# Patient Record
Sex: Female | Born: 1990 | Race: Black or African American | Hispanic: No | Marital: Single | Smoking: Never smoker
Health system: Southern US, Community
[De-identification: ages and names within clinical notes are randomized; demographics above are authoritative.]

## PROBLEM LIST (undated history)

## (undated) ENCOUNTER — Inpatient Hospital Stay (HOSPITAL_COMMUNITY): Payer: Self-pay

## (undated) ENCOUNTER — Ambulatory Visit (HOSPITAL_COMMUNITY): Source: Home / Self Care

## (undated) DIAGNOSIS — F909 Attention-deficit hyperactivity disorder, unspecified type: Secondary | ICD-10-CM

## (undated) DIAGNOSIS — J45909 Unspecified asthma, uncomplicated: Secondary | ICD-10-CM

## (undated) DIAGNOSIS — A549 Gonococcal infection, unspecified: Secondary | ICD-10-CM

## (undated) DIAGNOSIS — A749 Chlamydial infection, unspecified: Secondary | ICD-10-CM

## (undated) DIAGNOSIS — B9689 Other specified bacterial agents as the cause of diseases classified elsewhere: Secondary | ICD-10-CM

## (undated) DIAGNOSIS — K219 Gastro-esophageal reflux disease without esophagitis: Secondary | ICD-10-CM

## (undated) DIAGNOSIS — R519 Headache, unspecified: Secondary | ICD-10-CM

## (undated) DIAGNOSIS — R1110 Vomiting, unspecified: Secondary | ICD-10-CM

## (undated) DIAGNOSIS — N760 Acute vaginitis: Secondary | ICD-10-CM

## (undated) HISTORY — PX: NO PAST SURGERIES: SHX2092

---
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
## 2011-01-06 ENCOUNTER — Inpatient Hospital Stay (INDEPENDENT_AMBULATORY_CARE_PROVIDER_SITE_OTHER)
Admission: RE | Admit: 2011-01-06 | Discharge: 2011-01-06 | Disposition: A | Discharge status: Home / Self Care | Payer: Medicaid Other | Source: Ambulatory Visit | Attending: Family Medicine | Admitting: Family Medicine

## 2011-01-06 ENCOUNTER — Ambulatory Visit (INDEPENDENT_AMBULATORY_CARE_PROVIDER_SITE_OTHER): Payer: Medicaid Other

## 2011-01-06 DIAGNOSIS — S61209A Unspecified open wound of unspecified finger without damage to nail, initial encounter: Secondary | ICD-10-CM

## 2011-04-20 ENCOUNTER — Inpatient Hospital Stay (INDEPENDENT_AMBULATORY_CARE_PROVIDER_SITE_OTHER)
Admission: RE | Admit: 2011-04-20 | Discharge: 2011-04-20 | Disposition: A | Discharge status: Home / Self Care | Payer: Medicaid Other | Source: Ambulatory Visit | Attending: Family Medicine | Admitting: Family Medicine

## 2011-04-20 DIAGNOSIS — H6120 Impacted cerumen, unspecified ear: Secondary | ICD-10-CM

## 2011-04-20 DIAGNOSIS — I809 Phlebitis and thrombophlebitis of unspecified site: Secondary | ICD-10-CM

## 2011-04-20 DIAGNOSIS — R609 Edema, unspecified: Secondary | ICD-10-CM

## 2011-04-20 DIAGNOSIS — T169XXA Foreign body in ear, unspecified ear, initial encounter: Secondary | ICD-10-CM

## 2011-04-20 LAB — POCT I-STAT, CHEM 8
Calcium, Ion: 1.13 mmol/L (ref 1.12–1.32)
HCT: 46 % (ref 36.0–46.0)
TCO2: 25 mmol/L (ref 0–100)

## 2012-04-25 ENCOUNTER — Emergency Department (HOSPITAL_COMMUNITY)
Admission: EM | Admit: 2012-04-25 | Discharge: 2012-04-25 | Disposition: A | Discharge status: Home / Self Care | Payer: Self-pay | Attending: Emergency Medicine | Admitting: Emergency Medicine

## 2012-04-25 ENCOUNTER — Emergency Department (HOSPITAL_COMMUNITY): Payer: Self-pay

## 2012-04-25 DIAGNOSIS — E860 Dehydration: Secondary | ICD-10-CM | POA: Insufficient documentation

## 2012-04-25 DIAGNOSIS — R55 Syncope and collapse: Secondary | ICD-10-CM | POA: Insufficient documentation

## 2012-04-25 DIAGNOSIS — S0990XA Unspecified injury of head, initial encounter: Secondary | ICD-10-CM | POA: Insufficient documentation

## 2012-04-25 DIAGNOSIS — W1809XA Striking against other object with subsequent fall, initial encounter: Secondary | ICD-10-CM | POA: Insufficient documentation

## 2012-04-25 LAB — URINALYSIS, ROUTINE W REFLEX MICROSCOPIC
Ketones, ur: NEGATIVE mg/dL
Leukocytes, UA: NEGATIVE
Nitrite: NEGATIVE
Protein, ur: NEGATIVE mg/dL
Urobilinogen, UA: 0.2 mg/dL (ref 0.0–1.0)

## 2012-04-25 LAB — BASIC METABOLIC PANEL
BUN: 8 mg/dL (ref 6–23)
GFR calc Af Amer: >90 mL/min (ref >90)
GFR calc non Af Amer: >90 mL/min (ref >90)
Potassium: 3.4 mEq/L — ABNORMAL LOW (ref 3.5–5.1)
Sodium: 137 mEq/L (ref 135–145)

## 2012-04-25 LAB — CBC WITH DIFFERENTIAL/PLATELET
Basophils Relative: 0 % (ref 0–1)
Eosinophils Absolute: 0.3 10*3/uL (ref 0.0–0.7)
MCH: 27.4 pg (ref 26.0–34.0)
MCHC: 33.6 g/dL (ref 30.0–36.0)
Monocytes Relative: 5 % (ref 3–12)
Neutrophils Relative %: 46 % (ref 43–77)
Platelets: 295 10*3/uL (ref 150–400)

## 2012-04-25 MED ORDER — ACETAMINOPHEN 325 MG PO TABS
650.0000 mg | ORAL_TABLET | Freq: Once | ORAL | Status: AC
  Administered 2012-04-25: 650 mg via ORAL
  Filled 2012-04-25: qty 2

## 2012-04-25 MED ORDER — ONDANSETRON 8 MG PO TBDP
8.0000 mg | ORAL_TABLET | Freq: Once | ORAL | Status: AC
  Administered 2012-04-25: 8 mg via ORAL
  Filled 2012-04-25: qty 1

## 2012-04-25 NOTE — ED Notes (Signed)
Pt at home, sitting on the patio outside, Passed out and hit the back of her head on concrete. LOC briefly. Pt alert upon [facility] arrival. Alert and oriented x 4. No hematoma or active bleeding found by EMS. VS: 142/76, HR 88 NSR, RR 16. Pt reports decrease in fluid intake and has only eaten one meal around 4pm yesterday. Pt is  Allergic to shellfish.

## 2012-04-25 NOTE — ED Notes (Signed)
AVW:[id number]> Expected date:<BR> Expected time:<BR> Means of arrival:<BR> Comments:<BR> EMS/21 year old-syncopal episode-fell and hit head

## 2012-04-25 NOTE — ED Provider Notes (Addendum)
History     CSN: [id number]  Arrival date & time 04/25/12  0010   First MD Initiated Contact with Patient 04/25/12 0018      Chief Complaint  Patient presents with  . Loss of Consciousness    (Consider location/radiation/quality/duration/timing/severity/associated sxs/prior treatment) HPI 21 year old female presents to emergency room complaining of syncope. Patient reports after returning to the movies, she was carrying in her nephew when she suddenly felt weak. Another family member took the child from her, she then reports she had episode of syncope where she fell forward onto her knees and then hit her head on the floor. She reports waking up with the ambulance crew around her. Patient reports prior history of syncope in relation to dehydration. Patient reports over the last 24 hours she's only eaten once. Family reports she's not been drinking that well either. Patient currently complaining of headache and slight nausea. She reports she is hungry. Patient reports she had onset of headache today around 3:30/4:00, and tried to eat something at that time which did help her headache somewhat.  Patient denies chest pain, shortness of breath neck pain, abdominal pain dysuria. Her last menstrual period was 2-3 years ago. Patient has contraceptive implant in place and does not have current menses.  No past medical history on file.  No past surgical history on file.  No family history on file.  History  Substance Use Topics  . Smoking status: Not on file  . Smokeless tobacco: Not on file  . Alcohol Use: Not on file    OB History    No data available      Review of Systems  All other systems reviewed and are negative.    Allergies  Shellfish allergy  Home Medications   Current Outpatient Rx  Name Route Sig Dispense Refill  . DOXYCYCLINE HYCLATE 100 MG PO TABS Oral Take 100 mg by mouth 2 (two) times daily.    . ETONOGESTREL 68 MG [place] IMPL Subcutaneous Inject 1 each into the  skin once.      BP 131/77  Pulse 99  Temp 98.7 F (37.1 C) (Oral)  Resp 16  Ht 5\' 6"  (1.676 m)  Wt 210 lb (95.255 kg)  BMI 33.89 kg/m2  SpO2 100%  Physical Exam  Nursing note and vitals reviewed. Constitutional: She is oriented to person, place, and time. She appears well-developed and well-nourished.  HENT:  Head: Normocephalic and atraumatic.  Nose: Nose normal.       Dry mucous membranes noted  Eyes: Conjunctivae and EOM are normal. Pupils are equal, round, and reactive to light.  Neck: Normal range of motion. Neck supple. No JVD present. No tracheal deviation present. No thyromegaly present.       Patient in c-collar, CT scan negative, collar replaced and patient with normal range of motion movement without difficulty  Cardiovascular: Normal rate, regular rhythm, normal heart sounds and intact distal pulses.  Exam reveals no gallop and no friction rub.   No murmur heard. Pulmonary/Chest: Effort normal and breath sounds normal. No stridor. No respiratory distress. She has no wheezes. She has no rales. She exhibits no tenderness.  Abdominal: Soft. Bowel sounds are normal. She exhibits no distension and no mass. There is no tenderness. There is no rebound and no guarding.  Musculoskeletal: Normal range of motion. She exhibits no edema and no tenderness.  Lymphadenopathy:    She has no cervical adenopathy.  Neurological: She is alert and oriented to person, place, and time. She  has normal reflexes. No cranial nerve deficit. She exhibits normal muscle tone. Coordination normal.  Skin: Skin is dry. No rash noted. No erythema. No pallor.  Psychiatric: She has a normal mood and affect. Her behavior is normal. Judgment and thought content normal.    ED Course  Procedures (including critical care time)  Labs Reviewed  URINALYSIS, ROUTINE W REFLEX MICROSCOPIC - Abnormal; Notable for the following:    APPearance CLOUDY (*)     All other components within normal limits  PREGNANCY,  URINE  CBC WITH DIFFERENTIAL  BASIC METABOLIC PANEL   Ct Head Wo Contrast  04/25/2012  *RADIOLOGY REPORT*  Clinical Data:  Syncope with loss of consciousness and head injury.  CT HEAD WITHOUT CONTRAST CT CERVICAL SPINE WITHOUT CONTRAST  Technique:  Multidetector CT imaging of the head and cervical spine was performed following the standard protocol without intravenous contrast.  Multiplanar CT image reconstructions of the cervical spine were also generated.  Comparison:   None  CT HEAD  Findings: The brain demonstrates no evidence of hemorrhage, infarction, edema, mass effect, extra-axial fluid collection, hydrocephalus or mass lesion.  The skull is unremarkable.  IMPRESSION: Normal CT of head.  CT CERVICAL SPINE  Findings: The cervical spine shows normal alignment and no evidence of fracture or subluxation.  No significant degenerative changes. No soft tissue swelling.  The visualized airway is normally patent.  IMPRESSION: Normal CT of cervical spine.  Original Report Authenticated By: Reola Calkins, M.D.   Ct Cervical Spine Wo Contrast  04/25/2012  *RADIOLOGY REPORT*  Clinical Data:  Syncope with loss of consciousness and head injury.  CT HEAD WITHOUT CONTRAST CT CERVICAL SPINE WITHOUT CONTRAST  Technique:  Multidetector CT imaging of the head and cervical spine was performed following the standard protocol without intravenous contrast.  Multiplanar CT image reconstructions of the cervical spine were also generated.  Comparison:   None  CT HEAD  Findings: The brain demonstrates no evidence of hemorrhage, infarction, edema, mass effect, extra-axial fluid collection, hydrocephalus or mass lesion.  The skull is unremarkable.  IMPRESSION: Normal CT of head.  CT CERVICAL SPINE  Findings: The cervical spine shows normal alignment and no evidence of fracture or subluxation.  No significant degenerative changes. No soft tissue swelling.  The visualized airway is normally patent.  IMPRESSION: Normal CT of  cervical spine.  Original Report Authenticated By: Reola Calkins, M.D.    Date: 04/25/2012  Rate: 80  Rhythm: normal sinus rhythm  QRS Axis: normal  Intervals: normal  ST/T Wave abnormalities: normal  Conduction Disutrbances:none  Narrative Interpretation:   Old EKG Reviewed: none available    1. Syncope   2. Dehydration       MDM  21 year old female with syncope. Workup here unremarkable, patient feeling better. Patient does appear to have mild dehydration on exam and has not eaten well. Patient encouraged to eat and drink regularly especially with the hot weather.        Olivia Mackie, MD 04/25/12 7829  Olivia Mackie, MD 04/25/12 [phone]

## 2013-02-13 ENCOUNTER — Encounter: Payer: Self-pay | Admitting: Obstetrics

## 2013-02-13 ENCOUNTER — Ambulatory Visit (INDEPENDENT_AMBULATORY_CARE_PROVIDER_SITE_OTHER): Payer: BC Managed Care – PPO | Admitting: Obstetrics

## 2013-02-13 VITALS — BP 104/66 | HR 91 | Temp 97.3°F | Wt 202.0 lb

## 2013-02-13 DIAGNOSIS — L739 Follicular disorder, unspecified: Secondary | ICD-10-CM | POA: Insufficient documentation

## 2013-02-13 DIAGNOSIS — Z3200 Encounter for pregnancy test, result unknown: Secondary | ICD-10-CM

## 2013-02-13 DIAGNOSIS — R3915 Urgency of urination: Secondary | ICD-10-CM

## 2013-02-13 DIAGNOSIS — Z01419 Encounter for gynecological examination (general) (routine) without abnormal findings: Secondary | ICD-10-CM

## 2013-02-13 DIAGNOSIS — Z113 Encounter for screening for infections with a predominantly sexual mode of transmission: Secondary | ICD-10-CM

## 2013-02-13 DIAGNOSIS — Z309 Encounter for contraceptive management, unspecified: Secondary | ICD-10-CM

## 2013-02-13 DIAGNOSIS — N760 Acute vaginitis: Secondary | ICD-10-CM

## 2013-02-13 DIAGNOSIS — L738 Other specified follicular disorders: Secondary | ICD-10-CM

## 2013-02-13 HISTORY — DX: Follicular disorder, unspecified: L73.9

## 2013-02-13 NOTE — Progress Notes (Signed)
Subjective:    Deanna Myers is a 22 y.o. female who presents for contraception counseling. The patient has no complaints today. The patient is sexually active. Pertinent past medical history: sexually transmitted diseases.  Menstrual History: OB History   Grav Para Term Preterm Abortions TAB SAB Ect Mult Living                  Menarche age: 43  No LMP recorded. Patient has had an implant.    The following portions of the patient's history were reviewed and updated as appropriate: allergies, current medications, past family history, past medical history, past social history, past surgical history and problem list.  Review of Systems Pertinent items are noted in HPI.   Objective:    General appearance: alert and no distress Breasts: normal appearance, no masses or tenderness Abdomen: normal findings: soft, non-tender Pelvic: cervix normal in appearance, external genitalia normal, no adnexal masses or tenderness, no cervical motion tenderness, uterus normal size, shape, and consistency and vagina normal without discharge   Assessment:    22 y.o., continuing Depo-Provera injections, no contraindications.   Plan:    All questions answered.  Schedule removal.

## 2013-02-14 ENCOUNTER — Encounter: Payer: Self-pay | Admitting: Obstetrics

## 2013-02-14 LAB — PAP IG W/ RFLX HPV ASCU

## 2013-02-14 LAB — WET PREP BY MOLECULAR PROBE: Candida species: NEGATIVE

## 2013-02-20 ENCOUNTER — Encounter: Payer: Self-pay | Admitting: Obstetrics

## 2013-02-20 ENCOUNTER — Ambulatory Visit (INDEPENDENT_AMBULATORY_CARE_PROVIDER_SITE_OTHER): Payer: BC Managed Care – PPO | Admitting: Obstetrics

## 2013-02-20 VITALS — BP 117/79 | HR 83 | Temp 98.6°F | Wt 203.0 lb

## 2013-02-20 DIAGNOSIS — Z3046 Encounter for surveillance of implantable subdermal contraceptive: Secondary | ICD-10-CM

## 2013-02-20 NOTE — Patient Instructions (Signed)
Routine

## 2013-02-20 NOTE — Progress Notes (Signed)
Nexplanon Removal:      Rod palpated and anesthetized at the tip with 1% plain xylocaine.  The skin sterilely prepped and an incision made over the tip, and the rod was grasped with       A mosquito forcep and the rod removed, intact.  Skin closed with one interrupted suture of 4-0 Vicryl.  Bandage applied.

## 2013-02-20 NOTE — Progress Notes (Deleted)
IMPLANON REMOVAL NOTE  Pt had device inserted 09/ 2012  Contraception used: Implanon  Pregnancy test result:  Lab Results  Component Value Date   PREGTESTUR NEGATIVE 04/25/2012    Indications:  The patient desires contraception.  She understands risks, benefits, and alternatives to Implanon and would like to proceed.  Anesthesia:   Lidocaine 1% plain.  Procedure:  A time-out was performed confirming the procedure and the patient's allergy status.  The patient's non-dominant was identified as the {left/right:311354} arm.  The protection cap was removed. While placing countertraction on the skin, the needle was inserted at a 30 degree angle.  The applicator was held horizontal to the skin; the skin was tented upward as the needle was introduced into the subdermal space.  While holding the applicator in place, the slider was unlocked. The Nexplanon was removed from the field.  The Nexplanon was palpated to ensure proper placement.  Complications: {Mis complications:31741}  Instructions:  The patient was instructed to remove the dressing in 24 hours and that some bruising is to be expected.  She was advised to use over the counter analgesics as needed for any pain at the site.  She is to keep the area dry for 24 hours and to call if her hand or arm becomes cold, numb, or blue.  Return visit:  Return in {1-6 weeks:20341}

## 2013-03-06 ENCOUNTER — Ambulatory Visit: Payer: BC Managed Care – PPO | Admitting: Obstetrics

## 2013-03-06 ENCOUNTER — Encounter: Payer: Self-pay | Admitting: *Deleted

## 2013-03-13 ENCOUNTER — Ambulatory Visit: Payer: BC Managed Care – PPO

## 2013-06-05 ENCOUNTER — Inpatient Hospital Stay (HOSPITAL_COMMUNITY)
Admission: AD | Admit: 2013-06-05 | Discharge: 2013-06-05 | Disposition: A | Discharge status: Home / Self Care | Payer: BC Managed Care – PPO | Source: Ambulatory Visit | Attending: Obstetrics | Admitting: Obstetrics

## 2013-06-05 DIAGNOSIS — O211 Hyperemesis gravidarum with metabolic disturbance: Secondary | ICD-10-CM

## 2013-06-05 DIAGNOSIS — E860 Dehydration: Secondary | ICD-10-CM | POA: Insufficient documentation

## 2013-06-05 DIAGNOSIS — O210 Mild hyperemesis gravidarum: Secondary | ICD-10-CM | POA: Diagnosis present

## 2013-06-05 DIAGNOSIS — O219 Vomiting of pregnancy, unspecified: Secondary | ICD-10-CM

## 2013-06-05 LAB — URINALYSIS, ROUTINE W REFLEX MICROSCOPIC
Nitrite: NEGATIVE
Protein, ur: NEGATIVE mg/dL
Specific Gravity, Urine: >1.03 — ABNORMAL HIGH (ref 1.005–1.030)
Urobilinogen, UA: 1 mg/dL (ref 0.0–1.0)

## 2013-06-05 LAB — URINE MICROSCOPIC-ADD ON

## 2013-06-05 LAB — POCT PREGNANCY, URINE: Preg Test, Ur: POSITIVE — AB

## 2013-06-05 MED ORDER — PROMETHAZINE HCL 25 MG RE SUPP: 25.0000 mg | Freq: Four times a day (QID) | RECTAL | Status: DC | PRN

## 2013-06-05 MED ORDER — PROMETHAZINE HCL 25 MG/ML IJ SOLN
25.0000 mg | Freq: Once | INTRAVENOUS | Status: AC
  Administered 2013-06-05: 25 mg via INTRAVENOUS
  Filled 2013-06-05: qty 1

## 2013-06-05 MED ORDER — DOXYLAMINE-PYRIDOXINE 10-10 MG PO TBEC: 1.0000 | DELAYED_RELEASE_TABLET | Freq: Two times a day (BID) | ORAL | Status: DC

## 2013-06-05 MED ORDER — LACTATED RINGERS IV BOLUS (SEPSIS)
1000.0000 mL | Freq: Once | INTRAVENOUS | Status: DC
Start: 2013-06-05 — End: 2013-06-06

## 2013-06-05 NOTE — MAU Provider Note (Signed)
Chief Complaint: No chief complaint on file.   First Provider Initiated Contact with Patient 06/05/13 1712     SUBJECTIVE HPI: Deanna Myers is a 22 y.o. G1 at [redacted]w[redacted]d by LMP who presents with nausea/ vomiting without retaining any food or fluids for over 24 hours. She has tried Zofran but it makes her vomit. Prenatal care with Dr. Gaynell Face.  No past medical history on file. OB History  No data available   Past Surgical History  Procedure Laterality Date  . No past surgeries     History   Social History  . Marital Status: Single    Spouse Name: N/A    Number of Children: 0  . Years of Education: N/A   Occupational History  . Cook    Social History Main Topics  . Smoking status: Never Smoker   . Smokeless tobacco: Never Used  . Alcohol Use: No  . Drug Use: No  . Sexual Activity: Yes    Partners: Male    Birth Control/ Protection: Implant   Other Topics Concern  . Not on file   Social History Narrative  . No narrative on file   No current facility-administered medications on file prior to encounter.   No current outpatient prescriptions on file prior to encounter.   Allergies  Allergen Reactions  . Shellfish Allergy Anaphylaxis  . Betadine [Povidone Iodine] Rash    ROS: Pertinent items in HPI  OBJECTIVE There were no vitals taken for this visit.  GENERAL: Well-developed, well-nourished female in no acute distress.  HEENT: Normocephalic HEART: normal rate RESP: normal effort ABDOMEN: Soft, non-tender EXTREMITIES: Nontender, no edema NEURO: Alert and oriented  LAB RESULTS Results for orders placed during the hospital encounter of 06/05/13 (from the past 24 hour(s))  URINALYSIS, ROUTINE W REFLEX MICROSCOPIC     Status: Abnormal   Collection Time    06/05/13  4:10 PM      Result Value Range   Color, Urine ORANGE (*) YELLOW   APPearance CLOUDY (*) CLEAR   Specific Gravity, Urine >1.030 (*) 1.005 - 1.030   pH 6.0  5.0 - 8.0   Glucose, UA NEGATIVE   NEGATIVE mg/dL   Hgb urine dipstick NEGATIVE  NEGATIVE   Bilirubin Urine NEGATIVE  NEGATIVE   Ketones, ur >80 (*) NEGATIVE mg/dL   Protein, ur NEGATIVE  NEGATIVE mg/dL   Urobilinogen, UA 1.0  0.0 - 1.0 mg/dL   Nitrite NEGATIVE  NEGATIVE   Leukocytes, UA TRACE (*) NEGATIVE  URINE MICROSCOPIC-ADD ON     Status: Abnormal   Collection Time    06/05/13  4:10 PM      Result Value Range   Squamous Epithelial / LPF MANY (*) RARE   WBC, UA 0-2  <3 WBC/hpf   Bacteria, UA MANY (*) RARE  POCT PREGNANCY, URINE     Status: Abnormal   Collection Time    06/05/13  5:13 PM      Result Value Range   Preg Test, Ur POSITIVE (*) NEGATIVE    IMAGING No results found.  MAU COURSE IV LR 1000 with Phenergan 25 mg  ASSESSMENT 1. Nausea and vomiting in pregnancy prior to [redacted] weeks gestation   2. Dehydration, moderate   G1 at 7+wks  PLAN     Medication List         Doxylamine-Pyridoxine 10-10 MG Tbec  Commonly known as:  DICLEGIS  Take 1 tablet by mouth 2 (two) times daily.     ondansetron 4  MG disintegrating tablet  Commonly known as:  ZOFRAN-ODT  Take 4 mg by mouth every 4 (four) hours as needed for nausea.     prenatal multivitamin Tabs tablet  Take 1 tablet by mouth daily at 12 noon.     promethazine 25 MG suppository  Commonly known as:  PHENERGAN  Place 1 suppository (25 mg total) rectally every 6 (six) hours as needed for nausea.      Care assumed by Thressa Sheller CNM at 2100   Deanna Myers, CNM 06/05/2013  5:12 PM

## 2013-06-16 ENCOUNTER — Encounter (HOSPITAL_COMMUNITY): Payer: Self-pay | Admitting: Family

## 2013-06-16 ENCOUNTER — Inpatient Hospital Stay (HOSPITAL_COMMUNITY)
Admission: AD | Admit: 2013-06-16 | Discharge: 2013-06-16 | Disposition: A | Discharge status: Home / Self Care | Payer: BC Managed Care – PPO | Source: Ambulatory Visit | Attending: Obstetrics | Admitting: Obstetrics

## 2013-06-16 DIAGNOSIS — O210 Mild hyperemesis gravidarum: Secondary | ICD-10-CM | POA: Insufficient documentation

## 2013-06-16 DIAGNOSIS — N949 Unspecified condition associated with female genital organs and menstrual cycle: Secondary | ICD-10-CM | POA: Insufficient documentation

## 2013-06-16 DIAGNOSIS — O219 Vomiting of pregnancy, unspecified: Secondary | ICD-10-CM

## 2013-06-16 HISTORY — DX: Chlamydial infection, unspecified: A74.9

## 2013-06-16 HISTORY — DX: Unspecified asthma, uncomplicated: J45.909

## 2013-06-16 HISTORY — DX: Other specified bacterial agents as the cause of diseases classified elsewhere: B96.89

## 2013-06-16 HISTORY — DX: Acute vaginitis: N76.0

## 2013-06-16 HISTORY — DX: Gonococcal infection, unspecified: A54.9

## 2013-06-16 LAB — URINALYSIS, ROUTINE W REFLEX MICROSCOPIC
Bilirubin Urine: NEGATIVE
Hgb urine dipstick: NEGATIVE
Specific Gravity, Urine: 1.01 (ref 1.005–1.030)
Urobilinogen, UA: 0.2 mg/dL (ref 0.0–1.0)

## 2013-06-16 LAB — WET PREP, GENITAL: Trich, Wet Prep: NONE SEEN

## 2013-06-16 MED ORDER — PROMETHAZINE HCL 25 MG/ML IJ SOLN
25.0000 mg | Freq: Once | INTRAVENOUS | Status: AC
  Administered 2013-06-16: 25 mg via INTRAVENOUS
  Filled 2013-06-16: qty 1

## 2013-06-16 MED ORDER — LACTATED RINGERS IV BOLUS (SEPSIS)
1000.0000 mL | Freq: Once | INTRAVENOUS | Status: AC
  Administered 2013-06-16: 1000 mL via INTRAVENOUS

## 2013-06-16 MED ORDER — ONDANSETRON 8 MG/NS 50 ML IVPB
8.0000 mg | Freq: Once | INTRAVENOUS | Status: AC
  Administered 2013-06-16: 8 mg via INTRAVENOUS
  Filled 2013-06-16: qty 8

## 2013-06-16 NOTE — MAU Note (Signed)
Patient presents to MAU with c/o n/v progressively worsening today. Reports emesis >10 occurrences in last 24 hours; also reports intermittent bilateral leg numbness noted after vomiting began at work today.  Reports numbness unaffected by positional changes, only worsens with N/V. Reports symptoms unrelieved by phenergan or Zofran RXs. Last dose of Phenergan 25mg  suppository at 1230 today; 2 Vit B pops earlier. No Zofran in 24 hrs.  Denies VB, LOF, contractions.

## 2013-06-16 NOTE — MAU Provider Note (Signed)
History     CSN: [id number]  Arrival date and time: 06/16/13 1553   First Provider Initiated Contact with Patient 06/16/13 1640      Chief Complaint  Patient presents with  . Hyperemesis Gravidarum   HPI  [name] is a 22 y.o. female G1P0 at [redacted]w[redacted]d who presents with N/V in pregnancy. Took phenergan at 1230, tried to eat some pretzels, drank some water and juice and vomited it all back up. She has vomited at least 7 times today. She is not taking the zofran because the ODT tabs make her feel sick to her stomach. She has some abdominal discomfort right below her umbilicus; this pain is not new, she has experienced it the whole pregnancy. She is also experiencing small amount of white vaginal discharge, non odorous; this is a new finding. She has been seen in Dr. Elsie Stain office and has an [name] scheduled for October.    OB History   Grav Para Term Preterm Abortions TAB SAB Ect Mult Living   1               Past Medical History  Diagnosis Date  . Asthma   . BV (bacterial vaginosis)   . Gonorrhea   . Chlamydia     Past Surgical History  Procedure Laterality Date  . No past surgeries      Family History  Problem Relation Age of Onset  . Hypertension Mother   . Congestive Heart Failure Mother   . Diabetes Paternal Grandmother   . Cervical cancer Paternal Aunt     History  Substance Use Topics  . Smoking status: Never Smoker   . Smokeless tobacco: Never Used  . Alcohol Use: No    Allergies:  Allergies  Allergen Reactions  . Shellfish Allergy Anaphylaxis  . Betadine [Povidone Iodine] Rash    Prescriptions prior to admission  Medication Sig Dispense Refill  . Doxylamine-Pyridoxine (DICLEGIS) 10-10 MG TBEC Take 1 tablet by mouth 2 (two) times daily.  60 tablet  0  . ondansetron (ZOFRAN-ODT) 4 MG disintegrating tablet Take 4 mg by mouth every 4 (four) hours as needed for nausea.      . Prenatal Vit-Fe Fumarate-FA (PRENATAL MULTIVITAMIN) TABS tablet Take  1 tablet by mouth daily at 12 noon.      . promethazine (PHENERGAN) 25 MG suppository Place 1 suppository (25 mg total) rectally every 6 (six) hours as needed for nausea.  12 each  0   Results for orders placed during the hospital encounter of 06/16/13 (from the past 24 hour(s))  URINALYSIS, ROUTINE W REFLEX MICROSCOPIC     Status: Abnormal   Collection Time    06/16/13  6:58 PM      Result Value Range   Color, Urine YELLOW  YELLOW   APPearance CLEAR  CLEAR   Specific Gravity, Urine 1.010  1.005 - 1.030   pH 6.5  5.0 - 8.0   Glucose, UA NEGATIVE  NEGATIVE mg/dL   Hgb urine dipstick NEGATIVE  NEGATIVE   Bilirubin Urine NEGATIVE  NEGATIVE   Ketones, ur 15 (*) NEGATIVE mg/dL   Protein, ur NEGATIVE  NEGATIVE mg/dL   Urobilinogen, UA 0.2  0.0 - 1.0 mg/dL   Nitrite NEGATIVE  NEGATIVE   Leukocytes, UA NEGATIVE  NEGATIVE  WET PREP, GENITAL     Status: Abnormal   Collection Time    06/16/13  7:13 PM      Result Value Range   Yeast Wet Prep HPF  POC NONE SEEN  NONE SEEN   Trich, Wet Prep NONE SEEN  NONE SEEN   Clue Cells Wet Prep HPF POC FEW (*) NONE SEEN   WBC, Wet Prep HPF POC FEW (*) NONE SEEN    Review of Systems  Constitutional: Negative for fever and chills.  Gastrointestinal: Positive for nausea, vomiting, abdominal pain and diarrhea. Negative for constipation.       Upper abdominal pain that is not new; pain right below her umbilicus   Genitourinary: Negative for dysuria and urgency.       + Vaginal discharge. No vaginal bleeding. No dysuria.   Neurological: Positive for dizziness and headaches.   Physical Exam   Blood pressure 103/52, pulse 86, temperature 97.5 F (36.4 C), temperature source Oral, resp. rate 16, last menstrual period 04/16/2013.  Physical Exam  Constitutional: She is oriented to person, place, and time. She appears well-developed and well-nourished. No distress.  Neck: Neck supple.  Respiratory: Effort normal.  GI: Soft. She exhibits no distension.  There is tenderness.  Tenderness at umbilicus   Genitourinary: Vaginal discharge found.  Speculum exam: Vagina - Moderate amount of creamy white discharge, no odor Cervix - No contact bleeding Bimanual exam: Cervix closed Uterus non tender, normal size for gestational age  Adnexa non tender, no masses bilaterally GC/Chlam, wet prep done Chaperone present for exam.   Neurological: She is alert and oriented to person, place, and time.  Skin: Skin is warm. She is not diaphoretic.    MAU Course  Procedures  MDM UA Wet prep GC/chlamydia- pending  LR bolus Zofran 8 mg IVPB Phenergan 25 mg in D5LR  Pt tolerated PO fluids and a cracker.  Report given to [facility] CNM who resumes care of the patient at [facility] IRENE FNP-C 06/16/2013, 8:05 PM   2110 Pt reports doing well after antiemetic; eating crackers and drinking soda without difficulty.  Desires discharge home.  Assessment and Plan  G1P0 at [redacted]w[redacted]d wks IUP Nausea and Vomiting in Pregnancy  Plan: Discharge to home Keep scheduled appointment.  [facility] 06/16/13  2113 PM

## 2013-06-17 LAB — GC/CHLAMYDIA PROBE AMP
CT Probe RNA: NEGATIVE
GC Probe RNA: NEGATIVE

## 2013-06-21 ENCOUNTER — Encounter (HOSPITAL_COMMUNITY): Payer: Self-pay

## 2013-06-21 ENCOUNTER — Inpatient Hospital Stay (HOSPITAL_COMMUNITY)
Admission: AD | Admit: 2013-06-21 | Discharge: 2013-06-25 | DRG: 886 | Disposition: A | Discharge status: Home / Self Care | Payer: BC Managed Care – PPO | Source: Ambulatory Visit | Attending: Obstetrics | Admitting: Obstetrics

## 2013-06-21 DIAGNOSIS — E860 Dehydration: Secondary | ICD-10-CM | POA: Diagnosis present

## 2013-06-21 DIAGNOSIS — O211 Hyperemesis gravidarum with metabolic disturbance: Secondary | ICD-10-CM | POA: Diagnosis present

## 2013-06-21 DIAGNOSIS — O210 Mild hyperemesis gravidarum: Secondary | ICD-10-CM

## 2013-06-21 LAB — URINALYSIS, ROUTINE W REFLEX MICROSCOPIC
Ketones, ur: >80 mg/dL — AB
Nitrite: NEGATIVE
Protein, ur: 100 mg/dL — AB
Specific Gravity, Urine: >1.03 — ABNORMAL HIGH (ref 1.005–1.030)
Urobilinogen, UA: 1 mg/dL (ref 0.0–1.0)

## 2013-06-21 MED ORDER — M.V.I. ADULT IV INJ
Freq: Once | INTRAVENOUS | Status: AC
  Administered 2013-06-21: 22:00:00 via INTRAVENOUS
  Filled 2013-06-21: qty 1000

## 2013-06-21 MED ORDER — M.V.I. ADULT IV INJ
Freq: Once | INTRAVENOUS | Status: DC
  Filled 2013-06-21: qty 1000

## 2013-06-21 MED ORDER — FAMOTIDINE IN NACL 20-0.9 MG/50ML-% IV SOLN
20.0000 mg | Freq: Once | INTRAVENOUS | Status: AC
  Administered 2013-06-21: 20 mg via INTRAVENOUS
  Filled 2013-06-21: qty 50

## 2013-06-21 MED ORDER — M.V.I. ADULT IV INJ
INTRAVENOUS | Status: DC
  Administered 2013-06-22: 21:00:00 via INTRAVENOUS
  Filled 2013-06-21: qty 10

## 2013-06-21 MED ORDER — PANTOPRAZOLE SODIUM 40 MG IV SOLR
40.0000 mg | Freq: Two times a day (BID) | INTRAVENOUS | Status: DC
  Administered 2013-06-22 – 2013-06-23 (×4): 40 mg via INTRAVENOUS
  Filled 2013-06-21 (×4): qty 40

## 2013-06-21 MED ORDER — PROMETHAZINE HCL 25 MG/ML IJ SOLN
25.0000 mg | Freq: Once | INTRAVENOUS | Status: AC
  Administered 2013-06-21: 25 mg via INTRAVENOUS
  Filled 2013-06-21: qty 1

## 2013-06-21 MED ORDER — LACTATED RINGERS IV SOLN
INTRAVENOUS | Status: DC
  Administered 2013-06-22 – 2013-06-23 (×4): via INTRAVENOUS

## 2013-06-21 MED ORDER — ONDANSETRON 8 MG/NS 50 ML IVPB
8.0000 mg | Freq: Four times a day (QID) | INTRAVENOUS | Status: DC
  Administered 2013-06-22 – 2013-06-23 (×6): 8 mg via INTRAVENOUS
  Filled 2013-06-21 (×7): qty 8

## 2013-06-21 MED ORDER — ONDANSETRON HCL 4 MG/2ML IJ SOLN: 8.0000 mg | Freq: Four times a day (QID) | INTRAMUSCULAR | Status: DC

## 2013-06-21 NOTE — MAU Note (Signed)
Patient states she has had a 30 # weight loss since the pregnancy started. RN witnessed patient spitting in emesis bag, no emesis presently. Ice chips given.

## 2013-06-21 NOTE — MAU Provider Note (Signed)
History     CSN: [id number]  Arrival date and time: 06/21/13 1831   First Provider Initiated Contact with Patient 06/21/13 1947      Chief Complaint  Patient presents with  . Emesis During Pregnancy   HPI  [name] is a 22 y.o. female G1P0 at [redacted]w[redacted]d who presents with persistent vomiting. This is her 3rd visit into the ER for N/V in pregnancy. She was seen in Dr. Elsie Stain yesterday and discussed with him the possibility of being admitted to the hospital for re-hydration. She is unable to keep anything down; she has tried water, ginger ale, [place] sandwhich, mandarin oranges and has vomited it all up. She has an appetite and wants to eat, however everything comes back up. She was seen by Dr. Gaynell Face today, and states that he told her if she had to come back for fluids he would admit her.   OB History   Grav Para Term Preterm Abortions TAB SAB Ect Mult Living   1               Past Medical History  Diagnosis Date  . Asthma   . BV (bacterial vaginosis)   . Gonorrhea   . Chlamydia     Past Surgical History  Procedure Laterality Date  . No past surgeries      Family History  Problem Relation Age of Onset  . Hypertension Mother   . Congestive Heart Failure Mother   . Diabetes Paternal Grandmother   . Cervical cancer Paternal Aunt     History  Substance Use Topics  . Smoking status: Never Smoker   . Smokeless tobacco: Never Used  . Alcohol Use: No    Allergies:  Allergies  Allergen Reactions  . Shellfish Allergy Anaphylaxis  . Betadine [Povidone Iodine] Rash    Prescriptions prior to admission  Medication Sig Dispense Refill  . famotidine (PEPCID) 10 MG tablet Take 10 mg by mouth 2 (two) times daily as needed for heartburn.      . Prenatal Vit-Fe Fumarate-FA (PRENATAL MULTIVITAMIN) TABS tablet Take 1 tablet by mouth daily at 12 noon.      . promethazine (PHENERGAN) 25 MG tablet Take 25 mg by mouth every 6 (six) hours as needed for nausea.        Results for orders placed during the hospital encounter of 06/21/13 (from the past 24 hour(s))  URINALYSIS, ROUTINE W REFLEX MICROSCOPIC     Status: Abnormal   Collection Time    06/21/13  6:55 PM      Result Value Range   Color, Urine AMBER (*) YELLOW   APPearance HAZY (*) CLEAR   Specific Gravity, Urine >1.030 (*) 1.005 - 1.030   pH 6.5  5.0 - 8.0   Glucose, UA NEGATIVE  NEGATIVE mg/dL   Hgb urine dipstick NEGATIVE  NEGATIVE   Bilirubin Urine MODERATE (*) NEGATIVE   Ketones, ur >80 (*) NEGATIVE mg/dL   Protein, ur 782 (*) NEGATIVE mg/dL   Urobilinogen, UA 1.0  0.0 - 1.0 mg/dL   Nitrite NEGATIVE  NEGATIVE   Leukocytes, UA TRACE (*) NEGATIVE  URINE MICROSCOPIC-ADD ON     Status: Abnormal   Collection Time    06/21/13  6:55 PM      Result Value Range   Squamous Epithelial / LPF MANY (*) RARE   WBC, UA 7-10  <3 WBC/hpf   Bacteria, UA MANY (*) RARE   Urine-Other MUCOUS PRESENT     Review of Systems  Constitutional: Negative for fever and chills.  Eyes: Negative for blurred vision and double vision.  Gastrointestinal: Positive for nausea, vomiting and abdominal pain. Negative for diarrhea and constipation.       Bilateral lower abdominal pain; following vomiting episode.   Genitourinary: Negative for dysuria, urgency and frequency.  Neurological: Positive for headaches.   Physical Exam   Blood pressure 119/68, pulse 97, temperature 98.1 F (36.7 C), temperature source Oral, resp. rate 18, height 5' 4.5" (1.638 m), weight 85.276 kg (188 lb), last menstrual period 04/16/2013.  Physical Exam  Constitutional: She is oriented to person, place, and time. She appears well-developed and well-nourished. No distress.  Neck: Neck supple.  Neurological: She is alert and oriented to person, place, and time.  Skin: Skin is warm and dry. She is not diaphoretic.  Psychiatric: Her behavior is normal.    MAU Course  Procedures  MDM  D5 with Phenergan IV LR with multivitamin Pepcid  20 mg IVP    Report given to Franco Collet CNM who resumes care of this patient   2250: C/W Dr. Clearance Coots, orders for admit to the 3rd floor given.  Assessment and Plan   1. Hyperemesis complicating pregnancy, antepartum    Admit to women's unit   [facility], JENNIFER IRENE 06/21/2013, 8:05 PM   Tawnya Crook

## 2013-06-21 NOTE — MAU Note (Addendum)
Ongoing problem with vomiting. Taking phenergan pills and suppositiory and zofran.

## 2013-06-22 ENCOUNTER — Inpatient Hospital Stay (HOSPITAL_COMMUNITY): Payer: BC Managed Care – PPO

## 2013-06-22 ENCOUNTER — Encounter (HOSPITAL_COMMUNITY): Payer: Self-pay | Admitting: Obstetrics

## 2013-06-22 NOTE — Progress Notes (Signed)
UR completed 

## 2013-06-22 NOTE — Progress Notes (Signed)
Patient ID: Deanna Myers, female   DOB: 03-03-1991, 21 y.o.   MRN: [id number] Vital signs normal Feels much better will try on a regular diet today no more vomiting since admission

## 2013-06-22 NOTE — H&P (Signed)
Deanna Myers is a 22 y.o. female presenting for N/V and dehydration. Maternal Medical History:  Reason for admission: Nausea.  22 yo G1.  EDD 01-21-13.  Presents with N/V and dehydration.    OB History   Grav Para Term Preterm Abortions TAB SAB Ect Mult Living   1              Past Medical History  Diagnosis Date  . Asthma   . BV (bacterial vaginosis)   . Gonorrhea   . Chlamydia    History reviewed. No pertinent past surgical history. Family History: family history includes Cervical cancer in her paternal aunt; Congestive Heart Failure in her mother; Diabetes in her paternal grandmother; Hypertension in her mother. Social History:  reports that she has never smoked. She has never used smokeless tobacco. She reports that she does not drink alcohol or use illicit drugs.   Prenatal Transfer Tool  Maternal Diabetes: No Maternal Substance Abuse:  No  Significant Maternal Medications:  Meds include: Other:  Phenergan Significant Maternal Lab Results:  Lab values include: Other:  CMET  Review of Systems  Gastrointestinal: Positive for nausea and vomiting.  All other systems reviewed and are negative.      Blood pressure 111/71, pulse 84, temperature 98 F (36.7 C), temperature source Oral, resp. rate 18, height 5' 4.5" (1.638 m), weight 188 lb (85.276 kg), last menstrual period 04/16/2013, SpO2 100.00%. Maternal Exam:  Abdomen: Patient reports no abdominal tenderness.   Physical Exam  Constitutional: She is oriented to person, place, and time. She appears well-developed and well-nourished.  HENT:  Head: Normocephalic and atraumatic.  Eyes: Conjunctivae are normal. Pupils are equal, round, and reactive to light.  Neck: Normal range of motion. Neck supple.  Cardiovascular: Normal rate and regular rhythm.   Respiratory: Effort normal and breath sounds normal.  GI: Soft.  Musculoskeletal: Normal range of motion.  Neurological: She is alert and oriented to person, place,  and time.  Skin: Skin is warm and dry.  Psychiatric: She has a normal mood and affect. Her behavior is normal. Judgment and thought content normal.    Prenatal labs: ABO, Rh:   Antibody:   Rubella:   RPR:    HBsAg:    HIV:    GBS:     Assessment/Plan: 9.4 weeks.  Hyperemesis.  Admit.  Supportive management.   Deanna Myers A 06/22/2013, 1:05 AM

## 2013-06-22 NOTE — Progress Notes (Signed)
INITIAL NUTRITION ASSESSMENT  DOCUMENTATION CODES Per approved criteria  -Severe malnutrition in the context of acute illness or injury   INTERVENTION: Antenatal Regular diet  NUTRITION DIAGNOSIS: Inadequate oral intake  related to hyperemesis  as evidenced by n/v upon admission, 11 % loss of usual weight over 4 weeks.   Goal: Continued tol of regular diet, weight gain  Monitor:  Diet tol  Reason for Assessment: Weight loss  22 y.o. female  Admitting Dx: <principal problem not specified>  ASSESSMENT: Tolerated breakfast and lunch today. Reports prepregnancy weight of 215 lbs , and a 24 lb weight loss over 1 month. Typically tolerates solid food well in hospital and then n/v returns when discharged home  Meets ASPEN criteria for Dx of severe malnutrition based on a > 5 % loss of usual weight over 1 month, po intake of < 50 %of estimated needs for > 5 days, and moderated depletion of fat mass  Height: Ht Readings from Last 1 Encounters:  06/21/13 5' 4.5" (1.638 m)    Weight: Wt Readings from Last 1 Encounters:  06/22/13 191 lb 4 oz (86.75 kg)    Ideal Body Weight: 122 lbs  % Ideal Body Weight: 156 %  Wt Readings from Last 10 Encounters:  06/22/13 191 lb 4 oz (86.75 kg)  02/20/13 203 lb (92.08 kg)  02/13/13 202 lb (91.627 kg)  04/25/12 210 lb (95.255 kg) (98%*, Z = 2.08)   * Growth percentiles are based on CDC 2-20 Years data.    Usual Body Weight: 215 lbs  % Usual Body Weight: 89%  BMI:  Body mass index is 32.33 kg/(m^2).  Estimated Nutritional Needs: Kcal: 2000-2200 Protein: 80-90 g Fluid: 2.2 L  Skin: wnl  Diet Order: General  EDUCATION NEEDS: -No education needs identified at this time. Pt states he has a clear understanding of diet for hyperemesis   Intake/Output Summary (Last 24 hours) at 06/22/13 1347 Last data filed at 06/22/13 1200  Gross per 24 hour  Intake 1765.63 ml  Output   1300 ml  Net 465.63 ml       Labs:  No results  found for this basename: NA, K, CL, CO2, BUN, CREATININE, CALCIUM, MG, PHOS, GLUCOSE,  in the last 168 hours  CBG (last 3)  No results found for this basename: GLUCAP,  in the last 72 hours  Scheduled Meds: . multivitamin (MVI) IV infusion (LR or D5LR)   Intravenous Q24H  . ondansetron (ZOFRAN) IV  8 mg Intravenous Q6H  . pantoprazole (PROTONIX) IV  40 mg Intravenous Q12H    Continuous Infusions: . lactated ringers 125 mL/hr at 06/22/13 4098    Past Medical History  Diagnosis Date  . Asthma   . BV (bacterial vaginosis)   . Gonorrhea   . Chlamydia     History reviewed. No pertinent past surgical history.  Elisabeth Cara M.Odis Luster LDN Neonatal Nutrition Support Specialist Pager [phone]

## 2013-06-23 LAB — URINE CULTURE: Colony Count: >=100000

## 2013-06-23 MED ORDER — ONDANSETRON 4 MG PO TBDP
4.0000 mg | ORAL_TABLET | ORAL | Status: DC | PRN
  Administered 2013-06-23: 4 mg via ORAL
  Filled 2013-06-23: qty 1

## 2013-06-23 MED ORDER — SODIUM CHLORIDE 0.9 % IJ SOLN
3.0000 mL | Freq: Two times a day (BID) | INTRAMUSCULAR | Status: DC
  Administered 2013-06-24: 3 mL via INTRAVENOUS

## 2013-06-23 MED ORDER — PANTOPRAZOLE SODIUM 40 MG PO TBEC
40.0000 mg | DELAYED_RELEASE_TABLET | Freq: Two times a day (BID) | ORAL | Status: DC
  Administered 2013-06-23 – 2013-06-25 (×4): 40 mg via ORAL
  Filled 2013-06-23 (×5): qty 1

## 2013-06-23 MED ORDER — PROMETHAZINE HCL 25 MG PO TABS
25.0000 mg | ORAL_TABLET | ORAL | Status: DC | PRN
  Administered 2013-06-23 – 2013-06-25 (×4): 25 mg via ORAL
  Filled 2013-06-23 (×4): qty 1

## 2013-06-23 MED ORDER — ACETAMINOPHEN 325 MG PO TABS
650.0000 mg | ORAL_TABLET | Freq: Four times a day (QID) | ORAL | Status: DC | PRN
  Administered 2013-06-23 – 2013-06-24 (×2): 650 mg via ORAL
  Filled 2013-06-23 (×2): qty 2

## 2013-06-23 NOTE — Progress Notes (Signed)
Patient ID: Deanna Myers, female   DOB: 05/31/1991, 21 y.o.   MRN: [id number] Vital signs normal Since admission no nausea or vomiting she's been on 6 pounds and she is active she is on regular diet will observe to see if he tolerates this today

## 2013-06-24 MED ORDER — ZOLPIDEM TARTRATE 5 MG PO TABS
5.0000 mg | ORAL_TABLET | Freq: Every evening | ORAL | Status: DC | PRN
  Administered 2013-06-24: 5 mg via ORAL
  Filled 2013-06-24: qty 1

## 2013-06-24 MED ORDER — ONDANSETRON HCL 4 MG/2ML IJ SOLN: 4.0000 mg | Freq: Four times a day (QID) | INTRAMUSCULAR | Status: DC | PRN

## 2013-06-24 MED ORDER — LACTATED RINGERS IV SOLN
INTRAVENOUS | Status: DC
  Administered 2013-06-24 (×2): via INTRAVENOUS

## 2013-06-24 NOTE — Progress Notes (Signed)
Patient ID: Deanna Myers, female   DOB: May 08, 1991, 21 y.o.   MRN: [id number] Vital signs normal Tolerating feeding very well to excess she started having nausea and heaving last night an IV will be restarted and she restarted on IV Zofran today

## 2013-06-25 NOTE — Discharge Summary (Signed)
  Patient [redacted] weeks pregnant and was admitted because of severe hyperemesis since admission she was on Protonix 20 mg twice a day and a him a Phenergan and has had no nausea vomiting and is eating very well she been discharged today on Protonix 20 mg twice a day and Phenergan 25 mg by mouth every 4-6 hours to see me in 4 weeks and

## 2013-07-04 ENCOUNTER — Encounter (HOSPITAL_COMMUNITY): Payer: Self-pay | Admitting: *Deleted

## 2013-07-04 ENCOUNTER — Inpatient Hospital Stay (HOSPITAL_COMMUNITY)
Admission: AD | Admit: 2013-07-04 | Discharge: 2013-07-04 | Disposition: A | Discharge status: Home / Self Care | Payer: BC Managed Care – PPO | Source: Ambulatory Visit | Attending: Obstetrics | Admitting: Obstetrics

## 2013-07-04 DIAGNOSIS — O210 Mild hyperemesis gravidarum: Secondary | ICD-10-CM | POA: Insufficient documentation

## 2013-07-04 DIAGNOSIS — K5900 Constipation, unspecified: Secondary | ICD-10-CM | POA: Insufficient documentation

## 2013-07-04 DIAGNOSIS — K117 Disturbances of salivary secretion: Secondary | ICD-10-CM | POA: Diagnosis not present

## 2013-07-04 DIAGNOSIS — O9989 Other specified diseases and conditions complicating pregnancy, childbirth and the puerperium: Secondary | ICD-10-CM | POA: Insufficient documentation

## 2013-07-04 LAB — CBC WITH DIFFERENTIAL/PLATELET
Basophils Absolute: 0 10*3/uL (ref 0.0–0.1)
HCT: 34.1 % — ABNORMAL LOW (ref 36.0–46.0)
Lymphocytes Relative: 37 % (ref 12–46)
Monocytes Absolute: 0.4 10*3/uL (ref 0.1–1.0)
Neutro Abs: 2.9 10*3/uL (ref 1.7–7.7)
RBC: 4.3 MIL/uL (ref 3.87–5.11)
RDW: 12.8 % (ref 11.5–15.5)
WBC: 5.5 10*3/uL (ref 4.0–10.5)

## 2013-07-04 LAB — URINALYSIS, ROUTINE W REFLEX MICROSCOPIC
Bilirubin Urine: NEGATIVE
Ketones, ur: >80 mg/dL — AB
Protein, ur: NEGATIVE mg/dL
Urobilinogen, UA: 0.2 mg/dL (ref 0.0–1.0)

## 2013-07-04 LAB — COMPREHENSIVE METABOLIC PANEL
Alkaline Phosphatase: 45 U/L (ref 39–117)
BUN: 3 mg/dL — ABNORMAL LOW (ref 6–23)
Calcium: 9.2 mg/dL (ref 8.4–10.5)
Creatinine, Ser: 0.44 mg/dL — ABNORMAL LOW (ref 0.50–1.10)
GFR calc Af Amer: >90 mL/min (ref >90)
Glucose, Bld: 108 mg/dL — ABNORMAL HIGH (ref 70–99)
Total Protein: 7.4 g/dL (ref 6.0–8.3)

## 2013-07-04 LAB — URINE MICROSCOPIC-ADD ON

## 2013-07-04 MED ORDER — FLEET ENEMA 7-19 GM/118ML RE ENEM: 1.0000 | ENEMA | Freq: Every day | RECTAL | Status: DC | PRN

## 2013-07-04 MED ORDER — GLYCOPYRROLATE 1 MG PO TABS
1.0000 mg | ORAL_TABLET | Freq: Once | ORAL | Status: AC
  Administered 2013-07-04: 1 mg via ORAL
  Filled 2013-07-04: qty 1

## 2013-07-04 MED ORDER — VITAMIN B-6 50 MG PO TABS: 50.0000 mg | ORAL_TABLET | Freq: Every day | ORAL | Status: DC

## 2013-07-04 MED ORDER — PYRIDOXINE HCL 100 MG/ML IJ SOLN
100.0000 mg | Freq: Once | INTRAMUSCULAR | Status: AC
  Administered 2013-07-04: 100 mg via INTRAVENOUS
  Filled 2013-07-04: qty 1

## 2013-07-04 MED ORDER — SODIUM CHLORIDE 0.9 % IV SOLN
25.0000 mg | Freq: Once | INTRAVENOUS | Status: AC
  Administered 2013-07-04: 25 mg via INTRAVENOUS
  Filled 2013-07-04: qty 1

## 2013-07-04 MED ORDER — FAMOTIDINE IN NACL 20-0.9 MG/50ML-% IV SOLN
20.0000 mg | Freq: Once | INTRAVENOUS | Status: AC
  Administered 2013-07-04: 20 mg via INTRAVENOUS
  Filled 2013-07-04: qty 50

## 2013-07-04 MED ORDER — GLYCOPYRROLATE 1 MG PO TABS: 1.0000 mg | ORAL_TABLET | Freq: Three times a day (TID) | ORAL | Status: DC | PRN

## 2013-07-04 NOTE — MAU Note (Signed)
Pt reports she has been having nausea and vomiting for several weeks . Taking her medication with out relief for the last 2 days.

## 2013-07-04 NOTE — MAU Provider Note (Signed)
History     CSN: [id number]  Arrival date and time: 07/04/13 1705   None     Chief Complaint  Patient presents with  . Emesis During Pregnancy   HPI This is a 22 y.o. female at [redacted]w[redacted]d who presents with c/o persistent nausea, vomiting and spitting. Meds were working but not for past 2 days .  Was hospitalized recently for hydration. Did not tell me, but tells RN she has been constipated for 4 days. Does not want enema here, but will try one at home.   RN Note: Pt reports she has been having nausea and vomiting for several weeks . Taking her medication with out relief for the last 2 days.      OB History   Grav Para Term Preterm Abortions TAB SAB Ect Mult Living   3    2  2          Past Medical History  Diagnosis Date  . Asthma   . BV (bacterial vaginosis)   . Gonorrhea   . Chlamydia     Past Surgical History  Procedure Laterality Date  . No past surgeries      Family History  Problem Relation Age of Onset  . Hypertension Mother   . Congestive Heart Failure Mother   . Diabetes Paternal Grandmother   . Cervical cancer Paternal Aunt     History  Substance Use Topics  . Smoking status: Never Smoker   . Smokeless tobacco: Never Used  . Alcohol Use: No    Allergies:  Allergies  Allergen Reactions  . Shellfish Allergy Anaphylaxis  . Adhesive [Tape] Swelling and Other (See Comments)    Causes bruising.  . Betadine [Povidone Iodine] Rash    Prescriptions prior to admission  Medication Sig Dispense Refill  . acetaminophen (TYLENOL) 500 MG tablet Take 1,000 mg by mouth every 6 (six) hours as needed for pain (For headache.).      Marland Kitchen pantoprazole (PROTONIX) 20 MG tablet Take 20 mg by mouth 2 (two) times daily.      . Prenatal Vit-Fe Fumarate-FA (PRENATAL MULTIVITAMIN) TABS tablet Take 1 tablet by mouth daily at 12 noon.      . promethazine (PHENERGAN) 25 MG tablet Take 25 mg by mouth every 6 (six) hours as needed for nausea.        Review of Systems   Constitutional: Negative for fever, chills and malaise/fatigue.  Gastrointestinal: Positive for nausea, vomiting and constipation. Negative for abdominal pain and diarrhea.  Genitourinary: Negative for dysuria.  Neurological: Negative for dizziness.   Physical Exam   Blood pressure 122/67, pulse 88, temperature 98.7 F (37.1 C), temperature source Oral, resp. rate 16, height 5' 4.5" (1.638 m), weight 86.093 kg (189 lb 12.8 oz), last menstrual period 04/16/2013.  Physical Exam  Constitutional: She is oriented to person, place, and time. She appears well-developed and well-nourished. No distress (appears very energetic).  HENT:  Head: Normocephalic.  Cardiovascular: Normal rate.   Respiratory: Effort normal.  GI: Soft. She exhibits no distension. There is no tenderness. There is no rebound and no guarding.  Musculoskeletal: Normal range of motion.  Neurological: She is alert and oriented to person, place, and time.  Skin: Skin is warm and dry.  Psychiatric: She has a normal mood and affect.    MAU Course  Procedures  MDM Results for orders placed during the hospital encounter of 07/04/13 (from the past 24 hour(s))  COMPREHENSIVE METABOLIC PANEL     Status: Abnormal  Collection Time    07/04/13  5:50 PM      Result Value Range   Sodium 134 (*) 135 - 145 mEq/L   Potassium 3.3 (*) 3.5 - 5.1 mEq/L   Chloride 100  96 - 112 mEq/L   CO2 24  19 - 32 mEq/L   Glucose, Bld 108 (*) 70 - 99 mg/dL   BUN 3 (*) 6 - 23 mg/dL   Creatinine, Ser 4.54 (*) 0.50 - 1.10 mg/dL   Calcium 9.2  8.4 - 09.8 mg/dL   Total Protein 7.4  6.0 - 8.3 g/dL   Albumin 3.5  3.5 - 5.2 g/dL   AST 15  0 - 37 U/L   ALT 10  0 - 35 U/L   Alkaline Phosphatase 45  39 - 117 U/L   Total Bilirubin 0.2 (*) 0.3 - 1.2 mg/dL   GFR calc non Af Amer >90  >90 mL/min   GFR calc Af Amer >90  >90 mL/min  CBC WITH DIFFERENTIAL     Status: Abnormal   Collection Time    07/04/13  5:50 PM      Result Value Range   WBC 5.5  4.0 -  10.5 K/uL   RBC 4.30  3.87 - 5.11 MIL/uL   Hemoglobin 12.1  12.0 - 15.0 g/dL   HCT 11.9 (*) 14.7 - 82.9 %   MCV 79.3  78.0 - 100.0 fL   MCH 28.1  26.0 - 34.0 pg   MCHC 35.5  30.0 - 36.0 g/dL   RDW 56.2  13.0 - 86.5 %   Platelets 299  150 - 400 K/uL   Neutrophils Relative % 53  43 - 77 %   Neutro Abs 2.9  1.7 - 7.7 K/uL   Lymphocytes Relative 37  12 - 46 %   Lymphs Abs 2.0  0.7 - 4.0 K/uL   Monocytes Relative 8  3 - 12 %   Monocytes Absolute 0.4  0.1 - 1.0 K/uL   Eosinophils Relative 2  0 - 5 %   Eosinophils Absolute 0.1  0.0 - 0.7 K/uL   Basophils Relative 0  0 - 1 %   Basophils Absolute 0.0  0.0 - 0.1 K/uL   Unable to give urine sample  IV fluids with Phenergan, pepcid and B6 given.  Appears to be spitting more than vomiting.  Feels better after meds and IVF.  Wants to do enema at home  Assessment and Plan  A:  SIUP at [redacted]w[redacted]d       Hyperemesis      Ptyalism      Constipation  P:  Discharge home      Rx Robinul for ptyalism      Rx Vit B6 for hyperemesis adjunct      Fleet enema for constipation      Followup with Dr Raynelle Jan 07/04/2013, 6:05 PM

## 2013-07-06 LAB — URINE CULTURE: Colony Count: >=100000

## 2013-07-07 ENCOUNTER — Encounter (HOSPITAL_COMMUNITY): Payer: Self-pay

## 2013-07-07 ENCOUNTER — Inpatient Hospital Stay (HOSPITAL_COMMUNITY)
Admission: AD | Admit: 2013-07-07 | Discharge: 2013-07-10 | DRG: 886 | Disposition: A | Discharge status: Home / Self Care | Payer: BC Managed Care – PPO | Source: Ambulatory Visit | Attending: Obstetrics | Admitting: Obstetrics

## 2013-07-07 DIAGNOSIS — E41 Nutritional marasmus: Secondary | ICD-10-CM | POA: Diagnosis present

## 2013-07-07 DIAGNOSIS — O210 Mild hyperemesis gravidarum: Secondary | ICD-10-CM

## 2013-07-07 DIAGNOSIS — O99891 Other specified diseases and conditions complicating pregnancy: Secondary | ICD-10-CM | POA: Diagnosis present

## 2013-07-07 HISTORY — DX: Attention-deficit hyperactivity disorder, unspecified type: F90.9

## 2013-07-07 LAB — URINALYSIS, ROUTINE W REFLEX MICROSCOPIC
Leukocytes, UA: NEGATIVE
Nitrite: NEGATIVE
Specific Gravity, Urine: 1.015 (ref 1.005–1.030)
pH: 8.5 — ABNORMAL HIGH (ref 5.0–8.0)

## 2013-07-07 LAB — COMPREHENSIVE METABOLIC PANEL
ALT: 8 U/L (ref 0–35)
Alkaline Phosphatase: 36 U/L — ABNORMAL LOW (ref 39–117)
CO2: 23 mEq/L (ref 19–32)
Chloride: 98 mEq/L (ref 96–112)
Creatinine, Ser: 0.38 mg/dL — ABNORMAL LOW (ref 0.50–1.10)
GFR calc Af Amer: >90 mL/min (ref >90)
Glucose, Bld: 205 mg/dL — ABNORMAL HIGH (ref 70–99)
Potassium: 3.3 mEq/L — ABNORMAL LOW (ref 3.5–5.1)
Sodium: 130 mEq/L — ABNORMAL LOW (ref 135–145)
Total Protein: 6.4 g/dL (ref 6.0–8.3)

## 2013-07-07 MED ORDER — ONDANSETRON HCL 4 MG/2ML IJ SOLN
4.0000 mg | Freq: Four times a day (QID) | INTRAMUSCULAR | Status: DC | PRN
  Administered 2013-07-07 – 2013-07-08 (×3): 4 mg via INTRAVENOUS
  Filled 2013-07-07 (×3): qty 2

## 2013-07-07 MED ORDER — FAMOTIDINE IN NACL 20-0.9 MG/50ML-% IV SOLN
20.0000 mg | Freq: Two times a day (BID) | INTRAVENOUS | Status: DC
  Administered 2013-07-07 – 2013-07-09 (×5): 20 mg via INTRAVENOUS
  Filled 2013-07-07 (×5): qty 50

## 2013-07-07 MED ORDER — ONDANSETRON HCL 4 MG PO TABS
8.0000 mg | ORAL_TABLET | Freq: Four times a day (QID) | ORAL | Status: DC | PRN
  Administered 2013-07-10: 8 mg via ORAL
  Filled 2013-07-07 (×2): qty 2

## 2013-07-07 MED ORDER — THIAMINE HCL 100 MG/ML IJ SOLN: Freq: Once | INTRAVENOUS | Status: DC

## 2013-07-07 MED ORDER — LACTATED RINGERS IV BOLUS (SEPSIS)
1000.0000 mL | Freq: Once | INTRAVENOUS | Status: AC
  Administered 2013-07-07: 1000 mL via INTRAVENOUS

## 2013-07-07 MED ORDER — GLYCOPYRROLATE 0.2 MG/ML IJ SOLN
0.1000 mg | Freq: Three times a day (TID) | INTRAMUSCULAR | Status: DC
  Administered 2013-07-07 – 2013-07-10 (×10): 0.1 mg via INTRAVENOUS
  Filled 2013-07-07 (×13): qty 0.5

## 2013-07-07 MED ORDER — THIAMINE HCL 100 MG/ML IJ SOLN
INTRAVENOUS | Status: DC
  Administered 2013-07-07 – 2013-07-10 (×4): via INTRAVENOUS
  Filled 2013-07-07 (×5): qty 1000

## 2013-07-07 MED ORDER — PROMETHAZINE HCL 25 MG/ML IJ SOLN
25.0000 mg | Freq: Once | INTRAVENOUS | Status: AC
  Administered 2013-07-07: 25 mg via INTRAVENOUS
  Filled 2013-07-07: qty 1

## 2013-07-07 MED ORDER — FAMOTIDINE IN NACL 20-0.9 MG/50ML-% IV SOLN
20.0000 mg | Freq: Once | INTRAVENOUS | Status: AC
  Administered 2013-07-07: 20 mg via INTRAVENOUS
  Filled 2013-07-07: qty 50

## 2013-07-07 MED ORDER — LACTATED RINGERS IV SOLN
INTRAVENOUS | Status: DC
  Administered 2013-07-08 – 2013-07-09 (×6): via INTRAVENOUS

## 2013-07-07 MED ORDER — M.V.I. ADULT IV INJ: INTRAVENOUS | Status: DC

## 2013-07-07 NOTE — MAU Provider Note (Signed)
History     CSN: [id number]  Arrival date and time: 07/07/13 0229   First Provider Initiated Contact with Patient 07/07/13 [phone]      Chief Complaint  Patient presents with  . Emesis During Pregnancy   HPI  [name] is a 22 y.o. female G3P0020 at [redacted]w[redacted]d who presents with N/V in pregnancy. She has been vomiting since 1000 yesterday, she is vomiting yellow fluid. She is unable to keep anything down at this point. She is requesting to be admitted to the hospital because she cannot continue to vomit at home like she is. She was admitted one time in this pregnancy; went home and did well for a few days.   OB History   Grav Para Term Preterm Abortions TAB SAB Ect Mult Living   3    2  2          Past Medical History  Diagnosis Date  . Asthma   . BV (bacterial vaginosis)   . Gonorrhea   . Chlamydia     Past Surgical History  Procedure Laterality Date  . No past surgeries      Family History  Problem Relation Age of Onset  . Hypertension Mother   . Congestive Heart Failure Mother   . Diabetes Paternal Grandmother   . Cervical cancer Paternal Aunt     History  Substance Use Topics  . Smoking status: Never Smoker   . Smokeless tobacco: Never Used  . Alcohol Use: No    Allergies:  Allergies  Allergen Reactions  . Shellfish Allergy Anaphylaxis  . Adhesive [Tape] Swelling and Other (See Comments)    Causes bruising.  . Betadine [Povidone Iodine] Rash    Prescriptions prior to admission  Medication Sig Dispense Refill  . acetaminophen (TYLENOL) 500 MG tablet Take 1,000 mg by mouth every 6 (six) hours as needed for pain (For headache.).      Marland Kitchen pantoprazole (PROTONIX) 20 MG tablet Take 20 mg by mouth 2 (two) times daily.      . Prenatal Vit-Fe Fumarate-FA (PRENATAL MULTIVITAMIN) TABS tablet Take 1 tablet by mouth daily at 12 noon.      . promethazine (PHENERGAN) 25 MG tablet Take 25 mg by mouth every 6 (six) hours as needed for nausea.      Marland Kitchen pyridOXINE  (VITAMIN B-6) 50 MG tablet Take 1 tablet (50 mg total) by mouth daily.  30 tablet  1  . glycopyrrolate (ROBINUL) 1 MG tablet Take 1 tablet (1 mg total) by mouth 3 (three) times daily as needed.  30 tablet  0  . sodium phosphate (FLEET) 7-19 GM/118ML ENEM Place 1 enema rectally daily as needed.  1 Bottle  0    Review of Systems  Constitutional: Negative for fever and chills.  Gastrointestinal: Positive for heartburn, nausea, vomiting, abdominal pain and constipation. Negative for diarrhea.       Last BM was unknown; she feels its been 2-3 days  Genitourinary: Negative for dysuria, urgency, frequency and hematuria.       No vaginal discharge. No vaginal bleeding. No dysuria.   Neurological: Positive for headaches.   Physical Exam   Blood pressure 113/66, pulse 107, temperature 98.7 F (37.1 C), temperature source Oral, resp. rate 20, height 5\' 6"  (1.676 m), weight 189 lb 6.4 oz (85.911 kg), last menstrual period 04/16/2013, SpO2 100.00%. Fetal heart tones by doppler 163 bpm   Physical Exam  Constitutional: She is oriented to person, place, and time. She appears well-developed  and well-nourished. No distress.  Eyes: Pupils are equal, round, and reactive to light.  Neck: Neck supple.  Respiratory: Effort normal.  Musculoskeletal: Normal range of motion.  Neurological: She is alert and oriented to person, place, and time.  Skin: Skin is warm. She is not diaphoretic. No pallor.    MAU Course  Procedures None  MDM +fht UA LR with phenergan 25 mg Bolus  Pepcid 20 mg IVPB Consulted with Dr. Clearance Coots at [phone]: Admit to 3rd floor per Dr. Clearance Coots   Assessment and Plan  A: Hyperemesis in pregnancy  P: Admit per Dr. Clearance Coots.   RASCH, JENNIFER IRENE FNP-C 07/07/2013, 3:11 AM

## 2013-07-07 NOTE — MAU Note (Signed)
Pt states she started vomiting around 10 am. The N/V went away and has now returned. States she has vomited around 8 times tonight. Cannot keep anything down. Has been hospitalized for hyperemesis during this pregnancy. States none of her medications are working.

## 2013-07-07 NOTE — H&P (Signed)
Deanna Myers is a 22 y.o. female presenting for N/V. Maternal Medical History:  Reason for admission: Nausea.  22 yo G3 P.  EDC 01-21-13.  Presents with N/V and inability to keep anything down.  Prenatal complications: Hyperemesis     OB History   Grav Para Term Preterm Abortions TAB SAB Ect Mult Living   3    2  2         Past Medical History  Diagnosis Date  . Asthma   . BV (bacterial vaginosis)   . Gonorrhea   . Chlamydia   . ADHD (attention deficit hyperactivity disorder)     since childhood   History reviewed. No pertinent past surgical history. Family History: family history includes Cervical cancer in her paternal aunt; Congestive Heart Failure in her maternal grandmother; Hypertension in her father. Social History:  reports that she has never smoked. She has never used smokeless tobacco. She reports that she does not drink alcohol or use illicit drugs.   Prenatal Transfer Tool  Maternal Diabetes: No Genetic Screening:  n/a Maternal Ultrasounds/Referrals: Normal Fetal Ultrasounds or other Referrals:  None Maternal Substance Abuse:  No Significant Maternal Medications:  Phenergan Significant Maternal Lab Results:  CMET Other Comments:  Previous admission for N/V.  Review of Systems  Gastrointestinal: Positive for nausea.      Blood pressure 101/64, pulse 91, temperature 98.1 F (36.7 C), temperature source Oral, resp. rate 18, height 5\' 6"  (1.676 m), weight 191 lb (86.637 kg), last menstrual period 04/16/2013, SpO2 100.00%. Maternal Exam:  Abdomen: Patient reports no abdominal tenderness.   Physical Exam  Nursing note and vitals reviewed. Constitutional: She is oriented to person, place, and time. She appears well-developed and well-nourished.  HENT:  Head: Normocephalic and atraumatic.  Eyes: Conjunctivae are normal. Pupils are equal, round, and reactive to light.  Neck: Normal range of motion. Neck supple.  Cardiovascular: Normal rate and regular  rhythm.   Respiratory: Effort normal and breath sounds normal.  GI: Soft. Bowel sounds are normal.  Musculoskeletal: Normal range of motion.  Neurological: She is alert and oriented to person, place, and time.  Skin: Skin is warm and dry.  Psychiatric: She has a normal mood and affect. Her behavior is normal. Judgment and thought content normal.    Prenatal labs: ABO, Rh:   Antibody:   Rubella:   RPR:    HBsAg:    HIV:    GBS:     Assessment/Plan: 11.5 weeks.  Hyperemesis Gravidarum.  Admit.  Supportive management.   Lynsay Fesperman A 07/07/2013, 5:45 AM

## 2013-07-08 MED ORDER — PROMETHAZINE HCL 25 MG/ML IJ SOLN
12.5000 mg | INTRAMUSCULAR | Status: DC | PRN
  Administered 2013-07-08 – 2013-07-09 (×5): 12.5 mg via INTRAVENOUS
  Filled 2013-07-08 (×5): qty 1

## 2013-07-08 MED ORDER — DOCUSATE SODIUM 100 MG PO CAPS
100.0000 mg | ORAL_CAPSULE | Freq: Two times a day (BID) | ORAL | Status: DC
  Administered 2013-07-08 – 2013-07-10 (×4): 100 mg via ORAL
  Filled 2013-07-08 (×5): qty 1

## 2013-07-08 NOTE — Progress Notes (Signed)
Patient ID: Deanna Myers, female   DOB: 1990/11/28, 21 y.o.   MRN: [id number] Hospital Day: 2  S: Tolerating some diet.  O: Blood pressure 106/62, pulse 77, temperature 98 F (36.7 C), temperature source Oral, resp. rate 20, height 5\' 6"  (1.676 m), weight 195 lb 0.8 oz (88.474 kg), last menstrual period 04/16/2013, SpO2 100.00%.   WNU:UVOZDGUY: 150 bpm Toco: None SVE:   A/P- 22 y.o. admitted with:  N/V and dehydration.  Stable.  Continue supportive management.  Present on Admission:  **None**  Pregnancy Complications: Hyperemesis  Preterm labor management: no treatment necessary Dating:  [redacted]w[redacted]d PNL Needed:  none FWB:  good PTL:  stable

## 2013-07-09 MED ORDER — METOCLOPRAMIDE HCL 5 MG/ML IJ SOLN
10.0000 mg | Freq: Four times a day (QID) | INTRAMUSCULAR | Status: DC
  Administered 2013-07-09 – 2013-07-10 (×3): 10 mg via INTRAVENOUS
  Filled 2013-07-09 (×3): qty 2

## 2013-07-09 MED ORDER — FAMOTIDINE 20 MG PO TABS
20.0000 mg | ORAL_TABLET | Freq: Two times a day (BID) | ORAL | Status: DC
  Administered 2013-07-09 – 2013-07-10 (×2): 20 mg via ORAL
  Filled 2013-07-09 (×2): qty 1

## 2013-07-09 NOTE — Care Management Note (Signed)
  Page 2 of 2   07/09/2013     1:28:31 PM   CARE MANAGEMENT NOTE 07/09/2013  Patient:  Deanna Myers, Deanna Myers   Account Number:  [id number]  Date Initiated:  07/09/2013  Documentation initiated by:  Emilio Math  Subjective/Objective Assessment:   hyperemesis     Action/Plan:   Home Health for SQ Reglan pump   Anticipated DC Date:  07/10/2013   Anticipated DC Plan:  HOME W HOME HEALTH SERVICES      DC Planning Services  CM consult      Choice offered to / List presented to:  C-1 Patient   DME arranged  ZOFRAN/REGLAN PUMP      DME agency  [facility]     [facility] arranged  HH-1 RN      [facility] agency  [facility]   Status of service:  In process, will continue to follow  Discharge Disposition:  HOME W HOME HEALTH SERVICES  Per UR Regulation:  Reviewed for med. necessity/level of care/duration of stay  Comments:  07-09-13 1020 L. Floyce Stakes [facility] # [phone]- RN on unit called Case Manager to see patient and stated Dr. Gaynell Face wanted patient to have a Reglan SQ pump for home use through [facility] Agency.  Spoke to patient and list provided of Home Health Agencies and patient chose [facility].  Baxter Hire [phone] referral called to her with [facility]. Spoke to Jeri Modena IV/Pharmacy with [facility] # [phone] and reviewed what [facility] recommends for Reglan  dosage.  She states she will plan to have SQ pump delivered this pm and will be by between 10:00-11:00 tomorrow Tuesday 07-10-13 and place  the SQ Reglan pump prior to patient being discharged to home tomorrow.  CM called and spoke to Dr. Gaynell Face and reviewed Reglan SQ 1.5 mg per hour continously and 2.0 mg SQ every 4 hours bolus prn.  Telephone orders received.  Duration for pump is 2 weeks.  Orders placed in EPIC and instructions on how to complete face to face was left on shadow chart and reviewed with RN to give to Dr. Gaynell Face to complete prior to discharge.  Plan reviewed with patient  and patient verbalized understanding and in agreement to plan.

## 2013-07-09 NOTE — Progress Notes (Signed)
Patient ID: Deanna Myers, female   DOB: [birth year], 22 y.o.   MRN: [id number] Vital signs normal Since admission patient has had no nausea or vomiting  Since readmission

## 2013-07-09 NOTE — Progress Notes (Signed)
Ur chart review completed.  

## 2013-07-09 NOTE — Progress Notes (Signed)
INITIAL NUTRITION ASSESSMENT  DOCUMENTATION CODES Per approved criteria  -Severe malnutrition in the context of acute illness or injury   INTERVENTION: Antenatal Regular diet  NUTRITION DIAGNOSIS: Inadequate oral intake  related to hyperemesis  as evidenced by n/v upon admission, 9 % loss of usual weight over 6 weeks.   Goal: Continued tol of regular diet, weight gain  Monitor:  Diet tol, weight trend  Reason for Assessment: Weight loss  22 y.o. female  Admitting Dx: <principal problem not specified>  ASSESSMENT: No emesis, positive weight trend. Weight loss now less dramatic between admissions. Typically tolerates solid food well in hospital and then n/v returns when discharged home  Meets ASPEN criteria for Dx of severe malnutrition based on a > 5 % loss of usual weight over 1 month, po intake of < 50 %of estimated needs for > 5 days PTA, and moderated depletion of fat mass  Height: Ht Readings from Last 1 Encounters:  07/07/13 5\' 6"  (1.676 m)    Weight: Wt Readings from Last 1 Encounters:  07/09/13 196 lb 12 oz (89.245 kg)    Ideal Body Weight: 122 lbs  % Ideal Body Weight: 160 %  Wt Readings from Last 10 Encounters:  07/09/13 196 lb 12 oz (89.245 kg)  07/04/13 189 lb 12.8 oz (86.093 kg)  06/25/13 195 lb 12.3 oz (88.8 kg)  02/20/13 203 lb (92.08 kg)  02/13/13 202 lb (91.627 kg)  04/25/12 210 lb (95.255 kg) (98%*, Z = 2.08)   * Growth percentiles are based on CDC 2-20 Years data.    Usual Body Weight: 215 lbs  % Usual Body Weight: 91%  BMI:  Body mass index is 31.77 kg/(m^2).  Estimated Nutritional Needs: Kcal: 2000-2200 Protein: 80-90 g Fluid: 2.2 L  Skin: wnl  Diet Order: General  EDUCATION NEEDS: -No education needs identified at this time. Pt states he has a clear understanding of diet for hyperemesis   Intake/Output Summary (Last 24 hours) at 07/09/13 1400 Last data filed at 07/09/13 0600  Gross per 24 hour  Intake   2065 ml  Output    2750 ml  Net   -685 ml       Labs:   Recent Labs Lab 07/04/13 1750 07/07/13 0458  NA 134* 130*  K 3.3* 3.3*  CL 100 98  CO2 24 23  BUN 3* 4*  CREATININE 0.44* 0.38*  CALCIUM 9.2 8.9  GLUCOSE 108* 205*    CBG (last 3)  No results found for this basename: GLUCAP,  in the last 72 hours  Scheduled Meds: . docusate sodium  100 mg Oral BID  . famotidine  20 mg Oral BID  . glycopyrrolate  0.1 mg Intravenous TID  . lactated ringers 1,000 mL with multivitamins adult (MVI -12) 10 mL, thiamine (B-1) 100 mg, folic acid 1 mg infusion   Intravenous Q24H    Continuous Infusions: . lactated ringers 125 mL/hr at 07/09/13 0531    Past Medical History  Diagnosis Date  . Asthma   . BV (bacterial vaginosis)   . Gonorrhea   . Chlamydia   . ADHD (attention deficit hyperactivity disorder)     since childhood    History reviewed. No pertinent past surgical history.  Elisabeth Cara M.Odis Luster LDN Neonatal Nutrition Support Specialist Pager [phone]

## 2013-07-09 NOTE — Progress Notes (Signed)
This patient is receiving famotidine. Based on criteria approved by the Pharmacy and Therapeutics Committee, this medication is being converted to the equivalent oral dose form. These criteria include:   . The patient is eating (either orally or per tube) and/or has been taking other orally administered medications for at least 24 hours.  . This patient has no evidence of active gastrointestinal bleeding or impaired GI absorption (gastrectomy, short bowel, patient on TNA or NPO).   If you have questions about this conversion, please contact the pharmacy department.  Natasha Bence, [facility] 07/09/2013 10:54 AM

## 2013-07-10 NOTE — Progress Notes (Signed)
Pt d/c home  reglan pump in place  Teaching complete

## 2013-07-10 NOTE — Discharge Summary (Signed)
Patient admitted because of hyperemesis this is her second admission and when she is in the hospital she is without nausea vomiting she was placed on Reglan yesterday and did well and is to be discharged today on a Reglan pump which be active for the next 2 weeks she is [redacted] weeks pregnant at this time

## 2013-07-18 ENCOUNTER — Inpatient Hospital Stay (HOSPITAL_COMMUNITY)
Admission: AD | Admit: 2013-07-18 | Discharge: 2013-07-19 | Disposition: A | Discharge status: Home / Self Care | Payer: BC Managed Care – PPO | Source: Ambulatory Visit | Attending: Obstetrics | Admitting: Obstetrics

## 2013-07-18 ENCOUNTER — Encounter (HOSPITAL_COMMUNITY): Payer: Self-pay | Admitting: General Practice

## 2013-07-18 DIAGNOSIS — R109 Unspecified abdominal pain: Secondary | ICD-10-CM | POA: Insufficient documentation

## 2013-07-18 DIAGNOSIS — O211 Hyperemesis gravidarum with metabolic disturbance: Secondary | ICD-10-CM

## 2013-07-18 DIAGNOSIS — K117 Disturbances of salivary secretion: Secondary | ICD-10-CM | POA: Insufficient documentation

## 2013-07-18 DIAGNOSIS — O9989 Other specified diseases and conditions complicating pregnancy, childbirth and the puerperium: Secondary | ICD-10-CM | POA: Insufficient documentation

## 2013-07-18 DIAGNOSIS — O210 Mild hyperemesis gravidarum: Secondary | ICD-10-CM | POA: Insufficient documentation

## 2013-07-18 LAB — CBC
Hemoglobin: 11.3 g/dL — ABNORMAL LOW (ref 12.0–15.0)
MCH: 27.9 pg (ref 26.0–34.0)
MCV: 78.3 fL (ref 78.0–100.0)
RBC: 4.05 MIL/uL (ref 3.87–5.11)
WBC: 6.7 10*3/uL (ref 4.0–10.5)

## 2013-07-18 LAB — COMPREHENSIVE METABOLIC PANEL
ALT: 11 U/L (ref 0–35)
AST: 14 U/L (ref 0–37)
CO2: 23 mEq/L (ref 19–32)
Calcium: 9.7 mg/dL (ref 8.4–10.5)
Chloride: 100 mEq/L (ref 96–112)
Creatinine, Ser: 0.42 mg/dL — ABNORMAL LOW (ref 0.50–1.10)
GFR calc Af Amer: >90 mL/min (ref >90)
GFR calc non Af Amer: >90 mL/min (ref >90)
Glucose, Bld: 160 mg/dL — ABNORMAL HIGH (ref 70–99)
Sodium: 136 mEq/L (ref 135–145)
Total Bilirubin: 0.3 mg/dL (ref 0.3–1.2)

## 2013-07-18 LAB — LIPASE, BLOOD: Lipase: 21 U/L (ref 11–59)

## 2013-07-18 MED ORDER — THIAMINE HCL 100 MG/ML IJ SOLN
Freq: Once | INTRAVENOUS | Status: AC
  Administered 2013-07-18: 23:00:00 via INTRAVENOUS
  Filled 2013-07-18: qty 1000

## 2013-07-18 MED ORDER — PROMETHAZINE HCL 25 MG/ML IJ SOLN
25.0000 mg | Freq: Once | INTRAVENOUS | Status: AC
  Administered 2013-07-18: 25 mg via INTRAVENOUS
  Filled 2013-07-18: qty 1

## 2013-07-18 MED ORDER — GLYCOPYRROLATE 0.2 MG/ML IJ SOLN
0.1000 mg | Freq: Once | INTRAMUSCULAR | Status: AC
  Administered 2013-07-18: 0.1 mg via INTRAVENOUS
  Filled 2013-07-18: qty 0.5

## 2013-07-18 MED ORDER — PANTOPRAZOLE SODIUM 40 MG IV SOLR
40.0000 mg | Freq: Once | INTRAVENOUS | Status: AC
  Administered 2013-07-18: 40 mg via INTRAVENOUS
  Filled 2013-07-18: qty 40

## 2013-07-18 NOTE — MAU Note (Signed)
Pt reports continued nausea and vomiting, has continuous meds per pump but states it is not working. Lower abd pain.

## 2013-07-18 NOTE — MAU Note (Signed)
Patient states her vomiting started 2 months ago and she vomits every day about 8 times a day. She reports her abdominal pain also started 2 months ago and intensifies with her vomiting.

## 2013-07-18 NOTE — MAU Provider Note (Signed)
Chief Complaint: Emesis During Pregnancy and Abdominal Pain   First Provider Initiated Contact with Patient 07/18/13 2109     SUBJECTIVE HPI: Deanna Myers is a 22 y.o. G3P0020 at [redacted]w[redacted]d by LMP who presents with exacerbation of hyperemesis. Has been seen in maternity admissions 6 times and admitted to of those times for the same. She has a Zofran pump, but does not feel like Zofran helps her. States Phenergan works better, but was prescribed suppositories and they caused diarrhea. Does not have a prescription for Phenergan tablets. Vomits approximately 8 times per day. Complains of making too much saliva which makes her to drool. Denies fever, chills, sick contacts, vaginal bleeding, vaginal discharge. Reports generalized abdominal pain while vomiting only. None otherwise.  Past Medical History  Diagnosis Date  . Asthma   . BV (bacterial vaginosis)   . Gonorrhea   . Chlamydia   . ADHD (attention deficit hyperactivity disorder)     since childhood   OB History  Gravida Para Term Preterm AB SAB TAB Ectopic Multiple Living  3    2 2         # Outcome Date GA Lbr Len/2nd Weight Sex Delivery Anes PTL Lv  3 CUR           2 SAB           1 SAB              History reviewed. No pertinent past surgical history. History   Social History  . Marital Status: Single    Spouse Name: N/A    Number of Children: 0  . Years of Education: N/A   Occupational History  . Cook    Social History Main Topics  . Smoking status: Never Smoker   . Smokeless tobacco: Never Used  . Alcohol Use: No  . Drug Use: No  . Sexual Activity: Yes    Partners: Male     Comment: pregnant   Other Topics Concern  . Not on file   Social History Narrative  . No narrative on file   No current facility-administered medications on file prior to encounter.   No current outpatient prescriptions on file prior to encounter.   Allergies  Allergen Reactions  . Shellfish Allergy Anaphylaxis  . Adhesive [Tape]  Swelling and Other (See Comments)    Causes bruising.  . Betadine [Povidone Iodine] Rash    ROS: Pertinent items in HPI  OBJECTIVE Blood pressure 107/67, pulse 95, temperature 99 F (37.2 C), temperature source Oral, resp. rate 22, height 5\' 6"  (1.676 m), weight 82.555 kg (182 lb), last menstrual period 04/16/2013, SpO2 100.00%. GENERAL: Well-developed, well-nourished female in mild distress. Weak-appearing. HEENT: Normocephalic. Mucous membranes moist. HEART: normal rate RESP: normal effort ABDOMEN: Soft, non-tender EXTREMITIES: Nontender, no edema NEURO: Alert and oriented SPECULUM EXAM: Deferred Positive fetal heart rate by Doppler.  LAB RESULTS Results for orders placed during the hospital encounter of 07/18/13 (from the past 24 hour(s))  CBC     Status: Abnormal   Collection Time    07/18/13  9:20 PM      Result Value Range   WBC 6.7  4.0 - 10.5 K/uL   RBC 4.05  3.87 - 5.11 MIL/uL   Hemoglobin 11.3 (*) 12.0 - 15.0 g/dL   HCT 56.2 (*) 13.0 - 86.5 %   MCV 78.3  78.0 - 100.0 fL   MCH 27.9  26.0 - 34.0 pg   MCHC 35.6  30.0 - 36.0 g/dL  RDW 12.8  11.5 - 15.5 %   Platelets 297  150 - 400 K/uL  COMPREHENSIVE METABOLIC PANEL     Status: Abnormal   Collection Time    07/18/13  9:20 PM      Result Value Range   Sodium 136  135 - 145 mEq/L   Potassium 3.2 (*) 3.5 - 5.1 mEq/L   Chloride 100  96 - 112 mEq/L   CO2 23  19 - 32 mEq/L   Glucose, Bld 160 (*) 70 - 99 mg/dL   BUN 6  6 - 23 mg/dL   Creatinine, Ser 4.09 (*) 0.50 - 1.10 mg/dL   Calcium 9.7  8.4 - 81.1 mg/dL   Total Protein 6.8  6.0 - 8.3 g/dL   Albumin 3.2 (*) 3.5 - 5.2 g/dL   AST 14  0 - 37 U/L   ALT 11  0 - 35 U/L   Alkaline Phosphatase 43  39 - 117 U/L   Total Bilirubin 0.3  0.3 - 1.2 mg/dL   GFR calc non Af Amer >90  >90 mL/min   GFR calc Af Amer >90  >90 mL/min  LIPASE, BLOOD     Status: None   Collection Time    07/18/13  9:20 PM      Result Value Range   Lipase 21  11 - 59 U/L  AMYLASE     Status:  None   Collection Time    07/18/13  9:20 PM      Result Value Range   Amylase 64  0 - 105 U/L    IMAGING N/A  MAU COURSE Phenergan infusion of D5 LR and Robinul.  Banana bag with 20 mEq of potassium given.  Feeling much better. Tolerating liquids, but asking for Phenergan. Tolerating solids after induced.  ASSESSMENT 1. Hyperemesis gravidarum with electrolyte imbalance   2. Ptyalism     PLAN Discharge home per consult with Dr. Gaynell Face. Advance diet slowly. Discussed effectiveness a Zofran pump with Dr. Gaynell Face.     Follow-up Information   Follow up with MARSHALL,BERNARD A, MD. (As scheduled or as needed if symptoms worsen)    Specialty:  Obstetrics and Gynecology   Contact information:   [address] [place] [address] [phone]       Follow up with THE [facility] OF [place] MATERNITY ADMISSIONS. (As needed in emergencies)    Contact information:   [address] [id number] [place] [address] [phone]       Medication List         glycopyrrolate 1 MG tablet  Commonly known as:  ROBINUL  Take 1-2 tablets (1-2 mg total) by mouth 3 (three) times daily.     promethazine 25 MG tablet  Commonly known as:  PHENERGAN  Take 1 tablet (25 mg total) by mouth every 6 (six) hours as needed for nausea.     promethazine 25 MG suppository  Commonly known as:  PHENERGAN  Place 1 suppository (25 mg total) rectally every 6 (six) hours as needed for nausea. May take vaginally.           Follow-up Information   Follow up with MARSHALL,BERNARD A, MD. (As scheduled or as needed if symptoms worsen)    Specialty:  Obstetrics and Gynecology   Contact information:   [address] [place] [address] [phone]       Follow up with THE [facility] OF [place] MATERNITY ADMISSIONS. (As needed in  emergencies)    Contact information:   [address] [id number] [place]  [address] [phone]       Medication List         glycopyrrolate 1 MG tablet  Commonly known as:  ROBINUL  Take 1-2 tablets (1-2 mg total) by mouth 3 (three) times daily.     promethazine 25 MG tablet  Commonly known as:  PHENERGAN  Take 1 tablet (25 mg total) by mouth every 6 (six) hours as needed for nausea.     promethazine 25 MG suppository  Commonly known as:  PHENERGAN  Place 1 suppository (25 mg total) rectally every 6 (six) hours as needed for nausea. May take vaginally.         [place], CNM 07/19/2013  3:42 AM

## 2013-07-19 DIAGNOSIS — O211 Hyperemesis gravidarum with metabolic disturbance: Secondary | ICD-10-CM

## 2013-07-19 MED ORDER — PROMETHAZINE HCL 25 MG/ML IJ SOLN
25.0000 mg | Freq: Once | INTRAMUSCULAR | Status: AC
  Administered 2013-07-19: 25 mg via INTRAVENOUS
  Filled 2013-07-19: qty 1

## 2013-07-19 MED ORDER — PROMETHAZINE HCL 25 MG PO TABS: 25.0000 mg | ORAL_TABLET | Freq: Four times a day (QID) | ORAL | Status: DC | PRN

## 2013-07-19 MED ORDER — GLYCOPYRROLATE 1 MG PO TABS: 1.0000 mg | ORAL_TABLET | Freq: Three times a day (TID) | ORAL | Status: DC

## 2013-07-19 MED ORDER — PROMETHAZINE HCL 25 MG RE SUPP: 25.0000 mg | Freq: Four times a day (QID) | RECTAL | Status: DC | PRN

## 2013-07-19 NOTE — Progress Notes (Signed)
Pt's family denies patient eating food.Family states food was for them.

## 2013-07-27 ENCOUNTER — Inpatient Hospital Stay (HOSPITAL_COMMUNITY)
Admission: AD | Admit: 2013-07-27 | Discharge: 2013-07-27 | Disposition: A | Discharge status: Home / Self Care | Payer: BC Managed Care – PPO | Source: Ambulatory Visit | Attending: Obstetrics | Admitting: Obstetrics

## 2013-07-27 ENCOUNTER — Encounter (HOSPITAL_COMMUNITY): Payer: Self-pay | Admitting: General Practice

## 2013-07-27 DIAGNOSIS — K5900 Constipation, unspecified: Secondary | ICD-10-CM | POA: Insufficient documentation

## 2013-07-27 DIAGNOSIS — E876 Hypokalemia: Secondary | ICD-10-CM | POA: Insufficient documentation

## 2013-07-27 DIAGNOSIS — O210 Mild hyperemesis gravidarum: Secondary | ICD-10-CM

## 2013-07-27 LAB — URINALYSIS, ROUTINE W REFLEX MICROSCOPIC
Glucose, UA: 100 mg/dL — AB
Hgb urine dipstick: NEGATIVE
Protein, ur: NEGATIVE mg/dL
Specific Gravity, Urine: 1.02 (ref 1.005–1.030)
Urobilinogen, UA: 1 mg/dL (ref 0.0–1.0)
pH: 7 (ref 5.0–8.0)

## 2013-07-27 LAB — CBC
HCT: 32.4 % — ABNORMAL LOW (ref 36.0–46.0)
MCHC: 36.1 g/dL — ABNORMAL HIGH (ref 30.0–36.0)
MCV: 78.1 fL (ref 78.0–100.0)
Platelets: 306 10*3/uL (ref 150–400)
RDW: 13.1 % (ref 11.5–15.5)
WBC: 6.4 10*3/uL (ref 4.0–10.5)

## 2013-07-27 LAB — URINE MICROSCOPIC-ADD ON

## 2013-07-27 LAB — COMPREHENSIVE METABOLIC PANEL
ALT: 13 U/L (ref 0–35)
AST: 18 U/L (ref 0–37)
Albumin: 3.3 g/dL — ABNORMAL LOW (ref 3.5–5.2)
BUN: 5 mg/dL — ABNORMAL LOW (ref 6–23)
Calcium: 9.1 mg/dL (ref 8.4–10.5)
Chloride: 100 mEq/L (ref 96–112)
Creatinine, Ser: 0.38 mg/dL — ABNORMAL LOW (ref 0.50–1.10)
GFR calc non Af Amer: >90 mL/min (ref >90)
Potassium: 3.2 mEq/L — ABNORMAL LOW (ref 3.5–5.1)
Sodium: 135 mEq/L (ref 135–145)
Total Protein: 7.3 g/dL (ref 6.0–8.3)

## 2013-07-27 MED ORDER — POTASSIUM CHLORIDE 2 MEQ/ML IV SOLN
Freq: Once | INTRAVENOUS | Status: AC
  Administered 2013-07-27: 18:00:00 via INTRAVENOUS
  Filled 2013-07-27: qty 1000

## 2013-07-27 MED ORDER — FAMOTIDINE IN NACL 20-0.9 MG/50ML-% IV SOLN
20.0000 mg | INTRAVENOUS | Status: AC
  Administered 2013-07-27: 20 mg via INTRAVENOUS
  Filled 2013-07-27: qty 50

## 2013-07-27 MED ORDER — GLYCOPYRROLATE 1 MG PO TABS
2.0000 mg | ORAL_TABLET | ORAL | Status: AC
  Administered 2013-07-27: 2 mg via ORAL
  Filled 2013-07-27: qty 2

## 2013-07-27 MED ORDER — PROMETHAZINE HCL 25 MG/ML IJ SOLN
25.0000 mg | Freq: Once | INTRAVENOUS | Status: AC
  Administered 2013-07-27: 25 mg via INTRAVENOUS
  Filled 2013-07-27: qty 1

## 2013-07-27 NOTE — MAU Note (Signed)
Patient states she has a lot of vomiting for the past 3 days. Is on IV therapy at home. States it is not working. States she has pain in her mid to upper abdomen and her throat. Denies bleeding, has a normal discharge.

## 2013-07-27 NOTE — MAU Provider Note (Signed)
Chief Complaint: Emesis During Pregnancy   First Provider Initiated Contact with Patient 07/27/13 1433     SUBJECTIVE HPI: Deanna Myers is a 22 y.o. G3P0020 at [redacted]w[redacted]d by LMP who presents to maternity admissions reporting hyperemesis of pregnancy.  She has been to MAU 6-7 times for this same complaint and was admitted twice per the pt.  She reports she cannot take phenergan suppositories because they cause diarrhea but reports she is currently constipated with no bowel movement x4 days.  She cannot keep down robinul to treat her ptyalism and is spitting into a bottle in MAU.  She currently has home health managing a Zofran pump but she cannot keep down food or fluids.  She reports she urinates twice per day usually and her skin feels dry all the time.  She denies LOF, vaginal bleeding, vaginal itching/burning, urinary symptoms, h/a, dizziness, or fever/chills.     Past Medical History  Diagnosis Date  . Asthma   . BV (bacterial vaginosis)   . Gonorrhea   . Chlamydia   . ADHD (attention deficit hyperactivity disorder)     since childhood   History reviewed. No pertinent past surgical history. History   Social History  . Marital Status: Single    Spouse Name: N/A    Number of Children: 0  . Years of Education: N/A   Occupational History  . Cook    Social History Main Topics  . Smoking status: Never Smoker   . Smokeless tobacco: Never Used  . Alcohol Use: No  . Drug Use: No  . Sexual Activity: Yes    Partners: Male     Comment: pregnant   Other Topics Concern  . Not on file   Social History Narrative  . No narrative on file   No current facility-administered medications on file prior to encounter.   Current Outpatient Prescriptions on File Prior to Encounter  Medication Sig Dispense Refill  . promethazine (PHENERGAN) 25 MG suppository Place 1 suppository (25 mg total) rectally every 6 (six) hours as needed for nausea. May take vaginally.  12 each  3   Allergies   Allergen Reactions  . Shellfish Allergy Anaphylaxis  . Adhesive [Tape] Swelling and Other (See Comments)    Causes bruising.  . Betadine [Povidone Iodine] Rash    ROS: Pertinent items in HPI  OBJECTIVE Blood pressure 104/71, pulse 97, temperature 98.7 F (37.1 C), temperature source Oral, resp. rate 16, weight 82.555 kg (182 lb), last menstrual period 04/16/2013, SpO2 99.00%. GENERAL: Well-developed, well-nourished female in no acute distress.  HEENT: Normocephalic HEART: normal rate RESP: normal effort ABDOMEN: Soft, non-tender EXTREMITIES: Nontender, no edema NEURO: Alert and oriented  FHR 163 by doppler  Pt weights in MAU: 10/7   195.4 lbs 10/15  182   lbs 10/24  182   Lbs   LAB RESULTS Results for orders placed during the hospital encounter of 07/27/13 (from the past 24 hour(s))  CBC     Status: Abnormal   Collection Time    07/27/13  4:06 PM      Result Value Range   WBC 6.4  4.0 - 10.5 K/uL   RBC 4.15  3.87 - 5.11 MIL/uL   Hemoglobin 11.7 (*) 12.0 - 15.0 g/dL   HCT 16.1 (*) 09.6 - 04.5 %   MCV 78.1  78.0 - 100.0 fL   MCH 28.2  26.0 - 34.0 pg   MCHC 36.1 (*) 30.0 - 36.0 g/dL   RDW 40.9  81.1 -  15.5 %   Platelets 306  150 - 400 K/uL  COMPREHENSIVE METABOLIC PANEL     Status: Abnormal   Collection Time    07/27/13  4:06 PM      Result Value Range   Sodium 135  135 - 145 mEq/L   Potassium 3.2 (*) 3.5 - 5.1 mEq/L   Chloride 100  96 - 112 mEq/L   CO2 24  19 - 32 mEq/L   Glucose, Bld 102 (*) 70 - 99 mg/dL   BUN 5 (*) 6 - 23 mg/dL   Creatinine, Ser 4.09 (*) 0.50 - 1.10 mg/dL   Calcium 9.1  8.4 - 81.1 mg/dL   Total Protein 7.3  6.0 - 8.3 g/dL   Albumin 3.3 (*) 3.5 - 5.2 g/dL   AST 18  0 - 37 U/L   ALT 13  0 - 35 U/L   Alkaline Phosphatase 44  39 - 117 U/L   Total Bilirubin 0.3  0.3 - 1.2 mg/dL   GFR calc non Af Amer >90  >90 mL/min   GFR calc Af Amer >90  >90 mL/min     ASSESSMENT 1. Hyperemesis complicating pregnancy, antepartum   2.  Mild  hypokalemia 3.  Stable weight x9 days  PLAN Phenergan 25 mg IV in D5LR x1000 ml; Pepcid 20 mg IV; Multivitamin IV plus 10 meq of K; Robinul 2 mg PO x1 in MAU Discharge home Recommend pt use phenergan rectally when constipated, use vaginally when loose stools F/U in office as scheduled Return to MAU as needed    Medication List    ASK your doctor about these medications       acetaminophen 500 MG tablet  Commonly known as:  TYLENOL  Take 500 mg by mouth every 6 (six) hours as needed for pain.     diphenhydrAMINE 25 MG tablet  Commonly known as:  BENADRYL  Take 25 mg by mouth every 6 (six) hours as needed for allergies.     promethazine 25 MG suppository  Commonly known as:  PHENERGAN  Place 1 suppository (25 mg total) rectally every 6 (six) hours as needed for nausea. May take vaginally.     ZOFRAN 2 MG/ML Soln injection  Generic drug:  Ondansetron HCl  Inject 1 mg into the vein every hour. Continuous infusion through CADD pump.         Sharen Counter Certified Nurse-Midwife 07/27/2013  7:48 PM

## 2013-07-29 LAB — URINE CULTURE

## 2013-08-22 ENCOUNTER — Inpatient Hospital Stay (HOSPITAL_COMMUNITY)
Admission: AD | Admit: 2013-08-22 | Discharge: 2013-08-22 | Disposition: A | Discharge status: Home / Self Care | Payer: BC Managed Care – PPO | Source: Ambulatory Visit | Attending: Obstetrics | Admitting: Obstetrics

## 2013-08-22 ENCOUNTER — Encounter (HOSPITAL_COMMUNITY): Payer: Self-pay | Admitting: *Deleted

## 2013-08-22 DIAGNOSIS — R109 Unspecified abdominal pain: Secondary | ICD-10-CM | POA: Insufficient documentation

## 2013-08-22 DIAGNOSIS — K117 Disturbances of salivary secretion: Secondary | ICD-10-CM | POA: Insufficient documentation

## 2013-08-22 DIAGNOSIS — O9989 Other specified diseases and conditions complicating pregnancy, childbirth and the puerperium: Secondary | ICD-10-CM | POA: Insufficient documentation

## 2013-08-22 DIAGNOSIS — M7918 Myalgia, other site: Secondary | ICD-10-CM

## 2013-08-22 DIAGNOSIS — O219 Vomiting of pregnancy, unspecified: Secondary | ICD-10-CM

## 2013-08-22 DIAGNOSIS — O210 Mild hyperemesis gravidarum: Secondary | ICD-10-CM

## 2013-08-22 DIAGNOSIS — IMO0001 Reserved for inherently not codable concepts without codable children: Secondary | ICD-10-CM | POA: Insufficient documentation

## 2013-08-22 LAB — URINALYSIS, ROUTINE W REFLEX MICROSCOPIC
Glucose, UA: NEGATIVE mg/dL
Ketones, ur: NEGATIVE mg/dL
Leukocytes, UA: NEGATIVE
Nitrite: NEGATIVE
Urobilinogen, UA: 0.2 mg/dL (ref 0.0–1.0)
pH: 7 (ref 5.0–8.0)

## 2013-08-22 MED ORDER — GLYCOPYRROLATE 0.2 MG/ML IJ SOLN
0.1000 mg | Freq: Once | INTRAMUSCULAR | Status: AC
  Administered 2013-08-22: 0.1 mg via INTRAVENOUS
  Filled 2013-08-22: qty 0.5

## 2013-08-22 MED ORDER — PROMETHAZINE HCL 25 MG RE SUPP: 25.0000 mg | Freq: Four times a day (QID) | RECTAL | Status: DC | PRN

## 2013-08-22 MED ORDER — DIMENHYDRINATE 50 MG PO TABS: 50.0000 mg | ORAL_TABLET | Freq: Four times a day (QID) | ORAL | Status: DC | PRN

## 2013-08-22 MED ORDER — PROMETHAZINE HCL 25 MG/ML IJ SOLN
25.0000 mg | Freq: Once | INTRAVENOUS | Status: AC
  Administered 2013-08-22: 25 mg via INTRAVENOUS
  Filled 2013-08-22: qty 1

## 2013-08-22 MED ORDER — GLYCOPYRROLATE 1 MG PO TABS: 2.0000 mg | ORAL_TABLET | Freq: Three times a day (TID) | ORAL | Status: DC

## 2013-08-22 MED ORDER — FAMOTIDINE IN NACL 20-0.9 MG/50ML-% IV SOLN
20.0000 mg | Freq: Once | INTRAVENOUS | Status: AC
  Administered 2013-08-22: 20 mg via INTRAVENOUS
  Filled 2013-08-22: qty 50

## 2013-08-22 MED ORDER — M.V.I. ADULT IV INJ
Freq: Once | INTRAVENOUS | Status: AC
  Administered 2013-08-22: 19:00:00 via INTRAVENOUS
  Filled 2013-08-22: qty 1000

## 2013-08-22 NOTE — Progress Notes (Signed)
Pt states when she feels it when she is spitting , it feels like a pulling from  Her stomach.

## 2013-08-22 NOTE — MAU Note (Signed)
Pt states she has been having nausea and vomiting. Pt states she  Has not gained weight and is losing weight. Pt states she was also given "spitting medication" in the Iv, that medication works and the "pills" she gets at home does not work

## 2013-08-22 NOTE — MAU Provider Note (Signed)
History     CSN: [id number]  Arrival date and time: 08/22/13 1449   First Provider Initiated Contact with Patient 08/22/13 1536      Chief Complaint  Patient presents with  . Morning Sickness  . Abdominal Pain   HPI [name] is a 22 y.o. G3P0020 at [redacted]w[redacted]d who presents to MAU today with complaint of N/V and spitting. The patient states that this has been constant throughout the pregnancy. She feels that she has had less vomiting since the end of October, but is concerned that she still does not have a normal appetite. She is also concerned that she continues to spit and hasn't gained weight. She denies fever, diarrhea, vaginal bleeding, discharge or UTI symptoms. She does have occasional "pulling" pains in her mid abdomen when she spits and vomits.   OB History   Grav Para Term Preterm Abortions TAB SAB Ect Mult Living   3    2  2          Past Medical History  Diagnosis Date  . Asthma   . BV (bacterial vaginosis)   . Gonorrhea   . Chlamydia   . ADHD (attention deficit hyperactivity disorder)     since childhood    History reviewed. No pertinent past surgical history.  Family History  Problem Relation Age of Onset  . Cervical cancer Paternal Aunt   . Hypertension Father   . Congestive Heart Failure Maternal Grandmother   . Hypertension Mother     History  Substance Use Topics  . Smoking status: Never Smoker   . Smokeless tobacco: Never Used  . Alcohol Use: No    Allergies:  Allergies  Allergen Reactions  . Shellfish Allergy Anaphylaxis  . Adhesive [Tape] Swelling and Other (See Comments)    Causes bruising.  . Betadine [Povidone Iodine] Rash    Prescriptions prior to admission  Medication Sig Dispense Refill  . acetaminophen (TYLENOL) 500 MG tablet Take 500 mg by mouth every 6 (six) hours as needed for pain.      . diphenhydrAMINE (BENADRYL) 25 MG tablet Take 25 mg by mouth every 6 (six) hours as needed for allergies.      . promethazine  (PHENERGAN) 25 MG suppository Place 1 suppository (25 mg total) rectally every 6 (six) hours as needed for nausea. May take vaginally.  12 each  3    Review of Systems  Constitutional: Negative for fever and malaise/fatigue.  Gastrointestinal: Positive for nausea, vomiting and abdominal pain. Negative for diarrhea and constipation.  Genitourinary: Negative for dysuria, urgency and frequency.       Neg - vaginal bleeding, discharge   Physical Exam   Last menstrual period 04/16/2013.  Physical Exam  Constitutional: She is oriented to person, place, and time. She appears well-developed and well-nourished. No distress.  HENT:  Head: Normocephalic and atraumatic.  Cardiovascular: Normal rate.   Respiratory: Effort normal.  GI: Soft. She exhibits no distension and no mass. There is no tenderness. There is no rebound and no guarding.  Neurological: She is alert and oriented to person, place, and time.  Skin: Skin is warm and dry. No erythema.  Psychiatric: She has a normal mood and affect.   Results for orders placed during the hospital encounter of 08/22/13 (from the past 24 hour(s))  URINALYSIS, ROUTINE W REFLEX MICROSCOPIC     Status: Abnormal   Collection Time    08/22/13  5:30 PM      Result Value Range  Color, Urine YELLOW  YELLOW   APPearance CLEAR  CLEAR   Specific Gravity, Urine <1.005 (*) 1.005 - 1.030   pH 7.0  5.0 - 8.0   Glucose, UA NEGATIVE  NEGATIVE mg/dL   Hgb urine dipstick NEGATIVE  NEGATIVE   Bilirubin Urine NEGATIVE  NEGATIVE   Ketones, ur NEGATIVE  NEGATIVE mg/dL   Protein, ur NEGATIVE  NEGATIVE mg/dL   Urobilinogen, UA 0.2  0.0 - 1.0 mg/dL   Nitrite NEGATIVE  NEGATIVE   Leukocytes, UA NEGATIVE  NEGATIVE    MAU Course  Procedures None  MDM Patient was unable to give a urine sample upon arrival in MAU Patient gave sample for UA after first liter of fluid Patient given IV Phenergan infusion with 1 liter D5LR and 20 mg Pepcid IV Patient given 1 liter IV  LR with MV Patient able to tolerate PO in MAU 2000 - care turned over to [place], CNM  Feeling better. Tolerating POs.  Freddi Starr, PA-C  08/22/2013, 8:07 PM  Assessment and Plan   1. Nausea and vomiting in pregnancy prior to [redacted] weeks gestation   2. Ptyalism   3. Musculoskeletal pain       Medication List         acetaminophen 500 MG tablet  Commonly known as:  TYLENOL  Take 500 mg by mouth every 6 (six) hours as needed for pain.     dimenhyDRINATE 50 MG tablet  Commonly known as:  DRAMAMINE  Take 1 tablet (50 mg total) by mouth every 6 (six) hours as needed.     diphenhydrAMINE 25 MG tablet  Commonly known as:  BENADRYL  Take 25 mg by mouth every 6 (six) hours as needed for allergies.     glycopyrrolate 1 MG tablet  Commonly known as:  ROBINUL  Take 2 tablets (2 mg total) by mouth 3 (three) times daily.     promethazine 25 MG suppository  Commonly known as:  PHENERGAN  Place 1 suppository (25 mg total) rectally every 6 (six) hours as needed for nausea. May take vaginally.       Follow-up Information   Follow up with MARSHALL,BERNARD A, MD. (as scheduled or as needed if symptoms worsen)    Specialty:  Obstetrics and Gynecology   Contact information:   [address] [place] [address] [phone]       Follow up with THE [facility] OF [place] MATERNITY ADMISSIONS. (As needed in emergencies)    Contact information:   [address] [id number] [place] [address] [phone]     Dorathy Kinsman, [place] 08/22/2013 9:09 PM

## 2013-08-22 NOTE — Progress Notes (Signed)
Pt states she has dizziness when she stands up.

## 2013-09-02 ENCOUNTER — Inpatient Hospital Stay (HOSPITAL_COMMUNITY)
Admission: AD | Admit: 2013-09-02 | Discharge: 2013-09-02 | Disposition: A | Discharge status: Home / Self Care | Payer: Medicaid Other | Source: Ambulatory Visit | Attending: Obstetrics | Admitting: Obstetrics

## 2013-09-02 ENCOUNTER — Encounter (HOSPITAL_COMMUNITY): Payer: Self-pay | Admitting: *Deleted

## 2013-09-02 DIAGNOSIS — O210 Mild hyperemesis gravidarum: Secondary | ICD-10-CM

## 2013-09-02 DIAGNOSIS — O219 Vomiting of pregnancy, unspecified: Secondary | ICD-10-CM

## 2013-09-02 LAB — URINE MICROSCOPIC-ADD ON

## 2013-09-02 LAB — URINALYSIS, ROUTINE W REFLEX MICROSCOPIC
Bilirubin Urine: NEGATIVE
Hgb urine dipstick: NEGATIVE
Ketones, ur: 40 mg/dL — AB
Nitrite: NEGATIVE
Protein, ur: NEGATIVE mg/dL
Specific Gravity, Urine: <1.005 — ABNORMAL LOW (ref 1.005–1.030)
Urobilinogen, UA: 0.2 mg/dL (ref 0.0–1.0)

## 2013-09-02 MED ORDER — PROMETHAZINE HCL 25 MG/ML IJ SOLN
25.0000 mg | Freq: Once | INTRAMUSCULAR | Status: AC
  Administered 2013-09-02: 25 mg via INTRAVENOUS
  Filled 2013-09-02: qty 1

## 2013-09-02 MED ORDER — LACTATED RINGERS IV SOLN
Freq: Once | INTRAVENOUS | Status: DC
  Administered 2013-09-02: 20:00:00 via INTRAVENOUS
  Filled 2013-09-02: qty 1000

## 2013-09-02 MED ORDER — FAMOTIDINE IN NACL 20-0.9 MG/50ML-% IV SOLN
20.0000 mg | Freq: Once | INTRAVENOUS | Status: AC
  Administered 2013-09-02: 20 mg via INTRAVENOUS
  Filled 2013-09-02: qty 50

## 2013-09-02 MED ORDER — HYDROMORPHONE HCL PF 1 MG/ML IJ SOLN
1.0000 mg | Freq: Once | INTRAMUSCULAR | Status: AC
  Administered 2013-09-02: 1 mg via INTRAMUSCULAR
  Filled 2013-09-02: qty 1

## 2013-09-02 MED ORDER — PROCHLORPERAZINE MALEATE 10 MG PO TABS: 10.0000 mg | ORAL_TABLET | Freq: Four times a day (QID) | ORAL | Status: DC | PRN

## 2013-09-02 MED ORDER — PANTOPRAZOLE SODIUM 40 MG PO TBEC: 40.0000 mg | DELAYED_RELEASE_TABLET | Freq: Every day | ORAL | Status: DC

## 2013-09-02 MED ORDER — LACTATED RINGERS IV BOLUS (SEPSIS)
1000.0000 mL | Freq: Once | INTRAVENOUS | Status: AC
  Administered 2013-09-02: 1000 mL via INTRAVENOUS

## 2013-09-02 NOTE — MAU Note (Signed)
Patient presents with complaints of headache, vomiting and spitting X 2 months.

## 2013-09-02 NOTE — MAU Note (Signed)
Patient did not void on admission.

## 2013-09-02 NOTE — MAU Note (Signed)
Pt states here for hyperemesis, is spitting in a bottle, has thick heavy vaginal discharge, denies itching or burning. Notes mucus after wiping. Has had headache all day. Urine is reddish and has mild odor.

## 2013-09-02 NOTE — MAU Provider Note (Signed)
History     CSN: [id number]  Arrival date and time: 09/02/13 1722   First Provider Initiated Contact with Patient 09/02/13 1827      Chief Complaint  Patient presents with  . Hyperemesis Gravidarum   HPI Comments: Deanna Myers 22 y.o. [id number] presents to MAU for nausea, vomiting and spitting. This is a reoccurring issue for her. She is a patient of Dr Gaynell Face. States she does well for awhile then the spitting becomes an issue for her.         Past Medical History  Diagnosis Date  . Asthma   . BV (bacterial vaginosis)   . Gonorrhea   . Chlamydia   . ADHD (attention deficit hyperactivity disorder)     since childhood    Past Surgical History  Procedure Laterality Date  . No past surgeries      Family History  Problem Relation Age of Onset  . Cervical cancer Paternal Aunt   . Hypertension Father   . Congestive Heart Failure Maternal Grandmother   . Hypertension Mother     History  Substance Use Topics  . Smoking status: Never Smoker   . Smokeless tobacco: Never Used  . Alcohol Use: No    Allergies:  Allergies  Allergen Reactions  . Shellfish Allergy Anaphylaxis  . Adhesive [Tape] Swelling and Other (See Comments)    Causes bruising.  . Betadine [Povidone Iodine] Rash    Prescriptions prior to admission  Medication Sig Dispense Refill  . acetaminophen (TYLENOL) 500 MG tablet Take 500 mg by mouth every 6 (six) hours as needed for pain.      Marland Kitchen glycopyrrolate (ROBINUL) 1 MG tablet Take 2 mg by mouth 3 (three) times daily.      . promethazine (PHENERGAN) 25 MG suppository Place 1 suppository (25 mg total) rectally every 6 (six) hours as needed for nausea. May take vaginally.  12 each  3    ROS Physical Exam   Blood pressure 127/76, pulse 113, temperature 98.1 F (36.7 C), temperature source Oral, resp. rate 20, height 5\' 6"  (1.676 m), weight 81.761 kg (180 lb 4 oz), last menstrual period 04/16/2013.  Physical Exam  MAU Course   Procedures  MDM  IVF X 2 liters Phenergan 25 mg Pepcid 20 mg IVPB  Results for orders placed during the hospital encounter of 09/02/13 (from the past 24 hour(s))  URINALYSIS, ROUTINE W REFLEX MICROSCOPIC     Status: Abnormal   Collection Time    09/02/13  8:50 PM      Result Value Range   Color, Urine YELLOW  YELLOW   APPearance CLEAR  CLEAR   Specific Gravity, Urine <1.005 (*) 1.005 - 1.030   pH 7.0  5.0 - 8.0   Glucose, UA NEGATIVE  NEGATIVE mg/dL   Hgb urine dipstick NEGATIVE  NEGATIVE   Bilirubin Urine NEGATIVE  NEGATIVE   Ketones, ur 40 (*) NEGATIVE mg/dL   Protein, ur NEGATIVE  NEGATIVE mg/dL   Urobilinogen, UA 0.2  0.0 - 1.0 mg/dL   Nitrite NEGATIVE  NEGATIVE   Leukocytes, UA TRACE (*) NEGATIVE  URINE MICROSCOPIC-ADD ON     Status: Abnormal   Collection Time    09/02/13  8:50 PM      Result Value Range   Squamous Epithelial / LPF FEW (*) RARE   WBC, UA 0-2  <3 WBC/hpf   RBC / HPF 0-2  <3 RBC/hpf   Bacteria, UA FEW (*) RARE   2118: patient has not  vomited since being here. She has continued to spit. Will dc home with compazine.   Assessment and Plan   Care turned over to [facility] CNM  1. Nausea and vomiting in pregnancy prior to [redacted] weeks gestation      Medication List         acetaminophen 500 MG tablet  Commonly known as:  TYLENOL  Take 500 mg by mouth every 6 (six) hours as needed for pain.     glycopyrrolate 1 MG tablet  Commonly known as:  ROBINUL  Take 2 mg by mouth 3 (three) times daily.     prochlorperazine 10 MG tablet  Commonly known as:  COMPAZINE  Take 1 tablet (10 mg total) by mouth every 6 (six) hours as needed for nausea or vomiting.     promethazine 25 MG suppository  Commonly known as:  PHENERGAN  Place 1 suppository (25 mg total) rectally every 6 (six) hours as needed for nausea. May take vaginally.       Follow-up Information   Follow up with Kathreen Cosier, MD. (as scheduled )    Specialty:  Obstetrics and  Gynecology   Contact information:   [address] [place] [address] [phone]         Carolynn Serve 09/02/2013, 6:41 PM

## 2013-09-02 NOTE — MAU Note (Signed)
2nd bag of LR with 25mg  of phenergan hung due to patient not having voided since arrival on unit per Jannifer Rodney, NP request.

## 2013-09-05 ENCOUNTER — Encounter (HOSPITAL_COMMUNITY): Payer: Self-pay | Admitting: *Deleted

## 2013-09-05 ENCOUNTER — Inpatient Hospital Stay (HOSPITAL_COMMUNITY)
Admission: AD | Admit: 2013-09-05 | Discharge: 2013-09-05 | Disposition: A | Discharge status: Home / Self Care | Payer: Medicaid Other | Source: Ambulatory Visit | Attending: Obstetrics | Admitting: Obstetrics

## 2013-09-05 DIAGNOSIS — O219 Vomiting of pregnancy, unspecified: Secondary | ICD-10-CM

## 2013-09-05 DIAGNOSIS — O99891 Other specified diseases and conditions complicating pregnancy: Secondary | ICD-10-CM | POA: Insufficient documentation

## 2013-09-05 DIAGNOSIS — O210 Mild hyperemesis gravidarum: Secondary | ICD-10-CM | POA: Insufficient documentation

## 2013-09-05 DIAGNOSIS — K219 Gastro-esophageal reflux disease without esophagitis: Secondary | ICD-10-CM | POA: Insufficient documentation

## 2013-09-05 LAB — URINALYSIS, ROUTINE W REFLEX MICROSCOPIC
Hgb urine dipstick: NEGATIVE
Ketones, ur: 15 mg/dL — AB
Leukocytes, UA: NEGATIVE
Nitrite: NEGATIVE
Protein, ur: 30 mg/dL — AB
Urobilinogen, UA: 1 mg/dL (ref 0.0–1.0)
pH: 6 (ref 5.0–8.0)

## 2013-09-05 MED ORDER — PROMETHAZINE HCL 25 MG/ML IJ SOLN
Freq: Once | INTRAVENOUS | Status: DC
  Filled 2013-09-05: qty 1000

## 2013-09-05 MED ORDER — PROMETHAZINE HCL 25 MG/ML IJ SOLN
25.0000 mg | Freq: Once | INTRAMUSCULAR | Status: AC
  Administered 2013-09-05: 25 mg via INTRAMUSCULAR
  Filled 2013-09-05: qty 1

## 2013-09-05 NOTE — MAU Note (Signed)
Pt started vomited yesterday and has been taking her medicine given here.  Nothing is helping.  Pt states she hasn't had a BM x 1 week.

## 2013-09-05 NOTE — MAU Provider Note (Signed)
History     CSN: [id number]  Arrival date and time: 09/05/13 1750   First Provider Initiated Contact with Patient 09/05/13 1903      Chief Complaint  Patient presents with  . Emesis During Pregnancy   HPI Comments: Deanna Myers 22 y.o. [id number] presents to MAU for nausea, vomiting and spitting. Multiple visits. She had done well with compazine for 2 days then spitting started. She plans to see [facility] for GI consult due to acid reflux which she believes is the problem.     Past Medical History  Diagnosis Date  . Asthma   . BV (bacterial vaginosis)   . Gonorrhea   . Chlamydia   . ADHD (attention deficit hyperactivity disorder)     since childhood    Past Surgical History  Procedure Laterality Date  . No past surgeries      Family History  Problem Relation Age of Onset  . Cervical cancer Paternal Aunt   . Hypertension Father   . Congestive Heart Failure Maternal Grandmother   . Hypertension Mother     History  Substance Use Topics  . Smoking status: Never Smoker   . Smokeless tobacco: Never Used  . Alcohol Use: No    Allergies:  Allergies  Allergen Reactions  . Shellfish Allergy Anaphylaxis  . Adhesive [Tape] Swelling and Other (See Comments)    Causes bruising.  . Betadine [Povidone Iodine] Rash    Prescriptions prior to admission  Medication Sig Dispense Refill  . acetaminophen (TYLENOL) 500 MG tablet Take 500 mg by mouth every 6 (six) hours as needed for pain.      Marland Kitchen glycopyrrolate (ROBINUL) 1 MG tablet Take 2 mg by mouth 3 (three) times daily.      . pantoprazole (PROTONIX) 40 MG tablet Take 1 tablet (40 mg total) by mouth daily.  30 tablet  0  . prochlorperazine (COMPAZINE) 10 MG tablet Take 1 tablet (10 mg total) by mouth every 6 (six) hours as needed for nausea or vomiting.  30 tablet  0  . promethazine (PHENERGAN) 25 MG suppository Place 1 suppository (25 mg total) rectally every 6 (six) hours as needed for nausea. May take vaginally.  12 each   3    Review of Systems  Constitutional: Negative.   HENT: Negative.   Eyes: Negative.   Respiratory: Negative.   Cardiovascular: Negative.   Gastrointestinal: Positive for heartburn, nausea, vomiting and constipation.  Genitourinary: Negative.   Musculoskeletal: Negative.   Skin: Negative.   Neurological: Negative.   Endo/Heme/Allergies: Negative.   Psychiatric/Behavioral: Negative.    Physical Exam   Blood pressure 111/62, pulse 82, temperature 99.2 F (37.3 C), temperature source Oral, resp. rate 18, last menstrual period 04/16/2013, SpO2 100.00%.  Physical Exam  Constitutional: She appears well-developed and well-nourished.  HENT:  Head: Normocephalic and atraumatic.  Cardiovascular: Normal rate and regular rhythm.   Respiratory: Effort normal and breath sounds normal.  GI: Soft. Bowel sounds are normal. She exhibits no distension. There is no tenderness.  Musculoskeletal: Normal range of motion.  Neurological: She is alert.  Psychiatric: Her mood appears anxious. Her affect is inappropriate.  Demanding   Results for orders placed during the hospital encounter of 09/05/13 (from the past 24 hour(s))  URINALYSIS, ROUTINE W REFLEX MICROSCOPIC     Status: Abnormal   Collection Time    09/05/13  6:38 PM      Result Value Range   Color, Urine YELLOW  YELLOW   APPearance CLOUDY (*)  CLEAR   Specific Gravity, Urine >1.030 (*) 1.005 - 1.030   pH 6.0  5.0 - 8.0   Glucose, UA NEGATIVE  NEGATIVE mg/dL   Hgb urine dipstick NEGATIVE  NEGATIVE   Bilirubin Urine SMALL (*) NEGATIVE   Ketones, ur 15 (*) NEGATIVE mg/dL   Protein, ur 30 (*) NEGATIVE mg/dL   Urobilinogen, UA 1.0  0.0 - 1.0 mg/dL   Nitrite NEGATIVE  NEGATIVE   Leukocytes, UA NEGATIVE  NEGATIVE  URINE MICROSCOPIC-ADD ON     Status: Abnormal   Collection Time    09/05/13  6:38 PM      Result Value Range   Squamous Epithelial / LPF MANY (*) RARE   WBC, UA 3-6  <3 WBC/hpf   RBC / HPF 0-2  <3 RBC/hpf   Bacteria, UA  RARE  RARE   Urine-Other MUCOUS PRESENT       MAU Course  Procedures  MDM  IVF LR with phenergan 25 mg  Assessment and Plan   A: Dehydration in second trimester  P: IVFluids Care turned over to Deanna Myers, CNM  Jannifer Rodney, NP  Care of pt assumed by Deanna Myers, CNM at 2100. Unable to start IV w/ multiple attempts. Pt requesting broth, hot chocolate. Will give IM phenergan.  Nausea improved. No vomiting. Keeping down fluids.  ASSESSMENT: 1. Nausea and vomiting in pregnancy prior to [redacted] weeks gestation    PLAN: D/C home in stable condition. Advance diet slowly. Follow-up Information   Follow up with MARSHALL,BERNARD A, MD. (as scheduled or as needed if symptoms worsen)    Specialty:  Obstetrics and Gynecology   Contact information:   [address] [place] [address] [phone]        Medication List         acetaminophen 500 MG tablet  Commonly known as:  TYLENOL  Take 500 mg by mouth every 6 (six) hours as needed for pain.     glycopyrrolate 1 MG tablet  Commonly known as:  ROBINUL  Take 2 mg by mouth 3 (three) times daily.     pantoprazole 40 MG tablet  Commonly known as:  PROTONIX  Take 1 tablet (40 mg total) by mouth daily.     prochlorperazine 10 MG tablet  Commonly known as:  COMPAZINE  Take 1 tablet (10 mg total) by mouth every 6 (six) hours as needed for nausea or vomiting.     promethazine 25 MG suppository  Commonly known as:  PHENERGAN  Place 1 suppository (25 mg total) rectally every 6 (six) hours as needed for nausea. May take vaginally.        Deanna Myers 09/05/2013, 9:54 PM

## 2013-09-23 ENCOUNTER — Inpatient Hospital Stay (HOSPITAL_COMMUNITY)
Admission: AD | Admit: 2013-09-23 | Discharge: 2013-09-23 | Disposition: A | Discharge status: Home / Self Care | Payer: Medicaid Other | Source: Ambulatory Visit | Attending: Obstetrics | Admitting: Obstetrics

## 2013-09-23 ENCOUNTER — Encounter (HOSPITAL_COMMUNITY): Payer: Self-pay | Admitting: *Deleted

## 2013-09-23 DIAGNOSIS — O2390 Unspecified genitourinary tract infection in pregnancy, unspecified trimester: Secondary | ICD-10-CM | POA: Insufficient documentation

## 2013-09-23 DIAGNOSIS — R109 Unspecified abdominal pain: Secondary | ICD-10-CM | POA: Insufficient documentation

## 2013-09-23 DIAGNOSIS — N390 Urinary tract infection, site not specified: Secondary | ICD-10-CM

## 2013-09-23 DIAGNOSIS — O210 Mild hyperemesis gravidarum: Secondary | ICD-10-CM | POA: Insufficient documentation

## 2013-09-23 LAB — URINE MICROSCOPIC-ADD ON

## 2013-09-23 LAB — WET PREP, GENITAL
Clue Cells Wet Prep HPF POC: NONE SEEN
Trich, Wet Prep: NONE SEEN

## 2013-09-23 LAB — URINALYSIS, ROUTINE W REFLEX MICROSCOPIC
Glucose, UA: NEGATIVE mg/dL
Hgb urine dipstick: NEGATIVE
Specific Gravity, Urine: >1.03 — ABNORMAL HIGH (ref 1.005–1.030)
pH: 6 (ref 5.0–8.0)

## 2013-09-23 MED ORDER — DEXTROSE-NACL 5-0.9 % IV SOLN: Freq: Once | INTRAVENOUS | Status: DC

## 2013-09-23 MED ORDER — CEPHALEXIN 500 MG PO CAPS: 500.0000 mg | ORAL_CAPSULE | Freq: Three times a day (TID) | ORAL | Status: DC

## 2013-09-23 MED ORDER — DEXTROSE 5 % IV SOLN
1.0000 g | Freq: Once | INTRAVENOUS | Status: AC
  Administered 2013-09-23: 1 g via INTRAVENOUS
  Filled 2013-09-23: qty 10

## 2013-09-23 MED ORDER — PROMETHAZINE HCL 25 MG/ML IJ SOLN: 25.0000 mg | Freq: Once | INTRAVENOUS | Status: DC

## 2013-09-23 MED ORDER — DEXTROSE 5 % IN LACTATED RINGERS IV BOLUS: 1000.0000 mL | Freq: Once | INTRAVENOUS | Status: DC

## 2013-09-23 MED ORDER — PROMETHAZINE HCL 12.5 MG PO TABS: 12.5000 mg | ORAL_TABLET | Freq: Four times a day (QID) | ORAL | Status: DC | PRN

## 2013-09-23 MED ORDER — ONDANSETRON HCL 4 MG/2ML IJ SOLN: 4.0000 mg | Freq: Once | INTRAMUSCULAR | Status: DC

## 2013-09-23 MED ORDER — DEXTROSE 5 % IN LACTATED RINGERS IV BOLUS
1000.0000 mL | Freq: Once | INTRAVENOUS | Status: AC
  Administered 2013-09-23: 1000 mL via INTRAVENOUS

## 2013-09-23 MED ORDER — DEXTROSE-NACL 5-0.9 % IV SOLN: INTRAVENOUS | Status: DC

## 2013-09-23 MED ORDER — PROMETHAZINE HCL 25 MG/ML IJ SOLN
25.0000 mg | Freq: Once | INTRAMUSCULAR | Status: AC
  Administered 2013-09-23: 25 mg via INTRAVENOUS
  Filled 2013-09-23: qty 1

## 2013-09-23 NOTE — MAU Note (Signed)
Patient presents with complaints of nausea and vomiting since first 1-11/2 months of pregnancy; abdominal pain X 1 month and spotting today. Patient states that she can no longer tolerate this and wants her labor induced. When it was explained to the patient that it was extremely early to discuss induction of labor and that the fetus is not yet viable and her labor could not be induced until [redacted] weeks gestation, patient stated that she would have to induce her own labor as she could no longer tolerate this.

## 2013-09-23 NOTE — MAU Provider Note (Signed)
History     CSN: [id number]  Arrival date and time: 09/23/13 1412   First Provider Initiated Contact with Patient 09/23/13 1600      Chief Complaint  Patient presents with  . Nausea  . Emesis  . Vaginal Bleeding  . Abdominal Pain   Emesis  Associated symptoms include abdominal pain.  Vaginal Bleeding Associated symptoms include abdominal pain and vomiting.  Abdominal Pain Associated symptoms include vomiting.    Deanna Myers is a 22 y.o. G3P0020 at [redacted]w[redacted]d who presents today with nausea and vomiting. She states that she has hyperemesis, and she can't keep anything down. She has not been able to keep anything down for the past "few" days. She has also had abdominal pain for days as well. She denies any vaginal discharge. She had one episode of spotting when she wiped earlier today. She has an appointment with Dr. Gaynell Face on 10/10/13. She denies any fever or back pain.   Past Medical History  Diagnosis Date  . Asthma   . BV (bacterial vaginosis)   . Gonorrhea   . Chlamydia   . ADHD (attention deficit hyperactivity disorder)     since childhood    Past Surgical History  Procedure Laterality Date  . No past surgeries      Family History  Problem Relation Age of Onset  . Cervical cancer Paternal Aunt   . Hypertension Father   . Congestive Heart Failure Maternal Grandmother   . Hypertension Mother     History  Substance Use Topics  . Smoking status: Never Smoker   . Smokeless tobacco: Never Used  . Alcohol Use: No    Allergies:  Allergies  Allergen Reactions  . Shellfish Allergy Anaphylaxis  . Adhesive [Tape] Swelling and Other (See Comments)    Causes bruising.  . Betadine [Povidone Iodine] Rash    Prescriptions prior to admission  Medication Sig Dispense Refill  . acetaminophen (TYLENOL) 500 MG tablet Take 500 mg by mouth every 6 (six) hours as needed for pain.      Marland Kitchen glycopyrrolate (ROBINUL) 1 MG tablet Take 2 mg by mouth 3 (three) times daily.       . pantoprazole (PROTONIX) 40 MG tablet Take 1 tablet (40 mg total) by mouth daily.  30 tablet  0  . prochlorperazine (COMPAZINE) 10 MG tablet Take 1 tablet (10 mg total) by mouth every 6 (six) hours as needed for nausea or vomiting.  30 tablet  0    Review of Systems  Gastrointestinal: Positive for vomiting and abdominal pain.  Genitourinary: Positive for vaginal bleeding.   Physical Exam   Blood pressure 117/69, pulse 94, temperature 98.2 F (36.8 C), temperature source Oral, resp. rate 16, height 5\' 6"  (1.676 m), weight 81.194 kg (179 lb), last menstrual period 04/16/2013.  Physical Exam  Nursing note and vitals reviewed. Constitutional: She is oriented to person, place, and time. She appears well-developed and well-nourished. No distress.  Cardiovascular: Normal rate.   Respiratory: Effort normal.  GI: Soft. There is no tenderness.  Genitourinary:   Moderate amount of thick white discharge Cervix: closed/thick/high  Neurological: She is alert and oriented to person, place, and time.  Skin: Skin is warm and dry.  Psychiatric: She has a normal mood and affect.    MAU Course  Procedures  Results for orders placed during the hospital encounter of 09/23/13 (from the past 24 hour(s))  URINALYSIS, ROUTINE W REFLEX MICROSCOPIC     Status: Abnormal   Collection Time  09/23/13  2:25 PM      Result Value Range   Color, Urine AMBER (*) YELLOW   APPearance HAZY (*) CLEAR   Specific Gravity, Urine >1.030 (*) 1.005 - 1.030   pH 6.0  5.0 - 8.0   Glucose, UA NEGATIVE  NEGATIVE mg/dL   Hgb urine dipstick NEGATIVE  NEGATIVE   Bilirubin Urine MODERATE (*) NEGATIVE   Ketones, ur >80 (*) NEGATIVE mg/dL   Protein, ur 147 (*) NEGATIVE mg/dL   Urobilinogen, UA 1.0  0.0 - 1.0 mg/dL   Nitrite POSITIVE (*) NEGATIVE   Leukocytes, UA TRACE (*) NEGATIVE  URINE MICROSCOPIC-ADD ON     Status: Abnormal   Collection Time    09/23/13  2:25 PM      Result Value Range   Squamous Epithelial / LPF  MANY (*) RARE   WBC, UA 3-6  <3 WBC/hpf   Bacteria, UA FEW (*) RARE   Urine-Other MUCOUS PRESENT    WET PREP, GENITAL     Status: Abnormal   Collection Time    09/23/13  4:12 PM      Result Value Range   Yeast Wet Prep HPF POC NONE SEEN  NONE SEEN   Trich, Wet Prep NONE SEEN  NONE SEEN   Clue Cells Wet Prep HPF POC NONE SEEN  NONE SEEN   WBC, Wet Prep HPF POC FEW (*) NONE SEEN    1840: Patient is feeling better. Nausea has resolved  Assessment and Plan   1. Hyperemesis gravidarum   2. UTI (lower urinary tract infection)      Medication List         acetaminophen 500 MG tablet  Commonly known as:  TYLENOL  Take 500 mg by mouth every 6 (six) hours as needed for pain.     cephALEXin 500 MG capsule  Commonly known as:  KEFLEX  Take 1 capsule (500 mg total) by mouth 3 (three) times daily.     glycopyrrolate 1 MG tablet  Commonly known as:  ROBINUL  Take 2 mg by mouth 3 (three) times daily.     pantoprazole 40 MG tablet  Commonly known as:  PROTONIX  Take 1 tablet (40 mg total) by mouth daily.     prochlorperazine 10 MG tablet  Commonly known as:  COMPAZINE  Take 1 tablet (10 mg total) by mouth every 6 (six) hours as needed for nausea or vomiting.       Follow-up Information   Follow up with Kathreen Cosier, MD. (as scheduled )    Specialty:  Obstetrics and Gynecology   Contact information:   [address] [place] [address] [phone]        Tawnya Crook 09/23/2013, 6:40 PM

## 2013-09-24 LAB — URINE CULTURE: Colony Count: 40000

## 2013-09-27 ENCOUNTER — Encounter (HOSPITAL_COMMUNITY): Payer: Self-pay | Admitting: *Deleted

## 2013-09-27 ENCOUNTER — Inpatient Hospital Stay (HOSPITAL_COMMUNITY)
Admission: AD | Admit: 2013-09-27 | Discharge: 2013-09-27 | Disposition: A | Discharge status: Home / Self Care | Payer: Medicaid Other | Source: Ambulatory Visit | Attending: Obstetrics | Admitting: Obstetrics

## 2013-09-27 DIAGNOSIS — R109 Unspecified abdominal pain: Secondary | ICD-10-CM | POA: Insufficient documentation

## 2013-09-27 DIAGNOSIS — O9989 Other specified diseases and conditions complicating pregnancy, childbirth and the puerperium: Secondary | ICD-10-CM | POA: Insufficient documentation

## 2013-09-27 DIAGNOSIS — K219 Gastro-esophageal reflux disease without esophagitis: Secondary | ICD-10-CM | POA: Insufficient documentation

## 2013-09-27 DIAGNOSIS — O212 Late vomiting of pregnancy: Secondary | ICD-10-CM | POA: Insufficient documentation

## 2013-09-27 DIAGNOSIS — O99891 Other specified diseases and conditions complicating pregnancy: Secondary | ICD-10-CM | POA: Insufficient documentation

## 2013-09-27 DIAGNOSIS — K117 Disturbances of salivary secretion: Secondary | ICD-10-CM | POA: Insufficient documentation

## 2013-09-27 DIAGNOSIS — O219 Vomiting of pregnancy, unspecified: Secondary | ICD-10-CM

## 2013-09-27 LAB — URINALYSIS, ROUTINE W REFLEX MICROSCOPIC
Hgb urine dipstick: NEGATIVE
Nitrite: NEGATIVE
Protein, ur: NEGATIVE mg/dL
Specific Gravity, Urine: 1.01 (ref 1.005–1.030)
Urobilinogen, UA: 0.2 mg/dL (ref 0.0–1.0)

## 2013-09-27 LAB — COMPREHENSIVE METABOLIC PANEL
AST: 20 U/L (ref 0–37)
Albumin: 2.8 g/dL — ABNORMAL LOW (ref 3.5–5.2)
CO2: 22 mEq/L (ref 19–32)
Calcium: 8.7 mg/dL (ref 8.4–10.5)
Chloride: 97 mEq/L (ref 96–112)
Creatinine, Ser: 0.42 mg/dL — ABNORMAL LOW (ref 0.50–1.10)
GFR calc non Af Amer: >90 mL/min (ref >90)
Total Bilirubin: 0.3 mg/dL (ref 0.3–1.2)
Total Protein: 6.4 g/dL (ref 6.0–8.3)

## 2013-09-27 LAB — CBC
MCHC: 34.5 g/dL (ref 30.0–36.0)
MCV: 81.9 fL (ref 78.0–100.0)
Platelets: 315 10*3/uL (ref 150–400)
RBC: 3.43 MIL/uL — ABNORMAL LOW (ref 3.87–5.11)
RDW: 14.1 % (ref 11.5–15.5)
WBC: 6.3 10*3/uL (ref 4.0–10.5)

## 2013-09-27 LAB — URINE MICROSCOPIC-ADD ON

## 2013-09-27 MED ORDER — PROMETHAZINE HCL 25 MG/ML IJ SOLN
25.0000 mg | Freq: Once | INTRAVENOUS | Status: AC
  Administered 2013-09-27: 25 mg via INTRAVENOUS
  Filled 2013-09-27: qty 1

## 2013-09-27 MED ORDER — FAMOTIDINE IN NACL 20-0.9 MG/50ML-% IV SOLN
20.0000 mg | Freq: Once | INTRAVENOUS | Status: AC
  Administered 2013-09-27: 20 mg via INTRAVENOUS
  Filled 2013-09-27: qty 50

## 2013-09-27 MED ORDER — PANTOPRAZOLE SODIUM 40 MG PO TBEC: 40.0000 mg | DELAYED_RELEASE_TABLET | Freq: Every day | ORAL | Status: DC

## 2013-09-27 MED ORDER — PROMETHAZINE HCL 12.5 MG PO TABS: 12.5000 mg | ORAL_TABLET | Freq: Four times a day (QID) | ORAL | Status: DC | PRN

## 2013-09-27 MED ORDER — GLYCOPYRROLATE 1 MG PO TABS: 2.0000 mg | ORAL_TABLET | Freq: Three times a day (TID) | ORAL | Status: DC

## 2013-09-27 NOTE — MAU Provider Note (Signed)
History     CSN: [id number]  Arrival date and time: 09/27/13 4098   First Provider Initiated Contact with Patient 09/27/13 1922      Chief Complaint  Patient presents with  . Emesis   HPI [name] is a 22 y.o. G3P0020 at [redacted]w[redacted]d who presents to MAU today with nausea and vomiting. The patient has had this throughout the pregnancy. She states that she took Robinul and a Phenergan suppository today without relief. She was able to eat earlier, but has had multiple episodes of emesis today. She also endorses some right sided mid-abdominal pain that she states is worse after episodes of emesis. She denies fever, headache, vaginal bleeding, abnormal discharge or LOF. She reports good fetal movement.   OB History   Grav Para Term Preterm Abortions TAB SAB Ect Mult Living   3    2  2    0      Past Medical History  Diagnosis Date  . Asthma   . BV (bacterial vaginosis)   . Gonorrhea   . Chlamydia   . ADHD (attention deficit hyperactivity disorder)     since childhood    Past Surgical History  Procedure Laterality Date  . No past surgeries      Family History  Problem Relation Age of Onset  . Cervical cancer Paternal Aunt   . Hypertension Father   . Congestive Heart Failure Maternal Grandmother   . Hypertension Mother     History  Substance Use Topics  . Smoking status: Never Smoker   . Smokeless tobacco: Never Used  . Alcohol Use: No    Allergies:  Allergies  Allergen Reactions  . Shellfish Allergy Anaphylaxis  . Adhesive [Tape] Swelling and Other (See Comments)    Causes bruising.  . Betadine [Povidone Iodine] Rash    Prescriptions prior to admission  Medication Sig Dispense Refill  . acetaminophen (TYLENOL) 500 MG tablet Take 500 mg by mouth every 6 (six) hours as needed for mild pain or moderate pain.       Marland Kitchen glycopyrrolate (ROBINUL) 1 MG tablet Take 2 mg by mouth 3 (three) times daily.      . pantoprazole (PROTONIX) 40 MG tablet Take 1 tablet (40  mg total) by mouth daily.  30 tablet  0  . promethazine (PHENERGAN) 12.5 MG tablet Take 1 tablet (12.5 mg total) by mouth every 6 (six) hours as needed for nausea or vomiting.  30 tablet  0  . cephALEXin (KEFLEX) 500 MG capsule Take 1 capsule (500 mg total) by mouth 3 (three) times daily.  21 capsule  0    Review of Systems  Constitutional: Negative for fever and malaise/fatigue.  Gastrointestinal: Positive for nausea, vomiting, abdominal pain and constipation. Negative for diarrhea.  Genitourinary: Negative for dysuria and urgency.       Neg - vaginal bleeding, abnormal discharge, LOF  Neurological: Positive for weakness. Negative for dizziness and loss of consciousness.   Physical Exam   Blood pressure 112/62, pulse 83, temperature 98.2 F (36.8 C), temperature source Oral, resp. rate 20, height 5\' 6"  (1.676 m), weight 184 lb (83.462 kg), last menstrual period 04/16/2013, SpO2 100.00%.  Physical Exam  Constitutional: She is oriented to person, place, and time. She appears well-developed and well-nourished.  HENT:  Head: Normocephalic and atraumatic.  Cardiovascular: Normal rate, regular rhythm and normal heart sounds.   Respiratory: Effort normal and breath sounds normal. No respiratory distress.  GI: Soft. Bowel sounds are normal. She exhibits  no distension and no mass. There is no tenderness. There is no rebound and no guarding.  Neurological: She is alert and oriented to person, place, and time.  Skin: Skin is warm and dry. No erythema.  Psychiatric: She has a normal mood and affect.   Results for orders placed during the hospital encounter of 09/27/13 (from the past 24 hour(s))  CBC     Status: Abnormal   Collection Time    09/27/13  8:15 PM      Result Value Range   WBC 6.3  4.0 - 10.5 K/uL   RBC 3.43 (*) 3.87 - 5.11 MIL/uL   Hemoglobin 9.7 (*) 12.0 - 15.0 g/dL   HCT 16.1 (*) 09.6 - 04.5 %   MCV 81.9  78.0 - 100.0 fL   MCH 28.3  26.0 - 34.0 pg   MCHC 34.5  30.0 - 36.0  g/dL   RDW 40.9  81.1 - 91.4 %   Platelets 315  150 - 400 K/uL    MAU Course  Procedures None  MDM Patient unable to void upon arrival in MAU 1 liter IV D5LR with Phenergan infusion ordered CBC and CMP today Patient states that she was given another medication for "spitting" last time Epic records show patient was given Pepcid 20 mg Pepcid ordered 2030 - Patient is getting IV fluids. Labs pending. Care turned over to [place], CNM   Freddi Starr, PA-C  09/27/2013, 8:21 PM  Assessment and Plan   Dorathy Kinsman, CNM assumed care of patient at 8:30 PM. Labs pending. Fluids and medication infusing.  Feeling much better tolerating solids.  ASSESSMENT: Nausea vomiting of pregnancy Ptyalism Reflux  PLAN: Discharge in stable condition. Advance diet slowly. Medication refills given.  Follow-up Information   Follow up with MARSHALL,BERNARD A, MD. (as scheduled or as needed if symptoms worsen)    Specialty:  Obstetrics and Gynecology   Contact information:   [address] [place] [address] [phone]       Follow up with [facility] [place] [facility] AD[facility]. (As needed in emergencies)    Contact information:   [address] [id number] [place] [address] [phone]       Medication List         acetaminophen 500 MG tablet  Commonly known as:  TYLENOL  Take 500 mg by mouth every 6 (six) hours as needed for mild pain or moderate pain.     cephALEXin 500 MG capsule  Commonly known as:  KEFLEX  Take 1 capsule (500 mg total) by mouth 3 (three) times daily.     glycopyrrolate 1 MG tablet  Commonly known as:  ROBINUL  Take 2 tablets (2 mg total) by mouth 3 (three) times daily.     pantoprazole 40 MG tablet  Commonly known as:  PROTONIX  Take 1 tablet (40 mg total) by mouth daily.     promethazine 12.5 MG tablet  Commonly known as:  PHENERGAN  Take 1 tablet (12.5 mg total) by mouth every 6 (six)  hours as needed for nausea or vomiting.        [place], [place] 09/27/2013 21:51 PM

## 2013-09-27 NOTE — MAU Note (Signed)
Patient presents to MAu with c/o vomiting and spitting for 2 days. Denies LOF, VB, contractions. Reports good fetal movement.

## 2013-09-28 LAB — URINE CULTURE: Colony Count: >=100000

## 2013-10-04 NOTE — L&D Delivery Note (Signed)
Delivery Note At 5:10 PM a viable female was delivered via Vaginal, Spontaneous Delivery (Presentation: Right Occiput Anterior).  APGAR: 9, 9; weight .   Placenta status: , .  Cord: 3 vessels with the following complications: None.  Cord pH: not done  Anesthesia: Epidural  Episiotomy: None Lacerations: Sulcus Suture Repair: 2.0 vicryl Est. Blood Loss (mL):   Mom to postpartum.  Baby to Couplet care / Skin to Skin.  Deanna Myers 01/16/2014, 5:23 PM

## 2013-11-11 ENCOUNTER — Encounter (HOSPITAL_COMMUNITY): Payer: Self-pay

## 2013-11-11 ENCOUNTER — Inpatient Hospital Stay (HOSPITAL_COMMUNITY)
Admission: AD | Admit: 2013-11-11 | Discharge: 2013-11-11 | Disposition: A | Discharge status: Home / Self Care | Payer: Medicaid Other | Source: Ambulatory Visit | Attending: Obstetrics | Admitting: Obstetrics

## 2013-11-11 DIAGNOSIS — R5383 Other fatigue: Secondary | ICD-10-CM

## 2013-11-11 DIAGNOSIS — O210 Mild hyperemesis gravidarum: Secondary | ICD-10-CM

## 2013-11-11 DIAGNOSIS — R42 Dizziness and giddiness: Secondary | ICD-10-CM | POA: Insufficient documentation

## 2013-11-11 DIAGNOSIS — R5381 Other malaise: Secondary | ICD-10-CM | POA: Insufficient documentation

## 2013-11-11 DIAGNOSIS — O212 Late vomiting of pregnancy: Secondary | ICD-10-CM | POA: Insufficient documentation

## 2013-11-11 DIAGNOSIS — R109 Unspecified abdominal pain: Secondary | ICD-10-CM | POA: Insufficient documentation

## 2013-11-11 LAB — COMPREHENSIVE METABOLIC PANEL
ALK PHOS: 72 U/L (ref 39–117)
ALT: 6 U/L (ref 0–35)
AST: 14 U/L (ref 0–37)
Albumin: 2.8 g/dL — ABNORMAL LOW (ref 3.5–5.2)
BUN: 6 mg/dL (ref 6–23)
CALCIUM: 9.2 mg/dL (ref 8.4–10.5)
CO2: 23 meq/L (ref 19–32)
Chloride: 102 mEq/L (ref 96–112)
Creatinine, Ser: 0.38 mg/dL — ABNORMAL LOW (ref 0.50–1.10)
GFR calc non Af Amer: >90 mL/min (ref >90)
GLUCOSE: 75 mg/dL (ref 70–99)
Potassium: 4.3 mEq/L (ref 3.7–5.3)
Sodium: 138 mEq/L (ref 137–147)
Total Bilirubin: 0.2 mg/dL — ABNORMAL LOW (ref 0.3–1.2)
Total Protein: 6.6 g/dL (ref 6.0–8.3)

## 2013-11-11 LAB — CBC
HCT: 30.4 % — ABNORMAL LOW (ref 36.0–46.0)
HEMOGLOBIN: 10.4 g/dL — AB (ref 12.0–15.0)
MCH: 28.7 pg (ref 26.0–34.0)
MCHC: 34.2 g/dL (ref 30.0–36.0)
MCV: 83.7 fL (ref 78.0–100.0)
Platelets: 307 10*3/uL (ref 150–400)
RBC: 3.63 MIL/uL — AB (ref 3.87–5.11)
RDW: 13.5 % (ref 11.5–15.5)
WBC: 8 10*3/uL (ref 4.0–10.5)

## 2013-11-11 MED ORDER — ONDANSETRON HCL 4 MG/2ML IJ SOLN
4.0000 mg | Freq: Once | INTRAMUSCULAR | Status: AC
  Administered 2013-11-11: 4 mg via INTRAVENOUS
  Filled 2013-11-11: qty 2

## 2013-11-11 MED ORDER — PROMETHAZINE HCL 25 MG/ML IJ SOLN
25.0000 mg | Freq: Once | INTRAVENOUS | Status: AC
  Administered 2013-11-11: 25 mg via INTRAVENOUS
  Filled 2013-11-11: qty 1

## 2013-11-11 MED ORDER — PANTOPRAZOLE SODIUM 40 MG PO TBEC
40.0000 mg | DELAYED_RELEASE_TABLET | Freq: Every day | ORAL | Status: DC
Start: 2013-11-11 — End: 2013-12-21

## 2013-11-11 MED ORDER — GLYCOPYRROLATE 0.2 MG/ML IJ SOLN
0.1000 mg | Freq: Once | INTRAMUSCULAR | Status: AC
  Administered 2013-11-11: 0.1 mg via INTRAVENOUS
  Filled 2013-11-11: qty 0.5

## 2013-11-11 MED ORDER — METOCLOPRAMIDE HCL 5 MG/ML IJ SOLN
10.0000 mg | Freq: Once | INTRAMUSCULAR | Status: AC
  Administered 2013-11-11: 10 mg via INTRAVENOUS
  Filled 2013-11-11: qty 2

## 2013-11-11 MED ORDER — LACTATED RINGERS IV BOLUS (SEPSIS)
1000.0000 mL | Freq: Once | INTRAVENOUS | Status: AC
  Administered 2013-11-11: 1000 mL via INTRAVENOUS

## 2013-11-11 MED ORDER — FAMOTIDINE IN NACL 20-0.9 MG/50ML-% IV SOLN
20.0000 mg | Freq: Once | INTRAVENOUS | Status: AC
  Administered 2013-11-11: 20 mg via INTRAVENOUS
  Filled 2013-11-11: qty 50

## 2013-11-11 MED ORDER — ONDANSETRON HCL 4 MG PO TABS: 4.0000 mg | ORAL_TABLET | Freq: Four times a day (QID) | ORAL | Status: DC

## 2013-11-11 MED ORDER — PROMETHAZINE HCL 12.5 MG PO TABS: 12.5000 mg | ORAL_TABLET | Freq: Four times a day (QID) | ORAL | Status: DC | PRN

## 2013-11-11 NOTE — Discharge Instructions (Signed)
Hyperemesis Gravidarum Diet °Hyperemesis gravidarum is a severe form of morning sickness. It is characterized by frequent and severe vomiting. It happens during the first trimester of pregnancy. It may be caused by the rapid hormone changes that happen during pregnancy. It is associated with a 5% weight loss of pre-pregnancy weight. The hyperemesis diet may be used to lessen symptoms of nausea and vomiting. °EATING GUIDELINES °· Eat 5 to 6 small meals daily instead of 3 large meals. °· Avoid foods with strong smells. °· Avoid drinking 30 minutes before and after meals. °· Avoid fried or high-fat foods, such as butter and cream sauces. °· Starchy foods are usually well-tolerated, such as cereal, toast, bread, potatoes, pasta, rice, and pretzels. °· Eat crackers before you get out of bed in the morning. °· Avoid spicy foods. °· Ginger may help with nausea. Add ¼ tsp ginger to hot tea or choose ginger tea. °· Continue to take your prenatal vitamins as directed by your caregiver. °SAMPLE MEAL PLAN °Breakfast  °· ½ cup oatmeal °· 1 slice toast °· 1 tsp heart-healthy margarine °· 1 tsp jelly °· 1 scrambled egg °Midmorning Snack  °· 1 cup low-fat yogurt °Lunch  °· Plain ham sandwich °· Carrot or celery sticks °· 1 small apple °· 3 graham crackers °Midafternoon Snack  °· Cheese and crackers °Dinner °· 4 oz pork tenderloin °· 1 small baked potato °· 1 tsp margarine °· ½ cup broccoli °· ½ cup grapes °Evening Snack °· 1 cup pudding °Document Released: 07/18/2007 Document Revised: 12/13/2011 Document Reviewed: 02/20/2013 °[employer]® Patient Information ©2014 [employer]. ° °

## 2013-11-11 NOTE — MAU Provider Note (Signed)
History     CSN: [id number]  Arrival date and time: 11/11/13 0710   First Provider Initiated Contact with Patient 11/11/13 [phone]      Chief Complaint  Patient presents with  . Emesis During Pregnancy  . Abdominal Pain   HPI Deanna Myers is a 23 y.o. G3P0020 at 3379w6d who presents to MAU today with complaint of worsening of N/V. The patient has had hyperemesis throughout the pregnancy. She is taking Phenergan PO and suppository without relief. She states that the N/V has been worse since yesterday. She is also having occasional loose stools, weakness, dizziness and fatigue. She denies contractions, vaginal discharge or bleeding. She reports good fetal movement.   OB History   Grav Para Term Preterm Abortions TAB SAB Ect Mult Living   3    2  2    0      Past Medical History  Diagnosis Date  . Asthma   . BV (bacterial vaginosis)   . Gonorrhea   . Chlamydia   . ADHD (attention deficit hyperactivity disorder)     since childhood    Past Surgical History  Procedure Laterality Date  . No past surgeries      Family History  Problem Relation Age of Onset  . Cervical cancer Paternal Aunt   . Hypertension Father   . Congestive Heart Failure Maternal Grandmother   . Hypertension Mother     History  Substance Use Topics  . Smoking status: Never Smoker   . Smokeless tobacco: Never Used  . Alcohol Use: No    Allergies:  Allergies  Allergen Reactions  . Shellfish Allergy Anaphylaxis  . Adhesive [Tape] Swelling and Other (See Comments)    Causes bruising.  . Betadine [Povidone Iodine] Rash    Prescriptions prior to admission  Medication Sig Dispense Refill  . acetaminophen (TYLENOL) 500 MG tablet Take 500 mg by mouth every 6 (six) hours as needed for mild pain or moderate pain.       . pantoprazole (PROTONIX) 40 MG tablet Take 1 tablet (40 mg total) by mouth daily.  30 tablet  1  . promethazine (PHENERGAN) 12.5 MG tablet Take 1 tablet (12.5 mg total) by mouth  every 6 (six) hours as needed for nausea or vomiting.  30 tablet  1  . terconazole (TERAZOL 3) 0.8 % vaginal cream Place 1 applicator vaginally at bedtime.        Review of Systems  Constitutional: Positive for malaise/fatigue. Negative for fever.  Gastrointestinal: Positive for nausea, vomiting, abdominal pain and diarrhea. Negative for constipation.  Genitourinary: Negative for dysuria, urgency and frequency.       Neg - vaginal bleeding, discharge  Neurological: Positive for dizziness and weakness. Negative for loss of consciousness.   Physical Exam   Blood pressure 114/71, pulse 102, temperature 98.2 F (36.8 C), temperature source Oral, resp. rate 18, last menstrual period 04/16/2013.  Physical Exam  Constitutional: She is oriented to person, place, and time. She appears well-developed and well-nourished. No distress.  HENT:  Head: Normocephalic and atraumatic.  Cardiovascular: Normal rate, regular rhythm and normal heart sounds.   Respiratory: Effort normal and breath sounds normal. No respiratory distress.  GI: Soft. She exhibits no distension and no mass. There is tenderness (mild tenderness to palpation of the upper abdomen). There is no rebound and no guarding.  Neurological: She is alert and oriented to person, place, and time.  Skin: Skin is warm and dry. No erythema.  Psychiatric: She has  a normal mood and affect.   No results found for this or any previous visit (from the past 24 hour(s)).  Fetal Monitoring: Baseline: 140 bpm, moderate variability, + accelerations, no decelerations Contractions: none  MAU Course  Procedures None  MDM Patient unable to give urine sample upon arrival Will start 1 L IV D5LR with Phenergan infusion, 0.1 mg Robinul and 20 mg Pepcid IV Patient still reports some nausea 1 L LR given with 4 mg Zofran Patient has not vomited since IV was started. She requests a drink, but tells RN that she is still nauseous.  IV Reglan ordered Patient  reports improvement in symptoms. Denies nausea at this time. No additional episodes of emesis Assessment and Plan  A: Hyperemesis gravidarum  P: Discharge home Information on hyperemesis diet given Rx for Zofran, Phenergan and Protonix sent to patient's pharmacy. Patient requested refills on Phenergan and Protonix today Patient advised to follow-up with Dr. Gaynell Face for routine prenatal care as scheduled Patient advised to continue anti-emetics as previously prescribed Patient may return to MAU as needed or if her condition were to change or worsen  Freddi Starr, PA-C  11/11/2013, 8:10 AM

## 2013-11-11 NOTE — MAU Note (Signed)
Pt states here for hyperemesis. Vomiting yellow bile in room. Has had tightening in her abdomen however none at present. Has taken phenergan at home and robinul for spitting.

## 2013-12-04 ENCOUNTER — Other Ambulatory Visit (HOSPITAL_COMMUNITY): Payer: Self-pay | Admitting: Obstetrics

## 2013-12-04 DIAGNOSIS — Z3689 Encounter for other specified antenatal screening: Secondary | ICD-10-CM

## 2013-12-04 DIAGNOSIS — O2690 Pregnancy related conditions, unspecified, unspecified trimester: Secondary | ICD-10-CM

## 2013-12-07 ENCOUNTER — Encounter (HOSPITAL_COMMUNITY): Payer: Self-pay | Admitting: *Deleted

## 2013-12-07 ENCOUNTER — Inpatient Hospital Stay (HOSPITAL_COMMUNITY)
Admission: AD | Admit: 2013-12-07 | Discharge: 2013-12-07 | Disposition: A | Discharge status: Home / Self Care | Payer: BC Managed Care – PPO | Source: Ambulatory Visit | Attending: Obstetrics | Admitting: Obstetrics

## 2013-12-07 DIAGNOSIS — O99891 Other specified diseases and conditions complicating pregnancy: Secondary | ICD-10-CM

## 2013-12-07 DIAGNOSIS — O219 Vomiting of pregnancy, unspecified: Secondary | ICD-10-CM

## 2013-12-07 DIAGNOSIS — O9989 Other specified diseases and conditions complicating pregnancy, childbirth and the puerperium: Secondary | ICD-10-CM

## 2013-12-07 DIAGNOSIS — O212 Late vomiting of pregnancy: Secondary | ICD-10-CM | POA: Insufficient documentation

## 2013-12-07 DIAGNOSIS — R1084 Generalized abdominal pain: Secondary | ICD-10-CM

## 2013-12-07 DIAGNOSIS — R109 Unspecified abdominal pain: Secondary | ICD-10-CM | POA: Insufficient documentation

## 2013-12-07 DIAGNOSIS — M549 Dorsalgia, unspecified: Secondary | ICD-10-CM | POA: Insufficient documentation

## 2013-12-07 MED ORDER — PANTOPRAZOLE SODIUM 40 MG IV SOLR
40.0000 mg | Freq: Once | INTRAVENOUS | Status: AC
  Administered 2013-12-07: 40 mg via INTRAVENOUS
  Filled 2013-12-07: qty 40

## 2013-12-07 MED ORDER — ONDANSETRON 8 MG/NS 50 ML IVPB
8.0000 mg | Freq: Once | INTRAVENOUS | Status: AC
  Administered 2013-12-07: 8 mg via INTRAVENOUS
  Filled 2013-12-07: qty 8

## 2013-12-07 MED ORDER — METOCLOPRAMIDE HCL 5 MG/ML IJ SOLN
10.0000 mg | Freq: Once | INTRAMUSCULAR | Status: DC
Start: 2013-12-07 — End: 2013-12-07

## 2013-12-07 MED ORDER — PROMETHAZINE HCL 25 MG RE SUPP: 25.0000 mg | Freq: Four times a day (QID) | RECTAL | Status: DC | PRN

## 2013-12-07 MED ORDER — LACTATED RINGERS IV BOLUS (SEPSIS)
1000.0000 mL | Freq: Once | INTRAVENOUS | Status: AC
  Administered 2013-12-07: 1000 mL via INTRAVENOUS

## 2013-12-07 MED ORDER — GLYCOPYRROLATE 1 MG PO TABS
1.0000 mg | ORAL_TABLET | Freq: Once | ORAL | Status: AC
  Administered 2013-12-07: 1 mg via ORAL
  Filled 2013-12-07: qty 1

## 2013-12-07 MED ORDER — RANITIDINE HCL 150 MG PO TABS
150.0000 mg | ORAL_TABLET | Freq: Two times a day (BID) | ORAL | Status: DC
Start: 2013-12-07 — End: 2013-12-21

## 2013-12-07 NOTE — Discharge Instructions (Signed)
Hyperemesis Gravidarum  Hyperemesis gravidarum is a severe form of nausea and vomiting that happens during pregnancy. Hyperemesis is worse than morning sickness. It may cause you to have nausea or vomiting all day for many days. It may keep you from eating and drinking enough food and liquids. Hyperemesis usually occurs during the first half (the first 20 weeks) of pregnancy. It often goes away once a woman is in her second half of pregnancy. However, sometimes hyperemesis continues through an entire pregnancy.   CAUSES   The cause of this condition is not completely known but is thought to be related to changes in the body's hormones when pregnant. It could be from the high level of the pregnancy hormone or an increase in estrogen in the body.   SIGNS AND SYMPTOMS   · Severe nausea and vomiting.  · Nausea that does not go away.  · Vomiting that does not allow you to keep any food down.  · Weight loss and body fluid loss (dehydration).  · Having no desire to eat or not liking food you have previously enjoyed.  DIAGNOSIS   Your health care provider will do a physical exam and ask you about your symptoms. He or she may also order blood tests and urine tests to make sure something else is not causing the problem.   TREATMENT   You may only need medicine to control the problem. If medicines do not control the nausea and vomiting, you will be treated in the hospital to prevent dehydration, increased acid in the blood (acidosis), weight loss, and changes in the electrolytes in your body that may harm the unborn baby (fetus). You may need IV fluids.   HOME CARE INSTRUCTIONS   · Only take over-the-counter or prescription medicines as directed by your health care provider.  · Try eating a couple of dry crackers or toast in the morning before getting out of bed.  · Avoid foods and smells that upset your stomach.  · Avoid fatty and spicy foods.  · Eat 5 6 small meals a day.  · Do not drink when eating meals. Drink between  meals.  · For snacks, eat high-protein foods, such as cheese.  · Eat or suck on things that have ginger in them. Ginger helps nausea.  · Avoid food preparation. The smell of food can spoil your appetite.  · Avoid iron pills and iron in your multivitamins until after 3 4 months of being pregnant. However, consult with your health care provider before stopping any prescribed iron pills.  SEEK MEDICAL CARE IF:   · Your abdominal pain increases.  · You have a severe headache.  · You have vision problems.  · You are losing weight.  SEEK IMMEDIATE MEDICAL CARE IF:   · You are unable to keep fluids down.  · You vomit blood.  · You have constant nausea and vomiting.  · You have excessive weakness.  · You have extreme thirst.  · You have dizziness or fainting.  · You have a fever or persistent symptoms for more than 2 3 days.  · You have a fever and your symptoms suddenly get worse.  MAKE SURE YOU:   · Understand these instructions.  · Will watch your condition.  · Will get help right away if you are not doing well or get worse.  Document Released: 09/20/2005 Document Revised: 07/11/2013 Document Reviewed: 05/02/2013  Exit[employer]® Patient Information ©2014 [employer].

## 2013-12-07 NOTE — MAU Provider Note (Signed)
  History     CSN: [id number]  Arrival date and time: 12/07/13 [id number]   First Provider Initiated Contact with Patient 12/07/13 2015      Chief Complaint  Patient presents with  . Emesis During Pregnancy  . Abdominal Pain   HPI  Deanna Myers is a 23 y.o. G3P0020 at 7926w4d who presents today with nausea and vomiting. She states that she has had nausea and vomiting for her entire pregnancy and has taken "everything". She states that she has not taken any meds in the last two days because she cannot keep the meds down. She states that the baby has been moving normally. She denies any UCs, but has been having back pain. She has an ultrasound on Monday for fetal growth 2/2 no weight gain for the pregnancy. She denies any bleeding or LOF.   Past Medical History  Diagnosis Date  . Asthma   . BV (bacterial vaginosis)   . Gonorrhea   . Chlamydia   . ADHD (attention deficit hyperactivity disorder)     since childhood    Past Surgical History  Procedure Laterality Date  . No past surgeries      Family History  Problem Relation Age of Onset  . Cervical cancer Paternal Aunt   . Hypertension Father   . Congestive Heart Failure Maternal Grandmother   . Hypertension Mother     History  Substance Use Topics  . Smoking status: Never Smoker   . Smokeless tobacco: Never Used  . Alcohol Use: No    Allergies:  Allergies  Allergen Reactions  . Shellfish Allergy Anaphylaxis  . Adhesive [Tape] Swelling and Other (See Comments)    Causes bruising.  . Betadine [Povidone Iodine] Rash    Prescriptions prior to admission  Medication Sig Dispense Refill  . acetaminophen (TYLENOL) 500 MG tablet Take 500 mg by mouth every 6 (six) hours as needed for mild pain, moderate pain or headache.       . pantoprazole (PROTONIX) 40 MG tablet Take 1 tablet (40 mg total) by mouth daily.  30 tablet  1  . promethazine (PHENERGAN) 12.5 MG tablet Take 1 tablet (12.5 mg total) by mouth every 6 (six) hours  as needed for nausea or vomiting.  30 tablet  1    ROS Physical Exam   Blood pressure 102/74, pulse 104, temperature 98.2 F (36.8 C), resp. rate 20, last menstrual period 04/16/2013.  Physical Exam  Nursing note and vitals reviewed. Constitutional: She is oriented to person, place, and time. She appears well-developed and well-nourished. No distress.  Cardiovascular: Normal rate.   Respiratory: Effort normal.  GI: Soft. There is no tenderness.  Neurological: She is alert and oriented to person, place, and time.  Skin: Skin is warm and dry.  Psychiatric: She has a normal mood and affect.   FHT: 140, moderate with 15x15 accels, no decels Toco: no UCs  MAU Course  Procedures   2202: Patient is feeling better at this time. She has been tolerating PO foods and fluids without any emesis since arrival.  Assessment and Plan   1. Nausea/vomiting in pregnancy    PTL precautions RX: phenergan suppositories  Follow-up Information   Follow up with Kathreen CosierMARSHALL,BERNARD A, MD. (As scheduled)    Specialty:  Obstetrics and Gynecology   Contact information:   [address] [place] [id number] [phone]        Tawnya CrookHogan, Heather Donovan 12/07/2013, 8:16 PM

## 2013-12-07 NOTE — MAU Note (Addendum)
I've been throwing up the entire pregnancy. Having abd cramps since yesterday and sharp pains and some spasms in my butt.Chest and throat burning

## 2013-12-10 ENCOUNTER — Ambulatory Visit (HOSPITAL_COMMUNITY)
Admission: RE | Admit: 2013-12-10 | Discharge: 2013-12-10 | Disposition: A | Discharge status: Home / Self Care | Payer: BC Managed Care – PPO | Source: Ambulatory Visit | Attending: Obstetrics | Admitting: Obstetrics

## 2013-12-10 ENCOUNTER — Encounter (HOSPITAL_COMMUNITY): Payer: Self-pay

## 2013-12-10 VITALS — BP 108/65 | HR 98 | Wt 193.5 lb

## 2013-12-10 DIAGNOSIS — O358XX0 Maternal care for other (suspected) fetal abnormality and damage, not applicable or unspecified: Secondary | ICD-10-CM | POA: Insufficient documentation

## 2013-12-10 DIAGNOSIS — Z363 Encounter for antenatal screening for malformations: Secondary | ICD-10-CM | POA: Insufficient documentation

## 2013-12-10 DIAGNOSIS — Z3689 Encounter for other specified antenatal screening: Secondary | ICD-10-CM

## 2013-12-10 DIAGNOSIS — Z1389 Encounter for screening for other disorder: Secondary | ICD-10-CM | POA: Insufficient documentation

## 2013-12-10 DIAGNOSIS — O210 Mild hyperemesis gravidarum: Secondary | ICD-10-CM | POA: Insufficient documentation

## 2013-12-10 DIAGNOSIS — O2690 Pregnancy related conditions, unspecified, unspecified trimester: Secondary | ICD-10-CM

## 2013-12-10 NOTE — Progress Notes (Signed)
[facility] ultrasound  Indication: 23 yr old G3P0020 at 183w0d for fetal ultrasound secondary to poor maternal weight gain.  Findings: 1. Single intrauterine pregnancy. 2. Estimated fetal weight is in the 39th%. The long bones measure approximately 3 weeks behind dating. 3. Anterior placenta without evidence of previa. 4. Normal amniotic fluid volume. 5. The views of the spine, heart, cord insertion, hands, and ankles are limited. 6. The remainder of the limited anatomy survey is normal. 7. The long bones are normal in shape and echogenicity.  Recommendations: 1. Appropriate fetal growth. 2. Normal limited anatomy survey: - anatomy limited by advanced gestational age - will reattempt to complete anatomy on follow up ultrasound; however may not be able to complete given advanced gestational age 553. Lagging long bone measurements: - discussed this may be constitutional; most likely given lagging by only 3 weeks - may be related to aneuploidy (do not see any aneuploidy screening in patient's chart) - discussed may be due to a skeletal dysplasia- discussed less likely given bones are only lagging by 3 weeks; and very unlikely to be a lethal dysplasia given only lagging by 3 weeks - discussed bones are normal in appearance also making a skeletal dysplasia less likely - recommend follow up ultrasound for fetal growth in 3 weeks   Eulis FosterKristen Marylan Glore, MD

## 2013-12-14 ENCOUNTER — Inpatient Hospital Stay (HOSPITAL_COMMUNITY)
Admission: AD | Admit: 2013-12-14 | Discharge: 2013-12-14 | Disposition: A | Discharge status: Home / Self Care | Payer: BC Managed Care – PPO | Source: Ambulatory Visit | Attending: Obstetrics | Admitting: Obstetrics

## 2013-12-14 ENCOUNTER — Encounter (HOSPITAL_COMMUNITY): Payer: Self-pay

## 2013-12-14 DIAGNOSIS — K5900 Constipation, unspecified: Secondary | ICD-10-CM | POA: Insufficient documentation

## 2013-12-14 DIAGNOSIS — Z3493 Encounter for supervision of normal pregnancy, unspecified, third trimester: Secondary | ICD-10-CM

## 2013-12-14 DIAGNOSIS — O99613 Diseases of the digestive system complicating pregnancy, third trimester: Secondary | ICD-10-CM

## 2013-12-14 DIAGNOSIS — O219 Vomiting of pregnancy, unspecified: Secondary | ICD-10-CM

## 2013-12-14 DIAGNOSIS — O212 Late vomiting of pregnancy: Secondary | ICD-10-CM | POA: Insufficient documentation

## 2013-12-14 DIAGNOSIS — K30 Functional dyspepsia: Secondary | ICD-10-CM

## 2013-12-14 DIAGNOSIS — K3189 Other diseases of stomach and duodenum: Secondary | ICD-10-CM | POA: Insufficient documentation

## 2013-12-14 DIAGNOSIS — R1013 Epigastric pain: Secondary | ICD-10-CM

## 2013-12-14 LAB — URINALYSIS, ROUTINE W REFLEX MICROSCOPIC
BILIRUBIN URINE: NEGATIVE
Glucose, UA: NEGATIVE mg/dL
Hgb urine dipstick: NEGATIVE
KETONES UR: NEGATIVE mg/dL
NITRITE: NEGATIVE
Protein, ur: 30 mg/dL — AB
SPECIFIC GRAVITY, URINE: 1.02 (ref 1.005–1.030)
Urobilinogen, UA: 0.2 mg/dL (ref 0.0–1.0)
pH: 6 (ref 5.0–8.0)

## 2013-12-14 LAB — URINE MICROSCOPIC-ADD ON

## 2013-12-14 MED ORDER — GI COCKTAIL ~~LOC~~
30.0000 mL | Freq: Once | ORAL | Status: AC
  Administered 2013-12-14: 30 mL via ORAL
  Filled 2013-12-14: qty 30

## 2013-12-14 MED ORDER — PROMETHAZINE HCL 25 MG/ML IJ SOLN
25.0000 mg | Freq: Once | INTRAMUSCULAR | Status: AC
  Administered 2013-12-14: 25 mg via INTRAMUSCULAR
  Filled 2013-12-14: qty 1

## 2013-12-14 NOTE — MAU Provider Note (Signed)
History     CSN: [id number]  Arrival date and time: 12/14/13 1640   First Provider Initiated Contact with Patient 12/14/13 1813      No chief complaint on file.  HPI Deanna Myers is 23 y.o. G3P0020 1779w4d weeks presenting with nausea and vomiting.  Hx of N/V and "spitting" with this pregnancy with multiple visits to MAU.  She is a patient of Dr. Elsie StainMarshall's.  She has vomited X 5 today.  SHe states she has talked to nutritionist and even with the diet, she has days that she vomits.  Today she ate a bite of a sub with grilled chicken and she vomited.  She has phenergan, zofran --sometimes it helps.   She has indigestion/heartburn. She denies vaginal bleeding, leaking of fluid.   Has had Braxton-Hicks contractions for weeks--no change today.  She is constipated, not having a bowel movement in several days.  "I would like to try anything".  Patient wanted IV fluids and phenergan--not indicated at this visit--explained why.   Past Medical History  Diagnosis Date  . Asthma   . BV (bacterial vaginosis)   . Gonorrhea   . Chlamydia   . ADHD (attention deficit hyperactivity disorder)     since childhood    Past Surgical History  Procedure Laterality Date  . No past surgeries      Family History  Problem Relation Age of Onset  . Cervical cancer Paternal Aunt   . Hypertension Father   . Congestive Heart Failure Maternal Grandmother   . Hypertension Mother     History  Substance Use Topics  . Smoking status: Never Smoker   . Smokeless tobacco: Never Used  . Alcohol Use: No    Allergies:  Allergies  Allergen Reactions  . Shellfish Allergy Anaphylaxis  . Adhesive [Tape] Swelling and Other (See Comments)    Causes bruising.  . Betadine [Povidone Iodine] Rash    Prescriptions prior to admission  Medication Sig Dispense Refill  . ENSURE (ENSURE) Take 1 mL by mouth 3 (three) times daily between meals.      . promethazine (PHENERGAN) 25 MG suppository Place 1 suppository (25  mg total) rectally every 6 (six) hours as needed for nausea or vomiting.  12 each  1  . ranitidine (ZANTAC) 150 MG tablet Take 1 tablet (150 mg total) by mouth 2 (two) times daily.  60 tablet  1  . acetaminophen (TYLENOL) 500 MG tablet Take 500 mg by mouth every 6 (six) hours as needed for mild pain, moderate pain or headache.       . pantoprazole (PROTONIX) 40 MG tablet Take 1 tablet (40 mg total) by mouth daily.  30 tablet  1  . promethazine (PHENERGAN) 12.5 MG tablet Take 1 tablet (12.5 mg total) by mouth every 6 (six) hours as needed for nausea or vomiting.  30 tablet  1    Review of Systems  Constitutional: Negative for fever and chills.  Gastrointestinal: Positive for nausea, vomiting, abdominal pain (Braxton-Hicks for "weeks:) and constipation.  Genitourinary: Negative for dysuria, urgency, frequency and hematuria.       Denies vaginal discharge or leaking of fluid.   Physical Exam   Blood pressure 116/78, pulse 120, temperature 98.3 F (36.8 C), temperature source Oral, resp. rate 18, height 5\' 3"  (1.6 m), weight 193 lb (87.544 kg), last menstrual period 04/16/2013.  Physical Exam  Constitutional: She is oriented to person, place, and time. She appears well-developed and well-nourished.  HENT:  Head: Normocephalic.  GI: There is no tenderness. There is no rebound and no guarding.  Genitourinary:  Not indicated  Neurological: She is alert and oriented to person, place, and time.  Skin: Skin is warm and dry.  Psychiatric: She has a normal mood and affect. Her behavior is normal.    Results for orders placed during the hospital encounter of 12/14/13 (from the past 24 hour(s))  URINALYSIS, ROUTINE W REFLEX MICROSCOPIC     Status: Abnormal   Collection Time    12/14/13  5:25 PM      Result Value Ref Range   Color, Urine YELLOW  YELLOW   APPearance CLOUDY (*) CLEAR   Specific Gravity, Urine 1.020  1.005 - 1.030   pH 6.0  5.0 - 8.0   Glucose, UA NEGATIVE  NEGATIVE mg/dL   Hgb  urine dipstick NEGATIVE  NEGATIVE   Bilirubin Urine NEGATIVE  NEGATIVE   Ketones, ur NEGATIVE  NEGATIVE mg/dL   Protein, ur 30 (*) NEGATIVE mg/dL   Urobilinogen, UA 0.2  0.0 - 1.0 mg/dL   Nitrite NEGATIVE  NEGATIVE   Leukocytes, UA SMALL (*) NEGATIVE  URINE MICROSCOPIC-ADD ON     Status: Abnormal   Collection Time    12/14/13  5:25 PM      Result Value Ref Range   Squamous Epithelial / LPF MANY (*) RARE   WBC, UA 3-6  <3 WBC/hpf   Urine-Other MUCOUS PRESENT     MAU Course  Procedures   FMS reactive.  Neg for contractions                       GI cocktail given in MAU                       Phenergan 25mg  IM given in MAU  MDM Patient is feeling much better after GI Cocktail.  Nausea persist and she wants something for the nausea   Assessment and Plan  A:  Nausea and vomiting in third trimester pregnancy==[redacted]w[redacted]d       Hx of hyperemesis and ptyalism       Indigestion       Constipation  P:  Instructed to drink small amount of fluid all day       May use stool softner for constipation       Patient Mother is coming to pick her up--sedating med given       Follow up with Dr. Nolberto Hanlon 12/14/2013, 6:21 PM

## 2013-12-14 NOTE — MAU Note (Signed)
Vomiting, heart burn. Contractions and anal pain. (had just eaten chicken philly cheesesteak- pt  Is spitting in triage).  States has hyperemesis doesn't matter what she eats, it all comes back up.

## 2013-12-14 NOTE — Discharge Instructions (Signed)
Heartburn During Pregnancy  Heartburn happens when stomach acid goes up into the esophagus. The esophagus is the tube between the mouth and the stomach. This acid causes a burning pain in the chest or throat. This happens more often in the later part of pregnancy because the womb (uterus) gets larger. It may also happen because of hormone changes. Heartburn problems often go away after giving birth. HOME CARE  Take all medicine as told by your doctor.  Raise the head of your bed with blocks only as told by your doctor.  Do not exercise right after eating.  Avoid eating 2 3 hours before bed. Do not lie down right after eating.  Eat small meals throughout the day instead of 3 large meals.  Avoid foods that give you heartburn. Foods you may want to avoid include:  Peppers.  Chocolate.  High-fat foods, including fried foods.  Spicy foods.  Garlic and onions.  Citrus fruits, including oranges, grapefruit, lemons, and limes.  Food containing tomatoes or tomato products.  Mint.  Bubbly (carbonated) drinks and drinks with caffeine.  Vinegar. GET HELP IF:  You have any belly (abdominal) pain.  You feel burning in your upper belly or chest, especially after eating or lying down.  You feel sick to your stomach (nauseous) and throw up (vomit).  Your stomach feels upset after you eat. GET HELP RIGHT AWAY IF:  You have bad chest pain that goes down your arm or into your jaw or neck.  You feel sweaty, dizzy, or lightheaded.  You have trouble breathing.  You throw up blood.  You have trouble or pain when swallowing.  You have bloody or black poop (stool).  You have heartburn more than 3 times a week, for more than 2 weeks. MAKE SURE YOU:  Understand these instructions.  Will watch your condition.  Will get help right away if you are not doing well or get worse. Document Released: 10/23/2010 Document Revised: 07/11/2013 Document Reviewed: 05/09/2013 [facility] Patient  Information 2014 [place]. Hyperemesis Gravidarum Hyperemesis gravidarum is a severe form of nausea and vomiting that happens during pregnancy. Hyperemesis is worse than morning sickness. It may cause you to have nausea or vomiting all day for many days. It may keep you from eating and drinking enough food and liquids. Hyperemesis usually occurs during the first half (the first 20 weeks) of pregnancy. It often goes away once a woman is in her second half of pregnancy. However, sometimes hyperemesis continues through an entire pregnancy.  CAUSES  The cause of this condition is not completely known but is thought to be related to changes in the body's hormones when pregnant. It could be from the high level of the pregnancy hormone or an increase in estrogen in the body.  SIGNS AND SYMPTOMS   Severe nausea and vomiting.  Nausea that does not go away.  Vomiting that does not allow you to keep any food down.  Weight loss and body fluid loss (dehydration).  Having no desire to eat or not liking food you have previously enjoyed. DIAGNOSIS  Your health care provider will do a physical exam and ask you about your symptoms. He or she may also order blood tests and urine tests to make sure something else is not causing the problem.  TREATMENT  You may only need medicine to control the problem. If medicines do not control the nausea and vomiting, you will be treated in the hospital to prevent dehydration, increased acid in the blood (acidosis),  weight loss, and changes in the electrolytes in your body that may harm the unborn baby (fetus). You may need IV fluids.  HOME CARE INSTRUCTIONS   Only take over-the-counter or prescription medicines as directed by your health care provider.  Try eating a couple of dry crackers or toast in the morning before getting out of bed.  Avoid foods and smells that upset your stomach.  Avoid fatty and spicy foods.  Eat 5 6 small meals a day.  Do not drink  when eating meals. Drink between meals.  For snacks, eat high-protein foods, such as cheese.  Eat or suck on things that have ginger in them. Ginger helps nausea.  Avoid food preparation. The smell of food can spoil your appetite.  Avoid iron pills and iron in your multivitamins until after 3 4 months of being pregnant. However, consult with your health care provider before stopping any prescribed iron pills. SEEK MEDICAL CARE IF:   Your abdominal pain increases.  You have a severe headache.  You have vision problems.  You are losing weight. SEEK IMMEDIATE MEDICAL CARE IF:   You are unable to keep fluids down.  You vomit blood.  You have constant nausea and vomiting.  You have excessive weakness.  You have extreme thirst.  You have dizziness or fainting.  You have a fever or persistent symptoms for more than 2 3 days.  You have a fever and your symptoms suddenly get worse. MAKE SURE YOU:   Understand these instructions.  Will watch your condition.  Will get help right away if you are not doing well or get worse. Document Released: 09/20/2005 Document Revised: 07/11/2013 Document Reviewed: 05/02/2013 [facility] Patient Information 2014 [place], [place]. Constipation, Adult Constipation is when a person:  Poops (bowel movement) less than 3 times a week.  Has a hard time pooping.  Has poop that is dry, hard, or bigger than normal. HOME CARE   Eat more fiber, such as fruits, vegetables, whole grains like brown rice, and beans.  Eat less fatty foods and sugar. This includes [place] fries, hamburgers, cookies, candy, and soda.  If you are not getting enough fiber from food, take products with added fiber in them (supplements).  Drink enough fluid to keep your pee (urine) clear or pale yellow.  Go to the restroom when you feel like you need to poop. Do not hold it.  Only take medicine as told by your doctor. Do not take medicines that help you poop (laxatives)  without talking to your doctor first.  Exercise on a regular basis, or as told by your doctor. GET HELP RIGHT AWAY IF:   You have bright red blood in your poop (stool).  Your constipation lasts more than 4 days or gets worse.  You have belly (abdomen) or butt (rectal) pain.  You have thin poop (as thin as a pencil).  You lose weight, and it cannot be explained. MAKE SURE YOU:   Understand these instructions.  Will watch your condition.  Will get help right away if you are not doing well or get worse. Document Released: 03/08/2008 Document Revised: 12/13/2011 Document Reviewed: 07/02/2013 [facility] Patient Information 2014 [place], [place].

## 2013-12-17 LAB — OB RESULTS CONSOLE GBS: GBS: POSITIVE

## 2013-12-21 ENCOUNTER — Encounter (HOSPITAL_COMMUNITY): Payer: Self-pay | Admitting: General Practice

## 2013-12-21 ENCOUNTER — Inpatient Hospital Stay (HOSPITAL_COMMUNITY)
Admission: AD | Admit: 2013-12-21 | Discharge: 2013-12-21 | Disposition: A | Discharge status: Home / Self Care | Payer: BC Managed Care – PPO | Source: Ambulatory Visit | Attending: Obstetrics | Admitting: Obstetrics

## 2013-12-21 DIAGNOSIS — R109 Unspecified abdominal pain: Secondary | ICD-10-CM | POA: Insufficient documentation

## 2013-12-21 DIAGNOSIS — O219 Vomiting of pregnancy, unspecified: Secondary | ICD-10-CM

## 2013-12-21 DIAGNOSIS — O9989 Other specified diseases and conditions complicating pregnancy, childbirth and the puerperium: Secondary | ICD-10-CM

## 2013-12-21 DIAGNOSIS — A64 Unspecified sexually transmitted disease: Secondary | ICD-10-CM

## 2013-12-21 DIAGNOSIS — O98319 Other infections with a predominantly sexual mode of transmission complicating pregnancy, unspecified trimester: Secondary | ICD-10-CM

## 2013-12-21 DIAGNOSIS — A5400 Gonococcal infection of lower genitourinary tract, unspecified: Secondary | ICD-10-CM | POA: Insufficient documentation

## 2013-12-21 DIAGNOSIS — O98219 Gonorrhea complicating pregnancy, unspecified trimester: Secondary | ICD-10-CM | POA: Insufficient documentation

## 2013-12-21 DIAGNOSIS — O212 Late vomiting of pregnancy: Secondary | ICD-10-CM | POA: Insufficient documentation

## 2013-12-21 DIAGNOSIS — R12 Heartburn: Secondary | ICD-10-CM | POA: Insufficient documentation

## 2013-12-21 DIAGNOSIS — O99891 Other specified diseases and conditions complicating pregnancy: Secondary | ICD-10-CM

## 2013-12-21 DIAGNOSIS — K219 Gastro-esophageal reflux disease without esophagitis: Secondary | ICD-10-CM

## 2013-12-21 DIAGNOSIS — R1084 Generalized abdominal pain: Secondary | ICD-10-CM

## 2013-12-21 MED ORDER — PROMETHAZINE HCL 25 MG/ML IJ SOLN
12.5000 mg | Freq: Once | INTRAMUSCULAR | Status: AC
  Administered 2013-12-21: 12.5 mg via INTRAMUSCULAR
  Filled 2013-12-21: qty 1

## 2013-12-21 MED ORDER — CEFTRIAXONE SODIUM 250 MG IJ SOLR
250.0000 mg | Freq: Once | INTRAMUSCULAR | Status: AC
  Administered 2013-12-21: 250 mg via INTRAMUSCULAR
  Filled 2013-12-21: qty 250

## 2013-12-21 MED ORDER — GI COCKTAIL ~~LOC~~
30.0000 mL | Freq: Once | ORAL | Status: AC
  Administered 2013-12-21: 30 mL via ORAL
  Filled 2013-12-21: qty 30

## 2013-12-21 MED ORDER — AZITHROMYCIN 250 MG PO TABS
1000.0000 mg | ORAL_TABLET | Freq: Once | ORAL | Status: AC
  Administered 2013-12-21: 1000 mg via ORAL
  Filled 2013-12-21: qty 4

## 2013-12-21 NOTE — Discharge Instructions (Signed)

## 2013-12-21 NOTE — MAU Provider Note (Signed)
History     CSN: [id number]  Arrival date and time: 12/21/13 1544   First Provider Initiated Contact with Patient 12/21/13 1631      Chief Complaint  Patient presents with  . Rectal Pain  . Heartburn  . Abdominal Cramping  . Vaginal Discharge   HPI  [name] is a 23 y.o. female [id number] [phone] who presents to MAU with complaints of rectal pressure, abdominal pain, heartburn, and a positive gonorrhea swab in Dr. Elsie StainMarshall's office. Patient was referred to health department for treatment, however was unable to make it there for treatment. She would like treatment today in MAU for gonorrhea.    OB History   Grav Para Term Preterm Abortions TAB SAB Ect Mult Living   3    2  2    0      Past Medical History  Diagnosis Date  . Asthma   . BV (bacterial vaginosis)   . Gonorrhea   . Chlamydia   . ADHD (attention deficit hyperactivity disorder)     since childhood    Past Surgical History  Procedure Laterality Date  . No past surgeries      Family History  Problem Relation Age of Onset  . Cervical cancer Paternal Aunt   . Hypertension Father   . Congestive Heart Failure Maternal Grandmother   . Hypertension Mother     History  Substance Use Topics  . Smoking status: Never Smoker   . Smokeless tobacco: Never Used  . Alcohol Use: No    Allergies:  Allergies  Allergen Reactions  . Shellfish Allergy Anaphylaxis  . Adhesive [Tape] Swelling and Other (See Comments)    Causes bruising.  . Betadine [Povidone Iodine] Rash    Prescriptions prior to admission  Medication Sig Dispense Refill  . acetaminophen (TYLENOL) 500 MG tablet Take 500 mg by mouth every 6 (six) hours as needed for mild pain, moderate pain or headache.       . ENSURE (ENSURE) Take 1 mL by mouth 3 (three) times daily between meals.      . pantoprazole (PROTONIX) 40 MG tablet Take 1 tablet (40 mg total) by mouth daily.  30 tablet  1  . promethazine (PHENERGAN) 12.5 MG tablet Take 1 tablet  (12.5 mg total) by mouth every 6 (six) hours as needed for nausea or vomiting.  30 tablet  1  . promethazine (PHENERGAN) 25 MG suppository Place 1 suppository (25 mg total) rectally every 6 (six) hours as needed for nausea or vomiting.  12 each  1  . ranitidine (ZANTAC) 150 MG tablet Take 1 tablet (150 mg total) by mouth 2 (two) times daily.  60 tablet  1   Review of Systems  Gastrointestinal: Positive for heartburn, nausea and vomiting. Negative for abdominal pain.  Genitourinary: Negative for dysuria, urgency, frequency and hematuria.       No vaginal discharge. No vaginal bleeding. No dysuria. Occasional rectal pressure   Physical Exam   Blood pressure 118/77, pulse 101, temperature 98.9 F (37.2 C), resp. rate 18, height 5\' 6"  (1.676 m), weight 88.814 kg (195 lb 12.8 oz), last menstrual period 04/16/2013.  Physical Exam  Constitutional: She is oriented to person, place, and time. She appears well-developed and well-nourished. No distress.  HENT:  Head: Normocephalic.  Eyes: Pupils are equal, round, and reactive to light.  Neck: Neck supple.  Respiratory: Effort normal.  Musculoskeletal: Normal range of motion.  Neurological: She is alert and oriented to person, place, and  time.  Skin: Skin is warm. She is not diaphoretic.  Psychiatric: Her behavior is normal.   Fetal Tracing: Baseline: 140 bpm Variability: Moderate  Accelerations: 15x15 Decelerations: None Toco: None; no contractions     MAU Course  Procedures None  MDM Azithromycin 1 gram Rocephin 250 mg IM  Gi cocktail; Patient feels much better follow medication. Rates her heartburn 0/10.  IM Phenergan 12.5 mg per patient requests.    Assessment and Plan   A:  1. STD (sexually transmitted disease) complicating pregnancy, antepartum   2. GERD (gastroesophageal reflux disease)   3. Nausea and vomiting in pregnancy     P;  Discharge home in stable condition Follow up with Dr. Gaynell Face Continue  phenergan at home. Labor precautions discussed Kick counts discussed  Partner needs to be treated; no intercourse for 10 days following partner treatment.    Iona Hansen Rasch, NP  12/21/2013, 8:14 PM

## 2013-12-21 NOTE — MAU Note (Signed)
Pt presents to MAU with c/o anal pain caused by abdominal pressure and cramping.  Pt states she has had cramps since 0330 this AM. Pt also reports an unusually heavy discharge that is thick and light gray. Pt also reports heartburn and vomiting.

## 2013-12-24 LAB — OB RESULTS CONSOLE RPR: RPR: NONREACTIVE

## 2013-12-24 LAB — OB RESULTS CONSOLE ABO/RH: RH Type: POSITIVE

## 2013-12-24 LAB — OB RESULTS CONSOLE GC/CHLAMYDIA
Chlamydia: NEGATIVE
GC PROBE AMP, GENITAL: NEGATIVE

## 2013-12-24 LAB — OB RESULTS CONSOLE HEPATITIS B SURFACE ANTIGEN: Hepatitis B Surface Ag: NEGATIVE

## 2013-12-24 LAB — OB RESULTS CONSOLE RUBELLA ANTIBODY, IGM: Rubella: IMMUNE

## 2013-12-24 LAB — OB RESULTS CONSOLE HIV ANTIBODY (ROUTINE TESTING): HIV: NONREACTIVE

## 2013-12-24 LAB — OB RESULTS CONSOLE ANTIBODY SCREEN: ANTIBODY SCREEN: NEGATIVE

## 2013-12-31 ENCOUNTER — Ambulatory Visit (HOSPITAL_COMMUNITY)
Admission: RE | Admit: 2013-12-31 | Discharge: 2013-12-31 | Disposition: A | Discharge status: Home / Self Care | Payer: BC Managed Care – PPO | Source: Ambulatory Visit | Attending: Obstetrics | Admitting: Obstetrics

## 2013-12-31 DIAGNOSIS — O9989 Other specified diseases and conditions complicating pregnancy, childbirth and the puerperium: Secondary | ICD-10-CM | POA: Insufficient documentation

## 2013-12-31 DIAGNOSIS — O358XX0 Maternal care for other (suspected) fetal abnormality and damage, not applicable or unspecified: Secondary | ICD-10-CM | POA: Insufficient documentation

## 2013-12-31 DIAGNOSIS — Z3689 Encounter for other specified antenatal screening: Secondary | ICD-10-CM

## 2013-12-31 DIAGNOSIS — O210 Mild hyperemesis gravidarum: Secondary | ICD-10-CM | POA: Insufficient documentation

## 2014-01-10 ENCOUNTER — Other Ambulatory Visit: Payer: Self-pay | Admitting: Obstetrics

## 2014-01-13 ENCOUNTER — Inpatient Hospital Stay (HOSPITAL_COMMUNITY)
Admission: AD | Admit: 2014-01-13 | Discharge: 2014-01-13 | Disposition: A | Discharge status: Home / Self Care | Payer: BC Managed Care – PPO | Source: Ambulatory Visit | Attending: Obstetrics | Admitting: Obstetrics

## 2014-01-13 ENCOUNTER — Encounter (HOSPITAL_COMMUNITY): Payer: Self-pay | Admitting: *Deleted

## 2014-01-13 DIAGNOSIS — O479 False labor, unspecified: Secondary | ICD-10-CM | POA: Insufficient documentation

## 2014-01-13 NOTE — MAU Note (Signed)
Contractions for the last three hours. Not sure how often. Denies vaginal bleeding and leaking of fluid. Good fetal movement.

## 2014-01-13 NOTE — Discharge Instructions (Signed)

## 2014-01-16 ENCOUNTER — Encounter (HOSPITAL_COMMUNITY): Payer: Medicaid Other | Admitting: Anesthesiology

## 2014-01-16 ENCOUNTER — Inpatient Hospital Stay (HOSPITAL_COMMUNITY): Payer: Medicaid Other | Admitting: Anesthesiology

## 2014-01-16 ENCOUNTER — Encounter (HOSPITAL_COMMUNITY): Payer: Self-pay | Admitting: *Deleted

## 2014-01-16 ENCOUNTER — Inpatient Hospital Stay (HOSPITAL_COMMUNITY)
Admission: AD | Admit: 2014-01-16 | Discharge: 2014-01-18 | DRG: 775 | Disposition: A | Discharge status: Home / Self Care | Payer: Medicaid Other | Source: Ambulatory Visit | Attending: Obstetrics | Admitting: Obstetrics

## 2014-01-16 DIAGNOSIS — O9902 Anemia complicating childbirth: Secondary | ICD-10-CM | POA: Diagnosis present

## 2014-01-16 DIAGNOSIS — K219 Gastro-esophageal reflux disease without esophagitis: Secondary | ICD-10-CM | POA: Diagnosis present

## 2014-01-16 DIAGNOSIS — J45909 Unspecified asthma, uncomplicated: Secondary | ICD-10-CM | POA: Diagnosis present

## 2014-01-16 DIAGNOSIS — Z22330 Carrier of Group B streptococcus: Secondary | ICD-10-CM

## 2014-01-16 DIAGNOSIS — D649 Anemia, unspecified: Secondary | ICD-10-CM | POA: Diagnosis present

## 2014-01-16 DIAGNOSIS — O99892 Other specified diseases and conditions complicating childbirth: Secondary | ICD-10-CM | POA: Diagnosis present

## 2014-01-16 DIAGNOSIS — O9989 Other specified diseases and conditions complicating pregnancy, childbirth and the puerperium: Secondary | ICD-10-CM

## 2014-01-16 HISTORY — DX: Vomiting, unspecified: R11.10

## 2014-01-16 LAB — CBC
HEMATOCRIT: 31.5 % — AB (ref 36.0–46.0)
Hemoglobin: 10.5 g/dL — ABNORMAL LOW (ref 12.0–15.0)
MCH: 27.2 pg (ref 26.0–34.0)
MCHC: 33.3 g/dL (ref 30.0–36.0)
MCV: 81.6 fL (ref 78.0–100.0)
PLATELETS: 296 10*3/uL (ref 150–400)
RBC: 3.86 MIL/uL — ABNORMAL LOW (ref 3.87–5.11)
RDW: 13.8 % (ref 11.5–15.5)
WBC: 7.6 10*3/uL (ref 4.0–10.5)

## 2014-01-16 LAB — ABO/RH: ABO/RH(D): O POS

## 2014-01-16 LAB — RPR

## 2014-01-16 MED ORDER — OXYTOCIN 40 UNITS IN LACTATED RINGERS INFUSION - SIMPLE MED
1.0000 m[IU]/min | INTRAVENOUS | Status: DC
  Administered 2014-01-16: 2 m[IU]/min via INTRAVENOUS
  Filled 2014-01-16: qty 1000

## 2014-01-16 MED ORDER — OXYCODONE-ACETAMINOPHEN 5-325 MG PO TABS
1.0000 | ORAL_TABLET | ORAL | Status: DC | PRN
  Administered 2014-01-17: 1 via ORAL
  Administered 2014-01-17 – 2014-01-18 (×2): 2 via ORAL
  Filled 2014-01-16: qty 2
  Filled 2014-01-16: qty 1
  Filled 2014-01-16: qty 2

## 2014-01-16 MED ORDER — PRENATAL MULTIVITAMIN CH
1.0000 | ORAL_TABLET | Freq: Every day | ORAL | Status: DC
  Administered 2014-01-17: 1 via ORAL
  Filled 2014-01-16: qty 1

## 2014-01-16 MED ORDER — SIMETHICONE 80 MG PO CHEW: 80.0000 mg | CHEWABLE_TABLET | ORAL | Status: DC | PRN

## 2014-01-16 MED ORDER — ZOLPIDEM TARTRATE 5 MG PO TABS: 5.0000 mg | ORAL_TABLET | Freq: Every evening | ORAL | Status: DC | PRN

## 2014-01-16 MED ORDER — BENZOCAINE-MENTHOL 20-0.5 % EX AERO
1.0000 "application " | INHALATION_SPRAY | CUTANEOUS | Status: DC | PRN
  Administered 2014-01-17: 1 via TOPICAL
  Filled 2014-01-16: qty 56

## 2014-01-16 MED ORDER — FENTANYL 2.5 MCG/ML BUPIVACAINE 1/10 % EPIDURAL INFUSION (WH - ANES)
14.0000 mL/h | INTRAMUSCULAR | Status: DC | PRN
  Administered 2014-01-16 (×2): 14 mL/h via EPIDURAL
  Filled 2014-01-16: qty 125

## 2014-01-16 MED ORDER — DEXTROSE 5 % IV SOLN
5.0000 10*6.[IU] | Freq: Once | INTRAVENOUS | Status: AC
  Administered 2014-01-16: 5 10*6.[IU] via INTRAVENOUS
  Filled 2014-01-16: qty 5

## 2014-01-16 MED ORDER — FERROUS SULFATE 325 (65 FE) MG PO TABS
325.0000 mg | ORAL_TABLET | Freq: Two times a day (BID) | ORAL | Status: DC
  Administered 2014-01-17 (×2): 325 mg via ORAL
  Filled 2014-01-16 (×2): qty 1

## 2014-01-16 MED ORDER — ONDANSETRON HCL 4 MG/2ML IJ SOLN: 4.0000 mg | INTRAMUSCULAR | Status: DC | PRN

## 2014-01-16 MED ORDER — OXYCODONE-ACETAMINOPHEN 5-325 MG PO TABS
1.0000 | ORAL_TABLET | ORAL | Status: DC | PRN
  Administered 2014-01-16 (×2): 2 via ORAL
  Administered 2014-01-17: 1 via ORAL
  Filled 2014-01-16: qty 1
  Filled 2014-01-16 (×2): qty 2

## 2014-01-16 MED ORDER — LIDOCAINE HCL (PF) 1 % IJ SOLN
30.0000 mL | INTRAMUSCULAR | Status: DC | PRN
  Filled 2014-01-16: qty 30

## 2014-01-16 MED ORDER — DEXTROSE 5 % IV SOLN
2.5000 10*6.[IU] | INTRAVENOUS | Status: DC
  Administered 2014-01-16 (×2): 2.5 10*6.[IU] via INTRAVENOUS
  Filled 2014-01-16 (×5): qty 2.5

## 2014-01-16 MED ORDER — LACTATED RINGERS IV SOLN: 500.0000 mL | Freq: Once | INTRAVENOUS | Status: DC

## 2014-01-16 MED ORDER — ACETAMINOPHEN 325 MG PO TABS
650.0000 mg | ORAL_TABLET | ORAL | Status: DC | PRN
  Administered 2014-01-16: 650 mg via ORAL
  Filled 2014-01-16: qty 2

## 2014-01-16 MED ORDER — BUTORPHANOL TARTRATE 1 MG/ML IJ SOLN
1.0000 mg | INTRAMUSCULAR | Status: DC | PRN
  Administered 2014-01-16: 1 mg via INTRAVENOUS
  Filled 2014-01-16: qty 1

## 2014-01-16 MED ORDER — SENNOSIDES-DOCUSATE SODIUM 8.6-50 MG PO TABS
2.0000 | ORAL_TABLET | ORAL | Status: DC
  Administered 2014-01-17 – 2014-01-18 (×2): 2 via ORAL
  Filled 2014-01-16 (×2): qty 2

## 2014-01-16 MED ORDER — LIDOCAINE HCL (PF) 1 % IJ SOLN
INTRAMUSCULAR | Status: DC | PRN
  Administered 2014-01-16 (×4): 4 mL

## 2014-01-16 MED ORDER — PHENYLEPHRINE 40 MCG/ML (10ML) SYRINGE FOR IV PUSH (FOR BLOOD PRESSURE SUPPORT)
80.0000 ug | PREFILLED_SYRINGE | INTRAVENOUS | Status: DC | PRN
  Filled 2014-01-16: qty 2

## 2014-01-16 MED ORDER — DIBUCAINE 1 % RE OINT: 1.0000 "application " | TOPICAL_OINTMENT | RECTAL | Status: DC | PRN

## 2014-01-16 MED ORDER — WITCH HAZEL-GLYCERIN EX PADS: 1.0000 "application " | MEDICATED_PAD | CUTANEOUS | Status: DC | PRN

## 2014-01-16 MED ORDER — LACTATED RINGERS IV SOLN
INTRAVENOUS | Status: DC
  Administered 2014-01-16 (×2): via INTRAVENOUS

## 2014-01-16 MED ORDER — ONDANSETRON HCL 4 MG PO TABS: 4.0000 mg | ORAL_TABLET | ORAL | Status: DC | PRN

## 2014-01-16 MED ORDER — OXYTOCIN 40 UNITS IN LACTATED RINGERS INFUSION - SIMPLE MED: 62.5000 mL/h | INTRAVENOUS | Status: DC

## 2014-01-16 MED ORDER — LANOLIN HYDROUS EX OINT: TOPICAL_OINTMENT | CUTANEOUS | Status: DC | PRN

## 2014-01-16 MED ORDER — TETANUS-DIPHTH-ACELL PERTUSSIS 5-2.5-18.5 LF-MCG/0.5 IM SUSP
0.5000 mL | Freq: Once | INTRAMUSCULAR | Status: AC
  Administered 2014-01-17: 0.5 mL via INTRAMUSCULAR

## 2014-01-16 MED ORDER — PHENYLEPHRINE 40 MCG/ML (10ML) SYRINGE FOR IV PUSH (FOR BLOOD PRESSURE SUPPORT)
PREFILLED_SYRINGE | INTRAVENOUS | Status: AC
  Filled 2014-01-16: qty 10

## 2014-01-16 MED ORDER — EPHEDRINE 5 MG/ML INJ
10.0000 mg | INTRAVENOUS | Status: DC | PRN
  Filled 2014-01-16: qty 2

## 2014-01-16 MED ORDER — OXYTOCIN BOLUS FROM INFUSION: 500.0000 mL | INTRAVENOUS | Status: DC

## 2014-01-16 MED ORDER — IBUPROFEN 600 MG PO TABS
600.0000 mg | ORAL_TABLET | Freq: Four times a day (QID) | ORAL | Status: DC
  Administered 2014-01-16 – 2014-01-18 (×6): 600 mg via ORAL
  Filled 2014-01-16 (×7): qty 1

## 2014-01-16 MED ORDER — DIPHENHYDRAMINE HCL 50 MG/ML IJ SOLN: 12.5000 mg | INTRAMUSCULAR | Status: DC | PRN

## 2014-01-16 MED ORDER — EPHEDRINE 5 MG/ML INJ
INTRAVENOUS | Status: AC
  Filled 2014-01-16: qty 4

## 2014-01-16 MED ORDER — IBUPROFEN 600 MG PO TABS: 600.0000 mg | ORAL_TABLET | Freq: Four times a day (QID) | ORAL | Status: DC | PRN

## 2014-01-16 MED ORDER — ONDANSETRON HCL 4 MG/2ML IJ SOLN
4.0000 mg | Freq: Four times a day (QID) | INTRAMUSCULAR | Status: DC | PRN
  Administered 2014-01-16: 4 mg via INTRAVENOUS
  Filled 2014-01-16: qty 2

## 2014-01-16 MED ORDER — FLEET ENEMA 7-19 GM/118ML RE ENEM: 1.0000 | ENEMA | RECTAL | Status: DC | PRN

## 2014-01-16 MED ORDER — FENTANYL 2.5 MCG/ML BUPIVACAINE 1/10 % EPIDURAL INFUSION (WH - ANES)
INTRAMUSCULAR | Status: AC
  Administered 2014-01-16: 14 mL/h via EPIDURAL
  Filled 2014-01-16: qty 125

## 2014-01-16 MED ORDER — TERBUTALINE SULFATE 1 MG/ML IJ SOLN: 0.2500 mg | Freq: Once | INTRAMUSCULAR | Status: DC | PRN

## 2014-01-16 MED ORDER — DIPHENHYDRAMINE HCL 25 MG PO CAPS
25.0000 mg | ORAL_CAPSULE | Freq: Four times a day (QID) | ORAL | Status: DC | PRN
Start: 2014-01-16 — End: 2014-01-18

## 2014-01-16 MED ORDER — CITRIC ACID-SODIUM CITRATE 334-500 MG/5ML PO SOLN: 30.0000 mL | ORAL | Status: DC | PRN

## 2014-01-16 MED ORDER — LACTATED RINGERS IV SOLN: 500.0000 mL | INTRAVENOUS | Status: DC | PRN

## 2014-01-16 NOTE — H&P (Signed)
This is Dr. Francoise CeoBernard Marshall dictating the history and physical on  Deanna Myers she's a 23 year old gravida 3 para 00-0 at 2639 weeks and 2 days EDC 01/21/2014 positive GBS for which she received penicillin admitted in labor she is now 3 cm 90% vertex -1 amniotomy performed the fluids clear she is contracting every 3-4 minutes tracing reactive Past medical history negative Past surgical history negative Social history negative System review noncontributory Physical exam well-developed female in labor HEENT negative Lungs clear to P&A Heart regular rhythm no murmurs no gallops Breasts negative Abdomen term estimated fetal weight 7 pounds Pelvic as described above Extremities negative and

## 2014-01-16 NOTE — MAU Note (Signed)
contractions 

## 2014-01-16 NOTE — Anesthesia Procedure Notes (Signed)
Epidural Patient location during procedure: OB Start time: 01/16/2014 9:27 AM  Staffing Performed by: anesthesiologist   Preanesthetic Checklist Completed: patient identified, site marked, surgical consent, pre-op evaluation, timeout performed, IV checked, risks and benefits discussed and monitors and equipment checked  Epidural Patient position: sitting Prep: ChloraPrep and site prepped and draped Patient monitoring: continuous pulse ox and blood pressure Approach: midline Injection technique: LOR air  Needle:  Needle type: Tuohy  Needle gauge: 17 G Needle length: 9 cm and 9 Needle insertion depth: 6 cm Catheter type: closed end flexible Catheter size: 19 Gauge Catheter at skin depth: 11 cm Test dose: negative  Assessment Events: blood not aspirated, injection not painful, no injection resistance, negative IV test and no paresthesia  Additional Notes Discussed risk of headache, infection, bleeding, nerve injury and failed or incomplete block.  Patient voices understanding and wishes to proceed.  Epidural placed easily on first attempt.  No paresthesia.  Patient tolerated procedure well with no apparent complications.  Epidural secured with tegaderms (which patient can tolerate) to avoid tape allergy.  Jasmine DecemberA. Kada Friesen, MDReason for block:procedure for pain

## 2014-01-16 NOTE — Anesthesia Preprocedure Evaluation (Signed)
Anesthesia Evaluation  Patient identified by MRN, date of birth, ID band Patient awake    Reviewed: Allergy & Precautions, H&P , NPO status , Patient's Chart, lab work & pertinent test results, reviewed documented beta blocker date and time   History of Anesthesia Complications Negative for: history of anesthetic complications  Airway Mallampati: I TM Distance: >3 FB Neck ROM: full    Dental  (+) Teeth Intact   Pulmonary asthma (last inhaler use prior to pregnancy) ,  breath sounds clear to auscultation        Cardiovascular negative cardio ROS  Rhythm:regular Rate:Normal     Neuro/Psych PSYCHIATRIC DISORDERS (ADHD) negative neurological ROS     GI/Hepatic Neg liver ROS, GERD-  Medicated,  Endo/Other  negative endocrine ROS  Renal/GU negative Renal ROS     Musculoskeletal   Abdominal   Peds  Hematology  (+) anemia ,   Anesthesia Other Findings Tongue piercing - asked to remove  Reproductive/Obstetrics (+) Pregnancy                           Anesthesia Physical Anesthesia Plan  ASA: II  Anesthesia Plan: Epidural   Post-op Pain Management:    Induction:   Airway Management Planned:   Additional Equipment:   Intra-op Plan:   Post-operative Plan:   Informed Consent: I have reviewed the patients History and Physical, chart, labs and discussed the procedure including the risks, benefits and alternatives for the proposed anesthesia with the patient or authorized representative who has indicated his/her understanding and acceptance.     Plan Discussed with:   Anesthesia Plan Comments:         Anesthesia Quick Evaluation

## 2014-01-17 LAB — CBC
HEMATOCRIT: 29.4 % — AB (ref 36.0–46.0)
Hemoglobin: 10 g/dL — ABNORMAL LOW (ref 12.0–15.0)
MCH: 28.1 pg (ref 26.0–34.0)
MCHC: 34 g/dL (ref 30.0–36.0)
MCV: 82.6 fL (ref 78.0–100.0)
Platelets: 270 10*3/uL (ref 150–400)
RBC: 3.56 MIL/uL — ABNORMAL LOW (ref 3.87–5.11)
RDW: 13.9 % (ref 11.5–15.5)
WBC: 9.1 10*3/uL (ref 4.0–10.5)

## 2014-01-17 MED ORDER — PNEUMOCOCCAL VAC POLYVALENT 25 MCG/0.5ML IJ INJ
0.5000 mL | INJECTION | Freq: Once | INTRAMUSCULAR | Status: AC
  Administered 2014-01-17: 0.5 mL via INTRAMUSCULAR
  Filled 2014-01-17: qty 0.5

## 2014-01-17 NOTE — Lactation Note (Signed)
This note was copied from the chart of Deanna Myers. Lactation Consultation Note Follow up visit per secretary mom is requesting LC help.  Mom is sitting up at edge of bed bouncing baby and trying to eat.  Baby has hand in mouth and rooting.  Explained these as feeding cues to mom.  She reports baby just eats for a few minutes and goes to sleep .  Encouraged mom to get a deep latch and stimulate baby to keep awake during feedings.  Reviewed feeding on demand with early cues.  Assisted mom with latching baby in cross cradle hold, although not ideal position as mom is sitting on side of the bed.  Much assistance needed at first and then mom unlatched baby and tried latch herself with good compression and baby latched well, with good suckling and swallows audible.  Encouraged mom to attempt feedings and [facility].  Lc to follow tomorrow.   Patient Name: Deanna Myers OZHYQ'MToday's Date: 01/17/2014 Reason for consult: Follow-up assessment;Difficult latch   Maternal Data    Feeding Feeding Type: Breast Fed Length of feed:  (observed 5 minutes)  LATCH Score/Interventions Latch: Grasps breast easily, tongue down, lips flanged, rhythmical sucking. Intervention(s): Skin to skin;Teach feeding cues Intervention(s): Breast compression;Assist with latch  Audible Swallowing: A few with stimulation Intervention(s): Skin to skin;Hand expression Intervention(s): Hand expression;Alternate breast massage  Type of Nipple: Everted at rest and after stimulation  Comfort (Breast/Nipple): Soft / non-tender     Hold (Positioning): Assistance needed to correctly position infant at breast and maintain latch. Intervention(s): Breastfeeding basics reviewed;Support Pillows;Position options;Skin to skin  LATCH Score: 8  Lactation Tools Discussed/Used     Consult Status Consult Status: Follow-up Date: 01/18/14 Follow-up type: In-patient    [name]  Shoptaw 01/17/2014, 11:12 PM

## 2014-01-17 NOTE — Lactation Note (Signed)
This note was copied from the chart of Deanna Charlsie QuestLashawna Korol. Lactation Consultation Note Initial visit at 24 hours of age.  Mom reports some latches, but doesn't feel like baby gets a deep latch.  Baby in process of PKU test with [facility] RN.  [facility] resources given and discussed.  Discussed feeding frequency and output for newborn.  Hand expression demonstrated with return demonstration and colostrum visible.  Baby's diaper changed, brown stool.  Baby sleepy and doesn't wake up with various waking techniques.   Drops of colostrum applied to babys mouth in football hold.  Mom to remain holding baby STS.  Mom to call for latch assist with next feeding.    Patient Name: Deanna Myers YNWGN'FToday's Date: 01/17/2014 Reason for consult: Initial assessment   Maternal Data Has patient been taught Hand Expression?: Yes Does the patient have breastfeeding experience prior to this delivery?: No  Feeding Feeding Type: Breast Fed Length of feed: 25 min  LATCH Score/Interventions Latch: Too sleepy or reluctant, no latch achieved, no sucking elicited.  Audible Swallowing: None  Type of Nipple: Everted at rest and after stimulation  Comfort (Breast/Nipple): Soft / non-tender     Hold (Positioning): No assistance needed to correctly position infant at breast. Intervention(s): Breastfeeding basics reviewed;Support Pillows;Position options;Skin to skin  LATCH Score: 6  Lactation Tools Discussed/Used     Consult Status Consult Status: Follow-up Date: 01/18/14 Follow-up type: In-patient    [name] Shoptaw 01/17/2014, 6:10 PM

## 2014-01-17 NOTE — Progress Notes (Signed)
Patient ID: Deanna Myers, female   DOB: [birth year], 23 y.o.   MRN: [id number] Postpartum day one Vital signs normal Fundus firm Lochia moderate Legs negative doing well

## 2014-01-17 NOTE — Anesthesia Postprocedure Evaluation (Signed)
Anesthesia Post Note  Patient: Deanna Myers  Procedure(s) Performed: * No procedures listed *  Anesthesia type: Epidural  Patient location: Mother/Baby  Post pain: Pain level controlled  Post assessment: Post-op Vital signs reviewed  Last Vitals:  Filed Vitals:   01/17/14 0640  BP: 101/85  Pulse: 77  Temp: 36.7 C  Resp: 19    Post vital signs: Reviewed  Level of consciousness:alert  Complications: No apparent anesthesia complications

## 2014-01-18 NOTE — Progress Notes (Signed)
Ur chart review completed.  

## 2014-01-18 NOTE — Discharge Instructions (Signed)
Discharge instructions   You can wash your hair  Shower  Eat what you want  Drink what you want  See me in 6 weeks  Your ankles are going to swell more in the next 2 weeks than when pregnant  No sex for 6 weeks   Kathreen CosierBernard A Darryle Dennie, MD 01/18/2014

## 2014-01-18 NOTE — Discharge Summary (Signed)
Obstetric Discharge Summary Reason for Admission: onset of labor Prenatal Procedures: none Intrapartum Procedures: spontaneous vaginal delivery Postpartum Procedures: none Complications-Operative and Postpartum: none Hemoglobin  Date Value Ref Range Status  01/17/2014 10.0* 12.0 - 15.0 g/dL Final     HCT  Date Value Ref Range Status  01/17/2014 29.4* 36.0 - 46.0 % Final    Physical Exam:  General: alert Lochia: appropriate Uterine Fundus: firm Incision: healing well DVT Evaluation: No evidence of DVT seen on physical exam.  Discharge Diagnoses: Term Pregnancy-delivered  Discharge Information: Date: 01/18/2014 Activity: pelvic rest Diet: routine Medications: Percocet Condition: stable Instructions: refer to practice specific booklet Discharge to: home Follow-up Information   Follow up with Kathreen CosierMARSHALL,Zayley Arras A, MD.   Specialty:  Obstetrics and Gynecology   Contact information:   [address] [place] [id number] [phone]       Newborn Data: Live born female  Birth Weight: 6 lb 5 oz (2863 g) APGAR: 9, 9  Home with mother.  Kathreen CosierBernard A Laportia Carley 01/18/2014, 7:30 AM

## 2014-01-21 ENCOUNTER — Ambulatory Visit: Payer: Self-pay

## 2014-01-21 NOTE — Lactation Note (Signed)
This note was copied from the chart of [name]. Adult Lactation Consultation Outpatient Visit Note  Patient Name: Deanna SawyerLa'Mya Liguori Date of Birth: 01/16/2013 Gestational Age at Delivery: Unknown Type of Delivery:   Breastfeeding History: Frequency of Breastfeeding: q 2-3 hours Length of Feeding: 30-45 Voids: QS had void while here for appointment Stools: QS- had large yellow stool while here for appointment  Supplementing / Method: Pumping:  Type of Pump: hand pump   Frequency:1-2 times/ day for comfort  Volume:  1-1/2 oz  Comments:    Consultation Evaluation: Mom here today for feeding assessment due to sore nipples. Both nipples look slightly pink and have cracking noted around the base of nipple. Grandmother reports that she has not been taking the baby off the breast correctly and that is probably making them hurt. She is also using #24 flange and pumping hurts. #27 and #30 given to mom to try.   Initial Feeding Assessment: Pre-feed Weight: 6- 3.7  2826 Post-feed Weight: 6- 5.9  2890g Amount Transferred: 64 cc's Comments: Observed mom latch baby in football hold on left breast. Encouraged to wait for wide open mouth. Good latch noted and mom reports that it still hurts. Mom reports that it is getting better than yesterday. Grandmother reports that she is not making faces like she was when the baby latched on. La'Mya nursed for 15 minutes with lots of swallows noted. Baby sleepy when she came off the breast.   Additional Feeding Assessment: Pre-feed Weight: 6- 5.9  2890g Post-feed Weight: 6- 6.1  2894 Amount Transferred: 4 cc's Comments: Latched La'Mya to right breast to see if we could soften it some. Leaking while nursing on the other breast. Breast did soften some but baby would not nurse any more.    Total Breast milk Transferred this Visit: 68 cc's Total Supplement Given: 0  Additional Interventions: Use larger flanges when pumping.  Wide open mouth, deep onto the  breast.  EBM to nipples after nursing.  Comfort gels as needed.  Follow-Up With Ped To call here with questions/concerns or if needs to make another appointment     Pamelia HoitDonna D Jennfier Abdulla 01/21/2014, 4:58 PM

## 2014-04-30 IMAGING — US US OB COMP LESS 14 WK
2 series · 14 of 28 positions shown · non-contrast
Comparison: No similar prior study is available for comparison.

CLINICAL DATA: Positive pregnancy test, hyperemesis

OBSTETRIC <14 WK ULTRASOUND
TECHNIQUE: Transabdominal ultrasound was performed for evaluation
of the gestation as well as the maternal uterus and adnexal
regions.

[Series 1: us ob comp less 14 wks · 41 acquisitions, 13 frames shown (1 of 2)]
[im 2/41]
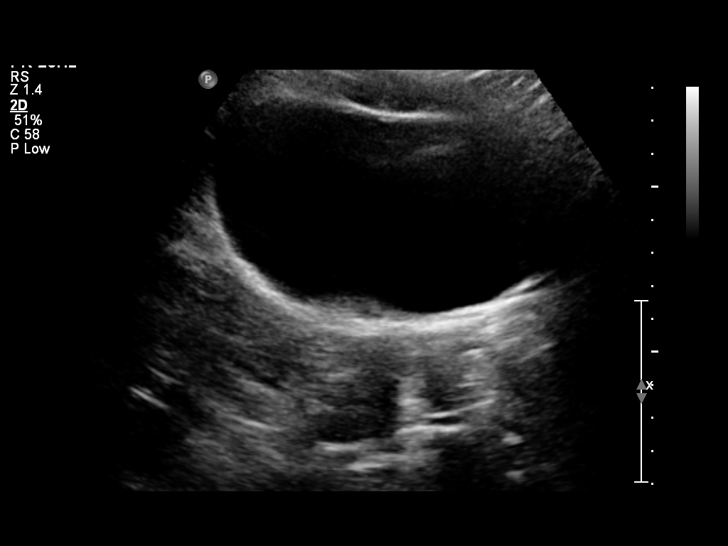
[im 5/41]
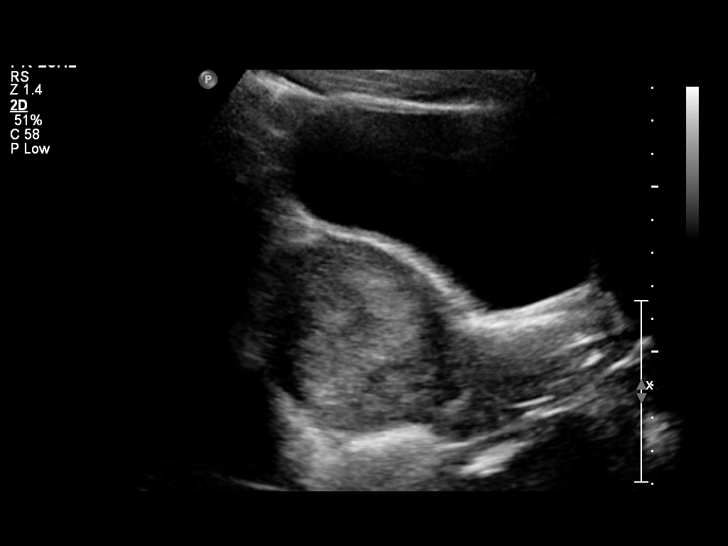
[im 8/41]
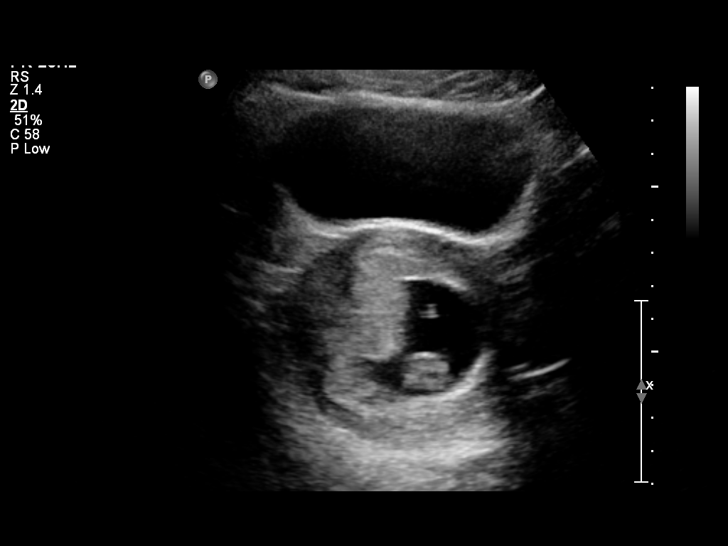
[im 11/41]
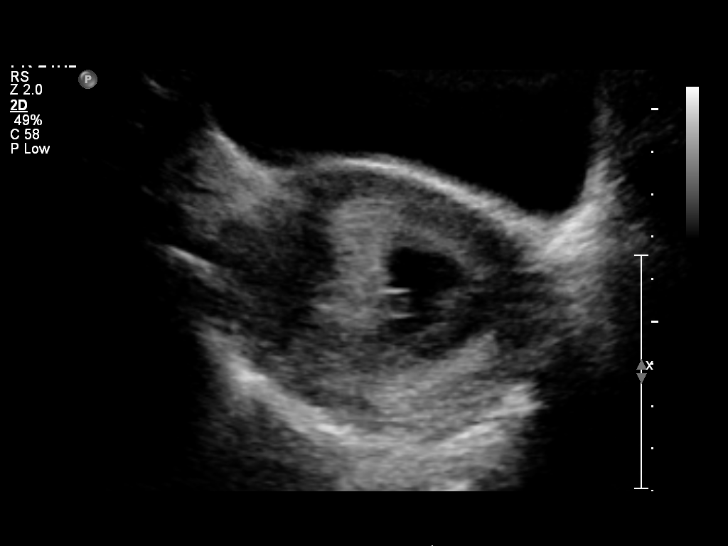
[im 14/41]
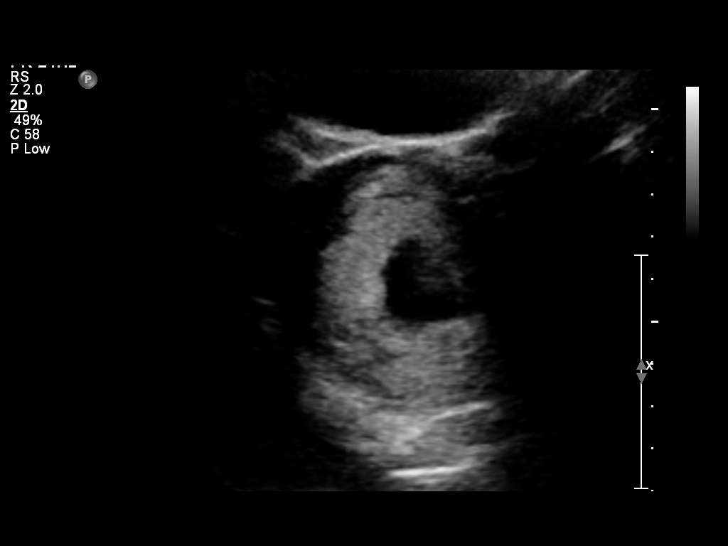
[im 17/41]
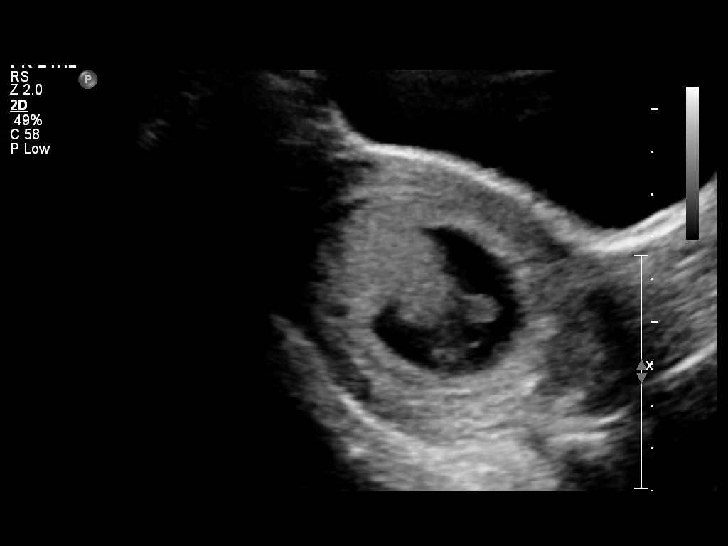
[im 21/41]
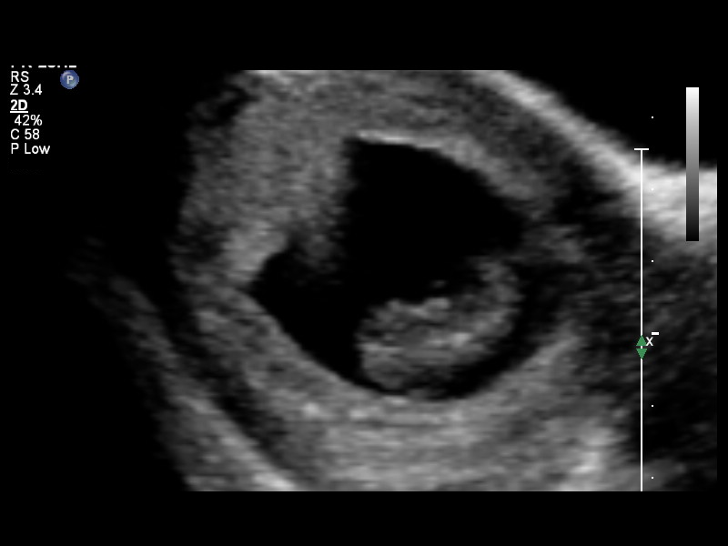
[im 24/41]
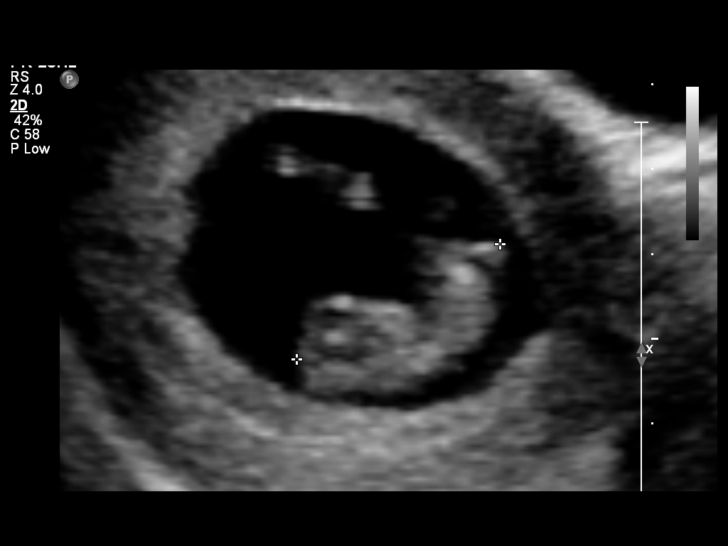
[im 27/41]
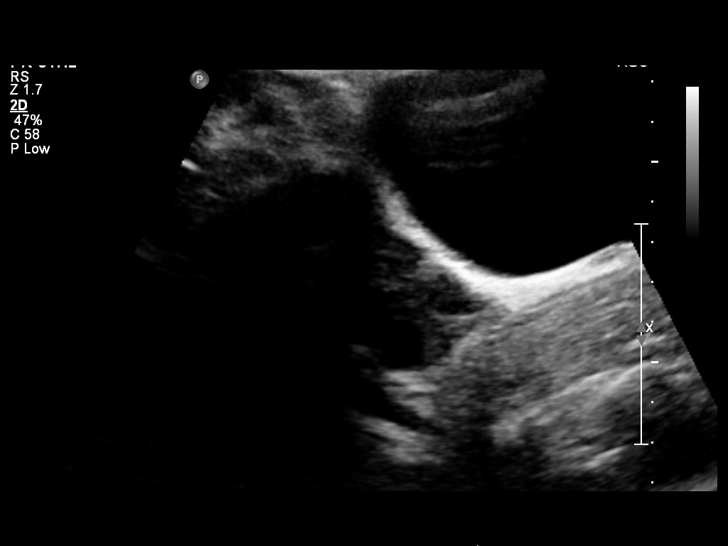
[im 30/41]
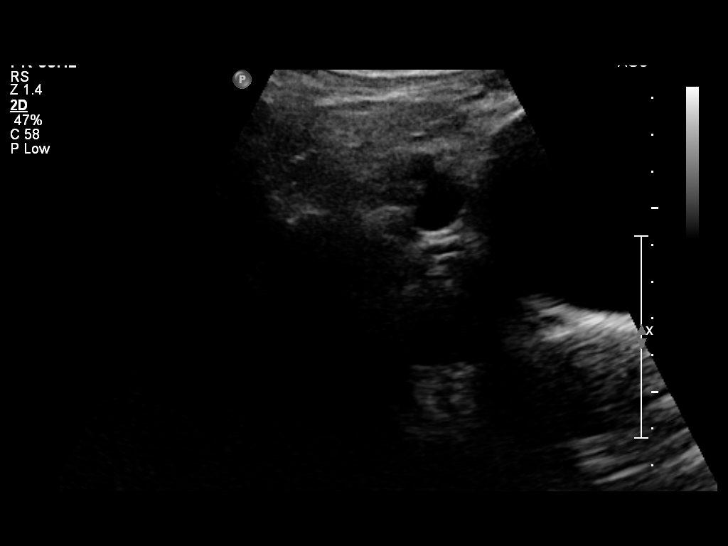
[im 33/41]
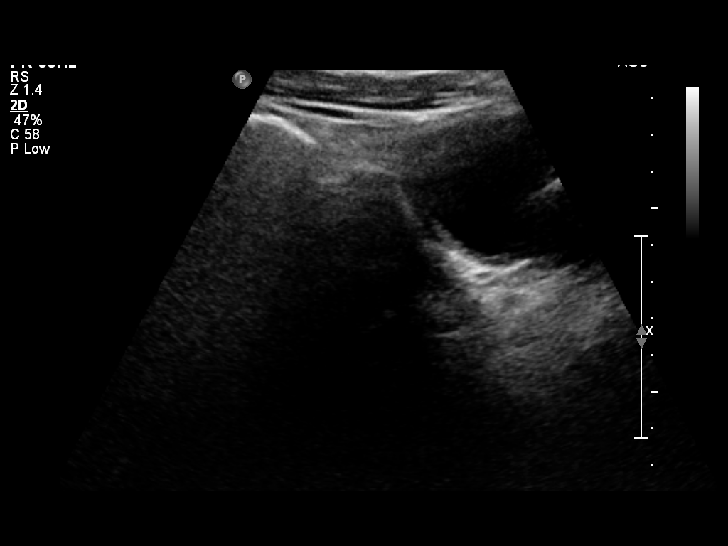
[im 36/41]
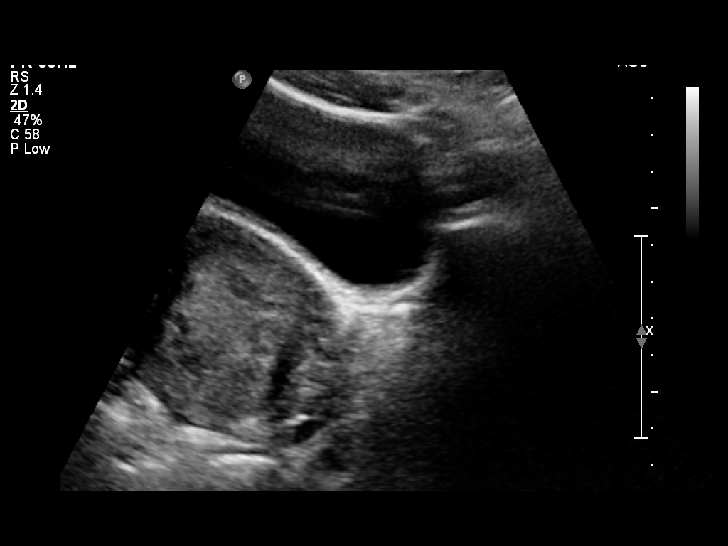
[im 39/41]
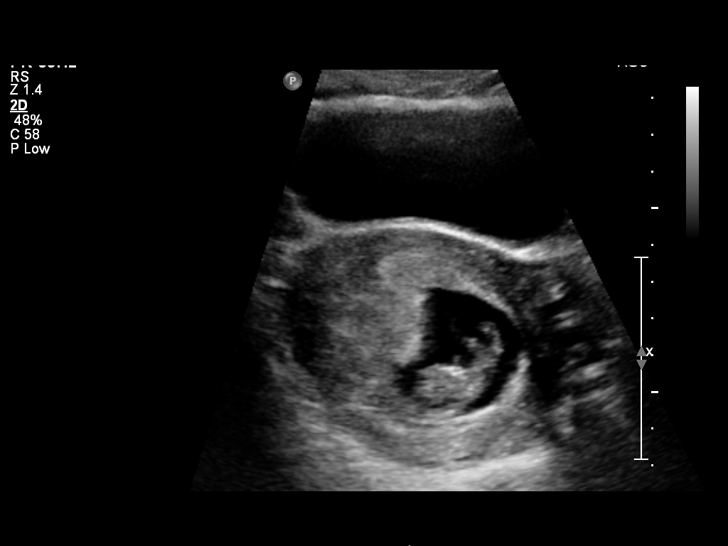

[Series 1: us ob comp less 14 wks · 1 of 1 slices shown (2 of 2)]
[im 1/1]
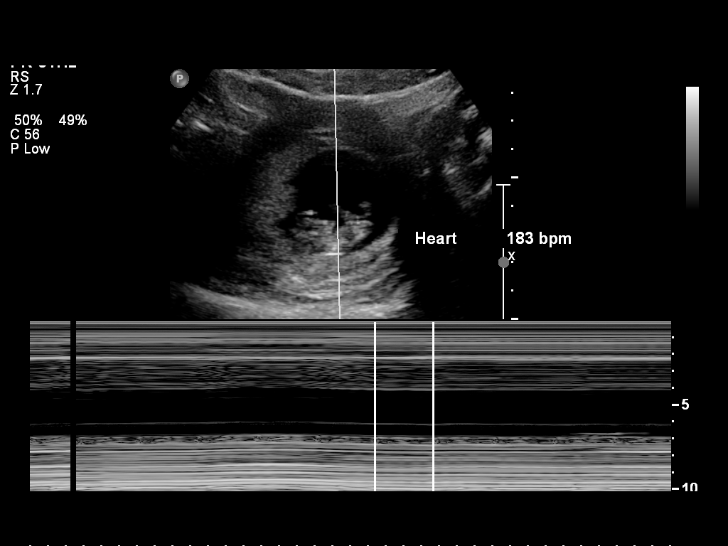

[14 of 28 positions shown; findings below may reference images not displayed]

Intrauterine gestational sac: Visualized, normal in shape
Yolk sac: Visualized
Embryo: Visualized
Cardiac Activity: Visualized
Heart Rate: 188 bpm

CRL:  28 mm  9 w  5 d         US EDC: 01/20/14

Maternal uterus/Adnexae:
Normal ovaries.
IMPRESSION: Single live intrauterine gestation with concordant measurements
compared to assigned gestational age by LMP of 9-week 4 days.  EDC
by LMP 01/21/2014.  No acute abnormality.

## 2014-06-22 ENCOUNTER — Inpatient Hospital Stay (HOSPITAL_COMMUNITY)
Admission: AD | Admit: 2014-06-22 | Discharge: 2014-06-23 | Disposition: A | Discharge status: Home / Self Care | Payer: BC Managed Care – PPO | Source: Ambulatory Visit | Attending: Obstetrics & Gynecology | Admitting: Obstetrics & Gynecology

## 2014-06-22 ENCOUNTER — Encounter (HOSPITAL_COMMUNITY): Payer: Self-pay | Admitting: *Deleted

## 2014-06-22 DIAGNOSIS — F526 Dyspareunia not due to a substance or known physiological condition: Secondary | ICD-10-CM | POA: Diagnosis not present

## 2014-06-22 DIAGNOSIS — N760 Acute vaginitis: Secondary | ICD-10-CM | POA: Diagnosis not present

## 2014-06-22 DIAGNOSIS — A499 Bacterial infection, unspecified: Secondary | ICD-10-CM | POA: Diagnosis not present

## 2014-06-22 DIAGNOSIS — N949 Unspecified condition associated with female genital organs and menstrual cycle: Secondary | ICD-10-CM | POA: Insufficient documentation

## 2014-06-22 DIAGNOSIS — B9689 Other specified bacterial agents as the cause of diseases classified elsewhere: Secondary | ICD-10-CM | POA: Insufficient documentation

## 2014-06-22 LAB — URINALYSIS, ROUTINE W REFLEX MICROSCOPIC
Bilirubin Urine: NEGATIVE
Glucose, UA: NEGATIVE mg/dL
Hgb urine dipstick: NEGATIVE
Ketones, ur: NEGATIVE mg/dL
Leukocytes, UA: NEGATIVE
Nitrite: NEGATIVE
Protein, ur: NEGATIVE mg/dL
SPECIFIC GRAVITY, URINE: 1.015 (ref 1.005–1.030)
Urobilinogen, UA: 1 mg/dL (ref 0.0–1.0)
pH: 7 (ref 5.0–8.0)

## 2014-06-22 LAB — POCT PREGNANCY, URINE: Preg Test, Ur: NEGATIVE

## 2014-06-22 NOTE — MAU Note (Signed)
Pt reports she has had severe vaginal pain and discomfort today.

## 2014-06-22 NOTE — MAU Provider Note (Signed)
Chief Complaint: Vaginal Pain   First Provider Initiated Contact with Patient 06/22/14 2354     SUBJECTIVE HPI: Deanna Myers is a 23 y.o. [phone] female who presents with severe vaginal pain and pressure starting at 9 PM. Reports having moderately vaginal-penile sexual intercourse at 11 AM. No bleeding or significant discomfort immediately afterward. Sex was consensual. No condoms or lubricants used. Denies ever having similar pain in the past.  Past Medical History  Diagnosis Date  . BV (bacterial vaginosis)   . Gonorrhea   . Chlamydia   . ADHD (attention deficit hyperactivity disorder)     since childhood  . Hyperemesis     G3  . Asthma     inhaler last used prior to pregnancy   OB History  Gravida Para Term Preterm AB SAB TAB Ectopic Multiple Living  # Outcome Date GA Lbr Len/2nd Weight Sex Delivery Anes PTL Lv  3 TRM 01/16/14 [redacted]w[redacted]d 05:02 / 01:04 2.863 kg (6 lb 5 oz) F SVD EPI  Y  2 SAB 2013          1 SAB 2011             Past Surgical History  Procedure Laterality Date  . No past surgeries     History   Social History  . Marital Status: Single    Spouse Name: N/A    Number of Children: 0  . Years of Education: N/A   Occupational History  . Cook    Social History Main Topics  . Smoking status: Never Smoker   . Smokeless tobacco: Never Used  . Alcohol Use: No  . Drug Use: No  . Sexual Activity: Yes    Partners: Male    Birth Control/ Protection: None   Other Topics Concern  . Not on file   Social History Narrative  . No narrative on file   No current facility-administered medications on file prior to encounter.   No current outpatient prescriptions on file prior to encounter.   Allergies  Allergen Reactions  . Shellfish Allergy Anaphylaxis  . Adhesive [Tape] Swelling and Other (See Comments)    Causes bruising.  . Betadine [Povidone Iodine] Rash    ROS: Pertinent items in HPI. Denies fever, chills, nausea, vomiting,  diarrhea, constipation, urinary complaints, intermenstrual bleeding, dyspareunia.  OBJECTIVE Blood pressure 135/71, pulse 81, temperature 98.5 F (36.9 C), temperature source Oral, resp. rate 16, height  (1.676 m), weight 96.163 kg (212 lb), SpO2 100.00%, unknown if currently breastfeeding. GENERAL: Well-developed, well-nourished female in mild-moderate distress. Holding breath. HEENT: Normocephalic HEART: normal rate RESP: normal effort ABDOMEN: Soft, non-tender EXTREMITIES: Nontender, no edema NEURO: Alert and oriented SPECULUM EXAM: NEFG, mild generalized erythema. Mild swelling. No masses suspicious for hematoma palpated or visualized w/ thorough spec exam. Moderate amount of thin, white, malodorous discharge, no lacerations or blood noted, cervix clean BIMANUAL: cervix closed; uterus normal size, no adnexal tenderness or masses. Pt uncomfortable w/ exam.   LAB RESULTS Results for orders placed during the hospital encounter of 06/22/14 (from the past 24 hour(s))  URINALYSIS, ROUTINE W REFLEX MICROSCOPIC     Status: None   Collection Time    06/22/14 10:56 PM      Result Value Ref Range   Color, Urine YELLOW  YELLOW   APPearance CLEAR  CLEAR   Specific Gravity, Urine 1.015  1.005 - 1.030   pH 7.0  5.0 -  8.0   Glucose, UA NEGATIVE  NEGATIVE mg/dL   Hgb urine dipstick NEGATIVE  NEGATIVE   Bilirubin Urine NEGATIVE  NEGATIVE   Ketones, ur NEGATIVE  NEGATIVE mg/dL   Protein, ur NEGATIVE  NEGATIVE mg/dL   Urobilinogen, UA 1.0  0.0 - 1.0 mg/dL   Nitrite NEGATIVE  NEGATIVE   Leukocytes, UA NEGATIVE  NEGATIVE  POCT PREGNANCY, URINE     Status: None   Collection Time    06/22/14 11:11 PM      Result Value Ref Range   Preg Test, Ur NEGATIVE  NEGATIVE  WET PREP, GENITAL     Status: Abnormal   Collection Time    06/23/14 12:10 AM      Result Value Ref Range   Yeast Wet Prep HPF POC NONE SEEN  NONE SEEN   Trich, Wet Prep NONE SEEN  NONE SEEN   Clue Cells Wet Prep HPF POC FEW (*)  NONE SEEN   WBC, Wet Prep HPF POC FEW (*) NONE SEEN  CBC     Status: Abnormal   Collection Time    06/23/14 12:27 AM      Result Value Ref Range   WBC 5.4  4.0 - 10.5 K/uL   RBC 4.14  3.87 - 5.11 MIL/uL   Hemoglobin 11.0 (*) 12.0 - 15.0 g/dL   HCT 40.9 (*) 81.1 - 91.4 %   MCV 80.4  78.0 - 100.0 fL   MCH 26.6  26.0 - 34.0 pg   MCHC 33.0  30.0 - 36.0 g/dL   RDW 78.2  95.6 - 21.3 %   Platelets 280  150 - 400 K/uL    IMAGING No results found.  MAU COURSE CBC, pelvic ultrasound, wet prep, GC/Chlamydia.  [facility] at [facility]. Pt comfortable in his presence. Discussing exam. No concern for nonconsensual sex.   ASSESSMENT 1. Genito-pelvic pain related to vaginal penetration   2. BV (bacterial vaginosis)     PLAN Discharge home in stable condition. Pelvic rest until Sx resolved.      Follow-up Information   Follow up with Gynecologist. (As needed if no improvement in 3 days)       Follow up with [facility] [place] [facility]ERN[facility] ADMISSIONS. (As needed in emergencies)    Contact information:   [address] [id number] [place] [address] [phone]       Medication List         ibuprofen 600 MG tablet  Commonly known as:  ADVIL,MOTRIN  Take 1 tablet (600 mg total) by mouth every 6 (six) hours as needed.     metroNIDAZOLE 500 MG tablet  Commonly known as:  FLAGYL  Take 1 tablet (500 mg total) by mouth 2 (two) times daily.         [place], CNM 06/23/2014  1:55 AM

## 2014-06-22 NOTE — MAU Note (Addendum)
PT SAYS SHE HAD SEX  AT 11AM-  SAME GUY-  SAYS     MOD  ROUGH SEX-  NOT DRY..  PT NOT BLEEDING FROM  VAGINA-  BUT FEELS SWOLLEN-  AND HURTS- STARTED  9PM.     PT DEL VAG ON 01-16-14-  SAYS SHE TORE ON DEL   BREASTFEEDING .  WENT TO FOLLOW- UP  APPOINTMENT  -  ALL OK.

## 2014-06-23 ENCOUNTER — Inpatient Hospital Stay (HOSPITAL_COMMUNITY): Payer: BC Managed Care – PPO

## 2014-06-23 DIAGNOSIS — F526 Dyspareunia not due to a substance or known physiological condition: Secondary | ICD-10-CM | POA: Diagnosis not present

## 2014-06-23 LAB — CBC
HCT: 33.3 % — ABNORMAL LOW (ref 36.0–46.0)
Hemoglobin: 11 g/dL — ABNORMAL LOW (ref 12.0–15.0)
MCH: 26.6 pg (ref 26.0–34.0)
MCHC: 33 g/dL (ref 30.0–36.0)
MCV: 80.4 fL (ref 78.0–100.0)
PLATELETS: 280 10*3/uL (ref 150–400)
RBC: 4.14 MIL/uL (ref 3.87–5.11)
RDW: 14 % (ref 11.5–15.5)
WBC: 5.4 10*3/uL (ref 4.0–10.5)

## 2014-06-23 LAB — WET PREP, GENITAL
Trich, Wet Prep: NONE SEEN
YEAST WET PREP: NONE SEEN

## 2014-06-23 LAB — HIV ANTIBODY (ROUTINE TESTING W REFLEX): HIV 1&2 Ab, 4th Generation: NONREACTIVE

## 2014-06-23 MED ORDER — METRONIDAZOLE 500 MG PO TABS: 500.0000 mg | ORAL_TABLET | Freq: Two times a day (BID) | ORAL | Status: DC

## 2014-06-23 MED ORDER — IBUPROFEN 600 MG PO TABS: 600.0000 mg | ORAL_TABLET | Freq: Four times a day (QID) | ORAL | Status: DC | PRN

## 2014-06-23 NOTE — Discharge Instructions (Signed)
Dyspareunia °Dyspareunia is pain during sexual intercourse. It is most common in women, but it also happens in men.  °CAUSES  °Female °The pain from this condition is usually felt when anything is put into the vagina, but any part of the genitals may cause pain during sex. Even sitting or wearing pants can cause pain. Sometimes, a cause cannot be found. Some causes of pain during intercourse are: °· Infections of the skin around the vagina. °· Vaginal infections, such as a yeast, bacterial, or viral infection. °· Vaginismus. This is the inability to have anything put in the vagina even when the woman wants it to happen. There is an automatic muscle contraction and pain. The pain of the muscle contraction can be so severe that intercourse is impossible. °· Allergic reaction from spermicides, semen, condoms, scented tampons, soaps, douches, and vaginal sprays. °· A fluid-filled sac (cyst) on the Bartholin or Skene glands, located at the opening of the vagina. °· Scar tissue in the vagina from a surgically enlarged opening (episiotomy) or tearing after delivering a baby. °· Vaginal dryness. This is more common in menopause. The normal secretions of the vagina are decreased. Changes in estrogen levels and increased difficulty becoming aroused can cause painful sex. Vaginal dryness can also happen when taking birth control pills. °· Thinning of the tissue (atrophy) of the vulva and vagina. This makes the area thinner, smaller, unable to stretch to accommodate a penis, and prone to infection and tearing. °· Vulvar vestibulitis or vestibulodynia. This is a condition that causes pain involving the area around the entrance to the vagina. The most common cause in young women is birth control pills. Women with low estrogen levels (postmenopausal women) may also experience this. Other causes include allergic reactions, too many nerve endings, skin conditions, and pelvic muscles that cannot relax. °· Vulvar dermatoses. This  includes skin conditions such as lichen sclerosus and lichen planus. °· Lack of foreplay to lubricate the vagina. This can cause vaginal dryness. °· Noncancerous tumors (fibroids) in the uterus. °· Uterus lining tissue growing outside the uterus (endometriosis). °· Pregnancy that starts in the fallopian tube (tubal pregnancy). °· Pregnancy or breastfeeding your baby. This can cause vaginal dryness. °· A tilting or prolapse of the uterus. Prolapse is when weak and stretched muscles around the uterus allow it to fall into the vagina. °· Problems with the ovaries, cysts, or scar tissue. This may be worse with certain sexual positions. °· Previous surgeries causing adhesions or scar tissue in the vagina or pelvis. °· Bladder and intestinal problems. °· Psychological problems (such as depression or anxiety). This may make pain worse. °· Negative attitudes about sex, experiencing rape, sexual assault, and misinformation about sex. These issues are often related to some types of pain. °· Previous pelvic infection, causing scar tissue in the pelvis and on the female organs. °· Cyst or tumor on the ovary. °· Cancer of the female organs. °· Certain medicines. °· Medical problems such as diabetes, arthritis, or thyroid disease. °Female °In men, there are many physical causes of sexual discomfort. Some causes of pain during intercourse are: °· Infections of the prostate, bladder, or seminal vesicles. This can cause pain after ejaculation. °· An inflamed bladder (interstitial cystitis). This may cause pain from ejaculation. °· Gonorrheal infections. This may cause pain during ejaculation. °· An inflamed urethra (urethritis) or inflamed prostate (prostatitis). This can make genital stimulation painful or uncomfortable. °· Deformities of the penis, such as Peyronie's disease. °· A tight foreskin. °· Cancer of the female organs. °·   Psychological problems. This may make pain worse. °DIAGNOSIS  °· Your caregiver will take a history and  have you describe where the pain is located (outside the vagina, in the vagina, in the pelvis). You may be asked when you experience pain, such as with penetration or with thrusting. °· Following this, your caregiver will do a physical exam. Let your caregiver know if the exam is too painful. °· During the final part of the female exam, your caregiver will feel your uterus and ovaries with one hand on the abdomen and one finger in your vagina. This is a pelvic exam. °· Blood tests, a Pap test, cultures for infection, an ultrasound test, and X-rays may be done. You may need to see a specialist for female problems (gynecologist). °· Your caregiver may do a CT scan, MRI, or laparoscopy. Laparoscopy is a procedure to look into the pelvis with a lighted tube, through a cut (incision) in the abdomen. °TREATMENT  °Your caregiver can help you determine the best course of treatment. Sometimes, more testing is done. Continue with the suggested testing until your caregiver feels sure about your diagnosis and how to treat it. Sometimes, it is difficult to find the reason for the pain. The search for the cause and treatment can be frustrating. Treatment often takes several weeks to a few months before you notice any improvement. You may also need to avoid sexual activity until symptoms improve. Continuing to have sex when it hurts can delay healing and actually make the problem worse. °The treatment depends on the cause of the pain. Treatment may include: °· Medicines such as antibiotics, vaginal or skin creams, hormones, or antidepressants. °· Minor or major surgery. °· Psychological counseling or group therapy. °· Kegel exercises and vaginal dilators to help certain cases of vaginismus (spasms). Do this only if recommended by your caregiver. Kegel exercises can make some problems worse. °· Applying lubrication as recommended by your caregiver if you have dryness. °· Sex therapy for you and your sex partner. °It is common for  the pain to continue after the reason for the pain has been treated. Some reasons for this include a conditioned response. This means the person having the pain becomes so familiar with the pain that the pain continues as a response, even though the cause is removed. Sex therapy can help with this problem. °HOME CARE INSTRUCTIONS  °· Follow your caregiver's instructions about taking medicines, tests, counseling, and follow-up treatment. °· Do not use scented tampons, douches, vaginal sprays, or soaps. °· Use water-based lubricants for dryness. Oil lubricants can cause irritation. °· Do not use spermicides or condoms that irritate you. °· Openly discuss with your partner your sexual experience, your desires, foreplay, and different sexual positions for a more comfortable and enjoyable sexual relationship. °· Join group sessions for therapy, if needed. °· Practice safe sex at all times. °· Empty your bladder before having intercourse. °· Try different positions during sexual intercourse. °· Take over-the-counter pain medicine recommended by your caregiver before having sexual intercourse. °· Do not wear pantyhose. Knee-high and thigh-high hose are okay. °· Avoid scrubbing your vulva with a washcloth. Wash the area gently and pat dry with a towel. °SEEK MEDICAL CARE IF:  °· You develop vaginal bleeding after sexual intercourse. °· You develop a lump at the opening of your vagina, even if it is not painful. °· You have abnormal vaginal discharge. °· You have vaginal dryness. °· You have itching or irritation of the vulva or vagina. °· You   develop a rash or reaction to your medicine. °SEEK IMMEDIATE MEDICAL CARE IF:  °· You develop severe abdominal pain during or shortly after sexual intercourse. You could have a ruptured ovarian cyst or ruptured tubal pregnancy. °· You have a fever. °· You have painful or bloody urination. °· You have painful sexual intercourse, and you never had it before. °· You pass out after having  sexual intercourse. °Document Released: 10/10/2007 Document Revised: 12/13/2011 Document Reviewed: 12/21/2010 °ExitCare® Patient Information ©2015 [employer] LLC. This information is not intended to replace advice given to you by your health care provider. Make sure you discuss any questions you have with your health care provider. ° °

## 2014-06-25 LAB — GC/CHLAMYDIA PROBE AMP
CT PROBE, AMP APTIMA: POSITIVE — AB
GC Probe RNA: NEGATIVE

## 2014-08-05 ENCOUNTER — Encounter (HOSPITAL_COMMUNITY): Payer: Self-pay | Admitting: *Deleted

## 2014-09-05 ENCOUNTER — Inpatient Hospital Stay (HOSPITAL_COMMUNITY)
Admission: AD | Admit: 2014-09-05 | Discharge: 2014-09-05 | Disposition: A | Discharge status: Home / Self Care | Payer: BC Managed Care – PPO | Source: Ambulatory Visit | Attending: Obstetrics & Gynecology | Admitting: Obstetrics & Gynecology

## 2014-09-05 ENCOUNTER — Encounter (HOSPITAL_COMMUNITY): Payer: Self-pay | Admitting: *Deleted

## 2014-09-05 DIAGNOSIS — B373 Candidiasis of vulva and vagina: Secondary | ICD-10-CM | POA: Diagnosis not present

## 2014-09-05 DIAGNOSIS — L293 Anogenital pruritus, unspecified: Secondary | ICD-10-CM | POA: Insufficient documentation

## 2014-09-05 DIAGNOSIS — N898 Other specified noninflammatory disorders of vagina: Secondary | ICD-10-CM | POA: Insufficient documentation

## 2014-09-05 DIAGNOSIS — B3731 Acute candidiasis of vulva and vagina: Secondary | ICD-10-CM

## 2014-09-05 HISTORY — DX: Gastro-esophageal reflux disease without esophagitis: K21.9

## 2014-09-05 LAB — WET PREP, GENITAL
Clue Cells Wet Prep HPF POC: NONE SEEN
Trich, Wet Prep: NONE SEEN

## 2014-09-05 LAB — POCT PREGNANCY, URINE: Preg Test, Ur: NEGATIVE

## 2014-09-05 MED ORDER — MICONAZOLE NITRATE 2 % VA CREA: 1.0000 | TOPICAL_CREAM | Freq: Every day | VAGINAL | Status: DC

## 2014-09-05 MED ORDER — FLUCONAZOLE 150 MG PO TABS
ORAL_TABLET | ORAL | Status: AC
  Filled 2014-09-05: qty 1

## 2014-09-05 MED ORDER — FLUCONAZOLE 150 MG PO TABS: 150.0000 mg | ORAL_TABLET | ORAL | Status: DC | PRN

## 2014-09-05 MED ORDER — TRIAMCINOLONE ACETONIDE 0.1 % EX OINT
TOPICAL_OINTMENT | Freq: Once | CUTANEOUS | Status: AC
  Administered 2014-09-05: 23:00:00 via TOPICAL
  Filled 2014-09-05: qty 15

## 2014-09-05 MED ORDER — FLUCONAZOLE 150 MG PO TABS
150.0000 mg | ORAL_TABLET | Freq: Once | ORAL | Status: AC
  Administered 2014-09-05: 150 mg via ORAL

## 2014-09-05 NOTE — MAU Provider Note (Signed)
Chief Complaint: Vaginal Discharge and Vaginal Itching   First Provider Initiated Contact with Patient 09/05/14 2212     SUBJECTIVE HPI: Deanna Myers is a 23 y.o. [phone] female who presents with thick, yellow discharge and severe vulvar and vaginal pruritus 2 weeks. Had MetroGel and tried using it to see if it would help, but symptoms did not improve. [facility] 1 month ago and had STD testing. All normal per patient. Recently ended relationship with partner.   Past Medical History  Diagnosis Date  . BV (bacterial vaginosis)   . Gonorrhea   . Chlamydia   . ADHD (attention deficit hyperactivity disorder)     since childhood  . Hyperemesis     G3  . Asthma     inhaler last used prior to pregnancy  . GERD (gastroesophageal reflux disease)    OB History  Gravida Para Term Preterm AB SAB TAB Ectopic Multiple Living  3 1 1  2 2    1     # Outcome Date GA Lbr Len/2nd Weight Sex Delivery Anes PTL Lv  3 Term 01/16/14 [redacted]w[redacted]d 05:02 / 01:04 2.863 kg (6 lb 5 oz) F Vag-Spont EPI  Y  2 SAB 2013          1 SAB 2011             Past Surgical History  Procedure Laterality Date  . No past surgeries     History   Social History  . Marital Status: Single    Spouse Name: N/A    Number of Children: 0  . Years of Education: N/A   Occupational History  . Cook    Social History Main Topics  . Smoking status: Never Smoker   . Smokeless tobacco: Never Used  . Alcohol Use: No  . Drug Use: No  . Sexual Activity:    Partners: Male    Birth Control/ Protection: None   Other Topics Concern  . Not on file   Social History Narrative   No current facility-administered medications on file prior to encounter.   Current Outpatient Prescriptions on File Prior to Encounter  Medication Sig Dispense Refill  . ibuprofen (ADVIL,MOTRIN) 600 MG tablet Take 1 tablet (600 mg total) by mouth every 6 (six) hours as needed. (Patient taking differently: Take 600 mg by mouth  every 6 (six) hours as needed for mild pain. ) 30 tablet 0  . metroNIDAZOLE (FLAGYL) 500 MG tablet Take 1 tablet (500 mg total) by mouth 2 (two) times daily. (Patient not taking: Reported on 09/05/2014) 14 tablet 0   Allergies  Allergen Reactions  . Shellfish Allergy Anaphylaxis  . Adhesive [Tape] Swelling and Other (See Comments)    Reaction:  Bruising   . Betadine [Povidone Iodine] Rash    ROS: Pertinent positive items in HPI. Negative for fever, chills, abdominal pain, vaginal odor, intermenstrual bleeding, dyspareunia.  OBJECTIVE Blood pressure 114/75, pulse 80, temperature 97.8 F (36.6 C), temperature source Oral, resp. rate 16, height 5\' 6"  (1.676 m), weight 99.791 kg (220 lb), SpO2 100 %, currently breastfeeding. GENERAL: Well-developed, well-nourished female in no acute distress.  HEENT: Normocephalic HEART: normal rate RESP: normal effort ABDOMEN: Soft, non-tender EXTREMITIES: Nontender, no edema NEURO: Alert and oriented SPECULUM EXAM: NEFG except for significant erythema and excoriation of the vulva. No lesions, large amount of yellowish-white, odorless, curd-like discharge. Vaginal walls erythematous. No lesions. no blood noted, cervix clean BIMANUAL: Deferred due to patient discomfort and absence of  abdominal pain.  LAB RESULTS Results for orders placed or performed during the hospital encounter of 09/05/14 (from the past 24 hour(s))  Wet prep, genital     Status: Abnormal   Collection Time: 09/05/14 10:20 PM  Result Value Ref Range   Yeast Wet Prep HPF POC MODERATE (A) NONE SEEN   Trich, Wet Prep NONE SEEN NONE SEEN   Clue Cells Wet Prep HPF POC NONE SEEN NONE SEEN   WBC, Wet Prep HPF POC MANY (A) NONE SEEN  Pregnancy, urine POC     Status: None   Collection Time: 09/05/14 10:30 PM  Result Value Ref Range   Preg Test, Ur NEGATIVE NEGATIVE    IMAGING No results found.  MAU COURSE Diflucan and triamcinolone given in MAU.  ASSESSMENT 1. Vulvovaginal  candidiasis     PLAN Discharge home in stable condition.     Follow-up Information    [facility] [place]   Why:  As needed if symptoms worsen   Contact information:   [address] [place] [id number] [phone]      [facility] with [facility].   Why:  As needed in emergencies   Contact information:   [address] [id number] [address] [place] [phone]       Medication List    STOP taking these medications        metroNIDAZOLE 500 MG tablet  Commonly known as:  FLAGYL      TAKE these medications        fluconazole 150 MG tablet  Commonly known as:  DIFLUCAN  Take 1 tablet (150 mg total) by mouth every 3 (three) days as needed.     ibuprofen 600 MG tablet  Commonly known as:  ADVIL,MOTRIN  Take 1 tablet (600 mg total) by mouth every 6 (six) hours as needed.     miconazole 2 % vaginal cream  Commonly known as:  MONISTAT 7  Place 1 Applicatorful vaginally at bedtime.       [place] Oliviana Myers, CNM 09/05/2014  10:55 PM

## 2014-09-05 NOTE — Discharge Instructions (Signed)
Gynecology Providers [facility]    [facility]  [phone]     Phone: [phone]          [facility]                      [facility]  @[place]     Phone: [phone]  Phone: [phone]         [facility]     Phone: [phone]  Phone: [phone]           [facility] [place]                hone: [phone]  Phone: [phone]         [facility] [address] Francoise Ce[facility]ard [facility]      Phone: [phone]  Phone: [phone]         [facility]                Phone: [phone]  [facility]   [address][phone]    [facility]([place])          Phone: [phone] [facility]   Phone: [phone]   Candidal Vulvovaginitis Candidal vulvovaginitis is an infection of the vagina and vulva. The vulva is the skin around the opening of the vagina. This may cause itching and discomfort in and around the vagina.  HOME CARE  Only take medicine as told by your doctor.  Do not have sex (intercourse) until the infection is healed or as told by your doctor.  Practice safe sex.  Tell your sex partner about your infection.  Do not douche or use tampons.  Wear cotton underwear. Do not wear tight pants or panty hose.  Eat yogurt. This may help treat and prevent yeast infections. GET HELP RIGHT AWAY IF:   You have a fever.  Your problems get worse during treatment or do not get better in 3 days.  You have discomfort, irritation, or itching in your vagina or vulva area.  You have pain after sex.  You start to get belly (abdominal) pain. MAKE SURE YOU:  Understand these instructions.  Will watch your condition.  Will get help right away if you are not doing well or get worse. Document Released: 12/17/2008 Document Revised: 09/25/2013 Document Reviewed:  12/17/2008 [facility] Patient Information 2015 [employer] [place]. This information is not intended to replace advice given to you by your health care provider. Make sure you discuss any questions you have with your health care provider.

## 2014-09-05 NOTE — MAU Note (Signed)
Pt reports she was tested for STD's at the health dept one month ago, states at that time she was using Metrogel and was having a thick clumpy discharge. States now she has a yellow clumpy discharge and vaginal itching. States all the tests at the health dept were negative.

## 2014-09-06 LAB — GC/CHLAMYDIA PROBE AMP
CT Probe RNA: NEGATIVE
GC Probe RNA: NEGATIVE

## 2014-12-21 ENCOUNTER — Inpatient Hospital Stay (HOSPITAL_COMMUNITY)
Admission: AD | Admit: 2014-12-21 | Discharge: 2014-12-21 | Disposition: A | Discharge status: Home / Self Care | Payer: Self-pay | Source: Ambulatory Visit | Attending: Obstetrics & Gynecology | Admitting: Obstetrics & Gynecology

## 2014-12-21 ENCOUNTER — Encounter (HOSPITAL_COMMUNITY): Payer: Self-pay | Admitting: *Deleted

## 2014-12-21 DIAGNOSIS — R1030 Lower abdominal pain, unspecified: Secondary | ICD-10-CM | POA: Insufficient documentation

## 2014-12-21 DIAGNOSIS — A499 Bacterial infection, unspecified: Secondary | ICD-10-CM

## 2014-12-21 DIAGNOSIS — N760 Acute vaginitis: Secondary | ICD-10-CM | POA: Insufficient documentation

## 2014-12-21 DIAGNOSIS — B9689 Other specified bacterial agents as the cause of diseases classified elsewhere: Secondary | ICD-10-CM | POA: Insufficient documentation

## 2014-12-21 LAB — WET PREP, GENITAL
Trich, Wet Prep: NONE SEEN
WBC WET PREP: NONE SEEN
Yeast Wet Prep HPF POC: NONE SEEN

## 2014-12-21 LAB — URINALYSIS, ROUTINE W REFLEX MICROSCOPIC
BILIRUBIN URINE: NEGATIVE
GLUCOSE, UA: NEGATIVE mg/dL
HGB URINE DIPSTICK: NEGATIVE
KETONES UR: NEGATIVE mg/dL
Leukocytes, UA: NEGATIVE
Nitrite: NEGATIVE
PROTEIN: NEGATIVE mg/dL
Specific Gravity, Urine: >1.03 — ABNORMAL HIGH (ref 1.005–1.030)
Urobilinogen, UA: 0.2 mg/dL (ref 0.0–1.0)
pH: 5.5 (ref 5.0–8.0)

## 2014-12-21 LAB — POCT PREGNANCY, URINE: Preg Test, Ur: NEGATIVE

## 2014-12-21 MED ORDER — METRONIDAZOLE 500 MG PO TABS: 500.0000 mg | ORAL_TABLET | Freq: Two times a day (BID) | ORAL | Status: DC

## 2014-12-21 NOTE — MAU Note (Signed)
Lower abdominal pain since this AM. Thick white vaginal D/C with odor X 2 days.

## 2014-12-21 NOTE — MAU Provider Note (Signed)
History     CSN: [id number]  Arrival date and time: 12/21/14 1658   None     Chief Complaint  Patient presents with  . Abdominal Pain  . Vaginal Discharge   HPIpt is not pregnant and c/o thick white vaginal discharge with odor.  Pt is breast feeding 23 yo daughter and using condoms with spermicide; pt had a condom failure and is concerned about infections.  Arvil Persons, RN Registered Nurse Signed Obstetrics/Gynecology MAU Note 12/21/2014 5:26 PM    Expand All Collapse All   Lower abdominal pain since this AM. Thick white vaginal D/C with odor X 2 days.         Past Medical History  Diagnosis Date  . BV (bacterial vaginosis)   . Gonorrhea   . Chlamydia   . ADHD (attention deficit hyperactivity disorder)     since childhood  . Hyperemesis     G3  . Asthma     inhaler last used prior to pregnancy  . GERD (gastroesophageal reflux disease)     Past Surgical History  Procedure Laterality Date  . No past surgeries      Family History  Problem Relation Age of Onset  . Cervical cancer Paternal Aunt   . Hypertension Father   . Congestive Heart Failure Maternal Grandmother   . Hypertension Mother     History  Substance Use Topics  . Smoking status: Never Smoker   . Smokeless tobacco: Never Used  . Alcohol Use: No    Allergies:  Allergies  Allergen Reactions  . Shellfish Allergy Anaphylaxis  . Adhesive [Tape] Swelling and Other (See Comments)    Reaction:  Bruising   . Betadine [Povidone Iodine] Rash    Prescriptions prior to admission  Medication Sig Dispense Refill Last Dose  . acetaminophen (TYLENOL) 325 MG tablet Take 325 mg by mouth every 6 (six) hours as needed for headache.   Past Week at Unknown time  . fluconazole (DIFLUCAN) 150 MG tablet Take 1 tablet (150 mg total) by mouth every 3 (three) days as needed. (Patient not taking: Reported on 12/21/2014) 2 tablet 0   . ibuprofen (ADVIL,MOTRIN) 600 MG tablet Take 1 tablet (600 mg total) by mouth  every 6 (six) hours as needed. (Patient not taking: Reported on 12/21/2014) 30 tablet 0 Past Month at Unknown time  . miconazole (MONISTAT 7) 2 % vaginal cream Place 1 Applicatorful vaginally at bedtime. (Patient not taking: Reported on 12/21/2014) 45 g 0     Review of Systems  Constitutional: Negative for fever and chills.  Gastrointestinal: Positive for abdominal pain. Negative for nausea, vomiting, diarrhea and constipation.  Genitourinary: Negative for dysuria.   Physical Exam   Blood pressure 122/65, pulse 84, temperature 98.1 F (36.7 C), temperature source Oral, resp. rate 18, last menstrual period 12/05/2014, currently breastfeeding.  Physical Exam  Nursing note and vitals reviewed. Constitutional: She is oriented to person, place, and time. She appears well-developed and well-nourished. No distress.  HENT:  Head: Normocephalic.  Eyes: Pupils are equal, round, and reactive to light.  Neck: Normal range of motion. Neck supple.  Cardiovascular: Normal rate.   Respiratory: Effort normal.  GI: Soft. She exhibits no distension. There is no tenderness. There is no rebound.  Genitourinary:  Mod amount of white watery discharge in vault; cervix clean, NT; uterus NSSC NT adnexa without palpable enlargement or tenderness Exam limited by habitus  Musculoskeletal: Normal range of motion.  Neurological: She is alert and oriented to person,  place, and time.  Skin: Skin is warm and dry.  Psychiatric: She has a normal mood and affect.    MAU Course  Procedures Results for orders placed or performed during the hospital encounter of 12/21/14 (from the past 24 hour(s))  Urinalysis, Routine w reflex microscopic     Status: Abnormal   Collection Time: 12/21/14  5:15 PM  Result Value Ref Range   Color, Urine YELLOW YELLOW   APPearance CLEAR CLEAR   Specific Gravity, Urine >1.030 (H) 1.005 - 1.030   pH 5.5 5.0 - 8.0   Glucose, UA NEGATIVE NEGATIVE mg/dL   Hgb urine dipstick NEGATIVE  NEGATIVE   Bilirubin Urine NEGATIVE NEGATIVE   Ketones, ur NEGATIVE NEGATIVE mg/dL   Protein, ur NEGATIVE NEGATIVE mg/dL   Urobilinogen, UA 0.2 0.0 - 1.0 mg/dL   Nitrite NEGATIVE NEGATIVE   Leukocytes, UA NEGATIVE NEGATIVE  Pregnancy, urine POC     Status: None   Collection Time: 12/21/14  5:19 PM  Result Value Ref Range   Preg Test, Ur NEGATIVE NEGATIVE  Wet prep, genital     Status: Abnormal   Collection Time: 12/21/14  5:57 PM  Result Value Ref Range   Yeast Wet Prep HPF POC NONE SEEN NONE SEEN   Trich, Wet Prep NONE SEEN NONE SEEN   Clue Cells Wet Prep HPF POC FEW (A) NONE SEEN   WBC, Wet Prep HPF POC NONE SEEN NONE SEEN      Assessment and Plan  Bacterial vaginosis- Flagyl 500mg  #14 Contraceptive choices information given  Daxter Paule 12/21/2014, 5:52 PM

## 2014-12-21 NOTE — Discharge Instructions (Signed)
Bacterial Vaginosis Bacterial vaginosis is a vaginal infection that occurs when the normal balance of bacteria in the vagina is disrupted. It results from an overgrowth of certain bacteria. This is the most common vaginal infection in women of childbearing age. Treatment is important to prevent complications, especially in pregnant women, as it can cause a premature delivery. CAUSES  Bacterial vaginosis is caused by an increase in harmful bacteria that are normally present in smaller amounts in the vagina. Several different kinds of bacteria can cause bacterial vaginosis. However, the reason that the condition develops is not fully understood. RISK FACTORS Certain activities or behaviors can put you at an increased risk of developing bacterial vaginosis, including:  Having a new sex partner or multiple sex partners.  Douching.  Using an intrauterine device (IUD) for contraception. Women do not get bacterial vaginosis from toilet seats, bedding, swimming pools, or contact with objects around them. SIGNS AND SYMPTOMS  Some women with bacterial vaginosis have no signs or symptoms. Common symptoms include:  Grey vaginal discharge.  A fishlike odor with discharge, especially after sexual intercourse.  Itching or burning of the vagina and vulva.  Burning or pain with urination. DIAGNOSIS  Your health care provider will take a medical history and examine the vagina for signs of bacterial vaginosis. A sample of vaginal fluid may be taken. Your health care provider will look at this sample under a microscope to check for bacteria and abnormal cells. A vaginal pH test may also be done.  TREATMENT  Bacterial vaginosis may be treated with antibiotic medicines. These may be given in the form of a pill or a vaginal cream. A second round of antibiotics may be prescribed if the condition comes back after treatment.  HOME CARE INSTRUCTIONS   Only take over-the-counter or prescription medicines as  directed by your health care provider.  If antibiotic medicine was prescribed, take it as directed. Make sure you finish it even if you start to feel better.  Do not have sex until treatment is completed.  Tell all sexual partners that you have a vaginal infection. They should see their health care provider and be treated if they have problems, such as a mild rash or itching.  Practice safe sex by using condoms and only having one sex partner. SEEK MEDICAL CARE IF:   Your symptoms are not improving after 3 days of treatment.  You have increased discharge or pain.  You have a fever. MAKE SURE YOU:   Understand these instructions.  Will watch your condition.  Will get help right away if you are not doing well or get worse. FOR MORE INFORMATION  Centers for Disease Control and Prevention, Division of STD Prevention: www.cdc.gov/std American Sexual Health Association (ASHA): www.ashastd.org  Document Released: 09/20/2005 Document Revised: 07/11/2013 Document Reviewed: 05/02/2013 Exit[employer] Patient Information 2015 [employer]. This information is not intended to replace advice given to you by your health care provider. Make sure you discuss any questions you have with your health care provider.  

## 2014-12-22 LAB — HIV ANTIBODY (ROUTINE TESTING W REFLEX): HIV SCREEN 4TH GENERATION: NONREACTIVE

## 2014-12-22 LAB — RPR: RPR: NONREACTIVE

## 2014-12-23 LAB — GC/CHLAMYDIA PROBE AMP (~~LOC~~) NOT AT ARMC
Chlamydia: NEGATIVE
NEISSERIA GONORRHEA: NEGATIVE

## 2015-02-18 ENCOUNTER — Encounter (HOSPITAL_COMMUNITY): Payer: Self-pay

## 2015-02-18 ENCOUNTER — Inpatient Hospital Stay (HOSPITAL_COMMUNITY): Payer: Self-pay

## 2015-02-18 ENCOUNTER — Inpatient Hospital Stay (HOSPITAL_COMMUNITY)
Admission: AD | Admit: 2015-02-18 | Discharge: 2015-02-18 | Disposition: A | Discharge status: Home / Self Care | Payer: Self-pay | Source: Ambulatory Visit | Attending: Obstetrics | Admitting: Obstetrics

## 2015-02-18 DIAGNOSIS — Z3A01 Less than 8 weeks gestation of pregnancy: Secondary | ICD-10-CM | POA: Insufficient documentation

## 2015-02-18 DIAGNOSIS — O26899 Other specified pregnancy related conditions, unspecified trimester: Secondary | ICD-10-CM

## 2015-02-18 DIAGNOSIS — O210 Mild hyperemesis gravidarum: Secondary | ICD-10-CM | POA: Insufficient documentation

## 2015-02-18 DIAGNOSIS — O208 Other hemorrhage in early pregnancy: Secondary | ICD-10-CM | POA: Insufficient documentation

## 2015-02-18 DIAGNOSIS — O219 Vomiting of pregnancy, unspecified: Secondary | ICD-10-CM

## 2015-02-18 DIAGNOSIS — R109 Unspecified abdominal pain: Secondary | ICD-10-CM

## 2015-02-18 LAB — COMPREHENSIVE METABOLIC PANEL
ALBUMIN: 3.6 g/dL (ref 3.5–5.0)
ALT: 11 U/L — AB (ref 14–54)
AST: 19 U/L (ref 15–41)
Alkaline Phosphatase: 49 U/L (ref 38–126)
Anion gap: 7 (ref 5–15)
BUN: 6 mg/dL (ref 6–20)
CHLORIDE: 103 mmol/L (ref 101–111)
CO2: 25 mmol/L (ref 22–32)
CREATININE: 0.49 mg/dL (ref 0.44–1.00)
Calcium: 8.8 mg/dL — ABNORMAL LOW (ref 8.9–10.3)
GFR calc Af Amer: >60 mL/min (ref >60)
Glucose, Bld: 147 mg/dL — ABNORMAL HIGH (ref 65–99)
Potassium: 3.2 mmol/L — ABNORMAL LOW (ref 3.5–5.1)
SODIUM: 135 mmol/L (ref 135–145)
Total Bilirubin: 0.3 mg/dL (ref 0.3–1.2)
Total Protein: 6.9 g/dL (ref 6.5–8.1)

## 2015-02-18 LAB — CBC
HCT: 34.6 % — ABNORMAL LOW (ref 36.0–46.0)
Hemoglobin: 11.6 g/dL — ABNORMAL LOW (ref 12.0–15.0)
MCH: 27.2 pg (ref 26.0–34.0)
MCHC: 33.5 g/dL (ref 30.0–36.0)
MCV: 81.2 fL (ref 78.0–100.0)
PLATELETS: 271 10*3/uL (ref 150–400)
RBC: 4.26 MIL/uL (ref 3.87–5.11)
RDW: 13.6 % (ref 11.5–15.5)
WBC: 6.2 10*3/uL (ref 4.0–10.5)

## 2015-02-18 LAB — HCG, QUANTITATIVE, PREGNANCY: hCG, Beta Chain, Quant, S: 51580 m[IU]/mL — ABNORMAL HIGH (ref <5)

## 2015-02-18 MED ORDER — METOCLOPRAMIDE HCL 10 MG PO TABS: 10.0000 mg | ORAL_TABLET | Freq: Three times a day (TID) | ORAL | Status: DC

## 2015-02-18 MED ORDER — PRENATAL VITAMINS 0.8 MG PO TABS: 1.0000 | ORAL_TABLET | Freq: Every day | ORAL | Status: DC

## 2015-02-18 MED ORDER — SODIUM CHLORIDE 0.9 % IV SOLN: 12.5000 mg | Freq: Once | INTRAVENOUS | Status: DC

## 2015-02-18 MED ORDER — DEXTROSE IN LACTATED RINGERS 5 % IV SOLN
Freq: Once | INTRAVENOUS | Status: AC
  Administered 2015-02-18: 15:00:00 via INTRAVENOUS
  Filled 2015-02-18: qty 10

## 2015-02-18 MED ORDER — SODIUM CHLORIDE 0.9 % IV SOLN
12.5000 mg | Freq: Once | INTRAVENOUS | Status: AC
  Administered 2015-02-18: 12.5 mg via INTRAVENOUS
  Filled 2015-02-18: qty 0.5

## 2015-02-18 MED ORDER — METOCLOPRAMIDE HCL 10 MG PO TABS
10.0000 mg | ORAL_TABLET | Freq: Three times a day (TID) | ORAL | Status: DC
Start: 2015-02-18 — End: 2015-02-18

## 2015-02-18 MED ORDER — PROMETHAZINE HCL 25 MG/ML IJ SOLN
12.5000 mg | Freq: Once | INTRAMUSCULAR | Status: AC
  Administered 2015-02-18: 12.5 mg via INTRAVENOUS
  Filled 2015-02-18: qty 1

## 2015-02-18 NOTE — MAU Note (Signed)
Onset on Sunday of not being able to keep anything down, spitting.

## 2015-02-18 NOTE — MAU Provider Note (Signed)
History     CSN: [id number]  Arrival date and time: 02/18/15 1231   First Provider Initiated Contact with Patient 02/18/15 1322      Chief Complaint  Patient presents with  . Emesis During Pregnancy   HPI [name] is a 24 y.o. G3P1011 at 264w1d here with report of nausea and vomiting that began on Sunday.  Unable to hold anything down.  Multiple episodes of vomiting in past 24 hours.  Also reports midpelvic pain that radiates down toward the groin.  No report of abnormal vaginal discharge or bleeding.     Past Medical History  Diagnosis Date  . BV (bacterial vaginosis)   . Gonorrhea   . Chlamydia   . ADHD (attention deficit hyperactivity disorder)     since childhood  . Hyperemesis     G3  . Asthma     inhaler last used prior to pregnancy  . GERD (gastroesophageal reflux disease)     Past Surgical History  Procedure Laterality Date  . No past surgeries      Family History  Problem Relation Age of Onset  . Cervical cancer Paternal Aunt   . Hypertension Father   . Congestive Heart Failure Maternal Grandmother   . Hypertension Mother     History  Substance Use Topics  . Smoking status: Never Smoker   . Smokeless tobacco: Never Used  . Alcohol Use: No    Allergies:  Allergies  Allergen Reactions  . Shellfish Allergy Anaphylaxis  . Adhesive [Tape] Swelling and Other (See Comments)    Reaction:  Bruising   . Betadine [Povidone Iodine] Rash    Prescriptions prior to admission  Medication Sig Dispense Refill Last Dose  . acetaminophen (TYLENOL) 500 MG tablet Take 500-1,000 mg by mouth every 6 (six) hours as needed for headache (depends on pain level if [phone] mg).   Past Week at Unknown time  . DiphenhydrAMINE HCl (BENADRYL PO) Take 1 tablet by mouth daily as needed (allergies).   Past Week at Unknown time  . ibuprofen (ADVIL,MOTRIN) 600 MG tablet Take 1 tablet (600 mg total) by mouth every 6 (six) hours as needed. (Patient not taking: Reported on  12/21/2014) 30 tablet 0 Past Month at Unknown time  . metroNIDAZOLE (FLAGYL) 500 MG tablet Take 1 tablet (500 mg total) by mouth 2 (two) times daily. (Patient not taking: Reported on 02/18/2015) 14 tablet 0     Review of Systems  Constitutional: Positive for malaise/fatigue. Negative for fever and chills.  Gastrointestinal: Positive for nausea, vomiting and abdominal pain. Negative for diarrhea and constipation.  Genitourinary: Negative for dysuria, urgency and frequency.  Neurological: Positive for dizziness.  All other systems reviewed and are negative.  Physical Exam   Blood pressure 127/69, pulse 94, temperature 98.5 F (36.9 C), resp. rate 16, last menstrual period 01/06/2015, currently breastfeeding.  Physical Exam  Constitutional: She is oriented to person, place, and time. She appears well-developed and well-nourished. No distress.  Appears fatigued  HENT:  Head: Normocephalic.  Mouth/Throat: Mucous membranes are dry.  Neck: Normal range of motion. Neck supple.  Cardiovascular: Normal rate, regular rhythm and normal heart sounds.   Respiratory: Effort normal and breath sounds normal.  GI: Soft. There is tenderness (midpelvic).  Genitourinary: No bleeding in the vagina.  Neurological: She is alert and oriented to person, place, and time. She has normal reflexes.  Skin: Skin is warm and dry. She is not diaphoretic.    MAU Course  Procedures Results  for orders placed or performed during the hospital encounter of 02/18/15 (from the past 24 hour(s))  hCG, quantitative, pregnancy     Status: Abnormal   Collection Time: 02/18/15  5:00 PM  Result Value Ref Range   hCG, Beta Chain, Quant, S 51580 (H) <5 mIU/mL  CBC     Status: Abnormal   Collection Time: 02/18/15  5:00 PM  Result Value Ref Range   WBC 6.2 4.0 - 10.5 K/uL   RBC 4.26 3.87 - 5.11 MIL/uL   Hemoglobin 11.6 (L) 12.0 - 15.0 g/dL   HCT 16.134.6 (L) 09.636.0 - 04.546.0 %   MCV 81.2 78.0 - 100.0 fL   MCH 27.2 26.0 - 34.0 pg    MCHC 33.5 30.0 - 36.0 g/dL   RDW 40.913.6 81.111.5 - 91.415.5 %   Platelets 271 150 - 400 K/uL  Comprehensive metabolic panel     Status: Abnormal   Collection Time: 02/18/15  5:00 PM  Result Value Ref Range   Sodium 135 135 - 145 mmol/L   Potassium 3.2 (L) 3.5 - 5.1 mmol/L   Chloride 103 101 - 111 mmol/L   CO2 25 22 - 32 mmol/L   Glucose, Bld 147 (H) 65 - 99 mg/dL   BUN 6 6 - 20 mg/dL   Creatinine, Ser 7.820.49 0.44 - 1.00 mg/dL   Calcium 8.8 (L) 8.9 - 10.3 mg/dL   Total Protein 6.9 6.5 - 8.1 g/dL   Albumin 3.6 3.5 - 5.0 g/dL   AST 19 15 - 41 U/L   ALT 11 (L) 14 - 54 U/L   Alkaline Phosphatase 49 38 - 126 U/L   Total Bilirubin 0.3 0.3 - 1.2 mg/dL   GFR calc non Af Amer >60 >60 mL/min   GFR calc Af Amer >60 >60 mL/min   Anion gap 7 5 - 15    Ultrasound: FINDINGS: Intrauterine gestational sac: Single  Yolk sac: Yes  Embryo: Yes  Cardiac Activity: Yes  Heart Rate: 113 bpm  MSD: mm w d  CRL: 5.5 mm 6 w 3 d US EDC: 10/11/2015  Maternal uterus/adnexae:  Subchorionic hemorrhage: Small subchorionic hemorrhage noted.  Right ovary: Normal  Left ovary: Norma  Other :None  Free fluid: No  IMPRESSION: 1. Single living intrauterine gestation. The estimated gestational age is 6 weeks and 3 days. 2. Small subchorionic hemorrhage.  1700 Pt reports improvement in nausea after receiving 1 L of D5LR w/multivitamin and 12.5 mg phenergan; requests additional fluids and meds  Assessment and Plan  23 y.o. G3P1011 at 3854w1d IUP  Small Subchorionic Hemorrhage Nausea and Vomiting in Pregnancy  Plan: Discharge to home RX Reglan 10 mg TID Schedule prenatal care appointment  Marlis EdelsonWalidah N Karim, CNM

## 2015-02-18 NOTE — Progress Notes (Signed)
Patient unable to urinate, voided prior to leaving her residence.  Her mother states that she was sick "like this" throughout her entire pregnancy.

## 2015-04-24 ENCOUNTER — Encounter (HOSPITAL_COMMUNITY): Payer: Self-pay | Admitting: *Deleted

## 2015-04-24 ENCOUNTER — Inpatient Hospital Stay (HOSPITAL_COMMUNITY)
Admission: AD | Admit: 2015-04-24 | Discharge: 2015-04-24 | Discharge status: Against Medical Advice | Payer: Medicaid Other | Source: Ambulatory Visit | Attending: Family Medicine | Admitting: Family Medicine

## 2015-04-24 DIAGNOSIS — Z3201 Encounter for pregnancy test, result positive: Secondary | ICD-10-CM | POA: Insufficient documentation

## 2015-04-24 LAB — POCT PREGNANCY, URINE: Preg Test, Ur: POSITIVE — AB

## 2015-04-24 NOTE — MAU Note (Signed)
Pt wants to know how far along she is and if baby is ok. Had a miscarriage 2 months ago.

## 2015-04-25 ENCOUNTER — Encounter (HOSPITAL_COMMUNITY): Payer: Self-pay | Admitting: *Deleted

## 2015-04-25 ENCOUNTER — Inpatient Hospital Stay (HOSPITAL_COMMUNITY): Payer: Medicaid Other

## 2015-04-25 ENCOUNTER — Inpatient Hospital Stay (HOSPITAL_COMMUNITY)
Admission: AD | Admit: 2015-04-25 | Discharge: 2015-04-25 | Disposition: A | Discharge status: Home / Self Care | Payer: Self-pay | Source: Ambulatory Visit | Attending: Obstetrics | Admitting: Obstetrics

## 2015-04-25 DIAGNOSIS — Z3A01 Less than 8 weeks gestation of pregnancy: Secondary | ICD-10-CM | POA: Insufficient documentation

## 2015-04-25 DIAGNOSIS — O26899 Other specified pregnancy related conditions, unspecified trimester: Secondary | ICD-10-CM

## 2015-04-25 DIAGNOSIS — O9989 Other specified diseases and conditions complicating pregnancy, childbirth and the puerperium: Secondary | ICD-10-CM | POA: Insufficient documentation

## 2015-04-25 DIAGNOSIS — R109 Unspecified abdominal pain: Secondary | ICD-10-CM | POA: Insufficient documentation

## 2015-04-25 LAB — CBC
HEMATOCRIT: 33.6 % — AB (ref 36.0–46.0)
Hemoglobin: 11.2 g/dL — ABNORMAL LOW (ref 12.0–15.0)
MCH: 27 pg (ref 26.0–34.0)
MCHC: 33.3 g/dL (ref 30.0–36.0)
MCV: 81 fL (ref 78.0–100.0)
PLATELETS: 301 10*3/uL (ref 150–400)
RBC: 4.15 MIL/uL (ref 3.87–5.11)
RDW: 14.5 % (ref 11.5–15.5)
WBC: 6.4 10*3/uL (ref 4.0–10.5)

## 2015-04-25 LAB — URINALYSIS, ROUTINE W REFLEX MICROSCOPIC
BILIRUBIN URINE: NEGATIVE
GLUCOSE, UA: NEGATIVE mg/dL
Hgb urine dipstick: NEGATIVE
Ketones, ur: NEGATIVE mg/dL
Leukocytes, UA: NEGATIVE
Nitrite: NEGATIVE
Protein, ur: NEGATIVE mg/dL
Specific Gravity, Urine: >1.03 — ABNORMAL HIGH (ref 1.005–1.030)
UROBILINOGEN UA: 0.2 mg/dL (ref 0.0–1.0)
pH: 5.5 (ref 5.0–8.0)

## 2015-04-25 LAB — HCG, QUANTITATIVE, PREGNANCY: hCG, Beta Chain, Quant, S: 179 m[IU]/mL — ABNORMAL HIGH (ref <5)

## 2015-04-25 LAB — WET PREP, GENITAL
CLUE CELLS WET PREP: NONE SEEN
TRICH WET PREP: NONE SEEN
Yeast Wet Prep HPF POC: NONE SEEN

## 2015-04-25 NOTE — MAU Note (Addendum)
Patient presents at [redacted] weeks gestation for an ultrasound and proof of pregnancy letter. Denies bleeding but states she has a discharge. States she had the discharge yesterday but left before being seen.;

## 2015-04-25 NOTE — MAU Provider Note (Signed)
History     CSN: [id number]  Arrival date and time: 04/25/15 1613   None     Chief Complaint  Patient presents with  . Pregnancy Ultrasound   HPI  Pt is ?[redacted]w[redacted]d pregnant with unsure LMP G3P1011 who presents with white thick creamy discharge x 2 weeks Pt also c/o of intermittent abd cramping bilaterally. Pt has also had some pain with IC- last IC was 4 days ago Pt denies constipation, diarrhea, fever/chills, N/V and UTI sx. RN note:  MAU Note 04/25/2015 4:44 PM    Expand All Collapse All   Patient presents at [redacted] weeks gestation for an ultrasound and proof of pregnancy letter. Denies bleeding but states she has a discharge. States she had the discharge yesterday but left before being seen.;       Past Medical History  Diagnosis Date  . BV (bacterial vaginosis)   . Gonorrhea   . Chlamydia   . ADHD (attention deficit hyperactivity disorder)     since childhood  . Hyperemesis     G3  . Asthma     inhaler last used prior to pregnancy  . GERD (gastroesophageal reflux disease)     Past Surgical History  Procedure Laterality Date  . No past surgeries      Family History  Problem Relation Age of Onset  . Cervical cancer Paternal Aunt   . Hypertension Father   . Congestive Heart Failure Maternal Grandmother   . Hypertension Mother     History  Substance Use Topics  . Smoking status: Never Smoker   . Smokeless tobacco: Never Used  . Alcohol Use: Yes     Comment: not while pregnant     Allergies:  Allergies  Allergen Reactions  . Shellfish Allergy Anaphylaxis  . Adhesive [Tape] Swelling and Other (See Comments)    Reaction:  Bruising   . Latex Hives  . Betadine [Povidone Iodine] Rash    Prescriptions prior to admission  Medication Sig Dispense Refill Last Dose  . acetaminophen (TYLENOL) 500 MG tablet Take 500-1,000 mg by mouth every 6 (six) hours as needed for headache (depends on pain level if [phone] mg).   Past Week at Unknown time  . DiphenhydrAMINE HCl  (BENADRYL PO) Take 1 tablet by mouth daily as needed (allergies).   Past Week at Unknown time  . metoCLOPramide (REGLAN) 10 MG tablet Take 1 tablet (10 mg total) by mouth 3 (three) times daily with meals. 180 tablet 0   . Prenatal Multivit-Min-Fe-FA (PRENATAL VITAMINS) 0.8 MG tablet Take 1 tablet by mouth daily. 30 tablet 12     Review of Systems  Constitutional: Negative for fever and chills.  Gastrointestinal: Positive for abdominal pain. Negative for nausea, vomiting, diarrhea and constipation.  Genitourinary: Negative for dysuria and urgency.       White thick creamy discharge  Musculoskeletal:       No edema  Neurological: Negative for headaches.   Physical Exam   Blood pressure 98/76, pulse 78, temperature 98.2 F (36.8 C), temperature source Oral, resp. rate 16, height  (1.676 m), weight 217 lb 2 oz (98.487 kg), last menstrual period 03/06/2015, unknown if currently breastfeeding.  Physical Exam  Nursing note and vitals reviewed. Constitutional: She is oriented to person, place, and time. She appears well-developed and well-nourished. No distress.  HENT:  Head: Normocephalic.  Eyes: Pupils are equal, round, and reactive to light.  Neck: Normal range of motion. Neck supple.  Cardiovascular: Normal rate, regular rhythm and normal  heart sounds.   Respiratory: Effort normal and breath sounds normal. No respiratory distress. She has no wheezes. She has no rales.  GI: Soft. She exhibits no distension. There is tenderness. There is no rebound and no guarding.  Diffuse lower abdominal tenderness with palpation-no rebound  Musculoskeletal: Normal range of motion.  Neurological: She is alert and oriented to person, place, and time.  Skin: Skin is warm and dry.  Psychiatric: She has a normal mood and affect.    MAU Course  Procedures Results for orders placed or performed during the hospital encounter of 04/25/15 (from the past 24 hour(s))  Urinalysis, Routine w reflex  microscopic (not at [facility])     Status: Abnormal   Collection Time: 04/25/15  4:49 PM  Result Value Ref Range   Color, Urine YELLOW YELLOW   APPearance CLEAR CLEAR   Specific Gravity, Urine >1.030 (H) 1.005 - 1.030   pH 5.5 5.0 - 8.0   Glucose, UA NEGATIVE NEGATIVE mg/dL   Hgb urine dipstick NEGATIVE NEGATIVE   Bilirubin Urine NEGATIVE NEGATIVE   Ketones, ur NEGATIVE NEGATIVE mg/dL   Protein, ur NEGATIVE NEGATIVE mg/dL   Urobilinogen, UA 0.2 0.0 - 1.0 mg/dL   Nitrite NEGATIVE NEGATIVE   Leukocytes, UA NEGATIVE NEGATIVE  Wet prep, genital     Status: Abnormal   Collection Time: 04/25/15  6:50 PM  Result Value Ref Range   Yeast Wet Prep HPF POC NONE SEEN NONE SEEN   Trich, Wet Prep NONE SEEN NONE SEEN   Clue Cells Wet Prep HPF POC NONE SEEN NONE SEEN   WBC, Wet Prep HPF POC FEW (A) NONE SEEN  CBC     Status: Abnormal   Collection Time: 04/25/15  7:15 PM  Result Value Ref Range   WBC 6.4 4.0 - 10.5 K/uL   RBC 4.15 3.87 - 5.11 MIL/uL   Hemoglobin 11.2 (L) 12.0 - 15.0 g/dL   HCT 16.1 (L) 09.6 - 04.5 %   MCV 81.0 78.0 - 100.0 fL   MCH 27.0 26.0 - 34.0 pg   MCHC 33.3 30.0 - 36.0 g/dL   RDW 40.9 81.1 - 91.4 %   Platelets 301 150 - 400 K/uL  hCG, quantitative, pregnancy     Status: Abnormal   Collection Time: 04/25/15  7:15 PM  Result Value Ref Range   hCG, Beta Chain, Quant, S 179 (H) <5 mIU/mL  US Ob Comp Less 14 Wks  04/25/2015   CLINICAL DATA:  Abdominal and pelvic pain  EXAM: OBSTETRIC <14 WK [name] AND TRANSVAGINAL OB US  TECHNIQUE: Both transabdominal and transvaginal ultrasound examinations were performed for complete evaluation of the gestation as well as the maternal uterus, adnexal regions, and pelvic cul-de-sac. Transvaginal technique was performed to assess early pregnancy.  COMPARISON:  Feb 18, 2015  FINDINGS: Intrauterine gestational sac: Not visualized  Yolk sac:  Not visualized  Embryo:  Not visualized  Cardiac Activity: Not visualized  Maternal uterus/adnexae: The previously  noted intrauterine fetus is no longer appreciable. Uterus measures 6.0 x 4.4 x 5.4 cm. There is no intrauterine mass. The endometrium measures 11 mm in thickness with a smooth contour. Right ovary measures 4.7 x 2.2 x 2.8 cm. Left ovary measures 2.8 x 1.3 x 1.5 cm. There is no extrauterine pelvic or adnexal mass. There is trace free fluid in the cul-de-sac.  IMPRESSION: The previously noted intrauterine fetus is no longer appreciable. Currently there is no demonstrable intrauterine gestation. There is trace free fluid in the cul-de-sac.  The study is otherwise unremarkable.  This examination knees to be correlated with beta HCG values. If the beta HCG is positive, differential considerations must include new intrauterine gestation too early to be seen by either transabdominal or transvaginal technique; recent spontaneous abortion; possible ectopic gestation. To further differentiate between these possibilities if the beta HCG is positive, close clinical and serial beta HCG evaluations will be warranted. Timing of repeat imaging will in large part depend on serial beta HCG values.   Electronically Signed   By: Bretta Bang III M.D.   On: 04/25/2015 20:09   US Ob Transvaginal  04/25/2015   CLINICAL DATA:  Abdominal and pelvic pain  EXAM: OBSTETRIC <14 WK [name] AND TRANSVAGINAL OB US  TECHNIQUE: Both transabdominal and transvaginal ultrasound examinations were performed for complete evaluation of the gestation as well as the maternal uterus, adnexal regions, and pelvic cul-de-sac. Transvaginal technique was performed to assess early pregnancy.  COMPARISON:  Feb 18, 2015  FINDINGS: Intrauterine gestational sac: Not visualized  Yolk sac:  Not visualized  Embryo:  Not visualized  Cardiac Activity: Not visualized  Maternal uterus/adnexae: The previously noted intrauterine fetus is no longer appreciable. Uterus measures 6.0 x 4.4 x 5.4 cm. There is no intrauterine mass. The endometrium measures 11 mm in thickness with a  smooth contour. Right ovary measures 4.7 x 2.2 x 2.8 cm. Left ovary measures 2.8 x 1.3 x 1.5 cm. There is no extrauterine pelvic or adnexal mass. There is trace free fluid in the cul-de-sac.  IMPRESSION: The previously noted intrauterine fetus is no longer appreciable. Currently there is no demonstrable intrauterine gestation. There is trace free fluid in the cul-de-sac. The study is otherwise unremarkable.  This examination knees to be correlated with beta HCG values. If the beta HCG is positive, differential considerations must include new intrauterine gestation too early to be seen by either transabdominal or transvaginal technique; recent spontaneous abortion; possible ectopic gestation. To further differentiate between these possibilities if the beta HCG is positive, close clinical and serial beta HCG evaluations will be warranted. Timing of repeat imaging will in large part depend on serial beta HCG values.   Electronically Signed   By: Bretta Bang III M.D.   On: 04/25/2015 20:09     Assessment and Plan  abd pain in early pregnancy Discussed ectopic precautions and importance of follow up Repeat hcg in 48 hours  Gerhardt Gleed 04/25/2015, 6:21 PM

## 2015-04-27 ENCOUNTER — Inpatient Hospital Stay (HOSPITAL_COMMUNITY)
Admission: AD | Admit: 2015-04-27 | Discharge: 2015-04-27 | Disposition: A | Discharge status: Home / Self Care | Payer: Self-pay | Source: Ambulatory Visit | Attending: Obstetrics | Admitting: Obstetrics

## 2015-04-27 DIAGNOSIS — O3680X0 Pregnancy with inconclusive fetal viability, not applicable or unspecified: Secondary | ICD-10-CM

## 2015-04-27 DIAGNOSIS — Z3A01 Less than 8 weeks gestation of pregnancy: Secondary | ICD-10-CM | POA: Insufficient documentation

## 2015-04-27 DIAGNOSIS — E349 Endocrine disorder, unspecified: Secondary | ICD-10-CM

## 2015-04-27 DIAGNOSIS — O9989 Other specified diseases and conditions complicating pregnancy, childbirth and the puerperium: Secondary | ICD-10-CM | POA: Insufficient documentation

## 2015-04-27 LAB — HCG, QUANTITATIVE, PREGNANCY: hCG, Beta Chain, Quant, S: 552 m[IU]/mL — ABNORMAL HIGH (ref <5)

## 2015-04-27 NOTE — MAU Note (Signed)
Pt presents for repeat BHCG, reports continued lower abd pain but denies bleeding.

## 2015-04-27 NOTE — Discharge Instructions (Signed)
Prenatal Care  WHAT IS PRENATAL CARE?  Prenatal care means health care during your pregnancy, before your baby is born. It is very important to take care of yourself and your baby during your pregnancy by:   Getting early prenatal care. If you know you are pregnant, or think you might be pregnant, call your health care provider as soon as possible. Schedule a visit for a prenatal exam.  Getting regular prenatal care. Follow your health care provider's schedule for blood and other necessary tests. Do not miss appointments.  Doing everything you can to keep yourself and your baby healthy during your pregnancy.  Getting complete care. Prenatal care should include evaluation of the medical, dietary, educational, psychological, and social needs of you and your significant other. The medical and genetic history of your family and the family of your baby's father should be discussed with your health care provider.  Discussing with your health care provider:  Prescription, over-the-counter, and herbal medicines that you take.  Any history of substance abuse, alcohol use, smoking, and illegal drug use.  Any history of domestic abuse and violence.  Immunizations you have received.  Your nutrition and diet.  The amount of exercise you do.  Any environmental and occupational hazards to which you are exposed.  History of sexually transmitted infections for both you and your partner.  Previous pregnancies you have had. WHY IS PRENATAL CARE SO IMPORTANT?  By regularly seeing your health care provider, you help ensure that problems can be identified early so that they can be treated as soon as possible. Other problems might be prevented. Many studies have shown that early and regular prenatal care is important for the health of mothers and their babies.  HOW CAN I TAKE CARE OF MYSELF WHILE I AM PREGNANT?  Here are ways to take care of yourself and your baby:   Start or continue taking your  multivitamin with 400 micrograms (mcg) of folic acid every day.  Get early and regular prenatal care. It is very important to see a health care provider during your pregnancy. Your health care provider will check at each visit to make sure that you and your baby are healthy. If there are any problems, action can be taken right away to help you and your baby.  Eat a healthy diet that includes:  Fruits.  Vegetables.  Foods low in saturated fat.  Whole grains.  Calcium-rich foods, such as milk, yogurt, and hard cheeses.  Drink 6-8 glasses of liquids a day.  Unless your health care provider tells you not to, try to be physically active for 30 minutes, most days of the week. If you are pressed for time, you can get your activity in through 10-minute segments, three times a day.  Do not smoke, drink alcohol, or use drugs. These can cause long-term damage to your baby. Talk with your health care provider about steps to take to stop smoking. Talk with a member of your faith community, a counselor, a trusted friend, or your health care provider if you are concerned about your alcohol or drug use.  Ask your health care provider before taking any medicine, even over-the-counter medicines. Some medicines are not safe to take during pregnancy.  Get plenty of rest and sleep.  Avoid hot tubs and saunas during pregnancy.  Do not have X-rays taken unless absolutely necessary and with the recommendation of your health care provider. A lead shield can be placed on your abdomen to protect your baby when   X-rays are taken in other parts of your body.  Do not empty the cat litter when you are pregnant. It may contain a parasite that causes an infection called toxoplasmosis, which can cause birth defects. Also, use gloves when working in garden areas used by cats.  Do not eat uncooked or undercooked meats or fish.  Do not eat soft, mold-ripened cheeses (Brie, Camembert, and chevre) or soft, blue-veined  cheese (Danish blue and Roquefort).  Stay away from toxic chemicals like:  Insecticides.  Solvents (some cleaners or paint thinners).  Lead.  Mercury.  Sexual intercourse may continue until the end of the pregnancy, unless you have a medical problem or there is a problem with the pregnancy and your health care provider tells you not to.  Do not wear high-heel shoes, especially during the second half of the pregnancy. You can lose your balance and fall.  Do not take long trips, unless absolutely necessary. Be sure to see your health care provider before going on the trip.  Do not sit in one position for more than 2 hours when on a trip.  Take a copy of your medical records when going on a trip. Know where a hospital is located in the city you are visiting, in case of an emergency.  Most dangerous household products will have pregnancy warnings on their labels. Ask your health care provider about products if you are unsure.  Limit or eliminate your caffeine intake from coffee, tea, sodas, medicines, and chocolate.  Many women continue working through pregnancy. Staying active might help you stay healthier. If you have a question about the safety or the hours you work at your particular job, talk with your health care provider.  Get informed:  Read books.  Watch videos.  Go to childbirth classes for you and your significant other.  Talk with experienced moms.  Ask your health care provider about childbirth education classes for you and your partner. Classes can help you and your partner prepare for the birth of your baby.  Ask about a baby doctor (pediatrician) and methods and pain medicine for labor, delivery, and possible cesarean delivery. HOW OFTEN SHOULD I SEE MY HEALTH CARE PROVIDER DURING PREGNANCY?  Your health care provider will give you a schedule for your prenatal visits. You will have visits more often as you get closer to the end of your pregnancy. An average  pregnancy lasts about 40 weeks.  A typical schedule includes visiting your health care provider:   About once each month during your first 6 months of pregnancy.  Every 2 weeks during the next 2 months.  Weekly in the last month, until the delivery date. Your health care provider will probably want to see you more often if:  You are older than 35 years.  Your pregnancy is high risk because you have certain health problems or problems with the pregnancy, such as:  Diabetes.  High blood pressure.  The baby is not growing on schedule, according to the dates of the pregnancy. Your health care provider will do special tests to make sure you and your baby are not having any serious problems. WHAT HAPPENS DURING PRENATAL VISITS?   At your first prenatal visit, your health care provider will do a physical exam and talk to you about your health history and the health history of your partner and your family. Your health care provider will be able to tell you what date to expect your baby to be born on.  Your   first physical exam will include checks of your blood pressure, measurements of your height and weight, and an exam of your pelvic organs. Your health care provider will do a Pap test if you have not had one recently and will do cultures of your cervix to make sure there is no infection.  At each prenatal visit, there will be tests of your blood, urine, blood pressure, weight, and the progress of the baby will be checked.  At your later prenatal visits, your health care provider will check how you are doing and how your baby is developing. You may have a number of tests done as your pregnancy progresses.  Ultrasound exams are often used to check on your baby's growth and health.  You may have more urine and blood tests, as well as special tests, if needed. These may include amniocentesis to examine fluid in the pregnancy sac, stress tests to check how the baby responds to contractions, or a  biophysical profile to measure your baby's well-being. Your health care provider will explain the tests and why they are necessary.  You should be tested for high blood sugar (gestational diabetes) between the 24th and 28th weeks of your pregnancy.  You should discuss with your health care provider your plans to breastfeed or bottle-feed your baby.  Each visit is also a chance for you to learn about staying healthy during pregnancy and to ask questions. Document Released: 09/23/2003 Document Revised: 09/25/2013 Document Reviewed: 12/05/2013 ExitCare Patient Information 2015 [employer]. This information is not intended to replace advice given to you by your health care provider. Make sure you discuss any questions you have with your health care provider.  

## 2015-04-27 NOTE — MAU Provider Note (Signed)
History     CSN: [id number]  Arrival date and time: 04/27/15 1952   None     No chief complaint on file. CC: Repeat quant HPI SOLASH TULLO 24 y.o. [id number]  presents for Repeat quant.  She denies vaginal bleeding or abdominal pain at this time.  No new symptoms.   OB History    Gravida Para Term Preterm AB TAB SAB Ectopic Multiple Living   Past Medical History  Diagnosis Date  . BV (bacterial vaginosis)   . Gonorrhea   . Chlamydia   . ADHD (attention deficit hyperactivity disorder)     since childhood  . Hyperemesis     G3  . Asthma     inhaler last used prior to pregnancy  . GERD (gastroesophageal reflux disease)     Past Surgical History  Procedure Laterality Date  . No past surgeries      Family History  Problem Relation Age of Onset  . Cervical cancer Paternal Aunt   . Hypertension Father   . Congestive Heart Failure Maternal Grandmother   . Hypertension Mother     History  Substance Use Topics  . Smoking status: Never Smoker   . Smokeless tobacco: Never Used  . Alcohol Use: Yes     Comment: not while pregnant     Allergies:  Allergies  Allergen Reactions  . Shellfish Allergy Anaphylaxis  . Adhesive [Tape] Swelling and Other (See Comments)    Reaction:  Bruising   . Latex Hives  . Betadine [Povidone Iodine] Rash    Prescriptions prior to admission  Medication Sig Dispense Refill Last Dose  . acetaminophen (TYLENOL) 500 MG tablet Take 500-1,000 mg by mouth every 6 (six) hours as needed for headache (depends on pain level if [phone] mg).   Past Week at Unknown time  . metoCLOPramide (REGLAN) 10 MG tablet Take 1 tablet (10 mg total) by mouth 3 (three) times daily with meals. (Patient not taking: Reported on 04/25/2015) 180 tablet 0 Not Taking at Unknown time  . Prenatal Multivit-Min-Fe-FA (PRENATAL VITAMINS) 0.8 MG tablet Take 1 tablet by mouth daily. (Patient not taking: Reported on 04/25/2015) 30 tablet 12 Not Taking  at Unknown time    ROS Pertinent ROS in HPI.  All other systems are negative.   Physical Exam   Blood pressure 108/64, pulse 87, temperature 98.8 F (37.1 C), temperature source Oral, resp. rate 16, last menstrual period 03/06/2015, SpO2 100 %, unknown if currently breastfeeding.  Physical Exam  Constitutional: She is oriented to person, place, and time. She appears well-developed and well-nourished. No distress.  HENT:  Head: Normocephalic and atraumatic.  Eyes: EOM are normal.  Neck: Normal range of motion.  Cardiovascular: Normal rate.   Respiratory: No respiratory distress.  Musculoskeletal: Normal range of motion.  Neurological: She is alert and oriented to person, place, and time.  Skin: Skin is warm and dry.  Psychiatric: She has a normal mood and affect.   HCG 7/22: 179 HCG 7/24: 552 MAU Course  Procedures  MDM HCG > doubled Asymptomatic  Assessment and Plan   1. Elevated serum hCG   2.  Pregnancy of unknown anatomic location  P: Discharge to home Ectopic precautions discussed and still in place Return to MAU in 48 hours to repeat quant Patient may return to MAU as needed or if her condition were to change or worsen   Glyn Ade, Clydie Braun  E 04/27/2015, 9:38 PM

## 2015-04-28 LAB — GC/CHLAMYDIA PROBE AMP (~~LOC~~) NOT AT ARMC
Chlamydia: NEGATIVE
Neisseria Gonorrhea: NEGATIVE

## 2015-04-29 ENCOUNTER — Inpatient Hospital Stay (HOSPITAL_COMMUNITY)
Admission: AD | Admit: 2015-04-29 | Discharge: 2015-04-29 | Disposition: A | Discharge status: Home / Self Care | Payer: Self-pay | Source: Ambulatory Visit | Attending: Obstetrics | Admitting: Obstetrics

## 2015-04-29 DIAGNOSIS — O9989 Other specified diseases and conditions complicating pregnancy, childbirth and the puerperium: Secondary | ICD-10-CM | POA: Insufficient documentation

## 2015-04-29 DIAGNOSIS — O26899 Other specified pregnancy related conditions, unspecified trimester: Secondary | ICD-10-CM

## 2015-04-29 DIAGNOSIS — R109 Unspecified abdominal pain: Secondary | ICD-10-CM

## 2015-04-29 LAB — HCG, QUANTITATIVE, PREGNANCY: hCG, Beta Chain, Quant, S: 1831 m[IU]/mL — ABNORMAL HIGH (ref <5)

## 2015-04-29 NOTE — Discharge Instructions (Signed)
First Trimester of Pregnancy The first trimester of pregnancy is from week 1 until the end of week 12 (months 1 through 3). During this time, your baby will begin to develop inside you. At 6-8 weeks, the eyes and face are formed, and the heartbeat can be seen on ultrasound. At the end of 12 weeks, all the baby's organs are formed. Prenatal care is all the medical care you receive before the birth of your baby. Make sure you get good prenatal care and follow all of your doctor's instructions. HOME CARE  Medicines  Take medicine only as told by your doctor. Some medicines are safe and some are not during pregnancy.  Take your prenatal vitamins as told by your doctor.  Take medicine that helps you poop (stool softener) as needed if your doctor says it is okay. Diet  Eat regular, healthy meals.  Your doctor will tell you the amount of weight gain that is right for you.  Avoid raw meat and uncooked cheese.  If you feel sick to your stomach (nauseous) or throw up (vomit):  Eat 4 or 5 small meals a day instead of 3 large meals.  Try eating a few soda crackers.  Drink liquids between meals instead of during meals.  If you have a hard time pooping (constipation):  Eat high-fiber foods like fresh vegetables, fruit, and whole grains.  Drink enough fluids to keep your pee (urine) clear or pale yellow. Activity and Exercise  Exercise only as told by your doctor. Stop exercising if you have cramps or pain in your lower belly (abdomen) or low back.  Try to avoid standing for long periods of time. Move your legs often if you must stand in one place for a long time.  Avoid heavy lifting.  Wear low-heeled shoes. Sit and stand up straight.  You can have sex unless your doctor tells you not to. Relief of Pain or Discomfort  Wear a good support bra if your breasts are sore.  Take warm water baths (sitz baths) to soothe pain or discomfort caused by hemorrhoids. Use hemorrhoid cream if your  doctor says it is okay.  Rest with your legs raised if you have leg cramps or low back pain.  Wear support hose if you have puffy, bulging veins (varicose veins) in your legs. Raise (elevate) your feet for 15 minutes, 3-4 times a day. Limit salt in your diet. Prenatal Care  Schedule your prenatal visits by the twelfth week of pregnancy.  Write down your questions. Take them to your prenatal visits.  Keep all your prenatal visits as told by your doctor. Safety  Wear your seat belt at all times when driving.  Make a list of emergency phone numbers. The list should include numbers for family, friends, the hospital, and police and fire departments. General Tips  Ask your doctor for a referral to a local prenatal class. Begin classes no later than at the start of month 6 of your pregnancy.  Ask for help if you need counseling or help with nutrition. Your doctor can give you advice or tell you where to go for help.  Do not use hot tubs, steam rooms, or saunas.  Do not douche or use tampons or scented sanitary pads.  Do not cross your legs for long periods of time.  Avoid litter boxes and soil used by cats.  Avoid all smoking, herbs, and alcohol. Avoid drugs not approved by your doctor.  Visit your dentist. At home, brush your teeth   with a soft toothbrush. Be gentle when you floss. GET HELP IF:  You are dizzy.  You have mild cramps or pressure in your lower belly.  You have a nagging pain in your belly area.  You continue to feel sick to your stomach, throw up, or have watery poop (diarrhea).  You have a bad smelling fluid coming from your vagina.  You have pain with peeing (urination).  You have increased puffiness (swelling) in your face, hands, legs, or ankles. GET HELP RIGHT AWAY IF:   You have a fever.  You are leaking fluid from your vagina.  You have spotting or bleeding from your vagina.  You have very bad belly cramping or pain.  You gain or lose weight  rapidly.  You throw up blood. It may look like coffee grounds.  You are around people who have German measles, fifth disease, or chickenpox.  You have a very bad headache.  You have shortness of breath.  You have any kind of trauma, such as from a fall or a car accident. Document Released: 03/08/2008 Document Revised: 02/04/2014 Document Reviewed: 07/31/2013 ExitCare Patient Information 2015 [employer]. This information is not intended to replace advice given to you by your health care provider. Make sure you discuss any questions you have with your health care provider.  

## 2015-04-29 NOTE — MAU Note (Signed)
PT  SAYS  SHE WAS HERE  Sunday    FOR  LABS   AND  TOLD  TO RETURN  TODAY.       DENIES VAG  BLEEDING.    PAIN ON Friday  WA 5-6.   PAIN  ON Sunday  SAME.      NOW PAIN - 3 -  NO MEDS.   SOMETIMES PAIN  IN LOWER ABD  IS A 6 .

## 2015-04-29 NOTE — MAU Provider Note (Signed)
[name]  is a 24 y.o. G3P1011 at [redacted]w[redacted]d who presents to MAU today for follow-up quant hCG after 48 hours. This is the second 48 hour follow-up lab performed for this patient. The patient had appropriate rise after first interval, but given no recent negative after possible recent SAB she has returned today for additional lab work. She denies vaginal bleeding or abdominal pain today.   BP 118/62 mmHg  Pulse 82  Temp(Src) 98.3 F (36.8 C) (Oral)  Resp 20  LMP 03/06/2015 (Approximate)  CONSTITUTIONAL: Well-developed, well-nourished female in no acute distress.  ENT: External right and left ear normal.  EYES: EOM intact, conjunctivae normal.  MUSCULOSKELETAL: Normal range of motion.  CARDIOVASCULAR: Regular heart rate RESPIRATORY: Normal effort NEUROLOGICAL: Alert and oriented to person, place, and time.  SKIN: Skin is warm and dry. No rash noted. Not diaphoretic. No erythema. No pallor. PSYCH: Normal mood and affect. Normal behavior. Normal judgment and thought content.  Results for SYDNE, KRAHL (MRN [id number]) as of 04/29/2015 21:50  Ref. Range 04/25/2015 19:15 04/25/2015 19:50 04/27/2015 20:30 04/29/2015 20:33  HCG, Beta Chain, Quant, S Latest Ref Range: <5 mIU/mL 179 (H)  552 (H) 1831 (H)   MDM Given appropriate rise yet again today it is most likely that this is a new gestation [name] ordered for confirmation in 1 week  A: Appropriate rise in quant hCG after 48 hours   P: Discharge home First trimester/ectopic precautions discussed Patient will return for follow-up US in 1 week. Order placed. They will call the patient with an appointment time Patient may return to MAU as needed or if her condition were to change or worsen   [name], PA-C 04/29/2015 9:50 PM

## 2015-05-05 ENCOUNTER — Ambulatory Visit (HOSPITAL_COMMUNITY): Payer: Self-pay

## 2015-05-08 ENCOUNTER — Ambulatory Visit (HOSPITAL_COMMUNITY)
Admission: RE | Admit: 2015-05-08 | Discharge: 2015-05-08 | Disposition: A | Discharge status: Home / Self Care | Payer: Medicaid Other | Source: Ambulatory Visit | Attending: Medical | Admitting: Medical

## 2015-05-08 ENCOUNTER — Encounter (HOSPITAL_COMMUNITY): Payer: Self-pay | Admitting: Medical

## 2015-05-08 ENCOUNTER — Inpatient Hospital Stay (HOSPITAL_COMMUNITY)
Admission: AD | Admit: 2015-05-08 | Discharge: 2015-05-08 | Disposition: A | Discharge status: Home / Self Care | Payer: Self-pay | Source: Ambulatory Visit | Attending: Obstetrics | Admitting: Obstetrics

## 2015-05-08 DIAGNOSIS — O208 Other hemorrhage in early pregnancy: Secondary | ICD-10-CM | POA: Diagnosis not present

## 2015-05-08 DIAGNOSIS — N912 Amenorrhea, unspecified: Secondary | ICD-10-CM | POA: Diagnosis present

## 2015-05-08 DIAGNOSIS — O26899 Other specified pregnancy related conditions, unspecified trimester: Secondary | ICD-10-CM

## 2015-05-08 DIAGNOSIS — R109 Unspecified abdominal pain: Secondary | ICD-10-CM

## 2015-05-08 DIAGNOSIS — Z3A01 Less than 8 weeks gestation of pregnancy: Secondary | ICD-10-CM | POA: Diagnosis not present

## 2015-05-08 NOTE — Progress Notes (Signed)
Unable to assess pain. Only seen by APP

## 2015-05-08 NOTE — Discharge Instructions (Signed)
First Trimester of Pregnancy The first trimester of pregnancy is from week 1 until the end of week 12 (months 1 through 3). During this time, your baby will begin to develop inside you. At 6-8 weeks, the eyes and face are formed, and the heartbeat can be seen on ultrasound. At the end of 12 weeks, all the baby's organs are formed. Prenatal care is all the medical care you receive before the birth of your baby. Make sure you get good prenatal care and follow all of your doctor's instructions. HOME CARE  Medicines  Take medicine only as told by your doctor. Some medicines are safe and some are not during pregnancy.  Take your prenatal vitamins as told by your doctor.  Take medicine that helps you poop (stool softener) as needed if your doctor says it is okay. Diet  Eat regular, healthy meals.  Your doctor will tell you the amount of weight gain that is right for you.  Avoid raw meat and uncooked cheese.  If you feel sick to your stomach (nauseous) or throw up (vomit):  Eat 4 or 5 small meals a day instead of 3 large meals.  Try eating a few soda crackers.  Drink liquids between meals instead of during meals.  If you have a hard time pooping (constipation):  Eat high-fiber foods like fresh vegetables, fruit, and whole grains.  Drink enough fluids to keep your pee (urine) clear or pale yellow. Activity and Exercise  Exercise only as told by your doctor. Stop exercising if you have cramps or pain in your lower belly (abdomen) or low back.  Try to avoid standing for long periods of time. Move your legs often if you must stand in one place for a long time.  Avoid heavy lifting.  Wear low-heeled shoes. Sit and stand up straight.  You can have sex unless your doctor tells you not to. Relief of Pain or Discomfort  Wear a good support bra if your breasts are sore.  Take warm water baths (sitz baths) to soothe pain or discomfort caused by hemorrhoids. Use hemorrhoid cream if your  doctor says it is okay.  Rest with your legs raised if you have leg cramps or low back pain.  Wear support hose if you have puffy, bulging veins (varicose veins) in your legs. Raise (elevate) your feet for 15 minutes, 3-4 times a day. Limit salt in your diet. Prenatal Care  Schedule your prenatal visits by the twelfth week of pregnancy.  Write down your questions. Take them to your prenatal visits.  Keep all your prenatal visits as told by your doctor. Safety  Wear your seat belt at all times when driving.  Make a list of emergency phone numbers. The list should include numbers for family, friends, the hospital, and police and fire departments. General Tips  Ask your doctor for a referral to a local prenatal class. Begin classes no later than at the start of month 6 of your pregnancy.  Ask for help if you need counseling or help with nutrition. Your doctor can give you advice or tell you where to go for help.  Do not use hot tubs, steam rooms, or saunas.  Do not douche or use tampons or scented sanitary pads.  Do not cross your legs for long periods of time.  Avoid litter boxes and soil used by cats.  Avoid all smoking, herbs, and alcohol. Avoid drugs not approved by your doctor.  Visit your dentist. At home, brush your teeth   with a soft toothbrush. Be gentle when you floss. GET HELP IF:  You are dizzy.  You have mild cramps or pressure in your lower belly.  You have a nagging pain in your belly area.  You continue to feel sick to your stomach, throw up, or have watery poop (diarrhea).  You have a bad smelling fluid coming from your vagina.  You have pain with peeing (urination).  You have increased puffiness (swelling) in your face, hands, legs, or ankles. GET HELP RIGHT AWAY IF:   You have a fever.  You are leaking fluid from your vagina.  You have spotting or bleeding from your vagina.  You have very bad belly cramping or pain.  You gain or lose weight  rapidly.  You throw up blood. It may look like coffee grounds.  You are around people who have German measles, fifth disease, or chickenpox.  You have a very bad headache.  You have shortness of breath.  You have any kind of trauma, such as from a fall or a car accident. Document Released: 03/08/2008 Document Revised: 02/04/2014 Document Reviewed: 07/31/2013 ExitCare Patient Information 2015 [employer]. This information is not intended to replace advice given to you by your health care provider. Make sure you discuss any questions you have with your health care provider.  

## 2015-05-08 NOTE — MAU Provider Note (Signed)
[name] is a 24 y.o. G3P1011 at 102w0d who presents to MAU today for follow-up US results. She denies abdominal pain, vaginal bleeding, N/V or fever today.   LMP 03/06/2015 (Approximate)  GENERAL: Well-developed, well-nourished female in no acute distress.  HEENT: Normocephalic, atraumatic.   LUNGS: Effort normal HEART: Regular rate  SKIN: Warm, dry and without erythema PSYCH: Normal mood and affect  US Ob Transvaginal  05/08/2015   CLINICAL DATA:  Apparent recent spontaneous abortion; lack of menstrual periods since apparent spontaneous abortion.  EXAM: TRANSVAGINAL OB ULTRASOUND  TECHNIQUE: Transvaginal ultrasound was performed for complete evaluation of the gestation as well as the maternal uterus, adnexal regions, and pelvic cul-de-sac.  COMPARISON:  Feb 18, 2015 and April 25, 2015  FINDINGS: Intrauterine gestational sac: Visualized/normal in shape.  Yolk sac:  Visualized  Embryo:  Visualized  Cardiac Activity: Visualized  Heart Rate: 100 bpm  CRL:   6  mm   6 w 4 d                  [name] EDC: December 28, 2015  Maternal uterus/adnexae: There is a 4 x 3 mm subchorionic hemorrhage. Cervical os is closed. The maternal ovaries are normal in size and contour without extrauterine mass on either side. There is no free pelvic fluid.  IMPRESSION: Single live intrauterine gestation with estimated gestational age of 6+ weeks. It is felt that the patient had a spontaneous abortion shortly after the May 2016 study and is now pregnant with an intrauterine gestation, currently visualized. There is a small subchorionic hemorrhage. Study otherwise unremarkable.   Electronically Signed   By: Bretta Bang III M.D.   On: 05/08/2015 14:19    A: SIUP at [redacted]w[redacted]d Small subchorionic hemorrhage  P: Discharge home Patient advised to start taking prenatal vitamins daily First trimester warning signs and bleeding precautions discussed Patient advised to start prenatal care with Dr. Gaynell Face as planned  Patient  may return to MAU as needed or if her condition were to change or worsen   [name], PA-C  05/08/2015 2:27 PM

## 2015-05-16 ENCOUNTER — Encounter (HOSPITAL_COMMUNITY): Payer: Self-pay | Admitting: *Deleted

## 2015-05-16 ENCOUNTER — Inpatient Hospital Stay (HOSPITAL_COMMUNITY)
Admission: AD | Admit: 2015-05-16 | Discharge: 2015-05-16 | Disposition: A | Discharge status: Home / Self Care | Payer: Medicaid Other | Source: Ambulatory Visit | Attending: Family Medicine | Admitting: Family Medicine

## 2015-05-16 DIAGNOSIS — O219 Vomiting of pregnancy, unspecified: Secondary | ICD-10-CM | POA: Diagnosis not present

## 2015-05-16 DIAGNOSIS — R109 Unspecified abdominal pain: Secondary | ICD-10-CM | POA: Diagnosis not present

## 2015-05-16 DIAGNOSIS — O26899 Other specified pregnancy related conditions, unspecified trimester: Secondary | ICD-10-CM

## 2015-05-16 DIAGNOSIS — O210 Mild hyperemesis gravidarum: Secondary | ICD-10-CM | POA: Diagnosis present

## 2015-05-16 DIAGNOSIS — Z3A01 Less than 8 weeks gestation of pregnancy: Secondary | ICD-10-CM | POA: Insufficient documentation

## 2015-05-16 LAB — URINALYSIS, ROUTINE W REFLEX MICROSCOPIC
Bilirubin Urine: NEGATIVE
GLUCOSE, UA: NEGATIVE mg/dL
Hgb urine dipstick: NEGATIVE
KETONES UR: NEGATIVE mg/dL
LEUKOCYTES UA: NEGATIVE
Nitrite: NEGATIVE
Protein, ur: NEGATIVE mg/dL
Specific Gravity, Urine: 1.015 (ref 1.005–1.030)
UROBILINOGEN UA: 0.2 mg/dL (ref 0.0–1.0)
pH: 7.5 (ref 5.0–8.0)

## 2015-05-16 MED ORDER — LACTATED RINGERS IV BOLUS (SEPSIS)
1000.0000 mL | Freq: Once | INTRAVENOUS | Status: AC
  Administered 2015-05-16: 1000 mL via INTRAVENOUS

## 2015-05-16 MED ORDER — PROMETHAZINE HCL 12.5 MG PO TABS: 12.5000 mg | ORAL_TABLET | Freq: Four times a day (QID) | ORAL | Status: DC | PRN

## 2015-05-16 MED ORDER — DOXYLAMINE-PYRIDOXINE 10-10 MG PO TBEC: 2.0000 | DELAYED_RELEASE_TABLET | Freq: Every day | ORAL | Status: DC

## 2015-05-16 MED ORDER — METOCLOPRAMIDE HCL 5 MG/ML IJ SOLN
10.0000 mg | Freq: Once | INTRAMUSCULAR | Status: AC
  Administered 2015-05-16: 10 mg via INTRAVENOUS
  Filled 2015-05-16: qty 2

## 2015-05-16 MED ORDER — LACTATED RINGERS IV BOLUS (SEPSIS): 1000.0000 mL | Freq: Once | INTRAVENOUS | Status: DC

## 2015-05-16 NOTE — MAU Note (Signed)
Nausea and vomiting.  Has not been to office yet. Loss of appetite. Tenderness in lower abd.

## 2015-05-16 NOTE — MAU Note (Signed)
Unable to void at this time.

## 2015-05-16 NOTE — Progress Notes (Signed)
Patient states she has not seen Dr. Gaynell Face and will probably get a different doctor. Patient provider changed to faculty practice. Patient is not trying to eat, states she does not have an appetite. States she was here "everyday" with her last pregnancy due to hyperemesis.

## 2015-05-16 NOTE — MAU Provider Note (Signed)
History     CSN: [id number]  Arrival date and time: 05/16/15 7829   First Provider Initiated Contact with Patient 05/16/15 [phone]      Chief Complaint  Patient presents with  . Morning Sickness   HPI   [name] is a 24 y.o. female G44P1011 [redacted]w[redacted]d presenting with N/V; in the last 24 hours she has vomited 8 times. Last night she tried rice and greens and all of that came up. She has not started prenatal care and has not tried any medications for the vomiting.   Plans to start with [facility]; no appointment scheduled.  Patient recently had an [name] that confirmed IUP.   OB History    Gravida Para Term Preterm AB TAB SAB Ectopic Multiple Living   3 1 1  1  1   1       Past Medical History  Diagnosis Date  . BV (bacterial vaginosis)   . Gonorrhea   . Chlamydia   . ADHD (attention deficit hyperactivity disorder)     since childhood  . Hyperemesis     G3  . Asthma     inhaler last used prior to pregnancy  . GERD (gastroesophageal reflux disease)     Past Surgical History  Procedure Laterality Date  . No past surgeries      Family History  Problem Relation Age of Onset  . Cervical cancer Paternal Aunt   . Hypertension Father   . Congestive Heart Failure Maternal Grandmother   . Hypertension Mother     Social History  Substance Use Topics  . Smoking status: Never Smoker   . Smokeless tobacco: Never Used  . Alcohol Use: Yes     Comment: not while pregnant     Allergies:  Allergies  Allergen Reactions  . Shellfish Allergy Anaphylaxis  . Adhesive [Tape] Swelling and Other (See Comments)    Reaction:  Bruising   . Latex Hives  . Betadine [Povidone Iodine] Rash    Prescriptions prior to admission  Medication Sig Dispense Refill Last Dose  . acetaminophen (TYLENOL) 500 MG tablet Take 500-1,000 mg by mouth every 6 (six) hours as needed for headache (depends on pain level if [phone] mg).   Past Week at Unknown time  . metoCLOPramide (REGLAN) 10 MG  tablet Take 1 tablet (10 mg total) by mouth 3 (three) times daily with meals. (Patient not taking: Reported on 04/25/2015) 180 tablet 0 Not Taking at Unknown time  . Prenatal Multivit-Min-Fe-FA (PRENATAL VITAMINS) 0.8 MG tablet Take 1 tablet by mouth daily. (Patient not taking: Reported on 04/25/2015) 30 tablet 12 Not Taking at Unknown time   No results found for this or any previous visit (from the past 48 hour(s)).  Review of Systems  Constitutional: Negative for fever.  Gastrointestinal: Positive for nausea, vomiting and abdominal pain (currently rates her abdominal pain 4/10). Negative for heartburn, diarrhea and constipation.  Genitourinary:       Denies vaginal bleeding.   Neurological: Positive for headaches.   Physical Exam   Blood pressure 111/64, pulse 91, temperature 98.8 F (37.1 C), temperature source Oral, resp. rate 18, height 5\' 5"  (1.651 m), weight 97.523 kg (215 lb), last menstrual period 03/06/2015, unknown if currently breastfeeding.  Physical Exam  Constitutional: She is oriented to person, place, and time. She appears well-developed and well-nourished.  Non-toxic appearance. She does not have a sickly appearance. She does not appear ill. No distress.  HENT:  Head: Normocephalic.  Eyes: Pupils are  equal, round, and reactive to light.  Neck: Neck supple.  Cardiovascular: Normal rate and normal heart sounds.   Respiratory: Effort normal.  GI: Soft. She exhibits no distension. There is no tenderness. There is no rebound.  Musculoskeletal: Normal range of motion.  Neurological: She is alert and oriented to person, place, and time.  Skin: Skin is warm. She is not diaphoretic.  Psychiatric: Her behavior is normal.    MAU Course  Procedures  None  MDM Patient unable to give urine sample upon arrival.  LR bolus Reglan 10 mg IV   Assessment and Plan   Assessment:  1. Nausea and vomiting during pregnancy   2. Abdominal pain in pregnancy     Plan:  Discharge  home in stable condition Return to MAU if symptoms worsen Small, frequent meals RX: Diclegis (authorization form filled out and faxed)        Phenergan  Start prenatal care ASAP Work note given  Ok to take tylenol as directed on the bottle.     Duane Lope, NP 05/16/2015 8:36 AM

## 2015-05-19 ENCOUNTER — Inpatient Hospital Stay (HOSPITAL_COMMUNITY)
Admission: AD | Admit: 2015-05-19 | Discharge: 2015-05-20 | Disposition: A | Discharge status: Home / Self Care | Payer: Medicaid Other | Source: Ambulatory Visit | Attending: Family Medicine | Admitting: Family Medicine

## 2015-05-19 ENCOUNTER — Encounter (HOSPITAL_COMMUNITY): Payer: Self-pay

## 2015-05-19 DIAGNOSIS — Z8619 Personal history of other infectious and parasitic diseases: Secondary | ICD-10-CM | POA: Diagnosis not present

## 2015-05-19 DIAGNOSIS — Z91040 Latex allergy status: Secondary | ICD-10-CM | POA: Insufficient documentation

## 2015-05-19 DIAGNOSIS — Z3A08 8 weeks gestation of pregnancy: Secondary | ICD-10-CM | POA: Diagnosis not present

## 2015-05-19 DIAGNOSIS — O210 Mild hyperemesis gravidarum: Secondary | ICD-10-CM | POA: Diagnosis not present

## 2015-05-19 DIAGNOSIS — R112 Nausea with vomiting, unspecified: Secondary | ICD-10-CM | POA: Diagnosis present

## 2015-05-19 LAB — URINALYSIS, ROUTINE W REFLEX MICROSCOPIC
Bilirubin Urine: NEGATIVE
GLUCOSE, UA: NEGATIVE mg/dL
Hgb urine dipstick: NEGATIVE
Ketones, ur: 40 mg/dL — AB
Nitrite: NEGATIVE
PH: 7.5 (ref 5.0–8.0)
PROTEIN: 30 mg/dL — AB
Specific Gravity, Urine: 1.02 (ref 1.005–1.030)
Urobilinogen, UA: 0.2 mg/dL (ref 0.0–1.0)

## 2015-05-19 LAB — URINE MICROSCOPIC-ADD ON

## 2015-05-19 MED ORDER — LACTATED RINGERS IV SOLN
Freq: Once | INTRAVENOUS | Status: DC
  Filled 2015-05-19: qty 1000

## 2015-05-19 MED ORDER — PROMETHAZINE HCL 25 MG/ML IJ SOLN
25.0000 mg | Freq: Once | INTRAVENOUS | Status: AC
  Administered 2015-05-19: 25 mg via INTRAVENOUS
  Filled 2015-05-19: qty 1

## 2015-05-19 MED ORDER — LACTATED RINGERS IV SOLN: Freq: Once | INTRAVENOUS | Status: DC

## 2015-05-19 MED ORDER — M.V.I. ADULT IV INJ
INJECTION | Freq: Once | INTRAVENOUS | Status: AC
  Administered 2015-05-19: 23:00:00 via INTRAVENOUS
  Filled 2015-05-19: qty 1000

## 2015-05-19 NOTE — MAU Note (Signed)
Pt reports nausea and vomiting x 3 days ago, was here 3 days ago and was given meds and it helps a little but she still vomits every time she eats.

## 2015-05-19 NOTE — MAU Provider Note (Signed)
Chief Complaint: No chief complaint on file.  First Provider Initiated Contact with Patient 05/19/15 2100     SUBJECTIVE HPI: Deanna Myers is a 24 y.o. G3P1011 at [redacted]w[redacted]d who presents to Maternity Admissions reporting N/V more times than she can count in the past 24 hours. Has had N/V for one week. Was seen in MAU 05/16/15. Rx Diclegis, Phenergan. Has been taking Diclegis w/out improvement in N/V. Vomits after each dose. States she could not afford Phenergan Rx. Clarified that it was the Diclegis that she was taking and she states she is sure. Hasn't started [facility]. 9 lb weight loss.   Location: NA Quality: NA Severity: 0/10 on pain scale Duration: 1 week, worsening Context: Mostly w/ eating, but also random  Timing: same Modifying factors: No improvement w/ Diclegis, diet changes Associated signs and symptoms: Neg for fever, chills, diarrhea, abd pain, weakness  Past Medical History  Diagnosis Date  . BV (bacterial vaginosis)   . Gonorrhea   . Chlamydia   . ADHD (attention deficit hyperactivity disorder)     since childhood  . Hyperemesis     G3  . Asthma     inhaler last used prior to pregnancy  . GERD (gastroesophageal reflux disease)    OB History  Gravida Para Term Preterm AB SAB TAB Ectopic Multiple Living  # Outcome Date GA Lbr Len/2nd Weight Sex Delivery Anes PTL Lv  3 Current           2 Term 01/16/14 [redacted]w[redacted]d 05:02 / 01:04 6 lb 5 oz (2.863 kg) F Vag-Spont EPI  Y  1 SAB 2011             Past Surgical History  Procedure Laterality Date  . No past surgeries     Social History   Social History  . Marital Status: Single    Spouse Name: N/A  . Number of Children: 0  . Years of Education: N/A   Occupational History  . Cook    Social History Main Topics  . Smoking status: Never Smoker   . Smokeless tobacco: Never Used  . Alcohol Use: Yes     Comment: not while pregnant   . Drug Use: No  . Sexual Activity:    Partners: Male    Birth  Control/ Protection: None     Comment: pregnant   Other Topics Concern  . Not on file   Social History Narrative   No current facility-administered medications on file prior to encounter.   Current Outpatient Prescriptions on File Prior to Encounter  Medication Sig Dispense Refill  . Doxylamine-Pyridoxine 10-10 MG TBEC Take 2 tablets by mouth at bedtime. Take 2 tablets at bedtime. If symptoms persist, add 1 tab in the AM starting on day 3. If symptoms persist, add 1 tab in the PM starting day 4. 90 tablet 1  . Prenatal Multivit-Min-Fe-FA (PRENATAL VITAMINS) 0.8 MG tablet Take 1 tablet by mouth daily. 30 tablet 12   Allergies  Allergen Reactions  . Shellfish Allergy Anaphylaxis  . Adhesive [Tape] Swelling and Other (See Comments)    Reaction:  Bruising   . Latex Hives  . Betadine [Povidone Iodine] Rash    I have reviewed the past Medical Hx, Surgical Hx, Social Hx, Allergies and Medications.   Review of Systems  Constitutional: Positive for appetite change. Negative for fever and chills.  Gastrointestinal: Positive for nausea and vomiting. Negative for abdominal  pain, diarrhea, constipation and abdominal distention.  Genitourinary: Positive for flank pain. Negative for vaginal bleeding and vaginal discharge.  Neurological: Negative for weakness.   OBJECTIVE Patient Vitals for the past 24 hrs:  BP Temp Temp src Pulse Resp SpO2 Height Weight  05/20/15 0031 (!) 100/46 mmHg 98.4 F (36.9 C) Oral 75 18 - - -  05/19/15 1953 115/62 mmHg 98.3 F (36.8 C) Oral 87 18 99 % 5\' 6"  (1.676 m) 208 lb (94.348 kg)   Constitutional: Well-developed, well-nourished female in no acute distress.  Head: Mucus membranes moist Cardiovascular: normal rate Respiratory: normal rate and effort.  GI: Abd soft, non-tender. Pos BS x 4 MS: Extremities nontender, no edema, normal ROM Neurologic: Alert and oriented x 4.  GU: Neg CVAT.   LAB RESULTS Results for orders placed or performed during the  hospital encounter of 05/19/15 (from the past 24 hour(s))  Urinalysis, Routine w reflex microscopic (not at [facility])     Status: Abnormal   Collection Time: 05/19/15  7:55 PM  Result Value Ref Range   Color, Urine YELLOW YELLOW   APPearance HAZY (A) CLEAR   Specific Gravity, Urine 1.020 1.005 - 1.030   pH 7.5 5.0 - 8.0   Glucose, UA NEGATIVE NEGATIVE mg/dL   Hgb urine dipstick NEGATIVE NEGATIVE   Bilirubin Urine NEGATIVE NEGATIVE   Ketones, ur 40 (A) NEGATIVE mg/dL   Protein, ur 30 (A) NEGATIVE mg/dL   Urobilinogen, UA 0.2 0.0 - 1.0 mg/dL   Nitrite NEGATIVE NEGATIVE   Leukocytes, UA TRACE (A) NEGATIVE  Urine microscopic-add on     Status: Abnormal   Collection Time: 05/19/15  7:55 PM  Result Value Ref Range   Squamous Epithelial / LPF MANY (A) RARE   WBC, UA 11-20 <3 WBC/hpf   Bacteria, UA FEW (A) RARE   Urine-Other MUCOUS PRESENT     IMAGING  MAU COURSE Vomited up on arrival to MAU. Doesn't think she can keep a pill down. IV phenergan, fluid bolus, UA, MV infusion per pt request.   Feeling much better. tolerating crackers.    MDM 24 year-old pregnant female w/ hyperemesis who has not been able to keep down Diclegis at home and not tried any antiemetics responded well to IV phenergan and IV fluids in MAU. No evidence of emergent cause of N/V. Now tolerating POs. Discussed that phenergan tabs are on $4 list. (Pt now has Medicaid anyway) and encouraged pt to use phenergan suppositories when not tolerating POs.   ASSESSMENT 1. Hyperemesis affecting pregnancy, antepartum     PLAN Discharge home in stable condition. Hyperemesis Precautions. Advance diet slowly.  Take Phenergan on scheduled before N/V gets severe.      Follow-up Information    Please follow up.   Why:  Start prenatal care      Follow up with [facility].   Why:  As needed in emergencies   Contact information:   [address] [id number] mc [place] [address] [phone]       Medication List    TAKE these medications        Doxylamine-Pyridoxine 10-10 MG Tbec  Take 2 tablets by mouth at bedtime. Take 2 tablets at bedtime. If symptoms persist, add 1 tab in the AM starting on day 3. If symptoms persist, add 1 tab in the PM starting day 4.     Prenatal Vitamins 0.8 MG tablet  Take 1 tablet by mouth daily.  promethazine 25 MG tablet  Commonly known as:  PHENERGAN  Take 1 tablet (25 mg total) by mouth every 6 (six) hours as needed for nausea or vomiting.     promethazine 25 MG suppository  Commonly known as:  PHENERGAN  Place 1 suppository (25 mg total) rectally every 6 (six) hours as needed for nausea or vomiting.       [place], [place] 05/20/2015  12:37 AM

## 2015-05-20 DIAGNOSIS — O210 Mild hyperemesis gravidarum: Secondary | ICD-10-CM | POA: Diagnosis not present

## 2015-05-20 MED ORDER — PROMETHAZINE HCL 25 MG RE SUPP: 25.0000 mg | Freq: Four times a day (QID) | RECTAL | Status: DC | PRN

## 2015-05-20 MED ORDER — PROMETHAZINE HCL 25 MG PO TABS: 25.0000 mg | ORAL_TABLET | Freq: Four times a day (QID) | ORAL | Status: DC | PRN

## 2015-05-20 NOTE — Discharge Instructions (Signed)
Hyperemesis Gravidarum Hyperemesis gravidarum is a severe form of nausea and vomiting that happens during pregnancy. Hyperemesis is worse than morning sickness. It may cause you to have nausea or vomiting all day for many days. It may keep you from eating and drinking enough food and liquids. Hyperemesis usually occurs during the first half (the first 20 weeks) of pregnancy. It often goes away once a woman is in her second half of pregnancy. However, sometimes hyperemesis continues through an entire pregnancy.  CAUSES  The cause of this condition is not completely known but is thought to be related to changes in the body's hormones when pregnant. It could be from the high level of the pregnancy hormone or an increase in estrogen in the body.  SIGNS AND SYMPTOMS   Severe nausea and vomiting.  Nausea that does not go away.  Vomiting that does not allow you to keep any food down.  Weight loss and body fluid loss (dehydration).  Having no desire to eat or not liking food you have previously enjoyed. DIAGNOSIS  Your health care provider will do a physical exam and ask you about your symptoms. He or she may also order blood tests and urine tests to make sure something else is not causing the problem.  TREATMENT  You may only need medicine to control the problem. If medicines do not control the nausea and vomiting, you will be treated in the hospital to prevent dehydration, increased acid in the blood (acidosis), weight loss, and changes in the electrolytes in your body that may harm the unborn baby (fetus). You may need IV fluids.  HOME CARE INSTRUCTIONS   Only take over-the-counter or prescription medicines as directed by your health care provider.  Try eating a couple of dry crackers or toast in the morning before getting out of bed.  Avoid foods and smells that upset your stomach.  Avoid fatty and spicy foods.  Eat 5-6 small meals a day.  Do not drink when eating meals. Drink between  meals.  For snacks, eat high-protein foods, such as cheese.  Eat or suck on things that have ginger in them. Ginger helps nausea.  Avoid food preparation. The smell of food can spoil your appetite.  Avoid iron pills and iron in your multivitamins until after 3-4 months of being pregnant. However, consult with your health care provider before stopping any prescribed iron pills. SEEK MEDICAL CARE IF:   Your abdominal pain increases.  You have a severe headache.  You have vision problems.  You are losing weight. SEEK IMMEDIATE MEDICAL CARE IF:   You are unable to keep fluids down.  You vomit blood.  You have constant nausea and vomiting.  You have excessive weakness.  You have extreme thirst.  You have dizziness or fainting.  You have a fever or persistent symptoms for more than 2-3 days.  You have a fever and your symptoms suddenly get worse. MAKE SURE YOU:   Understand these instructions.  Will watch your condition.  Will get help right away if you are not doing well or get worse. Document Released: 09/20/2005 Document Revised: 07/11/2013 Document Reviewed: 05/02/2013 [facility] Patient Information 2015 [place], [place]. This information is not intended to replace advice given to you by your health care provider. Make sure you discuss any questions you have with your health care provider.   Eating Plan for Hyperemesis Gravidarum Severe cases of hyperemesis gravidarum can lead to dehydration and malnutrition. The hyperemesis eating plan is one way to lessen  the symptoms of nausea and vomiting. It is often used with prescribed medicines to control your symptoms.  °WHAT CAN I DO TO RELIEVE MY SYMPTOMS? °Listen to your body. Everyone is different and has different preferences. Find what works best for you. Some of the following things may help: °· Eat and drink slowly. °· Eat 5-6 small meals daily instead of 3 large meals.   °· Eat crackers before you get out of bed in the  morning.   °· Starchy foods are usually well tolerated (such as cereal, toast, bread, potatoes, pasta, rice, and pretzels).   °· Ginger may help with nausea. Add ¼ tsp ground ginger to hot tea or choose ginger tea.   °· Try drinking 100% fruit juice or an electrolyte drink. °· Continue to take your prenatal vitamins as directed by your health care provider. If you are having trouble taking your prenatal vitamins, talk with your health care provider about different options. °· Include at least 1 serving of protein with your meals and snacks (such as meats or poultry, beans, nuts, eggs, or yogurt). Try eating a protein-rich snack before bed (such as cheese and crackers or a half turkey or peanut butter sandwich). °WHAT THINGS SHOULD I AVOID TO REDUCE MY SYMPTOMS? °The following things may help reduce your symptoms: °· Avoid foods with strong smells. Try eating meals in well-ventilated areas that are free of odors. °· Avoid drinking water or other beverages with meals. Try not to drink anything less than 30 minutes before and after meals. °· Avoid drinking more than 1 cup of fluid at a time. °· Avoid fried or high-fat foods, such as butter and cream sauces. °· Avoid spicy foods. °· Avoid skipping meals the best you can. Nausea can be more intense on an empty stomach. If you cannot tolerate food at that time, do not force it. Try sucking on ice chips or other frozen items and make up the calories later. °· Avoid lying down within 2 hours after eating. °Document Released: 07/18/2007 Document Revised: 09/25/2013 Document Reviewed: 07/25/2013 °ExitCare® Patient Information ©2015 [employer]. This information is not intended to replace advice given to you by your health care provider. Make sure you discuss any questions you have with your health care provider. ° °

## 2015-06-02 ENCOUNTER — Inpatient Hospital Stay (HOSPITAL_COMMUNITY)
Admission: AD | Admit: 2015-06-02 | Discharge: 2015-06-02 | Disposition: A | Discharge status: Home / Self Care | Payer: Medicaid Other | Source: Ambulatory Visit | Attending: Obstetrics and Gynecology | Admitting: Obstetrics and Gynecology

## 2015-06-02 ENCOUNTER — Encounter (HOSPITAL_COMMUNITY): Payer: Self-pay | Admitting: *Deleted

## 2015-06-02 DIAGNOSIS — N898 Other specified noninflammatory disorders of vagina: Secondary | ICD-10-CM | POA: Diagnosis present

## 2015-06-02 DIAGNOSIS — B9689 Other specified bacterial agents as the cause of diseases classified elsewhere: Secondary | ICD-10-CM | POA: Insufficient documentation

## 2015-06-02 DIAGNOSIS — E876 Hypokalemia: Secondary | ICD-10-CM | POA: Diagnosis not present

## 2015-06-02 DIAGNOSIS — Z3A10 10 weeks gestation of pregnancy: Secondary | ICD-10-CM | POA: Insufficient documentation

## 2015-06-02 DIAGNOSIS — O210 Mild hyperemesis gravidarum: Secondary | ICD-10-CM | POA: Diagnosis not present

## 2015-06-02 DIAGNOSIS — O219 Vomiting of pregnancy, unspecified: Secondary | ICD-10-CM | POA: Diagnosis not present

## 2015-06-02 DIAGNOSIS — O23591 Infection of other part of genital tract in pregnancy, first trimester: Secondary | ICD-10-CM | POA: Insufficient documentation

## 2015-06-02 DIAGNOSIS — N760 Acute vaginitis: Secondary | ICD-10-CM | POA: Insufficient documentation

## 2015-06-02 LAB — COMPREHENSIVE METABOLIC PANEL
ALBUMIN: 3.2 g/dL — AB (ref 3.5–5.0)
ALT: 11 U/L — ABNORMAL LOW (ref 14–54)
AST: 17 U/L (ref 15–41)
Alkaline Phosphatase: 37 U/L — ABNORMAL LOW (ref 38–126)
Anion gap: 8 (ref 5–15)
BILIRUBIN TOTAL: 0.2 mg/dL — AB (ref 0.3–1.2)
BUN: 5 mg/dL — AB (ref 6–20)
CHLORIDE: 102 mmol/L (ref 101–111)
CO2: 25 mmol/L (ref 22–32)
Calcium: 8.7 mg/dL — ABNORMAL LOW (ref 8.9–10.3)
Creatinine, Ser: 0.44 mg/dL (ref 0.44–1.00)
GFR calc Af Amer: >60 mL/min (ref >60)
GFR calc non Af Amer: >60 mL/min (ref >60)
GLUCOSE: 114 mg/dL — AB (ref 65–99)
POTASSIUM: 3.2 mmol/L — AB (ref 3.5–5.1)
Sodium: 135 mmol/L (ref 135–145)
TOTAL PROTEIN: 7 g/dL (ref 6.5–8.1)

## 2015-06-02 LAB — URINALYSIS, ROUTINE W REFLEX MICROSCOPIC
BILIRUBIN URINE: NEGATIVE
Glucose, UA: NEGATIVE mg/dL
HGB URINE DIPSTICK: NEGATIVE
Ketones, ur: 15 mg/dL — AB
Nitrite: NEGATIVE
PROTEIN: 100 mg/dL — AB
Specific Gravity, Urine: 1.015 (ref 1.005–1.030)
UROBILINOGEN UA: 0.2 mg/dL (ref 0.0–1.0)
pH: 8 (ref 5.0–8.0)

## 2015-06-02 LAB — WET PREP, GENITAL
Trich, Wet Prep: NONE SEEN
Yeast Wet Prep HPF POC: NONE SEEN

## 2015-06-02 LAB — CBC WITH DIFFERENTIAL/PLATELET
BASOS ABS: 0 10*3/uL (ref 0.0–0.1)
BASOS PCT: 0 % (ref 0–1)
Eosinophils Absolute: 0.2 10*3/uL (ref 0.0–0.7)
Eosinophils Relative: 3 % (ref 0–5)
HEMATOCRIT: 32.1 % — AB (ref 36.0–46.0)
Hemoglobin: 10.8 g/dL — ABNORMAL LOW (ref 12.0–15.0)
Lymphocytes Relative: 33 % (ref 12–46)
Lymphs Abs: 2.3 10*3/uL (ref 0.7–4.0)
MCH: 27.4 pg (ref 26.0–34.0)
MCHC: 33.6 g/dL (ref 30.0–36.0)
MCV: 81.5 fL (ref 78.0–100.0)
MONO ABS: 0.5 10*3/uL (ref 0.1–1.0)
Monocytes Relative: 7 % (ref 3–12)
NEUTROS ABS: 4 10*3/uL (ref 1.7–7.7)
NEUTROS PCT: 57 % (ref 43–77)
Platelets: 283 10*3/uL (ref 150–400)
RBC: 3.94 MIL/uL (ref 3.87–5.11)
RDW: 14.3 % (ref 11.5–15.5)
WBC: 7 10*3/uL (ref 4.0–10.5)

## 2015-06-02 LAB — URINE MICROSCOPIC-ADD ON

## 2015-06-02 MED ORDER — POTASSIUM CHLORIDE CRYS ER 20 MEQ PO TBCR
20.0000 meq | EXTENDED_RELEASE_TABLET | Freq: Once | ORAL | Status: AC
  Administered 2015-06-02: 20 meq via ORAL
  Filled 2015-06-02: qty 1

## 2015-06-02 MED ORDER — METRONIDAZOLE 0.75 % VA GEL: 1.0000 | Freq: Two times a day (BID) | VAGINAL | Status: DC

## 2015-06-02 MED ORDER — PROMETHAZINE HCL 25 MG/ML IJ SOLN
25.0000 mg | Freq: Once | INTRAMUSCULAR | Status: AC
  Administered 2015-06-02: 25 mg via INTRAVENOUS
  Filled 2015-06-02: qty 1

## 2015-06-02 MED ORDER — METOCLOPRAMIDE HCL 10 MG PO TABS: 10.0000 mg | ORAL_TABLET | Freq: Four times a day (QID) | ORAL | Status: DC

## 2015-06-02 NOTE — MAU Note (Signed)
Thinks she has a yeast infection or BV.  Hasn't been able to keep anything down for the past 3 days.  Urine is very dark with a strong urine smell. Thinks she is dehydrated. Has been losing weight.

## 2015-06-02 NOTE — Discharge Instructions (Signed)
Bacterial Vaginosis Bacterial vaginosis is a vaginal infection that occurs when the normal balance of bacteria in the vagina is disrupted. It results from an overgrowth of certain bacteria. This is the most common vaginal infection in women of childbearing age. Treatment is important to prevent complications, especially in pregnant women, as it can cause a premature delivery. CAUSES  Bacterial vaginosis is caused by an increase in harmful bacteria that are normally present in smaller amounts in the vagina. Several different kinds of bacteria can cause bacterial vaginosis. However, the reason that the condition develops is not fully understood. RISK FACTORS Certain activities or behaviors can put you at an increased risk of developing bacterial vaginosis, including:  Having a new sex partner or multiple sex partners.  Douching.  Using an intrauterine device (IUD) for contraception. Women do not get bacterial vaginosis from toilet seats, bedding, swimming pools, or contact with objects around them. SIGNS AND SYMPTOMS  Some women with bacterial vaginosis have no signs or symptoms. Common symptoms include:  Grey vaginal discharge.  A fishlike odor with discharge, especially after sexual intercourse.  Itching or burning of the vagina and vulva.  Burning or pain with urination. DIAGNOSIS  Your health care provider will take a medical history and examine the vagina for signs of bacterial vaginosis. A sample of vaginal fluid may be taken. Your health care provider will look at this sample under a microscope to check for bacteria and abnormal cells. A vaginal pH test may also be done.  TREATMENT  Bacterial vaginosis may be treated with antibiotic medicines. These may be given in the form of a pill or a vaginal cream. A second round of antibiotics may be prescribed if the condition comes back after treatment.  HOME CARE INSTRUCTIONS   Only take over-the-counter or prescription medicines as  directed by your health care provider.  If antibiotic medicine was prescribed, take it as directed. Make sure you finish it even if you start to feel better.  Do not have sex until treatment is completed.  Tell all sexual partners that you have a vaginal infection. They should see their health care provider and be treated if they have problems, such as a mild rash or itching.  Practice safe sex by using condoms and only having one sex partner. SEEK MEDICAL CARE IF:   Your symptoms are not improving after 3 days of treatment.  You have increased discharge or pain.  You have a fever. MAKE SURE YOU:   Understand these instructions.  Will watch your condition.  Will get help right away if you are not doing well or get worse. FOR MORE INFORMATION  Centers for Disease Control and Prevention, Division of STD Prevention: SolutionApps.co.za American Sexual Health Association (ASHA): www.ashastd.org  Document Released: 09/20/2005 Document Revised: 07/11/2013 Document Reviewed: 05/02/2013 [facility] Patient Information 2015 [place], [place]. This information is not intended to replace advice given to you by your health care provider. Make sure you discuss any questions you have with your health care provider. Eating Plan for Hyperemesis Gravidarum Severe cases of hyperemesis gravidarum can lead to dehydration and malnutrition. The hyperemesis eating plan is one way to lessen the symptoms of nausea and vomiting. It is often used with prescribed medicines to control your symptoms.  WHAT CAN I DO TO RELIEVE MY SYMPTOMS? Listen to your body. Everyone is different and has different preferences. Find what works best for you. Some of the following things may help:  Eat and drink slowly.  Eat 5-6 small meals  daily instead of 3 large meals.   Eat crackers before you get out of bed in the morning.   Starchy foods are usually well tolerated (such as cereal, toast, bread, potatoes, pasta, rice, and  pretzels).   Ginger may help with nausea. Add  tsp ground ginger to hot tea or choose ginger tea.   Try drinking 100% fruit juice or an electrolyte drink.  Continue to take your prenatal vitamins as directed by your health care provider. If you are having trouble taking your prenatal vitamins, talk with your health care provider about different options.  Include at least 1 serving of protein with your meals and snacks (such as meats or poultry, beans, nuts, eggs, or yogurt). Try eating a protein-rich snack before bed (such as cheese and crackers or a half [place] or peanut butter sandwich). WHAT THINGS SHOULD I AVOID TO REDUCE MY SYMPTOMS? The following things may help reduce your symptoms:  Avoid foods with strong smells. Try eating meals in well-ventilated areas that are free of odors.  Avoid drinking water or other beverages with meals. Try not to drink anything less than 30 minutes before and after meals.  Avoid drinking more than 1 cup of fluid at a time.  Avoid fried or high-fat foods, such as butter and cream sauces.  Avoid spicy foods.  Avoid skipping meals the best you can. Nausea can be more intense on an empty stomach. If you cannot tolerate food at that time, do not force it. Try sucking on ice chips or other frozen items and make up the calories later.  Avoid lying down within 2 hours after eating. Document Released: 07/18/2007 Document Revised: 09/25/2013 Document Reviewed: 07/25/2013 [facility] Patient Information 2015 [place], [place]. This information is not intended to replace advice given to you by your health care provider. Make sure you discuss any questions you have with your health care provider.

## 2015-06-02 NOTE — MAU Provider Note (Signed)
History     CSN: [id number]  Arrival date and time: 06/02/15 1212   First Provider Initiated Contact with Patient 06/02/15 1355      Chief Complaint  Patient presents with  . Emesis  . Vaginal Discharge   HPI  Deanna Myers is a 24 y.o. G3P1011 at [redacted]w[redacted]d who presents to MAU today with complaint of N/V and vaginal discharge. The patient states that she has been taking Diclegis q 6 hours and Phenergan PO and suppositories regularly. Her last dose was at 0600 today. She states little relief with these medications. She feels that she may be dehydrated because her urine has been dark. She denies diarrhea or sick contacts. She denies UTI symptoms. She states a small amount of foul smelling white discharge noted recently. She denies vaginal bleeding.   OB History    Gravida Para Term Preterm AB TAB SAB Ectopic Multiple Living   3 1 1  1  1   1       Past Medical History  Diagnosis Date  . BV (bacterial vaginosis)   . Gonorrhea   . Chlamydia   . ADHD (attention deficit hyperactivity disorder)     since childhood  . Hyperemesis     G3  . Asthma     inhaler last used prior to pregnancy  . GERD (gastroesophageal reflux disease)     Past Surgical History  Procedure Laterality Date  . No past surgeries      Family History  Problem Relation Age of Onset  . Cervical cancer Paternal Aunt   . Hypertension Father   . Congestive Heart Failure Maternal Grandmother   . Hypertension Mother     Social History  Substance Use Topics  . Smoking status: Never Smoker   . Smokeless tobacco: Never Used  . Alcohol Use: Yes     Comment: not while pregnant     Allergies:  Allergies  Allergen Reactions  . Shellfish Allergy Anaphylaxis  . Adhesive [Tape] Swelling and Other (See Comments)    Reaction:  Bruising   . Latex Hives  . Betadine [Povidone Iodine] Rash    Prescriptions prior to admission  Medication Sig Dispense Refill Last Dose  . Doxylamine-Pyridoxine 10-10 MG TBEC  Take 2 tablets by mouth at bedtime. Take 2 tablets at bedtime. If symptoms persist, add 1 tab in the AM starting on day 3. If symptoms persist, add 1 tab in the PM starting day 4. 90 tablet 1 Past Week at Unknown time  . Prenatal Multivit-Min-Fe-FA (PRENATAL VITAMINS) 0.8 MG tablet Take 1 tablet by mouth daily. 30 tablet 12 Past Week at Unknown time  . promethazine (PHENERGAN) 25 MG suppository Place 1 suppository (25 mg total) rectally every 6 (six) hours as needed for nausea or vomiting. 12 suppository 1 06/02/2015 at Unknown time  . promethazine (PHENERGAN) 25 MG tablet Take 1 tablet (25 mg total) by mouth every 6 (six) hours as needed for nausea or vomiting. 30 tablet 2 Past Week at Unknown time    Review of Systems  Constitutional: Negative for fever and malaise/fatigue.  Gastrointestinal: Positive for nausea and vomiting. Negative for abdominal pain, diarrhea and constipation.  Genitourinary: Negative for dysuria, urgency and frequency.       Neg -vaginal bleeding + vaginal discharge   Physical Exam   Blood pressure 122/75, pulse 104, temperature 99 F (37.2 C), temperature source Oral, resp. rate 18, weight 207 lb 9.6 oz (94.167 kg), last menstrual period 03/06/2015, unknown if currently  breastfeeding.  Physical Exam  Nursing note and vitals reviewed. Constitutional: She is oriented to person, place, and time. She appears well-developed and well-nourished. No distress.  HENT:  Head: Normocephalic and atraumatic.  Cardiovascular: Normal rate.   Respiratory: Effort normal.  GI: Soft. She exhibits no distension and no mass. There is no tenderness. There is no rebound and no guarding.  Genitourinary: Uterus is enlarged. Cervix exhibits no motion tenderness, no discharge and no friability. No bleeding in the vagina. Vaginal discharge (moderate amount of thin, white discharge with foul odor noted) found.  Cervix: closed, thick, posterior  Neurological: She is alert and oriented to person,  place, and time.  Skin: Skin is warm and dry. No erythema.  Psychiatric: She has a normal mood and affect.   Results for orders placed or performed during the hospital encounter of 06/02/15 (from the past 24 hour(s))  Urinalysis, Routine w reflex microscopic (not at [facility])     Status: Abnormal   Collection Time: 06/02/15 12:30 PM  Result Value Ref Range   Color, Urine YELLOW YELLOW   APPearance HAZY (A) CLEAR   Specific Gravity, Urine 1.015 1.005 - 1.030   pH 8.0 5.0 - 8.0   Glucose, UA NEGATIVE NEGATIVE mg/dL   Hgb urine dipstick NEGATIVE NEGATIVE   Bilirubin Urine NEGATIVE NEGATIVE   Ketones, ur 15 (A) NEGATIVE mg/dL   Protein, ur 191 (A) NEGATIVE mg/dL   Urobilinogen, UA 0.2 0.0 - 1.0 mg/dL   Nitrite NEGATIVE NEGATIVE   Leukocytes, UA SMALL (A) NEGATIVE  Urine microscopic-add on     Status: Abnormal   Collection Time: 06/02/15 12:30 PM  Result Value Ref Range   Squamous Epithelial / LPF MANY (A) RARE   WBC, UA 21-50 <3 WBC/hpf   RBC / HPF 0-2 <3 RBC/hpf   Bacteria, UA MANY (A) RARE   Urine-Other MUCOUS PRESENT   Wet prep, genital     Status: Abnormal   Collection Time: 06/02/15  2:34 PM  Result Value Ref Range   Yeast Wet Prep HPF POC NONE SEEN NONE SEEN   Trich, Wet Prep NONE SEEN NONE SEEN   Clue Cells Wet Prep HPF POC FEW (A) NONE SEEN   WBC, Wet Prep HPF POC MODERATE (A) NONE SEEN  CBC with Differential/Platelet     Status: Abnormal   Collection Time: 06/02/15  4:57 PM  Result Value Ref Range   WBC 7.0 4.0 - 10.5 K/uL   RBC 3.94 3.87 - 5.11 MIL/uL   Hemoglobin 10.8 (L) 12.0 - 15.0 g/dL   HCT 47.8 (L) 29.5 - 62.1 %   MCV 81.5 78.0 - 100.0 fL   MCH 27.4 26.0 - 34.0 pg   MCHC 33.6 30.0 - 36.0 g/dL   RDW 30.8 65.7 - 84.6 %   Platelets 283 150 - 400 K/uL   Neutrophils Relative % 57 43 - 77 %   Neutro Abs 4.0 1.7 - 7.7 K/uL   Lymphocytes Relative 33 12 - 46 %   Lymphs Abs 2.3 0.7 - 4.0 K/uL   Monocytes Relative 7 3 - 12 %   Monocytes Absolute 0.5 0.1 - 1.0 K/uL    Eosinophils Relative 3 0 - 5 %   Eosinophils Absolute 0.2 0.0 - 0.7 K/uL   Basophils Relative 0 0 - 1 %   Basophils Absolute 0.0 0.0 - 0.1 K/uL  Comprehensive metabolic panel     Status: Abnormal   Collection Time: 06/02/15  4:57 PM  Result Value Ref Range  Sodium 135 135 - 145 mmol/L   Potassium 3.2 (L) 3.5 - 5.1 mmol/L   Chloride 102 101 - 111 mmol/L   CO2 25 22 - 32 mmol/L   Glucose, Bld 114 (H) 65 - 99 mg/dL   BUN 5 (L) 6 - 20 mg/dL   Creatinine, Ser 1.61 0.44 - 1.00 mg/dL   Calcium 8.7 (L) 8.9 - 10.3 mg/dL   Total Protein 7.0 6.5 - 8.1 g/dL   Albumin 3.2 (L) 3.5 - 5.0 g/dL   AST 17 15 - 41 U/L   ALT 11 (L) 14 - 54 U/L   Alkaline Phosphatase 37 (L) 38 - 126 U/L   Total Bilirubin 0.2 (L) 0.3 - 1.2 mg/dL   GFR calc non Af Amer >60 >60 mL/min   GFR calc Af Amer >60 >60 mL/min   Anion gap 8 5 - 15    MAU Course  Procedures Limited bedside US performed as FHR was not obtained with doppler GS appears normal. Fetal movement noted. FHR noted. Rate not determined, but appears normal for GA.   MDM UA today shows mild dehydration Wet prep today Urine culture pending Patient is able to tolerate PO liquids while in MAU, however she is still having some vomiting and has tried PO medications prior to arrival.  1 liter IV LR with 25 mg Phenergan infusion given in MAU Discussed patient with Dr. Mindi Slicker. Recommends CBC and CMP today. If normal, ok for discharge with Rx for Reglan to add to regimen of anti-emetics.  K+ = 3.2 - will replace with PO KDur as patient is tolerating PO well now. 20 mEq PO given.  Assessment and Plan  A: SIUP at [redacted]w[redacted]d Nausea and vomiting in pregnancy prior to [redacted] weeks gestation Bacterial Vaginosis Hypokalemia, mild  P: Discharge home Rx for Metrogel and Reglan given to patient Warning signs for worsening condition and diet for N/V discussed Patient advised to follow-up with [facility] as scheduled for routine prenatal care Patient may return to MAU  as needed or if her condition were to change or worsen   [name], PA-C  06/02/2015, 6:25 PM

## 2015-06-04 LAB — CULTURE, OB URINE

## 2015-06-06 ENCOUNTER — Other Ambulatory Visit: Payer: Self-pay | Admitting: Medical

## 2015-06-06 DIAGNOSIS — O2341 Unspecified infection of urinary tract in pregnancy, first trimester: Secondary | ICD-10-CM

## 2015-06-06 MED ORDER — CEPHALEXIN 500 MG PO CAPS: 500.0000 mg | ORAL_CAPSULE | Freq: Four times a day (QID) | ORAL | Status: DC

## 2015-06-06 NOTE — Progress Notes (Signed)
Urine culture shows +UTI. Rx for Keflex sent to patient's pharmacy. Called patient and informed her of diagnosis and Rx. Confirmed pharmacy. Advised to increase PO hydration. Patient voiced understanding.   [name], PA-C 06/06/2015 8:04 PM

## 2015-06-10 LAB — OB RESULTS CONSOLE HIV ANTIBODY (ROUTINE TESTING): HIV: NONREACTIVE

## 2015-06-10 LAB — OB RESULTS CONSOLE GC/CHLAMYDIA
CHLAMYDIA, DNA PROBE: NEGATIVE
Gonorrhea: NEGATIVE

## 2015-06-10 LAB — OB RESULTS CONSOLE RPR: RPR: NONREACTIVE

## 2015-06-12 ENCOUNTER — Encounter (HOSPITAL_COMMUNITY): Payer: Self-pay

## 2015-06-12 ENCOUNTER — Observation Stay (HOSPITAL_COMMUNITY)
Admission: AD | Admit: 2015-06-12 | Discharge: 2015-06-13 | Disposition: A | Discharge status: Home / Self Care | Payer: Medicaid Other | Source: Ambulatory Visit | Attending: Obstetrics and Gynecology | Admitting: Obstetrics and Gynecology

## 2015-06-12 DIAGNOSIS — O2301 Infections of kidney in pregnancy, first trimester: Secondary | ICD-10-CM

## 2015-06-12 DIAGNOSIS — J45909 Unspecified asthma, uncomplicated: Secondary | ICD-10-CM | POA: Diagnosis not present

## 2015-06-12 DIAGNOSIS — Z3A11 11 weeks gestation of pregnancy: Secondary | ICD-10-CM | POA: Insufficient documentation

## 2015-06-12 DIAGNOSIS — O99511 Diseases of the respiratory system complicating pregnancy, first trimester: Secondary | ICD-10-CM | POA: Insufficient documentation

## 2015-06-12 DIAGNOSIS — N390 Urinary tract infection, site not specified: Secondary | ICD-10-CM | POA: Diagnosis present

## 2015-06-12 DIAGNOSIS — N12 Tubulo-interstitial nephritis, not specified as acute or chronic: Secondary | ICD-10-CM | POA: Diagnosis not present

## 2015-06-12 DIAGNOSIS — K219 Gastro-esophageal reflux disease without esophagitis: Secondary | ICD-10-CM | POA: Insufficient documentation

## 2015-06-12 DIAGNOSIS — O9989 Other specified diseases and conditions complicating pregnancy, childbirth and the puerperium: Secondary | ICD-10-CM | POA: Diagnosis present

## 2015-06-12 DIAGNOSIS — O99611 Diseases of the digestive system complicating pregnancy, first trimester: Secondary | ICD-10-CM | POA: Diagnosis not present

## 2015-06-12 DIAGNOSIS — Z8619 Personal history of other infectious and parasitic diseases: Secondary | ICD-10-CM | POA: Insufficient documentation

## 2015-06-12 DIAGNOSIS — O99341 Other mental disorders complicating pregnancy, first trimester: Secondary | ICD-10-CM | POA: Diagnosis not present

## 2015-06-12 DIAGNOSIS — F909 Attention-deficit hyperactivity disorder, unspecified type: Secondary | ICD-10-CM | POA: Insufficient documentation

## 2015-06-12 HISTORY — DX: Infections of kidney in pregnancy, first trimester: O23.01

## 2015-06-12 LAB — COMPREHENSIVE METABOLIC PANEL
ALT: 16 U/L (ref 14–54)
ANION GAP: 6 (ref 5–15)
AST: 15 U/L (ref 15–41)
Albumin: 3.5 g/dL (ref 3.5–5.0)
Alkaline Phosphatase: 42 U/L (ref 38–126)
BUN: <5 mg/dL — ABNORMAL LOW (ref 6–20)
CHLORIDE: 104 mmol/L (ref 101–111)
CO2: 25 mmol/L (ref 22–32)
Calcium: 8.5 mg/dL — ABNORMAL LOW (ref 8.9–10.3)
Creatinine, Ser: 0.43 mg/dL — ABNORMAL LOW (ref 0.44–1.00)
GFR calc non Af Amer: >60 mL/min (ref >60)
Glucose, Bld: 94 mg/dL (ref 65–99)
POTASSIUM: 3.6 mmol/L (ref 3.5–5.1)
SODIUM: 135 mmol/L (ref 135–145)
Total Bilirubin: 0.3 mg/dL (ref 0.3–1.2)
Total Protein: 7.5 g/dL (ref 6.5–8.1)

## 2015-06-12 LAB — URINE MICROSCOPIC-ADD ON

## 2015-06-12 LAB — CBC
HCT: 32.6 % — ABNORMAL LOW (ref 36.0–46.0)
Hemoglobin: 11 g/dL — ABNORMAL LOW (ref 12.0–15.0)
MCH: 27.3 pg (ref 26.0–34.0)
MCHC: 33.7 g/dL (ref 30.0–36.0)
MCV: 80.9 fL (ref 78.0–100.0)
PLATELETS: 337 10*3/uL (ref 150–400)
RBC: 4.03 MIL/uL (ref 3.87–5.11)
RDW: 14.4 % (ref 11.5–15.5)
WBC: 7.1 10*3/uL (ref 4.0–10.5)

## 2015-06-12 LAB — TYPE AND SCREEN
ABO/RH(D): O POS
Antibody Screen: NEGATIVE

## 2015-06-12 LAB — URINALYSIS, ROUTINE W REFLEX MICROSCOPIC
Bilirubin Urine: NEGATIVE
Glucose, UA: NEGATIVE mg/dL
KETONES UR: NEGATIVE mg/dL
NITRITE: NEGATIVE
PH: 6.5 (ref 5.0–8.0)
Protein, ur: NEGATIVE mg/dL
Urobilinogen, UA: 0.2 mg/dL (ref 0.0–1.0)

## 2015-06-12 MED ORDER — DEXTROSE-NACL 5-0.45 % IV SOLN
INTRAVENOUS | Status: DC
  Administered 2015-06-13: 125 mL/h via INTRAVENOUS

## 2015-06-12 MED ORDER — HYDROMORPHONE HCL 1 MG/ML IJ SOLN
1.0000 mg | Freq: Once | INTRAMUSCULAR | Status: AC
  Administered 2015-06-12: 1 mg via INTRAVENOUS
  Filled 2015-06-12: qty 1

## 2015-06-12 MED ORDER — PRENATAL MULTIVITAMIN CH
1.0000 | ORAL_TABLET | Freq: Every day | ORAL | Status: DC
  Administered 2015-06-13: 1 via ORAL
  Filled 2015-06-12: qty 1

## 2015-06-12 MED ORDER — DEXTROSE 5 % IV SOLN
2.0000 g | INTRAVENOUS | Status: DC
  Administered 2015-06-12: 2 g via INTRAVENOUS
  Filled 2015-06-12 (×2): qty 2

## 2015-06-12 MED ORDER — PROMETHAZINE HCL 25 MG PO TABS
25.0000 mg | ORAL_TABLET | Freq: Four times a day (QID) | ORAL | Status: DC | PRN
  Administered 2015-06-13: 25 mg via ORAL
  Filled 2015-06-12 (×2): qty 1

## 2015-06-12 MED ORDER — ACETAMINOPHEN 325 MG PO TABS
650.0000 mg | ORAL_TABLET | ORAL | Status: DC | PRN
  Administered 2015-06-13: 650 mg via ORAL
  Filled 2015-06-12: qty 2

## 2015-06-12 MED ORDER — DOCUSATE SODIUM 100 MG PO CAPS
100.0000 mg | ORAL_CAPSULE | Freq: Every day | ORAL | Status: DC
  Administered 2015-06-13: 100 mg via ORAL
  Filled 2015-06-12: qty 1

## 2015-06-12 MED ORDER — DOXYLAMINE-PYRIDOXINE 10-10 MG PO TBEC: 2.0000 | DELAYED_RELEASE_TABLET | Freq: Every day | ORAL | Status: DC

## 2015-06-12 MED ORDER — CALCIUM CARBONATE ANTACID 500 MG PO CHEW: 2.0000 | CHEWABLE_TABLET | ORAL | Status: DC | PRN

## 2015-06-12 MED ORDER — SODIUM CHLORIDE 0.9 % IV SOLN
12.5000 mg | Freq: Four times a day (QID) | INTRAVENOUS | Status: DC | PRN
  Administered 2015-06-13 (×2): 12.5 mg via INTRAVENOUS
  Filled 2015-06-12 (×3): qty 0.5

## 2015-06-12 MED ORDER — ONDANSETRON 8 MG PO TBDP
8.0000 mg | ORAL_TABLET | Freq: Three times a day (TID) | ORAL | Status: DC | PRN
  Filled 2015-06-12: qty 1

## 2015-06-12 MED ORDER — ZOLPIDEM TARTRATE 5 MG PO TABS: 5.0000 mg | ORAL_TABLET | Freq: Every evening | ORAL | Status: DC | PRN

## 2015-06-12 MED ORDER — PROMETHAZINE HCL 25 MG RE SUPP: 25.0000 mg | Freq: Four times a day (QID) | RECTAL | Status: DC | PRN

## 2015-06-12 MED ORDER — SODIUM CHLORIDE 0.9 % IV SOLN
8.0000 mg | Freq: Three times a day (TID) | INTRAVENOUS | Status: DC | PRN
  Administered 2015-06-12: 8 mg via INTRAVENOUS
  Filled 2015-06-12 (×2): qty 4

## 2015-06-12 MED ORDER — LACTATED RINGERS IV SOLN
INTRAVENOUS | Status: DC
  Administered 2015-06-12: 20:00:00 via INTRAVENOUS

## 2015-06-12 MED ORDER — PROMETHAZINE HCL 25 MG/ML IJ SOLN
25.0000 mg | Freq: Once | INTRAMUSCULAR | Status: AC
  Administered 2015-06-12: 25 mg via INTRAVENOUS
  Filled 2015-06-12: qty 1

## 2015-06-12 NOTE — MAU Note (Signed)
Report called to Debbie RN on Women's Unit. Will go to [phone]

## 2015-06-12 NOTE — MAU Provider Note (Signed)
History     CSN: [id number]  Arrival date and time: 06/12/15 1731   First Provider Initiated Contact with Patient 06/12/15 2003       Chief Complaint  Patient presents with  . Emesis  . Flank Pain   HPI  Deanna Myers is a 24 y.o. G3P1011 at [redacted]w[redacted]d who presents with flank pain.  Diagnosed with UTI on 8/29. Didn't fill abx rx until a few days ago & only took 1 pill d/t vomiting.  Had temp of 100 yesterday.  Woke up this morning with left flank pain. States urine is dark & feels like she can't empty her bladder.  Denies hematuria.  Denies abdominal pain or vaginal bleeding.  Rates pain as 10/10. Has not treated.    OB History    Gravida Para Term Preterm AB TAB SAB Ectopic Multiple Living   Past Medical History  Diagnosis Date  . BV (bacterial vaginosis)   . Gonorrhea   . Chlamydia   . ADHD (attention deficit hyperactivity disorder)     since childhood  . Hyperemesis     G3  . Asthma     inhaler last used prior to pregnancy  . GERD (gastroesophageal reflux disease)     Past Surgical History  Procedure Laterality Date  . No past surgeries      Family History  Problem Relation Age of Onset  . Cervical cancer Paternal Aunt   . Hypertension Father   . Congestive Heart Failure Maternal Grandmother   . Hypertension Mother     Social History  Substance Use Topics  . Smoking status: Never Smoker   . Smokeless tobacco: Never Used  . Alcohol Use: Yes     Comment: not while pregnant     Allergies:  Allergies  Allergen Reactions  . Shellfish Allergy Anaphylaxis  . Adhesive [Tape] Swelling and Other (See Comments)    Reaction:  Bruising   . Latex Hives  . Betadine [Povidone Iodine] Rash    Prescriptions prior to admission  Medication Sig Dispense Refill Last Dose  . cephALEXin (KEFLEX) 500 MG capsule Take 1 capsule (500 mg total) by mouth 4 (four) times daily. 28 capsule 0   . Doxylamine-Pyridoxine 10-10 MG TBEC Take 2 tablets by  mouth at bedtime. Take 2 tablets at bedtime. If symptoms persist, add 1 tab in the AM starting on day 3. If symptoms persist, add 1 tab in the PM starting day 4. 90 tablet 1 Past Week at Unknown time  . metoCLOPramide (REGLAN) 10 MG tablet Take 1 tablet (10 mg total) by mouth every 6 (six) hours. 30 tablet 0   . metroNIDAZOLE (METROGEL VAGINAL) 0.75 % vaginal gel Place 1 Applicatorful vaginally 2 (two) times daily. 70 g 0   . Prenatal Multivit-Min-Fe-FA (PRENATAL VITAMINS) 0.8 MG tablet Take 1 tablet by mouth daily. 30 tablet 12 Past Week at Unknown time  . promethazine (PHENERGAN) 25 MG suppository Place 1 suppository (25 mg total) rectally every 6 (six) hours as needed for nausea or vomiting. 12 suppository 1 06/02/2015 at Unknown time  . promethazine (PHENERGAN) 25 MG tablet Take 1 tablet (25 mg total) by mouth every 6 (six) hours as needed for nausea or vomiting. 30 tablet 2 Past Week at Unknown time    Review of Systems  Constitutional: Positive for fever and chills.  Gastrointestinal: Positive for nausea and vomiting. Negative for abdominal pain.  Genitourinary:  Positive for dysuria, frequency and flank pain. Negative for hematuria.       No vaginal bleeding   Physical Exam   Blood pressure 121/71, pulse 99, temperature 98.4 F (36.9 C), resp. rate 18, height 5\' 6"  (1.676 m), weight 95.074 kg (209 lb 9.6 oz), last menstrual period 03/06/2015, unknown if currently breastfeeding.  Physical Exam  Nursing note and vitals reviewed. Constitutional: She is oriented to person, place, and time. She appears well-developed and well-nourished. She appears distressed (pt rocking back in forth in bed holding her side).  HENT:  Head: Normocephalic and atraumatic.  Eyes: Conjunctivae are normal. Right eye exhibits no discharge. Left eye exhibits no discharge. No scleral icterus.  Neck: Normal range of motion.  Cardiovascular: Normal rate, regular rhythm and normal heart sounds.   No murmur  heard. Respiratory: Effort normal and breath sounds normal. No respiratory distress. She has no wheezes.  GI: Soft. She exhibits no distension. There is no tenderness. There is CVA tenderness (left).  Neurological: She is alert and oriented to person, place, and time.  Skin: Skin is warm and dry. She is not diaphoretic.  Psychiatric: She has a normal mood and affect. Her behavior is normal. Judgment and thought content normal.    MAU Course  Procedures Results for orders placed or performed during the hospital encounter of 06/12/15 (from the past 24 hour(s))  Urinalysis, Routine w reflex microscopic (not at [facility])     Status: Abnormal   Collection Time: 06/12/15  5:55 PM  Result Value Ref Range   Color, Urine YELLOW YELLOW   APPearance CLEAR CLEAR   Specific Gravity, Urine <1.005 (L) 1.005 - 1.030   pH 6.5 5.0 - 8.0   Glucose, UA NEGATIVE NEGATIVE mg/dL   Hgb urine dipstick TRACE (A) NEGATIVE   Bilirubin Urine NEGATIVE NEGATIVE   Ketones, ur NEGATIVE NEGATIVE mg/dL   Protein, ur NEGATIVE NEGATIVE mg/dL   Urobilinogen, UA 0.2 0.0 - 1.0 mg/dL   Nitrite NEGATIVE NEGATIVE   Leukocytes, UA MODERATE (A) NEGATIVE  Urine microscopic-add on     Status: None   Collection Time: 06/12/15  5:55 PM  Result Value Ref Range   Squamous Epithelial / LPF RARE RARE   WBC, UA 0-2 <3 WBC/hpf   RBC / HPF 0-2 <3 RBC/hpf   Bacteria, UA RARE RARE  CBC     Status: Abnormal   Collection Time: 06/12/15  8:10 PM  Result Value Ref Range   WBC 7.1 4.0 - 10.5 K/uL   RBC 4.03 3.87 - 5.11 MIL/uL   Hemoglobin 11.0 (L) 12.0 - 15.0 g/dL   HCT 40.9 (L) 81.1 - 91.4 %   MCV 80.9 78.0 - 100.0 fL   MCH 27.3 26.0 - 34.0 pg   MCHC 33.7 30.0 - 36.0 g/dL   RDW 78.2 95.6 - 21.3 %   Platelets 337 150 - 400 K/uL  Comprehensive metabolic panel     Status: Abnormal   Collection Time: 06/12/15  8:10 PM  Result Value Ref Range   Sodium 135 135 - 145 mmol/L   Potassium 3.6 3.5 - 5.1 mmol/L   Chloride 104 101 - 111 mmol/L    CO2 25 22 - 32 mmol/L   Glucose, Bld 94 65 - 99 mg/dL   BUN <5 (L) 6 - 20 mg/dL   Creatinine, Ser 0.86 (L) 0.44 - 1.00 mg/dL   Calcium 8.5 (L) 8.9 - 10.3 mg/dL   Total Protein 7.5 6.5 - 8.1 g/dL   Albumin  3.5 3.5 - 5.0 g/dL   AST 15 15 - 41 U/L   ALT 16 14 - 54 U/L   Alkaline Phosphatase 42 38 - 126 U/L   Total Bilirubin 0.3 0.3 - 1.2 mg/dL   GFR calc non Af Amer >60 >60 mL/min   GFR calc Af Amer >60 >60 mL/min   Anion gap 6 5 - 15    MDM IV fluids & dilaudid 2023- C/w Dr. Ellyn Hack. Admit to gyn for IV antibiotics  Assessment and Plan  A: 1. Pyelonephritis affecting pregnancy in first trimester    P: Admit to women's unit for IV antibiotics  Judeth Horn, NP  06/12/2015, 7:54 PM

## 2015-06-12 NOTE — MAU Note (Signed)
Pt reports she was dx with BV and UTI. HAs not been able to keep antibiotic down. Now c/o of left flank pain. Called MD office and told to come for possible IV antibiotics and fluids.

## 2015-06-12 NOTE — H&P (Signed)
Deanna Myers is a 24 y.o. female G3P1011 at 11+3 with UTI, intolerant of PO.  E coli pansensitive - sensitive to Rocephin.  Zofran  po q 8, phenergan po/pr.  Pt to bring own Diclegis.  UTI dx's didn't get meds at first, then couldn't tolerate.  No fever or white count.  Some CVAT   Maternal Medical History:  Reason for admission: Nausea.   Prenatal Complications - Diabetes: none.    OB History    Gravida Para Term Preterm AB TAB SAB Ectopic Multiple Living   G1 SVD G2 SAB G3 present  No abn pap H/o GC/Chl  Past Medical History  Diagnosis Date  . BV (bacterial vaginosis)   . Gonorrhea   . Chlamydia   . ADHD (attention deficit hyperactivity disorder)     since childhood  . Hyperemesis     G3  . Asthma     inhaler last used prior to pregnancy  . GERD (gastroesophageal reflux disease)    Past Surgical History  Procedure Laterality Date  . No past surgeries     Family History: family history includes Cervical cancer in her paternal aunt; Congestive Heart Failure in her maternal grandmother; Hypertension in her father and mother. Social History:  reports that she has never smoked. She has never used smokeless tobacco. She reports that she drinks alcohol. She reports that she does not use illicit drugs.h/o MJ; single in relationship Meds Diclegis, Zofran, Phenergan po and pr AllLatex, NKDA   Prenatal Transfer Tool  Maternal Diabetes: No Genetic Screening: Declined Maternal Ultrasounds/Referrals: Normal Fetal Ultrasounds or other Referrals:  None Maternal Substance Abuse:  Yes:  Type: Marijuana h/o Significant Maternal Medications:  Meds include: Other: Diclegis, phenergan, Zofran Significant Maternal Lab Results:  None Other Comments:  hyperemesis and UTI  Review of Systems  Constitutional: Positive for weight loss.  HENT: Negative.   Eyes: Negative.   Respiratory: Negative.   Cardiovascular: Negative.   Gastrointestinal: Positive for  nausea and vomiting.  Genitourinary: Positive for dysuria.  Musculoskeletal: Negative.   Skin: Negative.   Neurological: Negative.   Psychiatric/Behavioral: Negative.       Blood pressure 116/68, pulse 87, temperature 98.2 F (36.8 C), temperature source Oral, resp. rate 20, height  (1.676 m), weight 95.074 kg (209 lb 9.6 oz), last menstrual period 03/06/2015, SpO2 100 %, unknown if currently breastfeeding. Maternal Exam:  Abdomen: Patient reports no abdominal tenderness.   Physical Exam  Constitutional: She is oriented to person, place, and time. She appears well-developed and well-nourished.  HENT:  Head: Normocephalic and atraumatic.  Cardiovascular: Normal rate and regular rhythm.   Respiratory: Effort normal and breath sounds normal. No respiratory distress. She has no wheezes.  GI: Soft. Bowel sounds are normal. She exhibits no distension. There is no tenderness.  ?CVAT  Musculoskeletal: Normal range of motion.  Neurological: She is alert and oriented to person, place, and time.  Skin: Skin is warm and dry.  Psychiatric: She has a normal mood and affect. Her behavior is normal.    Prenatal labs: ABO, Rh:  O+ Antibody:  neg Rubella:  Nonimmune RPR: Non Reactive (03/19 1815)  HBsAg:   neg HIV: NONREACTIVE (09/20 0027)  GBS:   unknown  Hgb 11.5/ E coli UTI/CHL neg/GC neg/ varicella immune/CF neg/ nl Hgb electro  [name] cwd, sm SCH, +FHTs  Assessment/Plan: Admit for hydration and antinausea Treat UTI with Rocephin  Bovard-Stuckert, Kohei Antonellis 06/12/2015, 10:12 PM

## 2015-06-12 NOTE — MAU Note (Signed)
Phenergan suppositories do work for patient for a little while

## 2015-06-13 DIAGNOSIS — N390 Urinary tract infection, site not specified: Secondary | ICD-10-CM | POA: Diagnosis present

## 2015-06-13 HISTORY — DX: Urinary tract infection, site not specified: N39.0

## 2015-06-13 MED ORDER — PYRIDOXINE HCL 25 MG PO TABS
25.0000 mg | ORAL_TABLET | Freq: Every day | ORAL | Status: DC
  Administered 2015-06-13: 25 mg via ORAL
  Filled 2015-06-13: qty 1

## 2015-06-13 MED ORDER — NITROFURANTOIN MONOHYD MACRO 100 MG PO CAPS
100.0000 mg | ORAL_CAPSULE | Freq: Two times a day (BID) | ORAL | Status: DC
  Administered 2015-06-13 (×2): 100 mg via ORAL
  Filled 2015-06-13 (×3): qty 1

## 2015-06-13 MED ORDER — ONDANSETRON 8 MG PO TBDP: 8.0000 mg | ORAL_TABLET | Freq: Three times a day (TID) | ORAL | Status: DC | PRN

## 2015-06-13 MED ORDER — DOXYLAMINE SUCCINATE (SLEEP) 25 MG PO TABS
25.0000 mg | ORAL_TABLET | Freq: Every day | ORAL | Status: DC
  Administered 2015-06-13: 25 mg via ORAL
  Filled 2015-06-13: qty 1

## 2015-06-13 NOTE — Discharge Summary (Signed)
Obstetric Discharge Summary Reason for Admission: observation/evaluation and treatment of UTI in pregnancy/intolerance of PO Prenatal Procedures: ultrasound and iv fluid hydration with iv antibiotics and antinausea medications Intrapartum Procedures: none Postpartum Procedures: n/a Complications-Operative and Postpartum: n/a HEMOGLOBIN  Date Value Ref Range Status  06/12/2015 11.0* 12.0 - 15.0 g/dL Final   HCT  Date Value Ref Range Status  06/12/2015 32.6* 36.0 - 46.0 % Final    Physical Exam:  General: alert, fatigued and no distress Lochia: none Uterine Fundus: consistent with [redacted]weeks ga  Discharge Diagnoses: UTI and intractible nausea in first trimester  Discharge Information: Date: 06/13/2015 Activity: unrestricted Diet: routine Medications: keflex, prenatal vitamin, zofran ODT, phenergan and reglan Condition: improved Instructions: refer to practice specific booklet and notify OBGYN if fever >100.4 Discharge to: home Follow-up Information    Schedule an appointment as soon as possible for a visit with Sharol Given Day Deery, DO.   Specialty:  Obstetrics and Gynecology   Why:  As needed if symptoms persist. Keep regular scheduled antenatal appointment   Contact information:   [address] [place] [address] [phone]       Newborn Data: This patient has no babies on file. Home with undelivered.  Mare Loan Worema Andrei Mccook 06/13/2015, 4:59 PM

## 2015-06-13 NOTE — Progress Notes (Addendum)
Patient ID: Deanna Myers, female   DOB: 1991/06/15, 24 y.o.   MRN: [id number] Pt seen and examined this am. She feels better than last night. Nausea relieved and pain controlled. Denies any fever or continued CVAT. Hungry - ready for breakfast  VSS- afebrile FHT - present  ABD- consistent with [redacted] wks gestational age  A: 24 yo at 7 wks 5 days ga with UTI complication pregnancy- stable after IV fluids and IV antibx  P: S/p 2gm rocephin last night; start on macrobid  po bid today     Continue with antinausea meds - diclegis and phenergan prn     Will reassess later today after has has PO meal and if doing well will likely discharge to home

## 2015-06-13 NOTE — Discharge Instructions (Signed)
Eat small frequent meals every 2-3hrs Take keflex as prescribed  by mouth 4x daily

## 2015-06-14 LAB — CULTURE, OB URINE: Culture: >=100000

## 2015-07-10 DIAGNOSIS — F411 Generalized anxiety disorder: Secondary | ICD-10-CM

## 2015-07-18 DIAGNOSIS — F411 Generalized anxiety disorder: Secondary | ICD-10-CM

## 2015-07-18 NOTE — Congregational Nurse Program (Signed)
07/10/15--Client [facility].  Client crying, upset about incident this [facility] at [employer].  She has a one yr old daughter and was trying to apply for help with Christmas.  She was told that there was no more help that could be given.  [facility] and just was distraught emotionally.  Allowed to ventilate and "tell her story".  Client single mom, pregnant, no child support from father, working part time but needed help with Christmas gifts.  Encouraged to breathe deeply and slowly to lessen crying and distress.  She did calm and we discussed options. [facility] to find solution for Christmas for one yr old.  She is to bring a list in with items she would like to have for her child.  [facility] supportive of client and assured she would not be penalized for her absence today.  Will follow up at next opportunity

## 2015-07-23 NOTE — Congregational Nurse Program (Signed)
[facility]  Was referred because she was crying in the job coaches office.  We brought her to a more private place.  She was upset because she had just went to the [employer] and was told no more applications could be done for this season.  She is a single mother with a young daughter.  She had tried to get help and had failed.  She just was having a hard time  Calming herself She was allowed to ventilate and encouraged to take some deep breath to help.  Discussed help for Christmas.  Both myself and [facility] [employer]aye Webb will help to provide Christmas gifts for her family.  She revealed she was pregnant.  Client getting prenatal care already.  Client was encouraged to [facility] her [facility] and working.  Client seems to be trying very hard to provide for her family.  She says"Im not lazy"  Client to bring a list of gifts she would like to get for her child.  Will followup when she brings in her list or prn.

## 2015-07-24 DIAGNOSIS — F411 Generalized anxiety disorder: Secondary | ICD-10-CM

## 2015-07-24 NOTE — Congregational Nurse Program (Signed)
Congregational Nurse Program Note  Date of Encounter: 07/24/2015  Past Medical History: Past Medical History  Diagnosis Date  . BV (bacterial vaginosis)   . Gonorrhea   . Chlamydia   . ADHD (attention deficit hyperactivity disorder)     since childhood  . Hyperemesis     G3  . Asthma     inhaler last used prior to pregnancy  . GERD (gastroesophageal reflux disease)     Encounter Details:     CNP Questionnaire - 07/18/15 2348    Patient Demographics   Is this a new or existing patient? New   Patient is considered a/an Not Applicable   Patient Assistance   Patient's financial/insurance status Medicaid   Patient referred to apply for the following financial assistance Not Applicable   Food insecurities addressed Not Applicable   Transportation assistance No   Assistance securing medications No   Educational health offerings Behavioral health;Interpersonal relationships   Encounter Details   Primary purpose of visit Spiritual Care/Support Visit   Was an Emergency Department visit averted? No   Does patient have a medical provider? Yes   Patient referred to [facility]   Was a mental health screening completed? (GAINS tool) No   Does patient have dental issues? No   Since previous encounter, have you referred patient for abnormal blood pressure that resulted in a new diagnosis or medication change? No   Since previous encounter, have you referred patient for abnormal blood glucose that resulted in a new diagnosis or medication change? No     Brief encounter with client to check on her mental state.  Client smiling and well dressed.  Client will bring us information to let us know what she wants for her daughters christmas.  Will followup

## 2015-08-13 ENCOUNTER — Encounter (HOSPITAL_COMMUNITY): Payer: Self-pay

## 2015-08-13 ENCOUNTER — Inpatient Hospital Stay (HOSPITAL_COMMUNITY)
Admission: AD | Admit: 2015-08-13 | Discharge: 2015-08-13 | Disposition: A | Discharge status: Home / Self Care | Payer: Medicaid Other | Source: Ambulatory Visit | Attending: Obstetrics and Gynecology | Admitting: Obstetrics and Gynecology

## 2015-08-13 DIAGNOSIS — O219 Vomiting of pregnancy, unspecified: Secondary | ICD-10-CM | POA: Diagnosis not present

## 2015-08-13 DIAGNOSIS — O212 Late vomiting of pregnancy: Secondary | ICD-10-CM | POA: Insufficient documentation

## 2015-08-13 DIAGNOSIS — Z3A20 20 weeks gestation of pregnancy: Secondary | ICD-10-CM | POA: Diagnosis not present

## 2015-08-13 DIAGNOSIS — R102 Pelvic and perineal pain: Secondary | ICD-10-CM | POA: Insufficient documentation

## 2015-08-13 LAB — URINALYSIS, ROUTINE W REFLEX MICROSCOPIC
Bilirubin Urine: NEGATIVE
Glucose, UA: NEGATIVE mg/dL
HGB URINE DIPSTICK: NEGATIVE
Ketones, ur: 15 mg/dL — AB
LEUKOCYTES UA: NEGATIVE
NITRITE: NEGATIVE
Protein, ur: NEGATIVE mg/dL
SPECIFIC GRAVITY, URINE: 1.01 (ref 1.005–1.030)
UROBILINOGEN UA: 0.2 mg/dL (ref 0.0–1.0)
pH: 6.5 (ref 5.0–8.0)

## 2015-08-13 LAB — CBC WITH DIFFERENTIAL/PLATELET
BASOS ABS: 0 10*3/uL (ref 0.0–0.1)
Basophils Relative: 0 %
Eosinophils Absolute: 0.2 10*3/uL (ref 0.0–0.7)
Eosinophils Relative: 3 %
HEMATOCRIT: 31.3 % — AB (ref 36.0–46.0)
HEMOGLOBIN: 10.4 g/dL — AB (ref 12.0–15.0)
LYMPHS ABS: 2.2 10*3/uL (ref 0.7–4.0)
LYMPHS PCT: 33 %
MCH: 27.7 pg (ref 26.0–34.0)
MCHC: 33.2 g/dL (ref 30.0–36.0)
MCV: 83.2 fL (ref 78.0–100.0)
Monocytes Absolute: 0.4 10*3/uL (ref 0.1–1.0)
Monocytes Relative: 6 %
NEUTROS ABS: 3.8 10*3/uL (ref 1.7–7.7)
NEUTROS PCT: 58 %
Platelets: 303 10*3/uL (ref 150–400)
RBC: 3.76 MIL/uL — AB (ref 3.87–5.11)
RDW: 14.1 % (ref 11.5–15.5)
WBC: 6.6 10*3/uL (ref 4.0–10.5)

## 2015-08-13 MED ORDER — M.V.I. ADULT IV INJ
Freq: Once | INTRAVENOUS | Status: AC
  Administered 2015-08-13: 15:00:00 via INTRAVENOUS
  Filled 2015-08-13: qty 10

## 2015-08-13 MED ORDER — SODIUM CHLORIDE 0.9 % IV SOLN
25.0000 mg | Freq: Once | INTRAVENOUS | Status: AC
  Administered 2015-08-13: 25 mg via INTRAVENOUS
  Filled 2015-08-13: qty 1

## 2015-08-13 NOTE — Discharge Instructions (Signed)

## 2015-08-13 NOTE — MAU Note (Signed)
Pt is being treated for a yeast infection and BV. States she was give perscriptions yesterday but ha not picked them up yet

## 2015-08-13 NOTE — MAU Provider Note (Signed)
Chief Complaint:  Emesis and Pelvic Pain   None    Provider in room at 08/13/15 at 12:29pm  HPI: Deanna MastLashawna M Nofziger is a 24 y.o. G3P1011 at 6420w3dwho presents to maternity admissions reporting nausea and vomiting.  States her lower abdominal pain is "from the vomiting, like usual".  States has had nausea and vomiting throughout the pregnancy.  Has not taken her Phenergan suppositories today.  States she did take her "other two meds" (reglan and acid reducer).   Did this throughout all the last pregnancy also. States she needs IV fluids "and a banana bag".   Primary doctor is Dr Mindi SlickerBanga.   She reports good fetal movement, denies LOF, vaginal bleeding, vaginal itching/burning, urinary symptoms, h/a, dizziness, n/v, or fever/chills.     RN Note: Pt presents to MAU with complaints of nausea and vomiting since yesterday with pelvic pain. Denies any vaginal bleeding or abdnormal discharge Emesis  This is a recurrent problem. The current episode started more than 1 month ago. The problem occurs 5 to 10 times per day. The problem has been unchanged. There has been no fever. Associated symptoms include abdominal pain. Pertinent negatives include no chest pain, chills, diarrhea, dizziness, fever, myalgias or sweats. She has tried nothing for the symptoms.    Past Medical History: Past Medical History  Diagnosis Date  . BV (bacterial vaginosis)   . Gonorrhea   . Chlamydia   . ADHD (attention deficit hyperactivity disorder)     since childhood  . Hyperemesis     G3  . Asthma     inhaler last used prior to pregnancy  . GERD (gastroesophageal reflux disease)     Past obstetric history: OB History  Gravida Para Term Preterm AB SAB TAB Ectopic Multiple Living  3 1 1  1 1    1     # Outcome Date GA Lbr Len/2nd Weight Sex Delivery Anes PTL Lv  3 Current           2 Term 01/16/14 3161w2d 05:02 / 01:04 6 lb 5 oz (2.863 kg) F Vag-Spont EPI  Y  1 SAB 2011              Past Surgical History: Past  Surgical History  Procedure Laterality Date  . No past surgeries      Family History: Family History  Problem Relation Age of Onset  . Cervical cancer Paternal Aunt   . Hypertension Father   . Congestive Heart Failure Maternal Grandmother   . Hypertension Mother     Social History: Social History  Substance Use Topics  . Smoking status: Never Smoker   . Smokeless tobacco: Never Used  . Alcohol Use: Yes     Comment: not while pregnant     Allergies:  Allergies  Allergen Reactions  . Shellfish Allergy Anaphylaxis  . Adhesive [Tape] Swelling and Other (See Comments)    Reaction:  Bruising   . Latex Hives  . Betadine [Povidone Iodine] Rash    Meds:  Prescriptions prior to admission  Medication Sig Dispense Refill Last Dose  . Prenatal Multivit-Min-Fe-FA (PRENATAL VITAMINS) 0.8 MG tablet Take 1 tablet by mouth daily. 30 tablet 12 Past Week at Unknown time  . promethazine (PHENERGAN) 25 MG suppository Place 1 suppository (25 mg total) rectally every 6 (six) hours as needed for nausea or vomiting. 12 suppository 1 08/13/2015 at Unknown time  . promethazine (PHENERGAN) 25 MG tablet Take 1 tablet (25 mg total) by mouth every 6 (six) hours  as needed for nausea or vomiting. 30 tablet 2 08/13/2015 at Unknown time  . albuterol (PROVENTIL HFA;VENTOLIN HFA) 108 (90 BASE) MCG/ACT inhaler Inhale 1-2 puffs into the lungs every 6 (six) hours as needed for wheezing or shortness of breath.   rescue  . cephALEXin (KEFLEX) 500 MG capsule Take 1 capsule (500 mg total) by mouth 4 (four) times daily. (Patient not taking: Reported on 08/13/2015) 28 capsule 0 06/11/2015 at Unknown time  . Doxylamine-Pyridoxine 10-10 MG TBEC Take 2 tablets by mouth at bedtime. Take 2 tablets at bedtime. If symptoms persist, add 1 tab in the AM starting on day 3. If symptoms persist, add 1 tab in the PM starting day 4. (Patient not taking: Reported on 08/13/2015) 90 tablet 1 06/11/2015 at Unknown time  . EPINEPHrine 0.3 mg/0.3  mL IJ SOAJ injection Inject 0.3 mg into the muscle once.   rescue  . metoCLOPramide (REGLAN) 10 MG tablet Take 1 tablet (10 mg total) by mouth every 6 (six) hours. (Patient not taking: Reported on 08/13/2015) 30 tablet 0 06/11/2015 at Unknown time  . metroNIDAZOLE (METROGEL VAGINAL) 0.75 % vaginal gel Place 1 Applicatorful vaginally 2 (two) times daily. (Patient not taking: Reported on 08/13/2015) 70 g 0 Past Week at Unknown time  . ondansetron (ZOFRAN-ODT) 8 MG disintegrating tablet Take 1 tablet (8 mg total) by mouth every 8 (eight) hours as needed for nausea or vomiting. (Patient not taking: Reported on 08/13/2015) 20 tablet 0     ROS:  Review of Systems  Constitutional: Negative for fever and chills.  Cardiovascular: Negative for chest pain.  Gastrointestinal: Positive for nausea, vomiting and abdominal pain. Negative for diarrhea and constipation.  Genitourinary: Negative for dysuria, vaginal bleeding and difficulty urinating.  Musculoskeletal: Negative for myalgias and back pain.  Neurological: Negative for dizziness.   I have reviewed patient's Past Medical Hx, Surgical Hx, Family Hx, Social Hx, medications and allergies.   Physical Exam  Patient Vitals for the past 24 hrs:  BP Temp Temp src Pulse Resp Height Weight  08/13/15 1131 113/65 mmHg 98.5 F (36.9 C) Oral 85 20  (1.676 m) 210 lb (95.255 kg)   Constitutional: Well-developed, well-nourished female in no acute distress.  Cardiovascular: normal rate Respiratory: normal effort GI: Abd soft, Tender over RLQ, no rebound or guarding, gravid appropriate for gestational age.  MS: Extremities nontender, no edema, normal ROM Neurologic: Alert and oriented x 4.  GU: Neg CVAT.  Bimanual exam: Cervix 0/long/high, firm, anterior, neg CMT, uterus nontender, adnexa without tenderness, enlargement, or mass     FHT: 144  Contractions: None   Labs: No results found for this or any previous visit (from the past 24 hour(s)). --/--/O  POS (09/08 2105)  Imaging:  No results found.  MAU Course/MDM: I have ordered labs and reviewed results.   Consult Dr Ambrose Mantle with presentation, exam findings and lab results.   Treatments in MAU included IV hydration with administration of Phenergan and MVI.   She did well and was tolerating POs by time of discharge Pt stable at time of discharge.  Assessment:  SIUP at [redacted]w[redacted]d  Nausea and vomiting of pregnancy  Plan: Discharge home Advance diet as tolerated Continue Phenergan suppositories (Did not use them today) and other meds as needed Followup in office for prenatal visits    Medication List    ASK your doctor about these medications        albuterol 108 (90 BASE) MCG/ACT inhaler  Commonly known as:  PROVENTIL  HFA;VENTOLIN HFA  Inhale 1-2 puffs into the lungs every 6 (six) hours as needed for wheezing or shortness of breath.     cephALEXin 500 MG capsule  Commonly known as:  KEFLEX  Take 1 capsule (500 mg total) by mouth 4 (four) times daily.     Doxylamine-Pyridoxine 10-10 MG Tbec  Take 2 tablets by mouth at bedtime. Take 2 tablets at bedtime. If symptoms persist, add 1 tab in the AM starting on day 3. If symptoms persist, add 1 tab in the PM starting day 4.     EPINEPHrine 0.3 mg/0.3 mL Soaj injection  Commonly known as:  EPI-PEN  Inject 0.3 mg into the muscle once.     metoCLOPramide 10 MG tablet  Commonly known as:  REGLAN  Take 1 tablet (10 mg total) by mouth every 6 (six) hours.     metroNIDAZOLE 0.75 % vaginal gel  Commonly known as:  METROGEL VAGINAL  Place 1 Applicatorful vaginally 2 (two) times daily.     ondansetron 8 MG disintegrating tablet  Commonly known as:  ZOFRAN-ODT  Take 1 tablet (8 mg total) by mouth every 8 (eight) hours as needed for nausea or vomiting.     Prenatal Vitamins 0.8 MG tablet  Take 1 tablet by mouth daily.     promethazine 25 MG tablet  Commonly known as:  PHENERGAN  Take 1 tablet (25 mg total) by mouth every 6 (six)  hours as needed for nausea or vomiting.     promethazine 25 MG suppository  Commonly known as:  PHENERGAN  Place 1 suppository (25 mg total) rectally every 6 (six) hours as needed for nausea or vomiting.        Wynelle Bourgeois CNM, MSN Certified Nurse-Midwife 08/13/2015 12:27 PM

## 2015-08-13 NOTE — MAU Note (Signed)
Pt presents to MAU with complaints of nausea and vomiting since yesterday with pelvic pain. Denies any vaginal bleeding or abdnormal discharge

## 2015-09-04 ENCOUNTER — Encounter (HOSPITAL_COMMUNITY): Payer: Self-pay

## 2015-09-04 ENCOUNTER — Inpatient Hospital Stay (HOSPITAL_COMMUNITY)
Admission: AD | Admit: 2015-09-04 | Discharge: 2015-09-04 | Disposition: A | Discharge status: Home / Self Care | Payer: Medicaid Other | Source: Ambulatory Visit | Attending: Obstetrics and Gynecology | Admitting: Obstetrics and Gynecology

## 2015-09-04 DIAGNOSIS — Z3A23 23 weeks gestation of pregnancy: Secondary | ICD-10-CM | POA: Diagnosis not present

## 2015-09-04 DIAGNOSIS — O219 Vomiting of pregnancy, unspecified: Secondary | ICD-10-CM

## 2015-09-04 DIAGNOSIS — A084 Viral intestinal infection, unspecified: Secondary | ICD-10-CM

## 2015-09-04 DIAGNOSIS — O212 Late vomiting of pregnancy: Secondary | ICD-10-CM | POA: Insufficient documentation

## 2015-09-04 DIAGNOSIS — O99612 Diseases of the digestive system complicating pregnancy, second trimester: Secondary | ICD-10-CM | POA: Diagnosis not present

## 2015-09-04 LAB — COMPREHENSIVE METABOLIC PANEL
ALBUMIN: 3.3 g/dL — AB (ref 3.5–5.0)
ALT: 10 U/L — AB (ref 14–54)
AST: 18 U/L (ref 15–41)
Alkaline Phosphatase: 53 U/L (ref 38–126)
Anion gap: 10 (ref 5–15)
CHLORIDE: 102 mmol/L (ref 101–111)
CO2: 23 mmol/L (ref 22–32)
CREATININE: 0.37 mg/dL — AB (ref 0.44–1.00)
Calcium: 8.9 mg/dL (ref 8.9–10.3)
GFR calc Af Amer: >60 mL/min (ref >60)
GLUCOSE: 77 mg/dL (ref 65–99)
POTASSIUM: 3.4 mmol/L — AB (ref 3.5–5.1)
SODIUM: 135 mmol/L (ref 135–145)
Total Bilirubin: 0.6 mg/dL (ref 0.3–1.2)
Total Protein: 7.6 g/dL (ref 6.5–8.1)

## 2015-09-04 LAB — CBC
HCT: 32.6 % — ABNORMAL LOW (ref 36.0–46.0)
Hemoglobin: 11 g/dL — ABNORMAL LOW (ref 12.0–15.0)
MCH: 28.1 pg (ref 26.0–34.0)
MCHC: 33.7 g/dL (ref 30.0–36.0)
MCV: 83.4 fL (ref 78.0–100.0)
PLATELETS: 304 10*3/uL (ref 150–400)
RBC: 3.91 MIL/uL (ref 3.87–5.11)
RDW: 14.2 % (ref 11.5–15.5)
WBC: 5 10*3/uL (ref 4.0–10.5)

## 2015-09-04 MED ORDER — LACTATED RINGERS IV BOLUS (SEPSIS)
1000.0000 mL | Freq: Once | INTRAVENOUS | Status: AC
  Administered 2015-09-04: 1000 mL via INTRAVENOUS

## 2015-09-04 MED ORDER — GLYCOPYRROLATE 0.2 MG/ML IJ SOLN
0.1000 mg | Freq: Once | INTRAMUSCULAR | Status: AC
  Administered 2015-09-04: 0.1 mg via INTRAVENOUS
  Filled 2015-09-04: qty 0.5

## 2015-09-04 MED ORDER — ONDANSETRON HCL 4 MG/2ML IJ SOLN
4.0000 mg | Freq: Once | INTRAMUSCULAR | Status: AC
  Administered 2015-09-04: 4 mg via INTRAVENOUS
  Filled 2015-09-04: qty 2

## 2015-09-04 MED ORDER — GLYCOPYRROLATE 1 MG PO TABS: 1.0000 mg | ORAL_TABLET | Freq: Three times a day (TID) | ORAL | Status: DC

## 2015-09-04 NOTE — MAU Provider Note (Signed)
First Provider Initiated Contact with Patient 09/04/15 1940       Chief Complaint  Patient presents with  . Emesis  . Diarrhea   HPI  [name] is a 24 y.o. G3P1011 at 4453w4d who presents to MAU today with complaint of N/V/D today. She states that N/V has been an issues throughout the pregnancy. She states that diarrhea has been present today. She has tried Diclegis and Phenergan PO and suppositories without relief. She states that her daughter has also had N/V today. She denies vaginal bleeding, LOF or fever. She does endorse occasional mild contractions and cramping in the lower abdomen after vomiting. She reports normal fetal movement.   OB History    Gravida Para Term Preterm AB TAB SAB Ectopic Multiple Living   3 1 1  1  1   1       Past Medical History  Diagnosis Date  . BV (bacterial vaginosis)   . Gonorrhea   . Chlamydia   . ADHD (attention deficit hyperactivity disorder)     since childhood  . Hyperemesis     G3  . Asthma     inhaler last used prior to pregnancy  . GERD (gastroesophageal reflux disease)     Past Surgical History  Procedure Laterality Date  . No past surgeries      Family History  Problem Relation Age of Onset  . Cervical cancer Paternal Aunt   . Hypertension Father   . Congestive Heart Failure Maternal Grandmother   . Hypertension Mother     Social History  Substance Use Topics  . Smoking status: Never Smoker   . Smokeless tobacco: Never Used  . Alcohol Use: Yes     Comment: not while pregnant     Allergies:  Allergies  Allergen Reactions  . Shellfish Allergy Anaphylaxis  . Adhesive [Tape] Swelling and Other (See Comments)    Reaction:  Bruising   . Latex Hives  . Betadine [Povidone Iodine] Rash    Prescriptions prior to admission  Medication Sig Dispense Refill Last Dose  . acetaminophen (TYLENOL) 325 MG tablet Take 325-650 mg by mouth every 6 (six) hours as needed for headache.   Past Month at Unknown time  .  diphenhydrAMINE (BENADRYL) 25 MG tablet Take 25 mg by mouth every 6 (six) hours as needed for allergies.   Past Month at Unknown time  . Doxylamine-Pyridoxine 10-10 MG TBEC Take 2 tablets by mouth at bedtime. Take 2 tablets at bedtime. If symptoms persist, add 1 tab in the AM starting on day 3. If symptoms persist, add 1 tab in the PM starting day 4. 90 tablet 1 09/04/2015 at Unknown time  . metroNIDAZOLE (METROGEL VAGINAL) 0.75 % vaginal gel Place 1 Applicatorful vaginally 2 (two) times daily. 70 g 0 Past Week at Unknown time  . ondansetron (ZOFRAN-ODT) 8 MG disintegrating tablet Take 1 tablet (8 mg total) by mouth every 8 (eight) hours as needed for nausea or vomiting. 20 tablet 0 Past Month at Unknown time  . Prenatal Multivit-Min-Fe-FA (PRENATAL VITAMINS) 0.8 MG tablet Take 1 tablet by mouth daily. 30 tablet 12 Past Week at Unknown time  . promethazine (PHENERGAN) 25 MG suppository Place 1 suppository (25 mg total) rectally every 6 (six) hours as needed for nausea or vomiting. 12 suppository 1 09/04/2015 at Unknown time  . promethazine (PHENERGAN) 25 MG tablet Take 1 tablet (25 mg total) by mouth every 6 (six) hours as needed for nausea or vomiting.  30 tablet 2 09/04/2015 at Unknown time  . albuterol (PROVENTIL HFA;VENTOLIN HFA) 108 (90 BASE) MCG/ACT inhaler Inhale 1-2 puffs into the lungs every 6 (six) hours as needed for wheezing or shortness of breath.   Rescue  . cephALEXin (KEFLEX) 500 MG capsule Take 1 capsule (500 mg total) by mouth 4 (four) times daily. (Patient not taking: Reported on 08/13/2015) 28 capsule 0 Not Taking at Unknown time  . EPINEPHrine 0.3 mg/0.3 mL IJ SOAJ injection Inject 0.3 mg into the muscle once.   Emergency  . metoCLOPramide (REGLAN) 10 MG tablet Take 1 tablet (10 mg total) by mouth every 6 (six) hours. (Patient not taking: Reported on 08/13/2015) 30 tablet 0 Not Taking at Unknown time    Review of Systems  Constitutional: Negative for fever and malaise/fatigue.   Gastrointestinal: Positive for nausea, vomiting, abdominal pain and diarrhea. Negative for constipation.  Genitourinary:       Neg - vaginal bleeding, discharge, LOF   Physical Exam   Blood pressure 114/68, pulse 105, temperature 97.8 F (36.6 C), resp. rate 18, height  (1.727 m), weight 207 lb (93.895 kg), last menstrual period 03/06/2015, unknown if currently breastfeeding.  Physical Exam  Nursing note and vitals reviewed. Constitutional: She is oriented to person, place, and time. She appears well-developed and well-nourished. No distress.  HENT:  Head: Normocephalic and atraumatic.  Cardiovascular: Normal rate.   Respiratory: Effort normal.  GI: Soft. She exhibits no distension and no mass. There is no tenderness. There is no rebound and no guarding.  Neurological: She is alert and oriented to person, place, and time.  Skin: Skin is warm and dry. No erythema.  Psychiatric: She has a normal mood and affect.    Results for orders placed or performed during the hospital encounter of 09/04/15 (from the past 24 hour(s))  CBC     Status: Abnormal   Collection Time: 09/04/15  8:00 PM  Result Value Ref Range   WBC 5.0 4.0 - 10.5 K/uL   RBC 3.91 3.87 - 5.11 MIL/uL   Hemoglobin 11.0 (L) 12.0 - 15.0 g/dL   HCT 16.1 (L) 09.6 - 04.5 %   MCV 83.4 78.0 - 100.0 fL   MCH 28.1 26.0 - 34.0 pg   MCHC 33.7 30.0 - 36.0 g/dL   RDW 40.9 81.1 - 91.4 %   Platelets 304 150 - 400 K/uL  Comprehensive metabolic panel     Status: Abnormal   Collection Time: 09/04/15  8:00 PM  Result Value Ref Range   Sodium 135 135 - 145 mmol/L   Potassium 3.4 (L) 3.5 - 5.1 mmol/L   Chloride 102 101 - 111 mmol/L   CO2 23 22 - 32 mmol/L   Glucose, Bld 77 65 - 99 mg/dL   BUN <5 (L) 6 - 20 mg/dL   Creatinine, Ser 7.82 (L) 0.44 - 1.00 mg/dL   Calcium 8.9 8.9 - 95.6 mg/dL   Total Protein 7.6 6.5 - 8.1 g/dL   Albumin 3.3 (L) 3.5 - 5.0 g/dL   AST 18 15 - 41 U/L   ALT 10 (L) 14 - 54 U/L   Alkaline Phosphatase  53 38 - 126 U/L   Total Bilirubin 0.6 0.3 - 1.2 mg/dL   GFR calc non Af Amer >60 >60 mL/min   GFR calc Af Amer >60 >60 mL/min   Anion gap 10 5 - 15    Fetal Monitoring: Baseline: 140 bpm, moderate variability, + accelerations, no decelerations Contractions: none  MAU  Course  Procedures None  MDM UA, CBC, CMP ordered Patient unable to give urine sample upon arrival in MAU IV LR with Zofran and Rubinol given Discussed patient with Dr. Ambrose Mantle. He agrees with plan for labs, IV fluids and anti-emetics. Also recommends if patient has a stool while in MAU we should sent for culture, C. Diff and O&P.  2030 - Labs pending. Patient receiving IV fluids and anti-emetics. Care turned over to [facility], CNM 2057: Patient is tolerating PO, and reports feeling better at this time.   [name], PA-C  09/04/2015, 8:27 PM  Assessment and Plan    1. Viral gastroenteritis   2. Nausea and vomiting during pregnancy prior to [redacted] weeks gestation   3. [redacted] weeks gestation of pregnancy    DC home Comfort measures reviewed  2nd Trimester precautions  Discussed K rich foods.  PTL precautions  Fetal kick counts RX: Robinul  TID #[id number]  Return to MAU as needed FU with OB as planned  Follow-up Information    Follow up with Bing Plume, MD.   Specialty:  Obstetrics and Gynecology   Why:  As scheduled   Contact information:   [address] [place] [address] [phone]

## 2015-09-04 NOTE — Discharge Instructions (Signed)
Potassium Content of Foods Potassium is a mineral found in many foods and drinks. It helps keep fluids and minerals balanced in your body and affects how steadily your heart beats. Potassium also helps control your blood pressure and keep your muscles and nervous system healthy. Certain health conditions and medicines may change the balance of potassium in your body. When this happens, you can help balance your level of potassium through the foods that you do or do not eat. Your health care provider or dietitian may recommend an amount of potassium that you should have each day. The following lists of foods provide the amount of potassium (in parentheses) per serving in each item. HIGH IN POTASSIUM  The following foods and beverages have 200 mg or more of potassium per serving:  Apricots, 2 raw or 5 dry (200 mg).  Artichoke, 1 medium (345 mg).  Avocado, raw,  each (245 mg).  Banana, 1 medium (425 mg).  Beans, lima, or baked beans, canned,  cup (280 mg).  Beans, white, canned,  cup (595 mg).  Beef roast, 3 oz (320 mg).  Beef, ground, 3 oz (270 mg).  Beets, raw or cooked,  cup (260 mg).  Bran muffin, 2 oz (300 mg).  Broccoli,  cup (230 mg).  Brussels sprouts,  cup (250 mg).  Cantaloupe,  cup (215 mg).  Cereal, 100% bran,  cup (200-400 mg).  Cheeseburger, single, fast food, 1 each (225-400 mg).  Chicken, 3 oz (220 mg).  Clams, canned, 3 oz (535 mg).  Crab, 3 oz (225 mg).  Dates, 5 each (270 mg).  Dried beans and peas,  cup (300-475 mg).  Figs, dried, 2 each (260 mg).  Fish: halibut, tuna, cod, snapper, 3 oz (480 mg).  Fish: salmon, haddock, swordfish, perch, 3 oz (300 mg).  Fish, tuna, canned 3 oz (200 mg).  [place] fries, fast food, 3 oz (470 mg).  Granola with fruit and nuts,  cup (200 mg).  Grapefruit juice,  cup (200 mg).  Greens, beet,  cup (655 mg).  Honeydew melon,  cup (200 mg).  Kale, raw, 1 cup (300 mg).  Kiwi, 1 medium (240  mg).  Kohlrabi, rutabaga, parsnips,  cup (280 mg).  Lentils,  cup (365 mg).  Mango, 1 each (325 mg).  Milk, chocolate, 1 cup (420 mg).  Milk: nonfat, low-fat, whole, buttermilk, 1 cup (350-380 mg).  Molasses, 1 Tbsp (295 mg).  Mushrooms,  cup (280) mg.  Nectarine, 1 each (275 mg).  Nuts: almonds, peanuts, hazelnuts, [place], cashew, mixed, 1 oz (200 mg).  Nuts, pistachios, 1 oz (295 mg).  Orange, 1 each (240 mg).  Orange juice,  cup (235 mg).  Papaya, medium,  fruit (390 mg).  Peanut butter, chunky, 2 Tbsp (240 mg).  Peanut butter, smooth, 2 Tbsp (210 mg).  Pear, 1 medium (200 mg).  Pomegranate, 1 whole (400 mg).  Pomegranate juice,  cup (215 mg).  Pork, 3 oz (350 mg).  Potato chips, salted, 1 oz (465 mg).  Potato, baked with skin, 1 medium (925 mg).  Potatoes, boiled,  cup (255 mg).  Potatoes, mashed,  cup (330 mg).  Prune juice,  cup (370 mg).  Prunes, 5 each (305 mg).  Pudding, chocolate,  cup (230 mg).  Pumpkin, canned,  cup (250 mg).  Raisins, seedless,  cup (270 mg).  Seeds, sunflower or pumpkin, 1 oz (240 mg).  Soy milk, 1 cup (300 mg).  Spinach,  cup (420 mg).  Spinach, canned,  cup (370 mg).  Sweet potato, baked with skin, 1 medium (450 mg).  Swiss chard,  cup (480 mg).  Tomato or vegetable juice,  cup (275 mg).  Tomato sauce or puree,  cup (400-550 mg).  Tomato, raw, 1 medium (290 mg).  Tomatoes, canned,  cup (200-300 mg).  [place], 3 oz (250 mg).  Wheat germ, 1 oz (250 mg).  Winter squash,  cup (250 mg).  Yogurt, plain or fruited, 6 oz (260-435 mg).  Zucchini,  cup (220 mg). MODERATE IN POTASSIUM The following foods and beverages have 50-200 mg of potassium per serving:  Apple, 1 each (150 mg).  Apple juice,  cup (150 mg).  Applesauce,  cup (90 mg).  Apricot nectar,  cup (140 mg).  Asparagus, small spears,  cup or 6 spears (155 mg).  Bagel, cinnamon raisin, 1 each (130 mg).  Bagel,  egg or plain, 4 in., 1 each (70 mg).  Beans, green,  cup (90 mg).  Beans, yellow,  cup (190 mg).  Beer, regular, 12 oz (100 mg).  Beets, canned,  cup (125 mg).  Blackberries,  cup (115 mg).  Blueberries,  cup (60 mg).  Bread, whole wheat, 1 slice (70 mg).  Broccoli, raw,  cup (145 mg).  Cabbage,  cup (150 mg).  Carrots, cooked or raw,  cup (180 mg).  Cauliflower, raw,  cup (150 mg).  Celery, raw,  cup (155 mg).  Cereal, bran flakes, cup (120-150 mg).  Cheese, cottage,  cup (110 mg).  Cherries, 10 each (150 mg).  Chocolate, 1 oz bar (165 mg).  Coffee, brewed 6 oz (90 mg).  Corn,  cup or 1 ear (195 mg).  Cucumbers,  cup (80 mg).  Egg, large, 1 each (60 mg).  Eggplant,  cup (60 mg).  Endive, raw, cup (80 mg).  English muffin, 1 each (65 mg).  Fish, orange roughy, 3 oz (150 mg).  Frankfurter, beef or pork, 1 each (75 mg).  Fruit cocktail,  cup (115 mg).  Grape juice,  cup (170 mg).  Grapefruit,  fruit (175 mg).  Grapes,  cup (155 mg).  Greens: kale, turnip, collard,  cup (110-150 mg).  Ice cream or frozen yogurt, chocolate,  cup (175 mg).  Ice cream or frozen yogurt, vanilla,  cup (120-150 mg).  Lemons, limes, 1 each (80 mg).  Lettuce, all types, 1 cup (100 mg).  Mixed vegetables,  cup (150 mg).  Mushrooms, raw,  cup (110 mg).  Nuts: walnuts, pecans, or macadamia, 1 oz (125 mg).  Oatmeal,  cup (80 mg).  Okra,  cup (110 mg).  Onions, raw,  cup (120 mg).  Peach, 1 each (185 mg).  Peaches, canned,  cup (120 mg).  Pears, canned,  cup (120 mg).  Peas, green, frozen,  cup (90 mg).  Peppers, green,  cup (130 mg).  Peppers, red,  cup (160 mg).  Pineapple juice,  cup (165 mg).  Pineapple, fresh or canned,  cup (100 mg).  Plums, 1 each (105 mg).  Pudding, vanilla,  cup (150 mg).  Raspberries,  cup (90 mg).  Rhubarb,  cup (115 mg).  Rice, wild,  cup (80 mg).  Shrimp, 3 oz (155  mg).  Spinach, raw, 1 cup (170 mg).  Strawberries,  cup (125 mg).  Summer squash  cup (175-200 mg).  Swiss chard, raw, 1 cup (135 mg).  Tangerines, 1 each (140 mg).  Tea, brewed, 6 oz (65 mg).  Turnips,  cup (140 mg).  Watermelon,  cup (85 mg).  Wine, red, table,  5 oz (180 mg).  Wine, white, table, 5 oz (100 mg). LOW IN POTASSIUM The following foods and beverages have less than 50 mg of potassium per serving.  Bread, white, 1 slice (30 mg).  Carbonated beverages, 12 oz (less than 5 mg).  Cheese, 1 oz (20-30 mg).  Cranberries,  cup (45 mg).  Cranberry juice cocktail,  cup (20 mg).  Fats and oils, 1 Tbsp (less than 5 mg).  Hummus, 1 Tbsp (32 mg).  Nectar: papaya, mango, or pear,  cup (35 mg).  Rice, white or brown,  cup (50 mg).  Spaghetti or macaroni,  cup cooked (30 mg).  Tortilla, flour or corn, 1 each (50 mg).  Waffle, 4 in., 1 each (50 mg).  Water chestnuts,  cup (40 mg).   This information is not intended to replace advice given to you by your health care provider. Make sure you discuss any questions you have with your health care provider.   Document Released: 05/04/2005 Document Revised: 09/25/2013 Document Reviewed: 08/17/2013 Elsevier Interactive Patient Education 2016 [employer]   Food Choices to Help Relieve Diarrhea, Adult When you have diarrhea, the foods you eat and your eating habits are very important. Choosing the right foods and drinks can help relieve diarrhea. Also, because diarrhea can last up to 7 days, you need to replace lost fluids and electrolytes (such as sodium, potassium, and chloride) in order to help prevent dehydration.  WHAT GENERAL GUIDELINES DO I NEED TO FOLLOW?  Slowly drink 1 cup (8 oz) of fluid for each episode of diarrhea. If you are getting enough fluid, your urine will be clear or pale yellow.  Eat starchy foods. Some good choices include white rice, white toast, pasta, low-fiber cereal, baked potatoes  (without the skin), saltine crackers, and bagels.  Avoid large servings of any cooked vegetables.  Limit fruit to two servings per day. A serving is  cup or 1 small piece.  Choose foods with less than 2 g of fiber per serving.  Limit fats to less than 8 tsp (38 g) per day.  Avoid fried foods.  Eat foods that have probiotics in them. Probiotics can be found in certain dairy products.  Avoid foods and beverages that may increase the speed at which food moves through the stomach and intestines (gastrointestinal tract). Things to avoid include:  High-fiber foods, such as dried fruit, raw fruits and vegetables, nuts, seeds, and whole grain foods.  Spicy foods and high-fat foods.  Foods and beverages sweetened with high-fructose corn syrup, honey, or sugar alcohols such as xylitol, sorbitol, and mannitol. WHAT FOODS ARE RECOMMENDED? Grains White rice. White, [place], or pita breads (fresh or toasted), including plain rolls, buns, or bagels. White pasta. Saltine, soda, or graham crackers. Pretzels. Low-fiber cereal. Cooked cereals made with water (such as cornmeal, farina, or cream cereals). Plain muffins. Matzo. Melba toast. Zwieback.  Vegetables Potatoes (without the skin). Strained tomato and vegetable juices. Most well-cooked and canned vegetables without seeds. Tender lettuce. Fruits Cooked or canned applesauce, apricots, cherries, fruit cocktail, grapefruit, peaches, pears, or plums. Fresh bananas, apples without skin, cherries, grapes, cantaloupe, grapefruit, peaches, oranges, or plums.  Meat and Other Protein Products Baked or boiled chicken. Eggs. Tofu. Fish. Seafood. Smooth peanut butter. Ground or well-cooked tender beef, ham, veal, lamb, pork, or poultry.  Dairy Plain yogurt, kefir, and unsweetened liquid yogurt. Lactose-free milk, buttermilk, or soy milk. Plain hard cheese. Beverages Sport drinks. Clear broths. Diluted fruit juices (except prune). Regular, caffeine-free sodas  such  as ginger ale. Water. Decaffeinated teas. Oral rehydration solutions. Sugar-free beverages not sweetened with sugar alcohols. Other Bouillon, broth, or soups made from recommended foods.  The items listed above may not be a complete list of recommended foods or beverages. Contact your dietitian for more options. WHAT FOODS ARE NOT RECOMMENDED? Grains Whole grain, whole wheat, bran, or rye breads, rolls, pastas, crackers, and cereals. Wild or brown rice. Cereals that contain more than 2 g of fiber per serving. Corn tortillas or taco shells. Cooked or dry oatmeal. Granola. Popcorn. Vegetables Raw vegetables. Cabbage, broccoli, Brussels sprouts, artichokes, baked beans, beet greens, corn, kale, legumes, peas, sweet potatoes, and yams. Potato skins. Cooked spinach and cabbage. Fruits Dried fruit, including raisins and dates. Raw fruits. Stewed or dried prunes. Fresh apples with skin, apricots, mangoes, pears, raspberries, and strawberries.  Meat and Other Protein Products Chunky peanut butter. Nuts and seeds. Beans and lentils. Tomasa Blase.  Dairy High-fat cheeses. Milk, chocolate milk, and beverages made with milk, such as milk shakes. Cream. Ice cream. Sweets and Desserts Sweet rolls, doughnuts, and sweet breads. Pancakes and waffles. Fats and Oils Butter. Cream sauces. Margarine. Salad oils. Plain salad dressings. Olives. Avocados.  Beverages Caffeinated beverages (such as coffee, tea, soda, or energy drinks). Alcoholic beverages. Fruit juices with pulp. Prune juice. Soft drinks sweetened with high-fructose corn syrup or sugar alcohols. Other Coconut. Hot sauce. Chili powder. Mayonnaise. Gravy. Cream-based or milk-based soups.  The items listed above may not be a complete list of foods and beverages to avoid. Contact your dietitian for more information. WHAT SHOULD I DO IF I BECOME DEHYDRATED? Diarrhea can sometimes lead to dehydration. Signs of dehydration include dark urine and dry mouth and  skin. If you think you are dehydrated, you should rehydrate with an oral rehydration solution. These solutions can be purchased at pharmacies, retail stores, or online.  Drink -1 cup (120-240 mL) of oral rehydration solution each time you have an episode of diarrhea. If drinking this amount makes your diarrhea worse, try drinking smaller amounts more often. For example, drink 1-3 tsp (5-15 mL) every 5-10 minutes.  A general rule for staying hydrated is to drink 1-2 L of fluid per day. Talk to your health care provider about the specific amount you should be drinking each day. Drink enough fluids to keep your urine clear or pale yellow.   This information is not intended to replace advice given to you by your health care provider. Make sure you discuss any questions you have with your health care provider.   Document Released: 12/11/2003 Document Revised: 10/11/2014 Document Reviewed: 08/13/2013 Elsevier Interactive Patient Education [employer]

## 2015-09-04 NOTE — MAU Note (Signed)
Pt presents to MAU with complaints of nausea, vomiting and diarrhea for 2 days. Denies any vaginal bleeding

## 2015-09-26 ENCOUNTER — Encounter (HOSPITAL_COMMUNITY): Payer: Self-pay

## 2015-09-26 ENCOUNTER — Inpatient Hospital Stay (EMERGENCY_DEPARTMENT_HOSPITAL)
Admission: AD | Admit: 2015-09-26 | Discharge: 2015-09-26 | Disposition: A | Discharge status: Home / Self Care | Payer: Medicaid Other | Source: Ambulatory Visit | Attending: Obstetrics and Gynecology | Admitting: Obstetrics and Gynecology

## 2015-09-26 ENCOUNTER — Inpatient Hospital Stay (HOSPITAL_COMMUNITY)
Admission: AD | Admit: 2015-09-26 | Discharge: 2015-09-26 | Disposition: A | Discharge status: Home / Self Care | Payer: Medicaid Other | Source: Ambulatory Visit | Attending: Obstetrics and Gynecology | Admitting: Obstetrics and Gynecology

## 2015-09-26 DIAGNOSIS — R109 Unspecified abdominal pain: Secondary | ICD-10-CM | POA: Insufficient documentation

## 2015-09-26 DIAGNOSIS — O99512 Diseases of the respiratory system complicating pregnancy, second trimester: Secondary | ICD-10-CM | POA: Diagnosis not present

## 2015-09-26 DIAGNOSIS — O219 Vomiting of pregnancy, unspecified: Secondary | ICD-10-CM | POA: Diagnosis not present

## 2015-09-26 DIAGNOSIS — O9989 Other specified diseases and conditions complicating pregnancy, childbirth and the puerperium: Secondary | ICD-10-CM | POA: Insufficient documentation

## 2015-09-26 DIAGNOSIS — J45909 Unspecified asthma, uncomplicated: Secondary | ICD-10-CM | POA: Insufficient documentation

## 2015-09-26 DIAGNOSIS — N393 Stress incontinence (female) (male): Secondary | ICD-10-CM

## 2015-09-26 DIAGNOSIS — O212 Late vomiting of pregnancy: Secondary | ICD-10-CM | POA: Insufficient documentation

## 2015-09-26 DIAGNOSIS — Z8249 Family history of ischemic heart disease and other diseases of the circulatory system: Secondary | ICD-10-CM | POA: Diagnosis not present

## 2015-09-26 DIAGNOSIS — R110 Nausea: Secondary | ICD-10-CM | POA: Diagnosis present

## 2015-09-26 DIAGNOSIS — Z91040 Latex allergy status: Secondary | ICD-10-CM | POA: Insufficient documentation

## 2015-09-26 DIAGNOSIS — Z3A26 26 weeks gestation of pregnancy: Secondary | ICD-10-CM | POA: Diagnosis not present

## 2015-09-26 DIAGNOSIS — O99891 Other specified diseases and conditions complicating pregnancy: Secondary | ICD-10-CM

## 2015-09-26 LAB — COMPREHENSIVE METABOLIC PANEL
ALK PHOS: 64 U/L (ref 38–126)
ALT: 10 U/L — AB (ref 14–54)
AST: 17 U/L (ref 15–41)
Albumin: 2.9 g/dL — ABNORMAL LOW (ref 3.5–5.0)
Anion gap: 9 (ref 5–15)
CHLORIDE: 102 mmol/L (ref 101–111)
CO2: 21 mmol/L — AB (ref 22–32)
CREATININE: 0.31 mg/dL — AB (ref 0.44–1.00)
Calcium: 8.4 mg/dL — ABNORMAL LOW (ref 8.9–10.3)
GFR calc Af Amer: >60 mL/min (ref >60)
Glucose, Bld: 72 mg/dL (ref 65–99)
Potassium: 3.8 mmol/L (ref 3.5–5.1)
Sodium: 132 mmol/L — ABNORMAL LOW (ref 135–145)
Total Bilirubin: 0.5 mg/dL (ref 0.3–1.2)
Total Protein: 6.4 g/dL — ABNORMAL LOW (ref 6.5–8.1)

## 2015-09-26 LAB — CBC
HCT: 29.3 % — ABNORMAL LOW (ref 36.0–46.0)
HEMOGLOBIN: 9.8 g/dL — AB (ref 12.0–15.0)
MCH: 28.4 pg (ref 26.0–34.0)
MCHC: 33.4 g/dL (ref 30.0–36.0)
MCV: 84.9 fL (ref 78.0–100.0)
PLATELETS: 305 10*3/uL (ref 150–400)
RBC: 3.45 MIL/uL — AB (ref 3.87–5.11)
RDW: 13.6 % (ref 11.5–15.5)
WBC: 6.4 10*3/uL (ref 4.0–10.5)

## 2015-09-26 LAB — LIPASE, BLOOD: Lipase: 22 U/L (ref 11–51)

## 2015-09-26 LAB — URINALYSIS, ROUTINE W REFLEX MICROSCOPIC
BILIRUBIN URINE: NEGATIVE
Glucose, UA: NEGATIVE mg/dL
Hgb urine dipstick: NEGATIVE
Ketones, ur: NEGATIVE mg/dL
LEUKOCYTES UA: NEGATIVE
NITRITE: NEGATIVE
Protein, ur: NEGATIVE mg/dL
pH: 6 (ref 5.0–8.0)

## 2015-09-26 LAB — AMYLASE: AMYLASE: 63 U/L (ref 28–100)

## 2015-09-26 LAB — AMNISURE RUPTURE OF MEMBRANE (ROM) NOT AT ARMC: AMNISURE: NEGATIVE

## 2015-09-26 MED ORDER — LACTATED RINGERS IV BOLUS (SEPSIS)
1000.0000 mL | Freq: Once | INTRAVENOUS | Status: AC
  Administered 2015-09-26: 1000 mL via INTRAVENOUS

## 2015-09-26 MED ORDER — PROMETHAZINE HCL 25 MG/ML IJ SOLN
25.0000 mg | Freq: Once | INTRAMUSCULAR | Status: AC
  Administered 2015-09-26: 25 mg via INTRAVENOUS
  Filled 2015-09-26: qty 1

## 2015-09-26 MED ORDER — M.V.I. ADULT IV INJ
Freq: Once | INTRAVENOUS | Status: AC
  Administered 2015-09-26: 12:00:00 via INTRAVENOUS
  Filled 2015-09-26: qty 10

## 2015-09-26 MED ORDER — GLYCOPYRROLATE 0.2 MG/ML IJ SOLN
0.1000 mg | Freq: Once | INTRAMUSCULAR | Status: AC
  Administered 2015-09-26: 0.1 mg via INTRAVENOUS
  Filled 2015-09-26: qty 0.5

## 2015-09-26 NOTE — MAU Provider Note (Signed)
History     CSN: [id number]  Arrival date and time: 09/26/15 4098   First Provider Initiated Contact with Patient 09/26/15 0945    Chief Complaint  Patient presents with  . Nausea  . Emesis During Pregnancy  . Abdominal Pain   HPI  Deanna Myers is a 24 y.o. G3P1011 at [redacted]w[redacted]d who presents with n/v & abdominal pain.  Reports continued n/v throughout pregnancy. Has vomited 8 times since last night. Attempted to take 2 phenergan this morning at 4 am but was unable to keep them down. Emesis is yellow & undigested food when she attempts to eat. Last ate soup last night, was unable to keep down.  Denies diarrhea. Reports constipation up until 2 days ago when she took a laxative. Last BM was yesterday & normal.  Denies heartburn.  Also reports mid abdominal pain that comes & goes x 2 weeks. Describes it as a sharp, pinching pain that she rates as 7/10. It occurs 3 times per day & lasts for 5 minutes at a time. No treatment. Denies pain at this time. States it doesn't feel like labor.  Denies vaginal bleeding, LOF, or discharge. Denies recent intercourse.  Reports positive fetal movement.   OB History    Gravida Para Term Preterm AB TAB SAB Ectopic Multiple Living   Past Medical History  Diagnosis Date  . BV (bacterial vaginosis)   . Gonorrhea   . Chlamydia   . ADHD (attention deficit hyperactivity disorder)     since childhood  . Hyperemesis     G3  . Asthma     inhaler last used prior to pregnancy  . GERD (gastroesophageal reflux disease)     Past Surgical History  Procedure Laterality Date  . No past surgeries      Family History  Problem Relation Age of Onset  . Cervical cancer Paternal Aunt   . Hypertension Father   . Congestive Heart Failure Maternal Grandmother   . Hypertension Mother     Social History  Substance Use Topics  . Smoking status: Never Smoker   . Smokeless tobacco: Never Used  . Alcohol Use: Yes     Comment: not while  pregnant     Allergies:  Allergies  Allergen Reactions  . Shellfish Allergy Anaphylaxis  . Adhesive [Tape] Swelling and Other (See Comments)    Reaction:  Bruising   . Latex Hives  . Betadine [Povidone Iodine] Rash    Prescriptions prior to admission  Medication Sig Dispense Refill Last Dose  . acetaminophen (TYLENOL) 325 MG tablet Take 325-650 mg by mouth every 6 (six) hours as needed for headache.   Past Month at Unknown time  . albuterol (PROVENTIL HFA;VENTOLIN HFA) 108 (90 BASE) MCG/ACT inhaler Inhale 1-2 puffs into the lungs every 6 (six) hours as needed for wheezing or shortness of breath.   Rescue  . diphenhydrAMINE (BENADRYL) 25 MG tablet Take 25 mg by mouth every 6 (six) hours as needed for allergies.   Past Month at Unknown time  . Doxylamine-Pyridoxine 10-10 MG TBEC Take 2 tablets by mouth at bedtime. Take 2 tablets at bedtime. If symptoms persist, add 1 tab in the AM starting on day 3. If symptoms persist, add 1 tab in the PM starting day 4. 90 tablet 1 09/04/2015 at Unknown time  . EPINEPHrine 0.3 mg/0.3 mL IJ SOAJ injection Inject 0.3 mg into the muscle once.  Emergency  . glycopyrrolate (ROBINUL) 1 MG tablet Take 1 tablet (1 mg total) by mouth 3 (three) times daily. 60 tablet 1   . metroNIDAZOLE (METROGEL VAGINAL) 0.75 % vaginal gel Place 1 Applicatorful vaginally 2 (two) times daily. 70 g 0 Past Week at Unknown time  . ondansetron (ZOFRAN-ODT) 8 MG disintegrating tablet Take 1 tablet (8 mg total) by mouth every 8 (eight) hours as needed for nausea or vomiting. 20 tablet 0 Past Month at Unknown time  . Prenatal Multivit-Min-Fe-FA (PRENATAL VITAMINS) 0.8 MG tablet Take 1 tablet by mouth daily. 30 tablet 12 Past Week at Unknown time  . promethazine (PHENERGAN) 25 MG suppository Place 1 suppository (25 mg total) rectally every 6 (six) hours as needed for nausea or vomiting. 12 suppository 1 09/04/2015 at Unknown time  . promethazine (PHENERGAN) 25 MG tablet Take 1 tablet (25 mg  total) by mouth every 6 (six) hours as needed for nausea or vomiting. 30 tablet 2 09/04/2015 at Unknown time    Review of Systems  Constitutional: Negative.   Respiratory: Negative.   Cardiovascular: Negative.   Gastrointestinal: Positive for nausea, vomiting and abdominal pain. Negative for heartburn, diarrhea and constipation.  Genitourinary: Negative.    Physical Exam   Blood pressure 109/67, pulse 90, temperature 98.1 F (36.7 C), temperature source Oral, resp. rate 17, height 5\' 6"  (1.676 m), weight 211 lb (95.709 kg), last menstrual period 03/06/2015, unknown if currently breastfeeding.  Physical Exam  Nursing note and vitals reviewed. Constitutional: She is oriented to person, place, and time. She appears well-developed and well-nourished. No distress.  HENT:  Head: Normocephalic and atraumatic.  Eyes: Conjunctivae are normal. Right eye exhibits no discharge. Left eye exhibits no discharge. No scleral icterus.  Neck: Normal range of motion.  Cardiovascular: Normal rate, regular rhythm and normal heart sounds.   No murmur heard. Respiratory: Effort normal and breath sounds normal. No respiratory distress. She has no wheezes.  GI: Soft. Bowel sounds are normal. There is no tenderness.  Neurological: She is alert and oriented to person, place, and time.  Skin: Skin is warm and dry. She is not diaphoretic.  Psychiatric: She has a normal mood and affect. Her behavior is normal. Judgment and thought content normal.   Dilation: Closed Effacement (%): Thick Station:  (high) Exam by:: e. Hayat Warbington,np  Fetal Tracing:  Baseline: 135 Variability: moderate Accelerations: 10x10 Decelerations: none  Toco: UI  MAU Course  Procedures Results for orders placed or performed during the hospital encounter of 09/26/15 (from the past 24 hour(s))  CBC     Status: Abnormal   Collection Time: 09/26/15 10:34 AM  Result Value Ref Range   WBC 6.4 4.0 - 10.5 K/uL   RBC 3.45 (L) 3.87 - 5.11  MIL/uL   Hemoglobin 9.8 (L) 12.0 - 15.0 g/dL   HCT 46.929.3 (L) 62.936.0 - 52.846.0 %   MCV 84.9 78.0 - 100.0 fL   MCH 28.4 26.0 - 34.0 pg   MCHC 33.4 30.0 - 36.0 g/dL   RDW 41.313.6 24.411.5 - 01.015.5 %   Platelets 305 150 - 400 K/uL  Comprehensive metabolic panel     Status: Abnormal   Collection Time: 09/26/15 10:34 AM  Result Value Ref Range   Sodium 132 (L) 135 - 145 mmol/L   Potassium 3.8 3.5 - 5.1 mmol/L   Chloride 102 101 - 111 mmol/L   CO2 21 (L) 22 - 32 mmol/L   Glucose, Bld 72 65 - 99 mg/dL   BUN <5 (  L) 6 - 20 mg/dL   Creatinine, Ser 1.61 (L) 0.44 - 1.00 mg/dL   Calcium 8.4 (L) 8.9 - 10.3 mg/dL   Total Protein 6.4 (L) 6.5 - 8.1 g/dL   Albumin 2.9 (L) 3.5 - 5.0 g/dL   AST 17 15 - 41 U/L   ALT 10 (L) 14 - 54 U/L   Alkaline Phosphatase 64 38 - 126 U/L   Total Bilirubin 0.5 0.3 - 1.2 mg/dL   GFR calc non Af Amer >60 >60 mL/min   GFR calc Af Amer >60 >60 mL/min   Anion gap 9 5 - 15  Amylase     Status: None   Collection Time: 09/26/15 10:34 AM  Result Value Ref Range   Amylase 63 28 - 100 U/L  Lipase, blood     Status: None   Collection Time: 09/26/15 10:34 AM  Result Value Ref Range   Lipase 22 11 - 51 U/L  Urinalysis, Routine w reflex microscopic (not at [facility])     Status: Abnormal   Collection Time: 09/26/15 11:45 AM  Result Value Ref Range   Color, Urine STRAW (A) YELLOW   APPearance CLEAR CLEAR   Specific Gravity, Urine <1.005 (L) 1.005 - 1.030   pH 6.0 5.0 - 8.0   Glucose, UA NEGATIVE NEGATIVE mg/dL   Hgb urine dipstick NEGATIVE NEGATIVE   Bilirubin Urine NEGATIVE NEGATIVE   Ketones, ur NEGATIVE NEGATIVE mg/dL   Protein, ur NEGATIVE NEGATIVE mg/dL   Nitrite NEGATIVE NEGATIVE   Leukocytes, UA NEGATIVE NEGATIVE    MDM CBC, CMP, amylase, lipase IV fluids - LR followed by MVI in D5LR IV phenergan & robinul Pt not observed vomiting while in MAU. Able to tolerate crackers & ginger ale. Reports improvement in symptoms.  S/w Dr. Mindi Slicker. Ok to discharge home.  Assessment and Plan   A: 1. Nausea and vomiting in pregnancy    P: Discharge home HG diet information given Continue antiemetics at home Keep prenatal appts as scheduled Discussed reasons to return to MAU  Judeth Horn, NP  09/26/2015, 9:44 AM

## 2015-09-26 NOTE — MAU Note (Signed)
Was here earlier today with occ ctxs. Started leaking fld about an hour ago. I had just urinated and got up and fld started coming out and I couldn't control it. Cont to leak small amts and wore a pad in.

## 2015-09-26 NOTE — Discharge Instructions (Signed)

## 2015-09-26 NOTE — MAU Note (Signed)
Pt c/o nausea and vomiting during the entire pregnancy. Pt states it has not worsened or changed it has been consistent. Pt c/o pain in mid abdomen around her belly button that has been on-going for two weeks but worsened last night. Pt denies bleeding or leaking of fluid.

## 2015-09-26 NOTE — Discharge Instructions (Signed)
Hyperemesis Gravidarum °Hyperemesis gravidarum is a severe form of nausea and vomiting that happens during pregnancy. Hyperemesis is worse than morning sickness. It may cause you to have nausea or vomiting all day for many days. It may keep you from eating and drinking enough food and liquids. Hyperemesis usually occurs during the first half (the first 20 weeks) of pregnancy. It often goes away once a woman is in her second half of pregnancy. However, sometimes hyperemesis continues through an entire pregnancy.  °CAUSES  °The cause of this condition is not completely known but is thought to be related to changes in the body's hormones when pregnant. It could be from the high level of the pregnancy hormone or an increase in estrogen in the body.  °SIGNS AND SYMPTOMS  °· Severe nausea and vomiting. °· Nausea that does not go away. °· Vomiting that does not allow you to keep any food down. °· Weight loss and body fluid loss (dehydration). °· Having no desire to eat or not liking food you have previously enjoyed. °DIAGNOSIS  °Your health care provider will do a physical exam and ask you about your symptoms. He or she may also order blood tests and urine tests to make sure something else is not causing the problem.  °TREATMENT  °You may only need medicine to control the problem. If medicines do not control the nausea and vomiting, you will be treated in the hospital to prevent dehydration, increased acid in the blood (acidosis), weight loss, and changes in the electrolytes in your body that may harm the unborn baby (fetus). You may need IV fluids.  °HOME CARE INSTRUCTIONS  °· Only take over-the-counter or prescription medicines as directed by your health care provider. °· Try eating a couple of dry crackers or toast in the morning before getting out of bed. °· Avoid foods and smells that upset your stomach. °· Avoid fatty and spicy foods. °· Eat 5-6 small meals a day. °· Do not drink when eating meals. Drink between  meals. °· For snacks, eat high-protein foods, such as cheese. °· Eat or suck on things that have ginger in them. Ginger helps nausea. °· Avoid food preparation. The smell of food can spoil your appetite. °· Avoid iron pills and iron in your multivitamins until after 3-4 months of being pregnant. However, consult with your health care provider before stopping any prescribed iron pills. °SEEK MEDICAL CARE IF:  °· Your abdominal pain increases. °· You have a severe headache. °· You have vision problems. °· You are losing weight. °SEEK IMMEDIATE MEDICAL CARE IF:  °· You are unable to keep fluids down. °· You vomit blood. °· You have constant nausea and vomiting. °· You have excessive weakness. °· You have extreme thirst. °· You have dizziness or fainting. °· You have a fever or persistent symptoms for more than 2-3 days. °· You have a fever and your symptoms suddenly get worse. °MAKE SURE YOU:  °· Understand these instructions. °· Will watch your condition. °· Will get help right away if you are not doing well or get worse. °  °This information is not intended to replace advice given to you by your health care provider. Make sure you discuss any questions you have with your health care provider. °  °Document Released: 09/20/2005 Document Revised: 07/11/2013 Document Reviewed: 05/02/2013 °Elsevier Interactive Patient Education ©2016 Elsevier Inc. ° °Eating Plan for Hyperemesis Gravidarum °Severe cases of hyperemesis gravidarum can lead to dehydration and malnutrition. The hyperemesis eating plan is   one way to lessen the symptoms of nausea and vomiting. It is often used with prescribed medicines to control your symptoms.  °WHAT CAN I DO TO RELIEVE MY SYMPTOMS? °Listen to your body. Everyone is different and has different preferences. Find what works best for you. Some of the following things may help: °· Eat and drink slowly. °· Eat 5-6 small meals daily instead of 3 large meals.   °· Eat crackers before you get out of  bed in the morning.   °· Starchy foods are usually well tolerated (such as cereal, toast, bread, potatoes, pasta, rice, and pretzels).   °· Ginger may help with nausea. Add ¼ tsp ground ginger to hot tea or choose ginger tea.   °· Try drinking 100% fruit juice or an electrolyte drink. °· Continue to take your prenatal vitamins as directed by your health care provider. If you are having trouble taking your prenatal vitamins, talk with your health care provider about different options. °· Include at least 1 serving of protein with your meals and snacks (such as meats or poultry, beans, nuts, eggs, or yogurt). Try eating a protein-rich snack before bed (such as cheese and crackers or a half turkey or peanut butter sandwich). °WHAT THINGS SHOULD I AVOID TO REDUCE MY SYMPTOMS? °The following things may help reduce your symptoms: °· Avoid foods with strong smells. Try eating meals in well-ventilated areas that are free of odors. °· Avoid drinking water or other beverages with meals. Try not to drink anything less than 30 minutes before and after meals. °· Avoid drinking more than 1 cup of fluid at a time. °· Avoid fried or high-fat foods, such as butter and cream sauces. °· Avoid spicy foods. °· Avoid skipping meals the best you can. Nausea can be more intense on an empty stomach. If you cannot tolerate food at that time, do not force it. Try sucking on ice chips or other frozen items and make up the calories later. °· Avoid lying down within 2 hours after eating. °  °This information is not intended to replace advice given to you by your health care provider. Make sure you discuss any questions you have with your health care provider. °  °Document Released: 07/18/2007 Document Revised: 09/25/2013 Document Reviewed: 07/25/2013 °Elsevier Interactive Patient Education ©2016 Elsevier Inc. ° ° °

## 2015-09-26 NOTE — MAU Provider Note (Signed)
Chief Complaint:  Rupture of Membranes   First Provider Initiated Contact with Patient 09/26/15 2238      HPI: Deanna Myers is a 24 y.o. G3P1011 at [redacted]w[redacted]d who presents to maternity admissions reporting gush of fluids while standing in her bathroom tonight. She reports she was seen in MAU earlier today for contractions and sent home.  She went to the bathroom and urinated, then wiped and stood up to wash her hands. She felt a gush of fluid that wet her underwear and pants.  She put on a pad after this and reports some light leakage but the pad is not very wet.   She reports good fetal movement, denies vaginal bleeding, vaginal itching/burning, urinary symptoms, h/a, dizziness, n/v, or fever/chills.    HPI  Past Medical History: Past Medical History  Diagnosis Date  . BV (bacterial vaginosis)   . Gonorrhea   . Chlamydia   . ADHD (attention deficit hyperactivity disorder)     since childhood  . Hyperemesis     G3  . Asthma     inhaler last used prior to pregnancy  . GERD (gastroesophageal reflux disease)     Past obstetric history: OB History  Gravida Para Term Preterm AB SAB TAB Ectopic Multiple Living  # Outcome Date GA Lbr Len/2nd Weight Sex Delivery Anes PTL Lv  3 Current           2 Term 01/16/14 [redacted]w[redacted]d 05:02 / 01:04 6 lb 5 oz (2.863 kg) F Vag-Spont EPI  Y  1 SAB 2011              Past Surgical History: Past Surgical History  Procedure Laterality Date  . No past surgeries      Family History: Family History  Problem Relation Age of Onset  . Cervical cancer Paternal Aunt   . Hypertension Father   . Congestive Heart Failure Maternal Grandmother   . Hypertension Mother     Social History: Social History  Substance Use Topics  . Smoking status: Never Smoker   . Smokeless tobacco: Never Used  . Alcohol Use: Yes     Comment: not while pregnant     Allergies:  Allergies  Allergen Reactions  . Shellfish Allergy Anaphylaxis  . Adhesive  [Tape] Swelling and Other (See Comments)    Reaction:  Bruising   . Latex Hives  . Betadine [Povidone Iodine] Rash    Meds:  Prescriptions prior to admission  Medication Sig Dispense Refill Last Dose  . acetaminophen (TYLENOL) 325 MG tablet Take 325-650 mg by mouth every 6 (six) hours as needed for headache.   Past Week at Unknown time  . albuterol (PROVENTIL HFA;VENTOLIN HFA) 108 (90 BASE) MCG/ACT inhaler Inhale 1-2 puffs into the lungs every 6 (six) hours as needed for wheezing or shortness of breath.   prn  . Doxylamine-Pyridoxine 10-10 MG TBEC Take 2 tablets by mouth at bedtime. Take 2 tablets at bedtime. If symptoms persist, add 1 tab in the AM starting on day 3. If symptoms persist, add 1 tab in the PM starting day 4. (Patient taking differently: Take 1-2 tablets by mouth 3 (three) times daily as needed (for nausea and vomiting). ) 90 tablet 1 Past Week at Unknown time  . EPINEPHrine 0.3 mg/0.3 mL IJ SOAJ injection Inject 0.3 mg into the muscle once.   prn  . glycopyrrolate (ROBINUL) 1 MG tablet Take 1 tablet (1  mg total) by mouth 3 (three) times daily. (Patient not taking: Reported on 09/26/2015) 60 tablet 1 Not Taking at Unknown time  . ondansetron (ZOFRAN-ODT) 8 MG disintegrating tablet Take 1 tablet (8 mg total) by mouth every 8 (eight) hours as needed for nausea or vomiting. 20 tablet 0 09/26/2015 at Unknown time  . Prenatal Multivit-Min-Fe-FA (PRENATAL VITAMINS) 0.8 MG tablet Take 1 tablet by mouth daily. (Patient not taking: Reported on 09/26/2015) 30 tablet 12 Not Taking at Unknown time  . promethazine (PHENERGAN) 25 MG suppository Place 1 suppository (25 mg total) rectally every 6 (six) hours as needed for nausea or vomiting. (Patient not taking: Reported on 09/26/2015) 12 suppository 1 Not Taking at Unknown time  . promethazine (PHENERGAN) 25 MG tablet Take 1 tablet (25 mg total) by mouth every 6 (six) hours as needed for nausea or vomiting. (Patient not taking: Reported on  09/26/2015) 30 tablet 2 Not Taking at Unknown time    ROS:  Review of Systems  Constitutional: Negative for fever, chills and fatigue.  Respiratory: Negative for shortness of breath.   Cardiovascular: Negative for chest pain.  Genitourinary: Negative for dysuria, flank pain, vaginal bleeding, vaginal discharge, difficulty urinating, vaginal pain and pelvic pain.  Neurological: Negative for dizziness and headaches.  Psychiatric/Behavioral: Negative.      I have reviewed patient's Past Medical Hx, Surgical Hx, Family Hx, Social Hx, medications and allergies.   Physical Exam   Patient Vitals for the past 24 hrs:  BP Temp Pulse Height Weight  09/26/15 2212 110/59 mmHg 98.4 F (36.9 C) 84 5\' 6"  (1.676 m) 218 lb (98.884 kg)   Constitutional: Well-developed, well-nourished female in no acute distress.  Cardiovascular: normal rate Respiratory: normal effort GI: Abd soft, non-tender, gravid appropriate for gestational age.  MS: Extremities nontender, no edema, normal ROM Neurologic: Alert and oriented x 4.  GU: Neg CVAT.  PELVIC EXAM: Cervix pink, visually closed, without lesion, scant white creamy discharge, no pooling of fluid, vaginal walls and external genitalia normal Cervix closed/thick/high, posterior   FHT:  Baseline 145, moderate variability, accelerations present, no decelerations Contractions: None on toco    Labs: Results for orders placed or performed during the hospital encounter of 09/26/15 (from the past 24 hour(s))  Am[facility] rupture of membrane (rom)[facility]     Status: None   Collection Time: 09/26/15 10:45 PM  Result Value Ref Range   Amnisure ROM NEGATIVE    --/--/O POS (09/08 2105)  Imaging:  No results found.  MAU Course/MDM: I have ordered labs and reviewed results.  Consult Dr Mindi SlickerBanga.  Negative physical exam and negative amnisure so unlikely rupture of membranes.  Reassurance provided to pt.  Preterm labor precautions given.  Pt stable at time of  discharge.  Assessment: 1. Stress incontinence during pregnancy     Plan: Discharge home Preterm labor precautions and fetal kick counts     Follow-up Information    [facility]   Specialty:  Obstetrics and Gynecology   Why:  As scheduled, Return to MAU as needed for emergencies   Contact information:   [address] [place] [address] [phone]        Medication List    TAKE these medications        acetaminophen 325 MG tablet  Commonly known as:  TYLENOL  Take 325-650 mg by mouth every 6 (six) hours as needed for headache.     albuterol 108 (90 BASE)  MCG/ACT inhaler  Commonly known as:  PROVENTIL HFA;VENTOLIN HFA  Inhale 1-2 puffs into the lungs every 6 (six) hours as needed for wheezing or shortness of breath.     Doxylamine-Pyridoxine 10-10 MG Tbec  Take 2 tablets by mouth at bedtime. Take 2 tablets at bedtime. If symptoms persist, add 1 tab in the AM starting on day 3. If symptoms persist, add 1 tab in the PM starting day 4.     EPINEPHrine 0.3 mg/0.3 mL Soaj injection  Commonly known as:  EPI-PEN  Inject 0.3 mg into the muscle once.     glycopyrrolate 1 MG tablet  Commonly known as:  ROBINUL  Take 1 tablet (1 mg total) by mouth 3 (three) times daily.     ondansetron 8 MG disintegrating tablet  Commonly known as:  ZOFRAN-ODT  Take 1 tablet (8 mg total) by mouth every 8 (eight) hours as needed for nausea or vomiting.     Prenatal Vitamins 0.8 MG tablet  Take 1 tablet by mouth daily.     promethazine 25 MG tablet  Commonly known as:  PHENERGAN  Take 1 tablet (25 mg total) by mouth every 6 (six) hours as needed for nausea or vomiting.     promethazine 25 MG suppository  Commonly known as:  PHENERGAN  Place 1 suppository (25 mg total) rectally every 6 (six) hours as needed for nausea or vomiting.        Sharen Counter Certified Nurse-Midwife 09/26/2015 11:10 PM

## 2015-09-30 ENCOUNTER — Other Ambulatory Visit (HOSPITAL_COMMUNITY): Payer: Self-pay | Admitting: Obstetrics and Gynecology

## 2015-09-30 DIAGNOSIS — Z3A27 27 weeks gestation of pregnancy: Secondary | ICD-10-CM

## 2015-09-30 DIAGNOSIS — Z3689 Encounter for other specified antenatal screening: Secondary | ICD-10-CM

## 2015-10-01 ENCOUNTER — Encounter (HOSPITAL_COMMUNITY): Payer: Self-pay

## 2015-10-01 ENCOUNTER — Ambulatory Visit (HOSPITAL_COMMUNITY)
Admission: RE | Admit: 2015-10-01 | Discharge: 2015-10-01 | Disposition: A | Discharge status: Home / Self Care | Payer: Medicaid Other | Source: Ambulatory Visit | Attending: Obstetrics and Gynecology | Admitting: Obstetrics and Gynecology

## 2015-10-01 ENCOUNTER — Other Ambulatory Visit (HOSPITAL_COMMUNITY): Payer: Self-pay | Admitting: Obstetrics and Gynecology

## 2015-10-01 DIAGNOSIS — O210 Mild hyperemesis gravidarum: Secondary | ICD-10-CM

## 2015-10-01 DIAGNOSIS — E669 Obesity, unspecified: Secondary | ICD-10-CM | POA: Insufficient documentation

## 2015-10-01 DIAGNOSIS — O26612 Liver and biliary tract disorders in pregnancy, second trimester: Secondary | ICD-10-CM

## 2015-10-01 DIAGNOSIS — Z3A27 27 weeks gestation of pregnancy: Secondary | ICD-10-CM

## 2015-10-01 DIAGNOSIS — Z3689 Encounter for other specified antenatal screening: Secondary | ICD-10-CM

## 2015-10-01 DIAGNOSIS — Z36 Encounter for antenatal screening of mother: Secondary | ICD-10-CM | POA: Diagnosis present

## 2015-10-01 DIAGNOSIS — R109 Unspecified abdominal pain: Secondary | ICD-10-CM

## 2015-10-01 DIAGNOSIS — O26899 Other specified pregnancy related conditions, unspecified trimester: Secondary | ICD-10-CM

## 2015-10-01 DIAGNOSIS — O99212 Obesity complicating pregnancy, second trimester: Secondary | ICD-10-CM | POA: Diagnosis not present

## 2015-10-03 ENCOUNTER — Encounter (HOSPITAL_COMMUNITY): Payer: Self-pay

## 2015-10-03 ENCOUNTER — Other Ambulatory Visit (HOSPITAL_COMMUNITY): Payer: Self-pay

## 2015-11-05 ENCOUNTER — Encounter (HOSPITAL_COMMUNITY): Payer: Self-pay | Admitting: *Deleted

## 2015-11-05 ENCOUNTER — Inpatient Hospital Stay (HOSPITAL_COMMUNITY)
Admission: AD | Admit: 2015-11-05 | Discharge: 2015-11-05 | Disposition: A | Discharge status: Home / Self Care | Payer: Medicaid Other | Source: Ambulatory Visit | Attending: Obstetrics and Gynecology | Admitting: Obstetrics and Gynecology

## 2015-11-05 DIAGNOSIS — Z3A32 32 weeks gestation of pregnancy: Secondary | ICD-10-CM | POA: Insufficient documentation

## 2015-11-05 DIAGNOSIS — O26893 Other specified pregnancy related conditions, third trimester: Secondary | ICD-10-CM | POA: Diagnosis not present

## 2015-11-05 DIAGNOSIS — M25559 Pain in unspecified hip: Secondary | ICD-10-CM | POA: Diagnosis present

## 2015-11-05 MED ORDER — HYDROCODONE-ACETAMINOPHEN 5-325 MG PO TABS
2.0000 | ORAL_TABLET | ORAL | Status: AC
  Administered 2015-11-05: 2 via ORAL
  Filled 2015-11-05: qty 2

## 2015-11-05 MED ORDER — HYDROCODONE-ACETAMINOPHEN 5-325 MG PO TABS: 1.0000 | ORAL_TABLET | ORAL | Status: DC

## 2015-11-05 NOTE — MAU Provider Note (Signed)
History     CSN: [id number]  Arrival date and time: 11/05/15 4098   First Provider Initiated Contact with Patient 11/05/15 1917      Chief Complaint  Patient presents with  . Hip Pain   HPI Deanna Myers 24 y.o. [id number]  presents complaining of severe hip pain.  She has had this off and on for several weeks.  She has been treated with Flexeril.  When she moves she has a stinging stabbing sensation in the front and side of hips.  It is worse with movement of legs in bed or walking.  Lying still helps.  It keeps flaring up since 3am when it was so bad when it awakened her.   She was told to come in to her doctors office but could not because it hurt so bad.  She used  of Tylenol at 2pm with no improvement in pain.   She denies vaginal bleeding, LOF, nausea, vomiting, constipation, diarrhea, eating issues.  She did have dysuria yesterday and the baby is moving well.   OB History    Gravida Para Term Preterm AB TAB SAB Ectopic Multiple Living   Past Medical History  Diagnosis Date  . BV (bacterial vaginosis)   . Gonorrhea   . Chlamydia   . ADHD (attention deficit hyperactivity disorder)     since childhood  . Hyperemesis     G3  . Asthma     inhaler last used prior to pregnancy  . GERD (gastroesophageal reflux disease)     Past Surgical History  Procedure Laterality Date  . No past surgeries      Family History  Problem Relation Age of Onset  . Cervical cancer Paternal Aunt   . Hypertension Father   . Congestive Heart Failure Maternal Grandmother   . Hypertension Mother     Social History  Substance Use Topics  . Smoking status: Never Smoker   . Smokeless tobacco: Never Used  . Alcohol Use: Yes     Comment: not while pregnant     Allergies:  Allergies  Allergen Reactions  . Shellfish Allergy Anaphylaxis  . Adhesive [Tape] Swelling and Other (See Comments)    Reaction:  Bruising   . Latex Hives  . Betadine [Povidone  Iodine] Rash    Prescriptions prior to admission  Medication Sig Dispense Refill Last Dose  . acetaminophen (TYLENOL) 325 MG tablet Take 325-650 mg by mouth every 6 (six) hours as needed for headache.   11/05/2015 at Unknown time  . cyclobenzaprine (FLEXERIL) 10 MG tablet Take 10 mg by mouth 3 (three) times daily as needed for muscle spasms.   11/05/2015 at Unknown time  . albuterol (PROVENTIL HFA;VENTOLIN HFA) 108 (90 BASE) MCG/ACT inhaler Inhale 1-2 puffs into the lungs every 6 (six) hours as needed for wheezing or shortness of breath.   rescue  . Doxylamine-Pyridoxine 10-10 MG TBEC Take 2 tablets by mouth at bedtime. Take 2 tablets at bedtime. If symptoms persist, add 1 tab in the AM starting on day 3. If symptoms persist, add 1 tab in the PM starting day 4. (Patient not taking: Reported on 11/05/2015) 90 tablet 1 Past Week at Unknown time  . EPINEPHrine 0.3 mg/0.3 mL IJ SOAJ injection Inject 0.3 mg into the muscle once.   rescue  . glycopyrrolate (ROBINUL) 1 MG tablet Take 1 tablet (1 mg total) by mouth 3 (three) times daily. (Patient  not taking: Reported on 09/26/2015) 60 tablet 1 Not Taking at Unknown time  . ondansetron (ZOFRAN-ODT) 8 MG disintegrating tablet Take 1 tablet (8 mg total) by mouth every 8 (eight) hours as needed for nausea or vomiting. (Patient not taking: Reported on 11/05/2015) 20 tablet 0 Not Taking at Unknown time  . Prenatal Multivit-Min-Fe-FA (PRENATAL VITAMINS) 0.8 MG tablet Take 1 tablet by mouth daily. (Patient not taking: Reported on 09/26/2015) 30 tablet 12 Not Taking at Unknown time  . promethazine (PHENERGAN) 25 MG suppository Place 1 suppository (25 mg total) rectally every 6 (six) hours as needed for nausea or vomiting. (Patient not taking: Reported on 09/26/2015) 12 suppository 1 Not Taking at Unknown time  . promethazine (PHENERGAN) 25 MG tablet Take 1 tablet (25 mg total) by mouth every 6 (six) hours as needed for nausea or vomiting. (Patient not taking: Reported on  09/26/2015) 30 tablet 2 Not Taking at Unknown time    ROS Pertinent ROS in HPI.  All other systems are negative.   Physical Exam   Blood pressure 128/66, pulse 90, temperature 98.2 F (36.8 C), resp. rate 18, last menstrual period 03/24/2015, unknown if currently breastfeeding.  Physical Exam  Constitutional: She is oriented to person, place, and time. She appears well-developed and well-nourished. No distress.  HENT:  Head: Normocephalic and atraumatic.  Eyes: Conjunctivae and EOM are normal.  Neck: Normal range of motion. Neck supple.  Cardiovascular: Normal rate and normal heart sounds.   Respiratory: Effort normal and breath sounds normal.  GI: Soft. She exhibits no distension. There is no tenderness.  Musculoskeletal:  Tender to palpation bilat inguinal creases and lateral hip  Neurological: She is alert and oriented to person, place, and time.  Skin: Skin is warm and dry.  Psychiatric: She has a normal mood and affect. Her behavior is normal.    MAU Course  Procedures  MDM Patient declines to use ice Discussed with Dr. Senaida Ores.  She advises for symptomatic treatment with Norco - okay to send home with rx for #10.  Also advised maternity support belt.  If continued pain - will likely pursue PT.    Assessment and Plan  A: Musculoskeletal hip pain in pregnancy  P: Discharge to home Norco rx Maternity Support belt strongly advised F/u in Clinic if pain continues Keep ob appts Patient may return to MAU as needed or if her condition were to change or worsen   Bertram Denver 11/05/2015, 7:18 PM

## 2015-11-05 NOTE — MAU Note (Signed)
Pt presents to MAU with complaints of pain in her hips. Denies any vaginal bleeding or LOF

## 2015-11-05 NOTE — Discharge Instructions (Signed)
Joint Pain Joint pain can be caused by many things. The joint can be bruised, infected, weak from aging, or sore from exercise. The pain will probably go away if you follow your doctor's instructions for home care. If your joint pain continues, more tests may be needed to help find the cause of your condition. HOME CARE Watch your condition for any changes. Follow these instructions as told to lessen the pain that you are feeling:  Take medicines only as told by your doctor.  Rest the sore joint for as long as told by your doctor. If your doctor tells you to, raise (elevate) the painful joint above the level of your heart while you are sitting or lying down.  Do not do things that cause pain or make the pain worse.  If told, put ice on the painful area:  Put ice in a plastic bag.  Place a towel between your skin and the bag.  Leave the ice on for 20 minutes, 2-3 times per day.  Wear an elastic bandage, splint, or sling as told by your doctor. Loosen the bandage or splint if your fingers or toes lose feeling (become numb) and tingle, or if they turn cold and blue.  Begin exercising or stretching the joint as told by your doctor. Ask your doctor what types of exercise are safe for you.  Keep all follow-up visits as told by your doctor. This is important. GET HELP IF:  Your pain gets worse and medicine does not help it.  Your joint pain does not get better in 3 days.  You have more bruising or swelling.  You have a fever.  You lose 10 pounds (4.5 kg) or more without trying. GET HELP RIGHT AWAY IF:  You are not able to move the joint.  Your fingers or toes become numb or they turn cold and blue.   This information is not intended to replace advice given to you by your health care provider. Make sure you discuss any questions you have with your health care provider.   Document Released: 09/08/2009 Document Revised: 10/11/2014 Document Reviewed: 07/02/2014 Elsevier Interactive  Patient Education 2016 Elsevier Inc.  Hip Pain Your hip is the joint between your upper legs and your lower pelvis. The bones, cartilage, tendons, and muscles of your hip joint perform a lot of work each day supporting your body weight and allowing you to move around. Hip pain can range from a minor ache to severe pain in one or both of your hips. Pain may be felt on the inside of the hip joint near the groin, or the outside near the buttocks and upper thigh. You may have swelling or stiffness as well.  HOME CARE INSTRUCTIONS   Take medicines only as directed by your health care provider.  Apply ice to the injured area:  Put ice in a plastic bag.  Place a towel between your skin and the bag.  Leave the ice on for 15-20 minutes at a time, 3-4 times a day.  Keep your leg raised (elevated) when possible to lessen swelling.  Avoid activities that cause pain.  Follow specific exercises as directed by your health care provider.  Sleep with a pillow between your legs on your most comfortable side.  Record how often you have hip pain, the location of the pain, and what it feels like. SEEK MEDICAL CARE IF:   You are unable to put weight on your leg.  Your hip is red or swollen or  very tender to touch.  Your pain or swelling continues or worsens after 1 week.  You have increasing difficulty walking.  You have a fever. SEEK IMMEDIATE MEDICAL CARE IF:   You have fallen.  You have a sudden increase in pain and swelling in your hip. MAKE SURE YOU:   Understand these instructions.  Will watch your condition.  Will get help right away if you are not doing well or get worse.   This information is not intended to replace advice given to you by your health care provider. Make sure you discuss any questions you have with your health care provider.   Document Released: 03/10/2010 Document Revised: 10/11/2014 Document Reviewed: 05/17/2013 Elsevier Interactive Patient Education [employer].

## 2015-11-05 NOTE — MAU Note (Signed)
Patient declines urine sample. PA made aware.

## 2015-11-27 LAB — OB RESULTS CONSOLE GBS: GBS: POSITIVE

## 2015-12-21 ENCOUNTER — Encounter (HOSPITAL_COMMUNITY): Payer: Self-pay

## 2015-12-21 ENCOUNTER — Inpatient Hospital Stay (HOSPITAL_COMMUNITY): Payer: Medicaid Other | Admitting: Anesthesiology

## 2015-12-21 ENCOUNTER — Inpatient Hospital Stay (HOSPITAL_COMMUNITY)
Admission: AD | Admit: 2015-12-21 | Discharge: 2015-12-24 | DRG: 766 | Disposition: A | Discharge status: Home / Self Care | Payer: Medicaid Other | Source: Ambulatory Visit | Attending: Obstetrics and Gynecology | Admitting: Obstetrics and Gynecology

## 2015-12-21 ENCOUNTER — Encounter (HOSPITAL_COMMUNITY)
Admission: AD | Disposition: A | Discharge status: Home / Self Care | Payer: Self-pay | Source: Ambulatory Visit | Attending: Obstetrics and Gynecology

## 2015-12-21 DIAGNOSIS — Z3A38 38 weeks gestation of pregnancy: Secondary | ICD-10-CM

## 2015-12-21 DIAGNOSIS — K219 Gastro-esophageal reflux disease without esophagitis: Secondary | ICD-10-CM | POA: Diagnosis present

## 2015-12-21 DIAGNOSIS — J45909 Unspecified asthma, uncomplicated: Secondary | ICD-10-CM | POA: Diagnosis not present

## 2015-12-21 DIAGNOSIS — O99824 Streptococcus B carrier state complicating childbirth: Secondary | ICD-10-CM | POA: Diagnosis present

## 2015-12-21 DIAGNOSIS — O321XX0 Maternal care for breech presentation, not applicable or unspecified: Principal | ICD-10-CM | POA: Diagnosis present

## 2015-12-21 DIAGNOSIS — IMO0001 Reserved for inherently not codable concepts without codable children: Secondary | ICD-10-CM

## 2015-12-21 DIAGNOSIS — Z8049 Family history of malignant neoplasm of other genital organs: Secondary | ICD-10-CM | POA: Diagnosis not present

## 2015-12-21 DIAGNOSIS — Z8249 Family history of ischemic heart disease and other diseases of the circulatory system: Secondary | ICD-10-CM | POA: Diagnosis not present

## 2015-12-21 HISTORY — DX: Reserved for inherently not codable concepts without codable children: IMO0001

## 2015-12-21 LAB — CBC
HEMATOCRIT: 29.1 % — AB (ref 36.0–46.0)
Hemoglobin: 9.5 g/dL — ABNORMAL LOW (ref 12.0–15.0)
MCH: 25.1 pg — ABNORMAL LOW (ref 26.0–34.0)
MCHC: 32.6 g/dL (ref 30.0–36.0)
MCV: 76.8 fL — AB (ref 78.0–100.0)
Platelets: 351 10*3/uL (ref 150–400)
RBC: 3.79 MIL/uL — ABNORMAL LOW (ref 3.87–5.11)
RDW: 14.1 % (ref 11.5–15.5)
WBC: 6.6 10*3/uL (ref 4.0–10.5)

## 2015-12-21 LAB — TYPE AND SCREEN
ABO/RH(D): O POS
Antibody Screen: NEGATIVE

## 2015-12-21 SURGERY — Surgical Case
Anesthesia: Spinal

## 2015-12-21 MED ORDER — FENTANYL CITRATE (PF) 100 MCG/2ML IJ SOLN
INTRAMUSCULAR | Status: AC
  Filled 2015-12-21: qty 2

## 2015-12-21 MED ORDER — MORPHINE SULFATE (PF) 0.5 MG/ML IJ SOLN
INTRAMUSCULAR | Status: AC
  Filled 2015-12-21: qty 10

## 2015-12-21 MED ORDER — SCOPOLAMINE 1 MG/3DAYS TD PT72
1.0000 | MEDICATED_PATCH | Freq: Once | TRANSDERMAL | Status: DC
  Filled 2015-12-21: qty 1

## 2015-12-21 MED ORDER — TERBUTALINE SULFATE 1 MG/ML IJ SOLN
0.2500 mg | Freq: Once | INTRAMUSCULAR | Status: AC
  Administered 2015-12-21: 0.25 mg via SUBCUTANEOUS
  Filled 2015-12-21: qty 1

## 2015-12-21 MED ORDER — NALBUPHINE HCL 10 MG/ML IJ SOLN
5.0000 mg | INTRAMUSCULAR | Status: DC | PRN
  Administered 2015-12-22: 5 mg via INTRAVENOUS
  Filled 2015-12-21: qty 1

## 2015-12-21 MED ORDER — CEFAZOLIN SODIUM-DEXTROSE 2-3 GM-% IV SOLR
2.0000 g | INTRAVENOUS | Status: AC
  Administered 2015-12-21: 2 g via INTRAVENOUS
  Filled 2015-12-21: qty 50

## 2015-12-21 MED ORDER — PHENYLEPHRINE 8 MG IN D5W 100 ML (0.08MG/ML) PREMIX OPTIME
INJECTION | INTRAVENOUS | Status: AC
  Filled 2015-12-21: qty 100

## 2015-12-21 MED ORDER — OXYTOCIN 10 UNIT/ML IJ SOLN
40.0000 [IU] | INTRAMUSCULAR | Status: DC | PRN
  Administered 2015-12-21: 40 [IU] via INTRAVENOUS

## 2015-12-21 MED ORDER — MORPHINE SULFATE (PF) 0.5 MG/ML IJ SOLN
INTRAMUSCULAR | Status: DC | PRN
  Administered 2015-12-21: .2 mg via INTRATHECAL

## 2015-12-21 MED ORDER — OXYTOCIN 10 UNIT/ML IJ SOLN
INTRAMUSCULAR | Status: AC
  Filled 2015-12-21: qty 4

## 2015-12-21 MED ORDER — ACETAMINOPHEN 325 MG PO TABS
650.0000 mg | ORAL_TABLET | ORAL | Status: DC | PRN
  Administered 2015-12-22 (×2): 650 mg via ORAL
  Filled 2015-12-21 (×2): qty 2

## 2015-12-21 MED ORDER — SENNOSIDES-DOCUSATE SODIUM 8.6-50 MG PO TABS
2.0000 | ORAL_TABLET | ORAL | Status: DC
  Administered 2015-12-22 – 2015-12-23 (×2): 2 via ORAL
  Filled 2015-12-21 (×2): qty 2

## 2015-12-21 MED ORDER — WITCH HAZEL-GLYCERIN EX PADS: 1.0000 "application " | MEDICATED_PAD | CUTANEOUS | Status: DC | PRN

## 2015-12-21 MED ORDER — FENTANYL CITRATE (PF) 100 MCG/2ML IJ SOLN
INTRAMUSCULAR | Status: DC | PRN
  Administered 2015-12-21: 10 ug via INTRATHECAL

## 2015-12-21 MED ORDER — ALBUTEROL SULFATE (2.5 MG/3ML) 0.083% IN NEBU: 3.0000 mL | INHALATION_SOLUTION | Freq: Four times a day (QID) | RESPIRATORY_TRACT | Status: DC | PRN

## 2015-12-21 MED ORDER — NALBUPHINE HCL 10 MG/ML IJ SOLN: 5.0000 mg | Freq: Once | INTRAMUSCULAR | Status: DC | PRN

## 2015-12-21 MED ORDER — OXYCODONE HCL 5 MG PO TABS
10.0000 mg | ORAL_TABLET | ORAL | Status: DC | PRN
  Administered 2015-12-23 – 2015-12-24 (×3): 10 mg via ORAL
  Filled 2015-12-21 (×3): qty 2

## 2015-12-21 MED ORDER — DIPHENHYDRAMINE HCL 50 MG/ML IJ SOLN
INTRAMUSCULAR | Status: DC | PRN
  Administered 2015-12-21: 25 mg via INTRAVENOUS

## 2015-12-21 MED ORDER — SIMETHICONE 80 MG PO CHEW
80.0000 mg | CHEWABLE_TABLET | ORAL | Status: DC | PRN
  Administered 2015-12-23: 80 mg via ORAL

## 2015-12-21 MED ORDER — NALBUPHINE HCL 10 MG/ML IJ SOLN: 5.0000 mg | INTRAMUSCULAR | Status: DC | PRN

## 2015-12-21 MED ORDER — KETOROLAC TROMETHAMINE 30 MG/ML IJ SOLN
30.0000 mg | Freq: Four times a day (QID) | INTRAMUSCULAR | Status: AC | PRN
  Administered 2015-12-21: 30 mg via INTRAMUSCULAR

## 2015-12-21 MED ORDER — DIBUCAINE 1 % RE OINT: 1.0000 "application " | TOPICAL_OINTMENT | RECTAL | Status: DC | PRN

## 2015-12-21 MED ORDER — KETOROLAC TROMETHAMINE 30 MG/ML IJ SOLN: 30.0000 mg | Freq: Four times a day (QID) | INTRAMUSCULAR | Status: AC | PRN

## 2015-12-21 MED ORDER — MENTHOL 3 MG MT LOZG: 1.0000 | LOZENGE | OROMUCOSAL | Status: DC | PRN

## 2015-12-21 MED ORDER — CITRIC ACID-SODIUM CITRATE 334-500 MG/5ML PO SOLN
30.0000 mL | Freq: Once | ORAL | Status: AC
  Administered 2015-12-21: 30 mL via ORAL
  Filled 2015-12-21: qty 15

## 2015-12-21 MED ORDER — KETOROLAC TROMETHAMINE 30 MG/ML IJ SOLN
INTRAMUSCULAR | Status: AC
  Filled 2015-12-21: qty 1

## 2015-12-21 MED ORDER — PRENATAL MULTIVITAMIN CH
1.0000 | ORAL_TABLET | Freq: Every day | ORAL | Status: DC
Start: 2015-12-22 — End: 2015-12-24
  Administered 2015-12-22 – 2015-12-23 (×2): 1 via ORAL
  Filled 2015-12-21 (×2): qty 1

## 2015-12-21 MED ORDER — SCOPOLAMINE 1 MG/3DAYS TD PT72
MEDICATED_PATCH | TRANSDERMAL | Status: DC | PRN
  Administered 2015-12-21: 1 via TRANSDERMAL

## 2015-12-21 MED ORDER — SIMETHICONE 80 MG PO CHEW
80.0000 mg | CHEWABLE_TABLET | Freq: Three times a day (TID) | ORAL | Status: DC
  Administered 2015-12-22 – 2015-12-23 (×4): 80 mg via ORAL
  Filled 2015-12-21 (×5): qty 1

## 2015-12-21 MED ORDER — DIPHENHYDRAMINE HCL 50 MG/ML IJ SOLN: 12.5000 mg | INTRAMUSCULAR | Status: DC | PRN

## 2015-12-21 MED ORDER — METOCLOPRAMIDE HCL 5 MG/ML IJ SOLN
INTRAMUSCULAR | Status: DC | PRN
  Administered 2015-12-21: 10 mg via INTRAVENOUS

## 2015-12-21 MED ORDER — PHENYLEPHRINE HCL 10 MG/ML IJ SOLN
10000.0000 ug | INTRAMUSCULAR | Status: DC | PRN
  Administered 2015-12-21 (×3): 80 ug via INTRAVENOUS

## 2015-12-21 MED ORDER — LACTATED RINGERS IV BOLUS (SEPSIS)
1000.0000 mL | Freq: Once | INTRAVENOUS | Status: AC
  Administered 2015-12-21: 1000 mL via INTRAVENOUS

## 2015-12-21 MED ORDER — SIMETHICONE 80 MG PO CHEW
80.0000 mg | CHEWABLE_TABLET | ORAL | Status: DC
  Administered 2015-12-22 – 2015-12-23 (×2): 80 mg via ORAL
  Filled 2015-12-21 (×2): qty 1

## 2015-12-21 MED ORDER — BUPIVACAINE IN DEXTROSE 0.75-8.25 % IT SOLN
INTRATHECAL | Status: DC | PRN
  Administered 2015-12-21: 12 mg via INTRATHECAL

## 2015-12-21 MED ORDER — DEXAMETHASONE SODIUM PHOSPHATE 4 MG/ML IJ SOLN
INTRAMUSCULAR | Status: DC | PRN
  Administered 2015-12-21: 4 mg via INTRAVENOUS

## 2015-12-21 MED ORDER — PHENYLEPHRINE 8 MG IN D5W 100 ML (0.08MG/ML) PREMIX OPTIME
INJECTION | INTRAVENOUS | Status: DC | PRN
  Administered 2015-12-21: 80 ug/min via INTRAVENOUS

## 2015-12-21 MED ORDER — LACTATED RINGERS IV SOLN
INTRAVENOUS | Status: DC
Start: 2015-12-21 — End: 2015-12-21
  Administered 2015-12-21 (×2): via INTRAVENOUS

## 2015-12-21 MED ORDER — ZOLPIDEM TARTRATE 5 MG PO TABS: 5.0000 mg | ORAL_TABLET | Freq: Every evening | ORAL | Status: DC | PRN

## 2015-12-21 MED ORDER — IBUPROFEN 600 MG PO TABS
600.0000 mg | ORAL_TABLET | Freq: Four times a day (QID) | ORAL | Status: DC
  Administered 2015-12-22 – 2015-12-24 (×10): 600 mg via ORAL
  Filled 2015-12-21 (×10): qty 1

## 2015-12-21 MED ORDER — LACTATED RINGERS IV SOLN
INTRAVENOUS | Status: DC
  Administered 2015-12-22: 05:00:00 via INTRAVENOUS

## 2015-12-21 MED ORDER — NALOXONE HCL 0.4 MG/ML IJ SOLN: 0.4000 mg | INTRAMUSCULAR | Status: DC | PRN

## 2015-12-21 MED ORDER — DEXTROSE 5 % IV SOLN
1.0000 ug/kg/h | INTRAVENOUS | Status: DC | PRN
  Filled 2015-12-21: qty 2

## 2015-12-21 MED ORDER — OXYCODONE HCL 5 MG PO TABS
5.0000 mg | ORAL_TABLET | ORAL | Status: DC | PRN
  Administered 2015-12-22 – 2015-12-24 (×4): 5 mg via ORAL
  Filled 2015-12-21 (×4): qty 1

## 2015-12-21 MED ORDER — MEPERIDINE HCL 25 MG/ML IJ SOLN: 6.2500 mg | INTRAMUSCULAR | Status: DC | PRN

## 2015-12-21 MED ORDER — DIPHENHYDRAMINE HCL 25 MG PO CAPS
25.0000 mg | ORAL_CAPSULE | Freq: Four times a day (QID) | ORAL | Status: DC | PRN
  Administered 2015-12-22: 25 mg via ORAL
  Filled 2015-12-21: qty 1

## 2015-12-21 MED ORDER — DIPHENHYDRAMINE HCL 25 MG PO CAPS
25.0000 mg | ORAL_CAPSULE | ORAL | Status: DC | PRN
  Administered 2015-12-22: 25 mg via ORAL
  Filled 2015-12-21: qty 1

## 2015-12-21 MED ORDER — ONDANSETRON HCL 4 MG/2ML IJ SOLN
INTRAMUSCULAR | Status: DC | PRN
  Administered 2015-12-21: 4 mg via INTRAVENOUS

## 2015-12-21 MED ORDER — OXYTOCIN 10 UNIT/ML IJ SOLN: 2.5000 [IU]/h | INTRAMUSCULAR | Status: AC

## 2015-12-21 MED ORDER — TETANUS-DIPHTH-ACELL PERTUSSIS 5-2.5-18.5 LF-MCG/0.5 IM SUSP: 0.5000 mL | Freq: Once | INTRAMUSCULAR | Status: DC

## 2015-12-21 MED ORDER — SODIUM CHLORIDE 0.9% FLUSH: 3.0000 mL | INTRAVENOUS | Status: DC | PRN

## 2015-12-21 MED ORDER — LANOLIN HYDROUS EX OINT: 1.0000 "application " | TOPICAL_OINTMENT | CUTANEOUS | Status: DC | PRN

## 2015-12-21 MED ORDER — FAMOTIDINE IN NACL 20-0.9 MG/50ML-% IV SOLN
20.0000 mg | Freq: Once | INTRAVENOUS | Status: AC
  Administered 2015-12-21: 20 mg via INTRAVENOUS
  Filled 2015-12-21: qty 50

## 2015-12-21 MED ORDER — ONDANSETRON HCL 4 MG/2ML IJ SOLN: 4.0000 mg | Freq: Three times a day (TID) | INTRAMUSCULAR | Status: DC | PRN

## 2015-12-21 MED ORDER — ONDANSETRON HCL 4 MG/2ML IJ SOLN
INTRAMUSCULAR | Status: AC
  Filled 2015-12-21: qty 2

## 2015-12-21 SURGICAL SUPPLY — 32 items
BENZOIN TINCTURE PRP APPL 2/3 (GAUZE/BANDAGES/DRESSINGS) IMPLANT
CLAMP CORD UMBIL (MISCELLANEOUS) IMPLANT
CLOTH BEACON ORANGE TIMEOUT ST (SAFETY) ×2 IMPLANT
DRSG OPSITE POSTOP 4X10 (GAUZE/BANDAGES/DRESSINGS) ×2 IMPLANT
DURAPREP 26ML APPLICATOR (WOUND CARE) ×2 IMPLANT
ELECT REM PT RETURN 9FT ADLT (ELECTROSURGICAL) ×2
ELECTRODE REM PT RTRN 9FT ADLT (ELECTROSURGICAL) ×1 IMPLANT
EXTRACTOR VACUUM KIWI (MISCELLANEOUS) IMPLANT
GLOVE BIO SURGEON STRL SZ 6.5 (GLOVE) ×2 IMPLANT
GLOVE BIOGEL PI IND STRL 7.0 (GLOVE) ×1 IMPLANT
GLOVE BIOGEL PI INDICATOR 7.0 (GLOVE) ×1
GOWN STRL REUS W/TWL LRG LVL3 (GOWN DISPOSABLE) ×4 IMPLANT
KIT ABG SYR 3ML LUER SLIP (SYRINGE) IMPLANT
NEEDLE HYPO 25X5/8 SAFETYGLIDE (NEEDLE) IMPLANT
NS IRRIG 1000ML POUR BTL (IV SOLUTION) ×2 IMPLANT
PACK C SECTION WH (CUSTOM PROCEDURE TRAY) ×2 IMPLANT
PAD OB MATERNITY 4.3X12.25 (PERSONAL CARE ITEMS) ×2 IMPLANT
PENCIL SMOKE EVAC W/HOLSTER (ELECTROSURGICAL) ×2 IMPLANT
RTRCTR C-SECT PINK 25CM LRG (MISCELLANEOUS) ×2 IMPLANT
STRIP CLOSURE SKIN 1/2X4 (GAUZE/BANDAGES/DRESSINGS) IMPLANT
SUT CHROMIC 1 CTX 36 (SUTURE) ×4 IMPLANT
SUT PLAIN 0 NONE (SUTURE) IMPLANT
SUT PLAIN 2 0 XLH (SUTURE) ×2 IMPLANT
SUT VIC AB 0 CT1 27 (SUTURE) ×2
SUT VIC AB 0 CT1 27XBRD ANBCTR (SUTURE) ×2 IMPLANT
SUT VIC AB 2-0 CT1 27 (SUTURE) ×1
SUT VIC AB 2-0 CT1 TAPERPNT 27 (SUTURE) ×1 IMPLANT
SUT VIC AB 3-0 CT1 27 (SUTURE)
SUT VIC AB 3-0 CT1 TAPERPNT 27 (SUTURE) IMPLANT
SUT VIC AB 4-0 KS 27 (SUTURE) ×2 IMPLANT
TOWEL OR 17X24 6PK STRL BLUE (TOWEL DISPOSABLE) ×2 IMPLANT
TRAY FOLEY CATH SILVER 14FR (SET/KITS/TRAYS/PACK) ×2 IMPLANT

## 2015-12-21 NOTE — Transfer of Care (Signed)
Immediate Anesthesia Transfer of Care Note  Patient: Deanna Myers  Procedure(s) Performed: Procedure(s): CESAREAN SECTION (N/A)  Patient Location: PACU  Anesthesia Type:Spinal  Level of Consciousness: awake, alert  and oriented  Airway & Oxygen Therapy: Patient Spontanous Breathing  Post-op Assessment: Report given to RN and Post -op Vital signs reviewed and stable  Post vital signs: Reviewed and stable  Last Vitals:  Filed Vitals:   12/21/15 1630 12/21/15 1821  BP: 126/75 132/75  Pulse: 101 120  Temp:  36.7 C  Resp:  18    Complications: No apparent anesthesia complications

## 2015-12-21 NOTE — Anesthesia Preprocedure Evaluation (Signed)
Anesthesia Evaluation  Patient identified by MRN, date of birth, ID band Patient awake    Reviewed: Allergy & Precautions, NPO status , Patient's Chart, lab work & pertinent test results  Airway Mallampati: I  TM Distance: >3 FB     Dental   Pulmonary asthma ,    breath sounds clear to auscultation       Cardiovascular negative cardio ROS   Rhythm:Regular Rate:Normal     Neuro/Psych negative neurological ROS     GI/Hepatic Neg liver ROS, GERD  ,  Endo/Other  negative endocrine ROS  Renal/GU negative Renal ROS     Musculoskeletal   Abdominal   Peds  Hematology negative hematology ROS (+)   Anesthesia Other Findings   Reproductive/Obstetrics (+) Pregnancy                             Lab Results  Component Value Date   WBC 6.6 12/21/2015   HGB 9.5* 12/21/2015   HCT 29.1* 12/21/2015   MCV 76.8* 12/21/2015   PLT 351 12/21/2015   Lab Results  Component Value Date   CREATININE 0.31* 09/26/2015   BUN <5* 09/26/2015   NA 132* 09/26/2015   K 3.8 09/26/2015   CL 102 09/26/2015   CO2 21* 09/26/2015    Anesthesia Physical Anesthesia Plan  ASA: II  Anesthesia Plan: Spinal   Post-op Pain Management:    Induction:   Airway Management Planned: Natural Airway  Additional Equipment:   Intra-op Plan:   Post-operative Plan:   Informed Consent: I have reviewed the patients History and Physical, chart, labs and discussed the procedure including the risks, benefits and alternatives for the proposed anesthesia with the patient or authorized representative who has indicated his/her understanding and acceptance.     Plan Discussed with: CRNA and Surgeon  Anesthesia Plan Comments:         Anesthesia Quick Evaluation

## 2015-12-21 NOTE — Op Note (Signed)
Operative Note    Preoperative Diagnosis Term pregnancy at 38 6/7 weeks Labor Breech presentation  Postoperative Diagnosis same  Procedure Primary low transverse c-section with two layer closure of uterus  Surgeon Huel CoteKathy Ollie Delano, MD Janalyn ShyHeather Kreitemeyer, Assist  Anesthesia Spinal  Fluids: EBL  700cc UOP 150 clear IVF 2200cc LR  Findings Viable female infant in the frank breech position.  Apgars 8,9.  Weight pending.  Placenta normal.  Uterus, tubes and ovaries normal.  Specimen Placenta to L&D  Procedure Note    Procedure note  Patient was taken to the operating room where spinal anesthesia was obtained and  found to be adequate by Allis clamp test. She was prepped and draped in the normal sterile fashion in the dorsal supine position with a leftward tilt. An appropriate time out was performed. A Pfannenstiel skin incision was then made with the scalpel and carried through to the underlying layer of fascia by sharp dissection and Bovie cautery. The fascia was nicked in the midline and the incision was extended laterally with Mayo scissors. The inferior aspect of the incision was grasped Coker clamps and dissected off the underlying rectus muscles. In a similar fashion the superior aspect was dissected off the rectus muscles. Rectus muscles were separated in the midline and the peritoneal cavity entered bluntly. The peritoneal incision was then extended both superiorly and inferiorly with careful attention to avoid both bowel and bladder. The Alexis self-retaining wound retractor was then placed within the incision and the lower uterine segment exposed. The bladder flap was developed with Metzenbaum scissors and pushed away from the lower uterine segment. The lower uterine segment was then incised in a transverse fashion and the cavity itself entered bluntly. The incision was extended bluntly. The infant's feet were then grasped and gentle traction placed to deliver the infant  to the level of the scapula.  The arms were then reduced over the chest and finally the head delivered in a flexed position.  The infant was delivered and the nose and mouth bulb suctioned with the cord clamped and cut as well. The infant was handed off to the waiting pediatricians. The placenta was then spontaneously expressed from the uterus and the uterus cleared of all clots and debris with moist lap sponge. The uterine incision was then repaired in 2 layers the first layer was a running locked layer 1-0 chromic and the second an imbricating layer of the same suture. The tubes and ovaries were inspected and the gutters cleared of all clots and debris. The uterine incision was inspected and found to be hemostatic. All instruments and sponges as well as the Alexis retractor were then removed from the abdomen. The rectus muscles and peritoneum were then reapproximated with a running suture of 2-0 Vicryl. The fascia was then closed with 0 Vicryl in a running fashion. Subcutaneous tissue was reapproximated with 3-0 plain in a running fashion. The skin was closed with a subcuticular stitch of 4-0 Vicryl on a Keith needle and then reinforced with benzoin and Steri-Strips. At the conclusion of the procedure all instruments and sponge counts were correct. Patient was taken to the recovery room in good condition with her baby accompanying her.

## 2015-12-21 NOTE — Anesthesia Postprocedure Evaluation (Signed)
Anesthesia Post Note  Patient: Deanna Myers  Procedure(s) Performed: Procedure(s) (LRB): CESAREAN SECTION (N/A)  Patient location during evaluation: PACU Anesthesia Type: Spinal Level of consciousness: oriented and awake and alert Pain management: pain level controlled Vital Signs Assessment: post-procedure vital signs reviewed and stable Respiratory status: spontaneous breathing, respiratory function stable and patient connected to nasal cannula oxygen Cardiovascular status: blood pressure returned to baseline and stable Postop Assessment: no headache, no backache and spinal receding Anesthetic complications: no    Last Vitals:  Filed Vitals:   12/21/15 2227 12/21/15 2231  BP:    Pulse: 64   Temp:  36.6 C  Resp: 16     Last Pain:  Filed Vitals:   12/21/15 2231  PainSc: 0-No pain                 Kennieth RadFitzgerald, Josha Weekley E

## 2015-12-21 NOTE — H&P (Signed)
[name] is a 25 y.o. female G4P1011 at 7338 6/7 weeks (EDD 12/29/15 by LMP c/Myers 9 week US) presenting for regular, painful contractions and cervical change from 2 to 3 cm, despite IV hydration and terbutaline.   On presentation, discovered to be breech.  Prenatal care relatively uncomplicated, some question of cholestasis, but bile acids normalized.  She is GBS positive.  Maternal Medical History:  Reason for admission: Contractions.   Contractions: Onset was 3-5 hours ago.   Frequency: regular.   Perceived severity is moderate.    Fetal activity: Perceived fetal activity is normal.    Prenatal Complications - Diabetes: none.    OB History    Gravida Para Term Preterm AB TAB SAB Ectopic Multiple Living   3 1 1  1  1   1     SAB x 1  NSVD x 1  Past Medical History  Diagnosis Date  . BV (bacterial vaginosis)   . Gonorrhea   . Chlamydia   . ADHD (attention deficit hyperactivity disorder)     since childhood  . Hyperemesis     G3  . Asthma     inhaler last used prior to pregnancy  . GERD (gastroesophageal reflux disease)    Past Surgical History  Procedure Laterality Date  . No past surgeries     Family History: family history includes Cervical cancer in her paternal aunt; Congestive Heart Failure in her maternal grandmother; Hypertension in her father and mother. Social History:  reports that she has never smoked. She has never used smokeless tobacco. She reports that she drinks alcohol. She reports that she does not use illicit drugs.   Prenatal Transfer Tool  Maternal Diabetes: No Genetic Screening: Normal Maternal Ultrasounds/Referrals: Normal Fetal Ultrasounds or other Referrals:  None Maternal Substance Abuse:  No Significant Maternal Medications:  None Significant Maternal Lab Results:  Lab values include: Group B Strep positive Other Comments:  None  Review of Systems  Gastrointestinal: Positive for abdominal pain.    Dilation: 3 Effacement (%):  80 Station: -2 Exam by:: [email or web address],rnc Blood pressure 126/75, pulse 101, temperature 98.5 F (36.9 C), temperature source Oral, resp. rate 18, height 5\' 6"  (1.676 m), weight 100.245 kg (221 lb), last menstrual period 03/24/2015, unknown if currently breastfeeding. Maternal Exam:  Uterine Assessment: Contraction strength is moderate.  Contraction frequency is regular.   Abdomen: Patient reports no abdominal tenderness. Fetal presentation: breech  Introitus: Normal vulva. Normal vagina.    Physical Exam  Constitutional: She appears well-developed.  Cardiovascular: Normal rate.   Respiratory: Effort normal.  GI: Soft.  Genitourinary: Vagina normal.  Neurological: She is alert.  Psychiatric: She has a normal mood and affect.    Prenatal labs: ABO, Rh: --/--/O POS (09/08 2105) Antibody: NEG (09/08 2105) Rubella:  Immune RPR: Nonreactive (09/06 0000)  HBsAg:   Neg HIV: Non-reactive (09/06 0000)  GBS: Positive (02/23 0000)  First trimester screen and AFP negative One hour GCT 84 CF neg HgbAA  Assessment/Plan: Pt presents in labor with cervical change despite attemps at [place] with breech presentation.  D/Myers her need to proceed with c-section.  Risks and benfefits reviewed including bleeding, infection and possible damage to bowel and bladder.  She is ok with a blood transfusion if needed.  Pt agreeable to proceed when OR available.    Deanna Myers 12/21/2015, 6:07 PM

## 2015-12-21 NOTE — Transfer of Care (Deleted)
Immediate Anesthesia Transfer of Care Note  Patient: Deanna Myers  Procedure(s) Performed: Procedure(s): CESAREAN SECTION (N/A)  Patient Location: PACU  Anesthesia Type:Epidural  Level of Consciousness: awake, alert  and oriented  Airway & Oxygen Therapy: Patient Spontanous Breathing  Post-op Assessment: Report given to RN and Post -op Vital signs reviewed and stable  Post vital signs: Reviewed and stable  Last Vitals:  Filed Vitals:   12/21/15 1630 12/21/15 1821  BP: 126/75 132/75  Pulse: 101 120  Temp:  36.7 C  Resp:  18    Complications: No apparent anesthesia complications

## 2015-12-21 NOTE — MAU Note (Signed)
Pt c/o contractions starting several days ago and getting stronger today around 230pm. Pt denies bleeding and leaking of fluid. Pt states baby is moving normally.

## 2015-12-22 ENCOUNTER — Encounter (HOSPITAL_COMMUNITY): Payer: Self-pay | Admitting: Obstetrics and Gynecology

## 2015-12-22 LAB — CBC
HCT: 27.9 % — ABNORMAL LOW (ref 36.0–46.0)
Hemoglobin: 9.1 g/dL — ABNORMAL LOW (ref 12.0–15.0)
MCH: 25.2 pg — ABNORMAL LOW (ref 26.0–34.0)
MCHC: 32.6 g/dL (ref 30.0–36.0)
MCV: 77.3 fL — AB (ref 78.0–100.0)
PLATELETS: 313 10*3/uL (ref 150–400)
RBC: 3.61 MIL/uL — AB (ref 3.87–5.11)
RDW: 14.1 % (ref 11.5–15.5)
WBC: 9.4 10*3/uL (ref 4.0–10.5)

## 2015-12-22 LAB — RPR: RPR: NONREACTIVE

## 2015-12-22 NOTE — Progress Notes (Signed)
Subjective: Postpartum Day 1 Cesarean Delivery Patient reports tolerating PO.  Some itching, but pain controlled.  Foley still in  Objective: Vital signs in last 24 hours: Temp:  [97.7 F (36.5 C)-99.3 F (37.4 C)] 98.8 F (37.1 C) (03/20 0445) Pulse Rate:  [62-120] 62 (03/20 0445) Resp:  [12-32] 18 (03/20 0445) BP: (107-132)/(55-75) 113/61 mmHg (03/20 0445) SpO2:  [94 %-100 %] 97 % (03/20 0445) Weight:  [100.245 kg (221 lb)] 100.245 kg (221 lb) (03/19 1629)  Physical Exam:  General: alert and cooperative Lochia: appropriate Uterine Fundus: firm Incision: C/D/I    Recent Labs  12/21/15 1715 12/22/15 0510  HGB 9.5* 9.1*  HCT 29.1* 27.9*    Assessment/Plan: Status post Cesarean section. Doing well postoperatively.  Continue current care.  Oliver PilaRICHARDSON,Exavior Kimmons W 12/22/2015, 8:38 AM

## 2015-12-22 NOTE — Lactation Note (Signed)
This note was copied from a baby's chart. Lactation Consultation Note Experienced BF mom has 38 6/7 weeker that weights 5.15lbs. Encouraged mom to supplement w/colostrum. Mom demonstrated hand expression w/easy flow of colostrum. Encouraged to DEBP that RN is setting up and cleaning taught, and to pump every three hours and hand express afterwards. Mom doesn't want to supplement w/formula, only her colostrum which was encouraged to use. Mom encouraged to feed baby 8-12 times/24 hours and with feeding cues. Reviewed Baby & Me book's Breastfeeding Basics. Mom encouraged to do skin-to-skin.Mom encouraged to waken baby for feeds. Educated about newborn behavior, I&O, supply and demand. WH/LC brochure given w/resources, support groups and LC services. Patient Name: Deanna Myers [id number]'s Date: 12/22/2015 Reason for consult: Initial assessment   Maternal Data Has patient been taught Hand Expression?: Yes Does the patient have breastfeeding experience prior to this delivery?: Yes  Feeding Feeding Type: Breast Fed Length of feed: 5 min  LATCH Score/Interventions          Comfort (Breast/Nipple): Soft / non-tender     Intervention(s): Breastfeeding basics reviewed;Support Pillows;Position options;Skin to skin     Lactation Tools Discussed/Used Tools: Pump Breast pump type: Double-Electric Breast Pump WIC Program: Yes Pump Review: Setup, frequency, and cleaning;Milk Storage Initiated by:: RN/April Date initiated:: 12/22/15   Consult Status Consult Status: Follow-up Date: 12/23/15 Follow-up type: In-patient    Felicity Penix, Diamond NickelLAURA G 12/22/2015, 6:50 AM

## 2015-12-22 NOTE — Progress Notes (Signed)
UR chart review completed.  

## 2015-12-23 ENCOUNTER — Encounter (HOSPITAL_COMMUNITY): Payer: Self-pay | Admitting: *Deleted

## 2015-12-23 NOTE — Lactation Note (Signed)
This note was copied from a baby's chart. Lactation Consultation Note Follow up visit at 45 hours of age.  Baby has had 12 recorded feedings with 1 void (2320 last pm) and 3 stools.  Baby is 5#12 oz.  Mom reports baby is sleepy for feedings.  LC encouraged mom to pump and offer as appetizer or dessert with each feeding.  Mom to work on hand expression after pumping for 15 minutes.  Mom to offer spoon feeding of 5mls if she is able to collect enough.  Mom does not want to use formula and denies problems feeding an older child.  Mom to call for [facility] LLCATCH score during the night.  Mom to call for assist as needed.    Patient Name: Deanna Myers [id number]'WToday's Date: 12/23/2015 Reason for consult: Follow-up assessment;Infant < 6lbs   Maternal Data    Feeding Feeding Type: Breast Fed Length of feed: 10 min  LATCH Score/Interventions                Intervention(s): Breastfeeding basics reviewed     Lactation Tools Discussed/Used     Consult Status Consult Status: Follow-up Date: 12/24/15 Follow-up type: In-patient    Shoptaw, [name] 12/23/2015, 6:21 PM

## 2015-12-23 NOTE — Progress Notes (Signed)
Subjective: Postpartum Day 2 Cesarean Delivery Patient reports incisional pain and tolerating PO.  Pt has only voided twice since foley out.  Objective: Vital signs in last 24 hours: Temp:  [98 F (36.7 C)-98.9 F (37.2 C)] 98 F (36.7 C) (03/21 0550) Pulse Rate:  [70-92] 75 (03/21 0550) Resp:  [18-20] 20 (03/21 0550) BP: (102-123)/(54-71) 114/61 mmHg (03/21 0550) SpO2:  [98 %-99 %] 98 % (03/20 1953)  Physical Exam:  General: cooperative and fatigued Lochia: appropriate Uterine Fundus: firm Incision: C/D/I   Recent Labs  12/21/15 1715 12/22/15 0510  HGB 9.5* 9.1*  HCT 29.1* 27.9*    Assessment/Plan: Status post Cesarean section. Doing well postoperatively.  Continue current care. Suspect mild urinary retention.  Informed pt and nursing to attempt voiding every 3-4 hours.  If no void check bladder scan and I&O appropriately Blake Goya W 12/23/2015, 8:40 AM

## 2015-12-23 NOTE — Anesthesia Postprocedure Evaluation (Signed)
Anesthesia Post Note  Patient: Deanna Myers  Procedure(s) Performed: Procedure(s) (LRB): CESAREAN SECTION (N/A)  Patient location during evaluation: Mother Baby Anesthesia Type: Epidural and Spinal Level of consciousness: awake and alert Pain management: pain level controlled Vital Signs Assessment: post-procedure vital signs reviewed and stable Respiratory status: spontaneous breathing, nonlabored ventilation and respiratory function stable Cardiovascular status: stable Postop Assessment: no headache, no backache, epidural receding, no signs of nausea or vomiting and patient able to bend at knees Anesthetic complications: no    Last Vitals:  Filed Vitals:   12/23/15 0550 12/23/15 1906  BP: 114/61 112/69  Pulse: 75 99  Temp: 36.7 C 36.9 C  Resp: 20 20    Last Pain:  Filed Vitals:   12/23/15 1906  PainSc: 8                  Elchanan Bob

## 2015-12-23 NOTE — Addendum Note (Signed)
Addendum  created 12/23/15 1933 by Rica RecordsAngela Darby Fleeman, CRNA   Modules edited: Clinical Notes   Clinical Notes:  File: [id number]

## 2015-12-24 MED ORDER — IBUPROFEN 600 MG PO TABS: 600.0000 mg | ORAL_TABLET | Freq: Four times a day (QID) | ORAL | Status: DC

## 2015-12-24 MED ORDER — OXYCODONE HCL 10 MG PO TABS: 10.0000 mg | ORAL_TABLET | ORAL | Status: DC | PRN

## 2015-12-24 NOTE — Clinical Social Work Maternal (Signed)
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   Referral Source:  Central Nursery   Address:  [address] [place], [place] [address]  Phone number:  [phone]   Household Members:  Minor Children, Significant Other   Natural Supports (not living in the home):  Immediate Family, Extended Family   Professional Supports: None   Employment: Student   Type of Work: Attends [employer], studying to become a CNA   Education:  Attending college   Financial Resources:  Medicaid   Other Resources:  Food Stamps , WIC   Cultural/Religious Considerations Which May Impact Care:  None reported  Strengths:  Ability to meet basic needs , Home prepared for child , Pediatrician chosen    Risk Factors/Current Problems:   1. Substance Use: MOB reported infrequent THC use during the pregnancy to assist with nausea. MOB reported last THC use 2-3 weeks ago. Unable to obtain infant's UDS, but umbilical cord drug panel is pending.    Cognitive State:  Able to Concentrate , Alert , Goal Oriented , Linear Thinking    Mood/Affect:  Happy , Animated, Bright    CSW Assessment:  CSW received request for consult due to MOB presenting with a history of THC use during the pregnancy. Per chart review, MOB endorsed THC use during her first prenatal record. No toxicology screens are on file for MOB.    MOB presented as easily engaged and receptive to the visit. She was noted to be in a pleasant mood, displayed a full range in affect, and expressed eagerness,  excitement, and happiness secondary to upcoming discharge home.  MOB identified her C-section has scary, and shared that she cried when she first was informed of the need to have a C-section due to the infant being breech. She reported that she was scared and nervous since she never has had surgery, and did not know what to anticipate and expect. Per MOB, she was alone when she received the news, and felt that this made it worse since she did not have immediate access to her support system.  MOB shared that once her support system arrived at the hospital, she realized that she was going to be okay, and she was able to better cope with the news. MOB stated that she is happy and excited that she has a healthy infant, and reported that a live infant is the most important thing for her.    MOB continued to express gratitude for the FOB and the other members of her support system. She stated that her support system is helpful, and discussed how they help ensure that the home is clean, her other child is taken care of, and that she will have extra help in order to sleep and take care of the infant. MOB articulate the importance of taking care of herself as she transitions postpartum. Per MOB, she feels content with her current level of support. MOB reported that the FOB is employed, and all basic needs are met.   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 She shared that she has created short term and long term goals for her career, and reported that she intends to pursue a 4 year degree after her CNA has been completed. MOB smiled and expressed a full range in affect when she began discussing how she feels when she accomplishes her goals. MOB stated that she is proud of her accomplishments, and is looking forward to continuing her education.   MOB identified numerous positive aspects of her life. She stated that she recognized that there will be stressors moving forward, and was receptive to exploring  how to remember what is going well despite the tendency to ruminate on negativity.   MOB denied presence of a mental health history. She denied history of perinatal mood disorders after her first child was born, and denied mental health complications with this pregnancy.  MOB shared that she is familiar with the Baby Blues and perinatal mood disorders, and reported that she has seen family members and friends experience symptoms in the past.  Without prompting, she discussed need to closely monitor and to follow up with her medical provider if she notes symptoms since she has seen in others what can happen if symptoms are untreated. MOB was receptive to the education that was provided by CSW, as evidenced by her maintaining eye contact and asking follow up questions.  MOB confirmed history of THC use during the pregnancy. MOB does not identify her THC as a problem, and stated that it was "now and then".  MOB shared that she does not understand why it is still considered an illegal substance, and shared that "at least it's not anything else".  MOB expressed intense nausea, and an inability to eat. She discussed need to eat, and felt that THC was the only intervention that would work. Per MOB, she attempted to use the other medications as prescribed, but all were ineffective.  Per MOB, last THC use was approximately 2-3 weeks ago.   MOB was informed of the hospital drug screen policy. She voiced frustration, but verbalized understanding. MOB aware that the umbilical cord is pending, and shared that she anticipates it to be positive for THC.   CSW informed MOB of need to report to CPS if it is positive. She denied questions or concerns. Per MOB, she has never had CPS involvement in the past, but denied presence of additional safety concerns in her home.  She expressed appreciation for knowing what to anticipate and expect, and discussed how she is willing to allow CPS in her home to demonstrate that substance use  does not negatively impact the safety of her children.   MOB expressed appreciation for the visit, and denied questions, concerns, or needs.   CSW Plan/Description:   1. Patient/Family Education-- perinatal mood and anxiety disorders, hospital drug screen policy  2. CSW to monitor infant's toxicology screens, and will report to CPS if positive. 3. No Further Intervention Required/No Barriers to Discharge    Kamarie Veno N, LCSW 12/24/2015, 11:12 AM  

## 2015-12-24 NOTE — Discharge Summary (Signed)
OB Discharge Summary     Patient Name: Deanna Myers DOB: 06-24-1991 MRN: [id number]  Date of admission: 12/21/2015 Delivering MD: [name]   Date of discharge: 12/24/2015  Admitting diagnosis: 39 WKS, CTXS, Breech Intrauterine pregnancy: [redacted]w[redacted]d     Secondary diagnosis:  Active Problems:   Active labor at term Breech presentation  Additional problems:none     Discharge diagnosis: Term Pregnancy Delivered                                                                                                Post partum procedures:none    Complications: None  Hospital course:  Onset of Labor With Unplanned C/S  25 y.o. yo [id number] at [redacted]w[redacted]d was admitted in Active Labor on 12/21/2015. Patient had a labor course significant for breech presentation. Membrane Rupture Time/Date: 8:22 PM ,12/21/2015   The patient went for cesarean section due to Malpresentation, and delivered a Viable infant,12/21/2015  Details of operation can be found in separate operative note. Patient had an uncomplicated postpartum course.  She is ambulating,tolerating a regular diet, passing flatus, and urinating well.  Patient is discharged home in stable condition 12/24/2015.  Physical exam  Filed Vitals:   12/22/15 1953 12/23/15 0550 12/23/15 1906 12/24/15 0558  BP: 123/71 114/61 112/69 117/67  Pulse: 92 75 99 88  Temp: 98.1 F (36.7 C) 98 F (36.7 C) 98.5 F (36.9 C) 98.1 F (36.7 C)  TempSrc: Oral Oral Oral   Resp: Height:      Weight:      SpO2: 98%      General: alert and cooperative Lochia: appropriate Uterine Fundus: firm Incision: Dressing is clean, dry, and intact DVT Evaluation: No evidence of DVT seen on physical exam. Labs: Lab Results  Component Value Date   WBC 9.4 12/22/2015   HGB 9.1* 12/22/2015   HCT 27.9* 12/22/2015   MCV 77.3* 12/22/2015   PLT 313 12/22/2015   CMP Latest Ref Rng 09/26/2015  Glucose 65 - 99 mg/dL 72  BUN 6 - 20 mg/dL <1(Y)  Creatinine 7.82  - 1.00 mg/dL 9.56(O)  Sodium 130 - 865 mmol/L 132(L)  Potassium 3.5 - 5.1 mmol/L 3.8  Chloride 101 - 111 mmol/L 102  CO2 22 - 32 mmol/L 21(L)  Calcium 8.9 - 10.3 mg/dL 7.8(I)  Total Protein 6.5 - 8.1 g/dL 6.4(L)  Total Bilirubin 0.3 - 1.2 mg/dL 0.5  Alkaline Phos 38 - 126 U/L 64  AST 15 - 41 U/L 17  ALT 14 - 54 U/L 10(L)    Discharge instruction: per After Visit Summary and "Baby and Me Booklet".  After visit meds:    Medication List    STOP taking these medications        acetaminophen 325 MG tablet  Commonly known as:  TYLENOL     cyclobenzaprine 10 MG tablet  Commonly known as:  FLEXERIL     HYDROcodone-acetaminophen 5-325 MG tablet  Commonly known as:  NORCO/VICODIN      TAKE these medications        albuterol 108 (90 Base)  MCG/ACT inhaler  Commonly known as:  PROVENTIL HFA;VENTOLIN HFA  Inhale 1-2 puffs into the lungs every 6 (six) hours as needed for wheezing or shortness of breath.     EPINEPHrine 0.3 mg/0.3 mL Soaj injection  Commonly known as:  EPI-PEN  Inject 0.3 mg into the muscle once.     ibuprofen 600 MG tablet  Commonly known as:  ADVIL,MOTRIN  Take 1 tablet (600 mg total) by mouth every 6 (six) hours.     Oxycodone HCl 10 MG Tabs  Take 1 tablet (10 mg total) by mouth every 4 (four) hours as needed (pain scale > 7).     Prenatal Vitamins 0.8 MG tablet  Take 1 tablet by mouth daily.        Diet: routine diet  Activity: Advance as tolerated. Pelvic rest for 6 weeks.   Outpatient follow up:2 weeks Follow up Appt:No future appointments. Follow up Visit:No Follow-up on file.  Postpartum contraception: Undecided  Newborn Data: Live born female  Birth Weight: 5 lb 15.8 oz (2715 g) APGAR: 8, 9  Baby Feeding: Breast Disposition:home with mother   12/24/2015 Deanna Myers,Deanna Borman W, MD

## 2015-12-24 NOTE — Lactation Note (Signed)
This note was copied from a baby's chart. Lactation Consultation Note  Baby latched in side lying upon entering.  Sucks and some swallows observed. Mother's breasts are filling and mother feels uncomfortable. She is pumping more than 20- 50 ml per pumping session.  Provided mother with ice packs and suggest massaging breasts and apply ice packs for 10-15 min for comfort. If she pumps, suggest she post pump for 5 min just to soften until her milk supply adjusts. Continue breastfeeding on demand.  Give baby back extra breastmilk two times a day. If weight starts to drop, mother needs to increase the amount of supplemental breastmilk per day. Mother wants to give volume back with syringe.  Also provided her with foley cup and taught her how to use.  She does not want to give bottle to baby yet. Helped her label pumped breastmilk.  Reviewed labels, storage and warming. Provided mother w/ hand pump.  Provided mother with #30 flanges. Encouraged her to monitor voids/stools and call us if we can assist further. [facility]     Monitor voids/stools.  Patient Name: Deanna Myers ZOXWR'UToday's Date: 12/24/2015 Reason for consult: Follow-up assessment   Maternal Data    Feeding Feeding Type: Breast Fed Length of feed: 30 min  LATCH Score/Interventions Latch: Grasps breast easily, tongue down, lips flanged, rhythmical sucking.  Audible Swallowing: A few with stimulation Intervention(s): Skin to skin;Hand expression;Alternate breast massage  Type of Nipple: Everted at rest and after stimulation  Comfort (Breast/Nipple): Soft / non-tender     Hold (Positioning): No assistance needed to correctly position infant at breast.  LATCH Score: 9  Lactation Tools Discussed/Used     Consult Status Consult Status: Complete    Hardie PulleyBerkelhammer, Guerry Covington Boschen 12/24/2015, 10:31 AM

## 2015-12-24 NOTE — Lactation Note (Addendum)
This note was copied from a baby's chart. Lactation Consultation Note  Baby latched in side lying upon entering.  Sucks and some swallows observed. Mother's breasts are filling and mother feels uncomfortable. She is pumping more than 20- 50 ml per pumping session.  Provided mother with ice packs and suggest massaging breasts and apply ice packs for 10-15 min for comfort. If she pumps, suggest she post pump for 5 min just to soften until her milk supply adjusts. Continue breastfeeding on demand.  Give baby back extra breastmilk two times a day. If weight starts to drop, mother needs to increase the amount of supplemental breastmilk per day. Mother wants to give volume back with syringe.  Also provided her with foley cup and taught her how to use.  She does not want to give bottle to baby yet. Helped her label pumped breastmilk.  Reviewed labels, storage and warming. Provided mother w/ hand pump.  Provided mother with #30 flanges. Encouraged her to monitor voids/stools and call us if we can assist further. [facility]    Monitor voids/stools.  Patient Name: Deanna Myers [id number]'ZToday's Date: 12/24/2015 Reason for consult: Follow-up assessment   Maternal Data    Feeding Feeding Type: Breast Fed Length of feed: 30 min  LATCH Score/Interventions Latch: Grasps breast easily, tongue down, lips flanged, rhythmical sucking.  Audible Swallowing: A few with stimulation Intervention(s): Skin to skin;Hand expression;Alternate breast massage  Type of Nipple: Everted at rest and after stimulation  Comfort (Breast/Nipple): Soft / non-tender     Hold (Positioning): No assistance needed to correctly position infant at breast.  LATCH Score: 9  Lactation Tools Discussed/Used     Consult Status Consult Status: Complete    Hardie PulleyBerkelhammer, Ruth Boschen 12/24/2015, 9:43 AM

## 2015-12-24 NOTE — Progress Notes (Signed)
Subjective: Postpartum Day 3 Cesarean Delivery Patient reports tolerating PO and no problems voiding.    Objective: Vital signs in last 24 hours: Temp:  [98.1 F (36.7 C)-98.5 F (36.9 C)] 98.1 F (36.7 C) (03/22 0558) Pulse Rate:  [88-99] 88 (03/22 0558) Resp:  [18-20] 18 (03/22 0558) BP: (112-117)/(67-69) 117/67 mmHg (03/22 0558)  Physical Exam:  General: alert and cooperative Lochia: appropriate Uterine Fundus: firm Incision:C/D/I    Recent Labs  12/21/15 1715 12/22/15 0510  HGB 9.5* 9.1*  HCT 29.1* 27.9*    Assessment/Plan: Status post Cesarean section. Doing well postoperatively.  Discharge home with standard precautions and return to clinic in 2 weeks.  Oliver PilaRICHARDSON,Donnita Farina W 12/24/2015, 10:31 AM

## 2015-12-24 NOTE — Progress Notes (Signed)
Patient states she is emptying her bladder more frequently and feels like she is emptying her bladder better.. She denies a lot of pain.

## 2016-03-15 IMAGING — US US OB TRANSVAGINAL
1 series · 14 of 28 positions shown · non-contrast
Comparison: February 18, 2015 and April 25, 2015

CLINICAL DATA: Apparent recent spontaneous abortion; lack of
menstrual periods since apparent spontaneous abortion.

EXAM:
TRANSVAGINAL OB ULTRASOUND
TECHNIQUE: Transvaginal ultrasound was performed for complete evaluation of the
gestation as well as the maternal uterus, adnexal regions, and
pelvic cul-de-sac.

[Series 1: us ob transvaginal · 28 acquisitions, 14 frames shown]
[im 2/28]
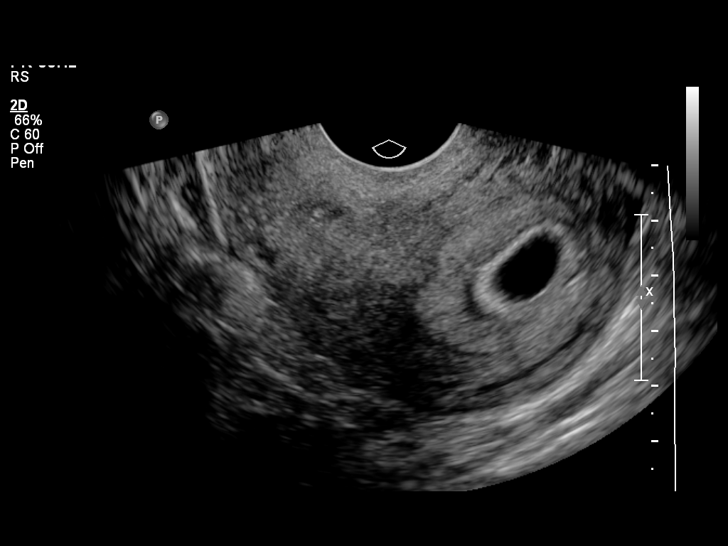
[im 4/28]
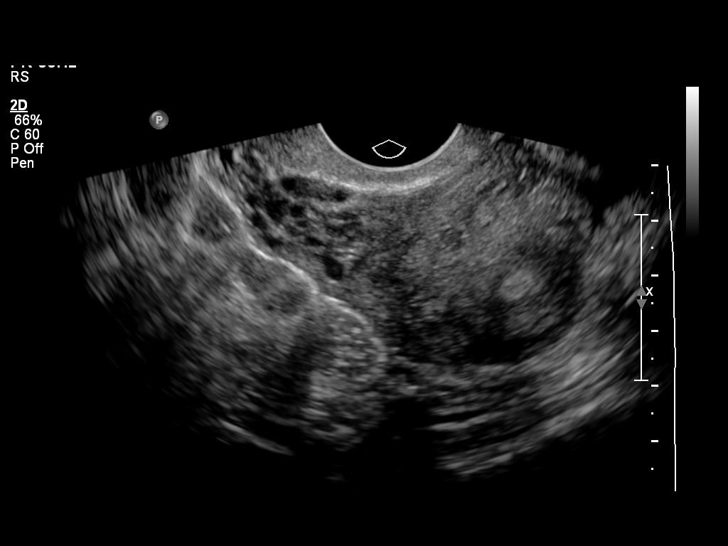
[im 6/28]
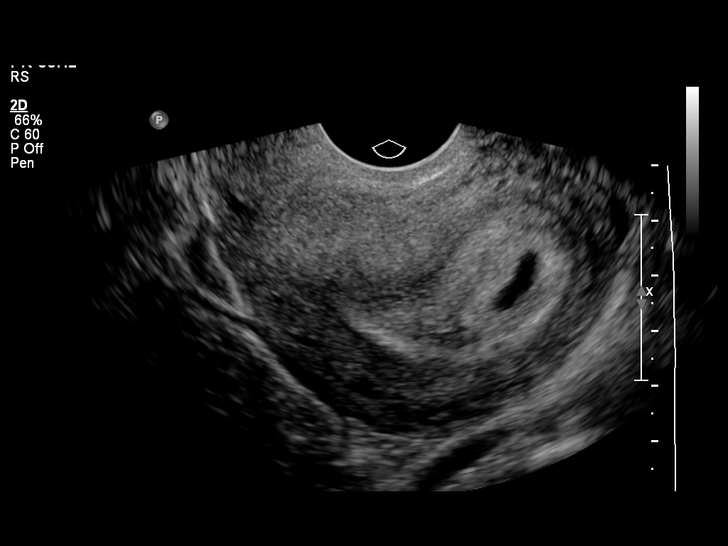
[im 8/28]
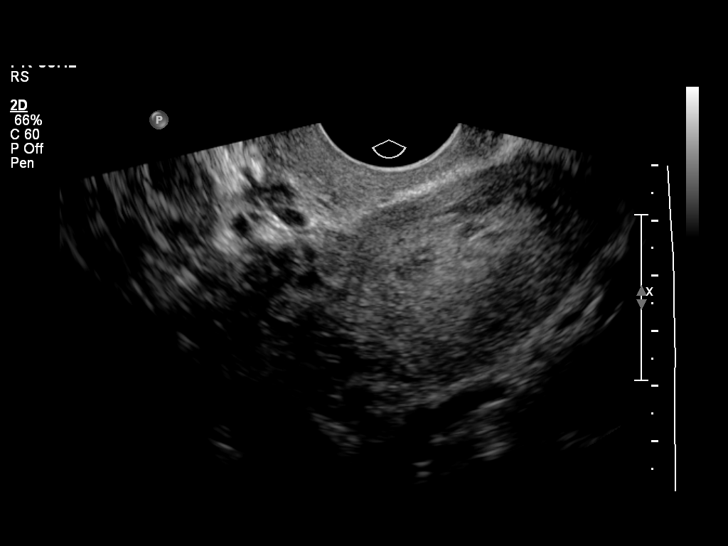
[im 10/28]
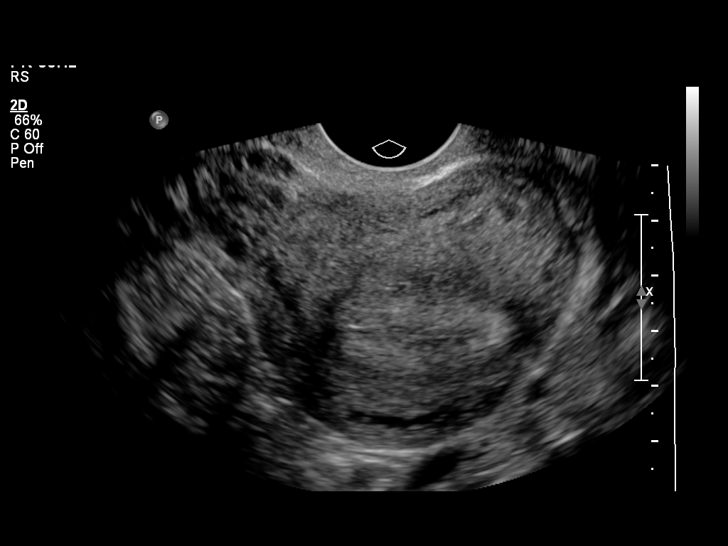
[im 12/28]
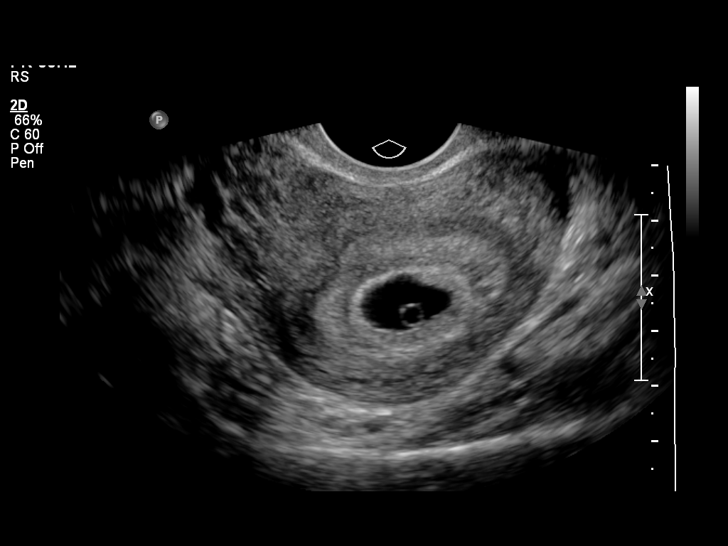
[im 14/28]
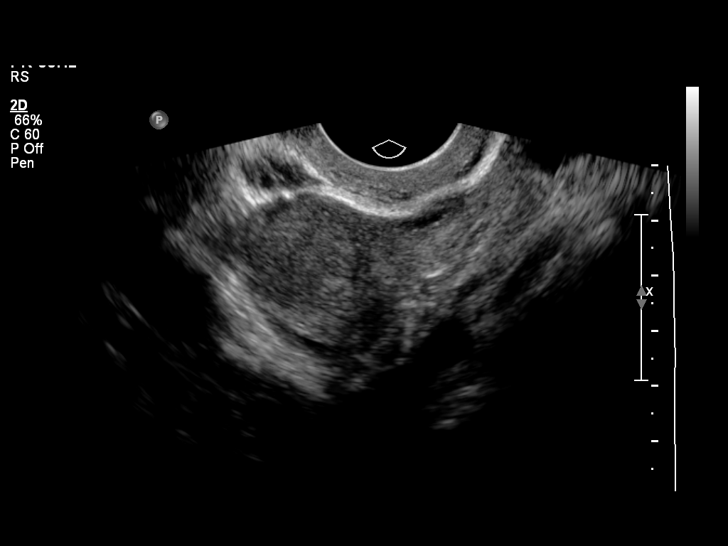
[im 16/28]
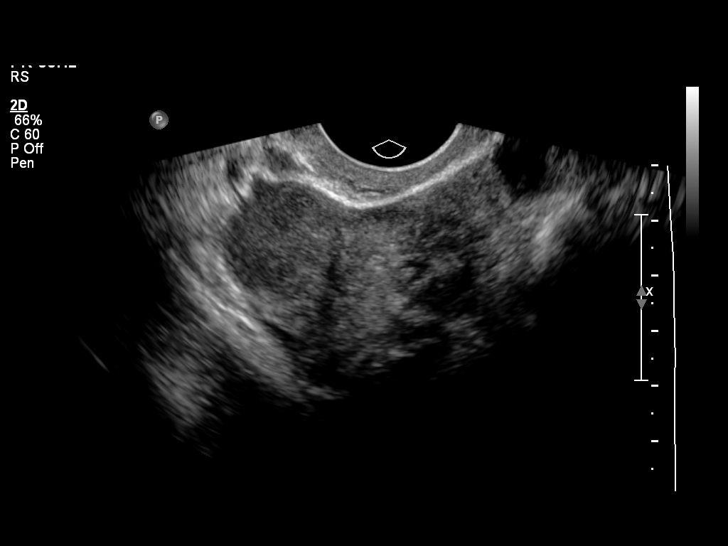
[im 18/28]
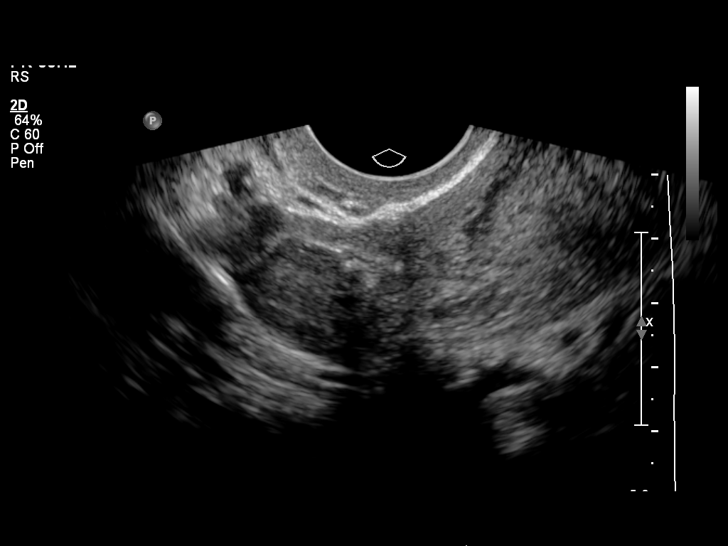
[im 20/28]
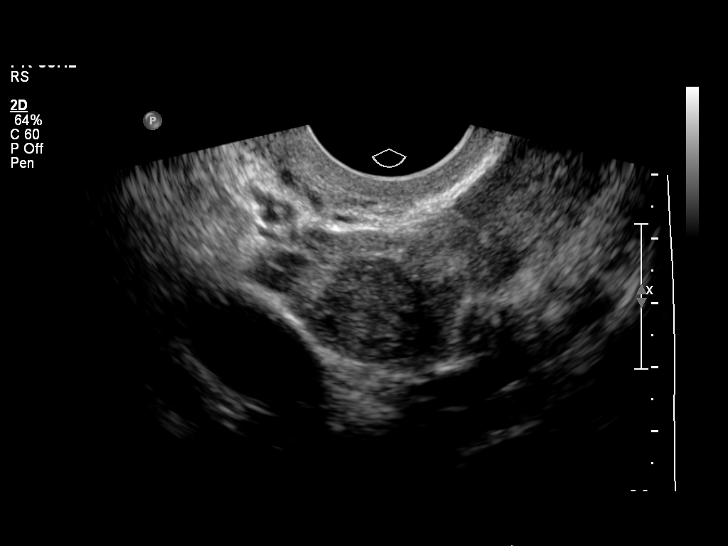
[im 22/28]
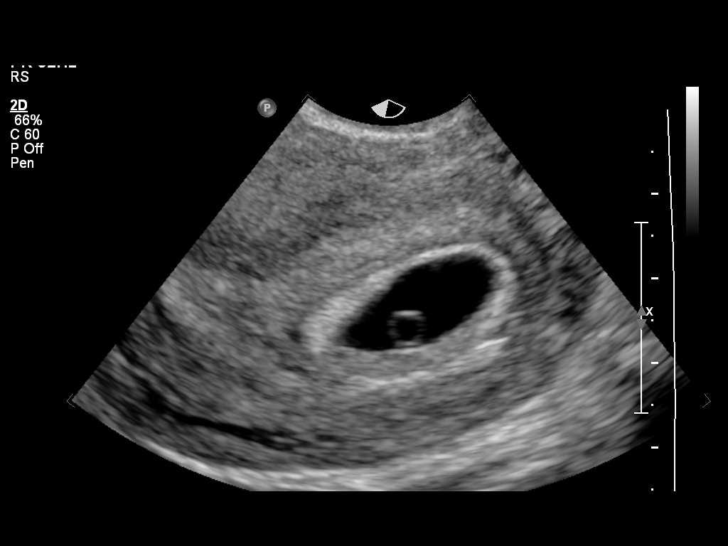
[im 24/28]
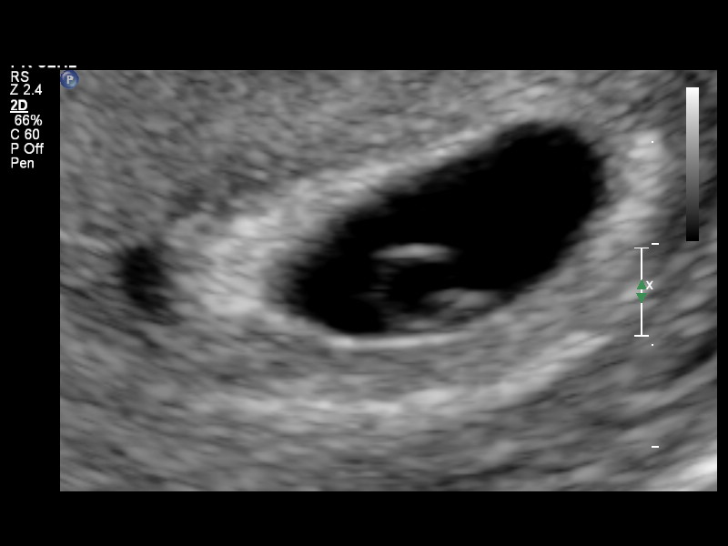
[im 26/28]
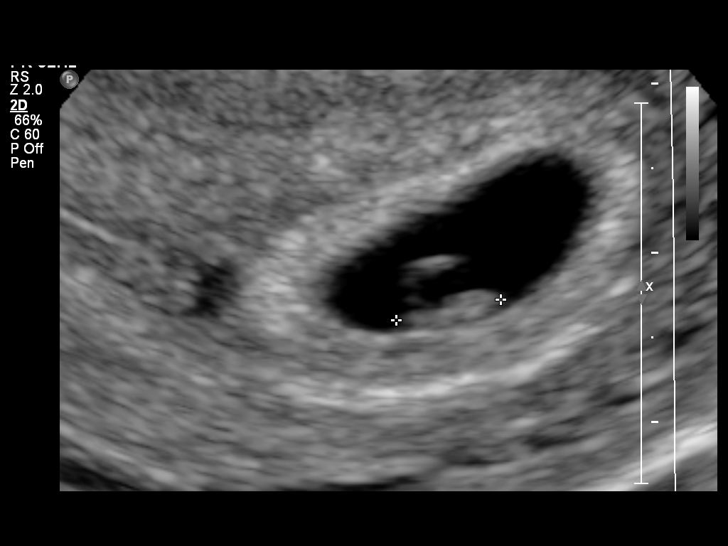
[im 28/28]
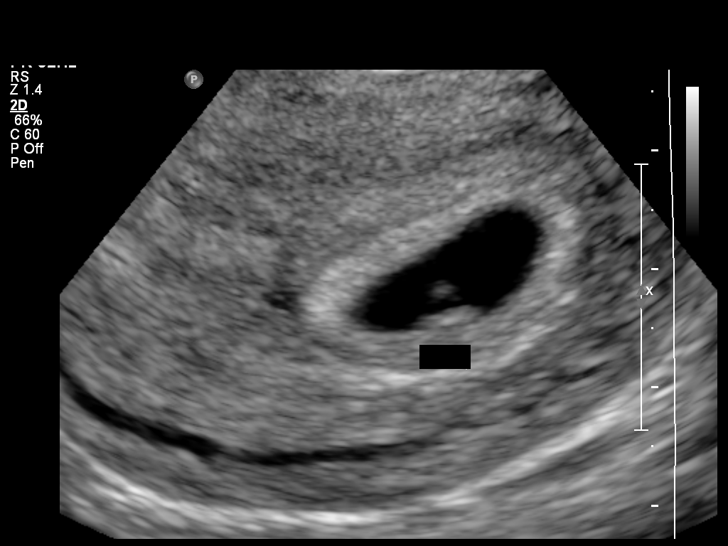

[14 of 28 positions shown; findings below may reference images not displayed]

FINDINGS: Intrauterine gestational sac: Visualized/normal in shape.

Yolk sac:  Visualized

Embryo:  Visualized

Cardiac Activity: Visualized

Heart Rate: 100 bpm

CRL:   6  mm   6 w 4 d                  US EDC: December 28, 2015

Maternal uterus/adnexae: There is a 4 x 3 mm subchorionic
hemorrhage. Cervical os is closed. The maternal ovaries are normal
in size and contour without extrauterine mass on either side. There
is no free pelvic fluid.
IMPRESSION: Single live intrauterine gestation with estimated gestational age of
6+ weeks. It is felt that the patient had a spontaneous abortion
shortly after the February 2015 study and is now pregnant with an
intrauterine gestation, currently visualized. There is a small
subchorionic hemorrhage. Study otherwise unremarkable.

## 2016-04-22 DIAGNOSIS — Z719 Counseling, unspecified: Secondary | ICD-10-CM

## 2016-05-09 NOTE — Congregational Nurse Program (Unsigned)
Congregational Nurse Program Note  Date of Encounter: 04/22/2016  Past Medical History: Past Medical History:  Diagnosis Date  . ADHD (attention deficit hyperactivity disorder)    since childhood  . Asthma    inhaler last used prior to pregnancy  . BV (bacterial vaginosis)   . Chlamydia   . GERD (gastroesophageal reflux disease)   . Gonorrhea   . Hyperemesis    G3    Encounter Details:     CNP Questionnaire - 04/22/16 2247      Patient Demographics   Race African-American/Black     Patient Assistance   Location of Patient Assistance [facility]   Patient's financial/insurance status Medicaid   Uninsured Patient No   Patient referred to apply for the following financial assistance Not Applicable   Food insecurities addressed Referred to food bank or resource   Transportation assistance No   Assistance securing medications No   Educational health offerings Other  financial need     Encounter Details   Primary purpose of visit Education/Health Concerns;Spiritual Care/Support Visit   Was an Emergency Department visit averted? No   Does patient have a medical provider? Yes   Patient referred to Other (comment)  resource for diapers   Was a mental health screening completed? (GAINS tool) No   Does patient have dental issues? No   Does patient have vision issues? Yes   Was a vision referral made? No   Does your patient have an abnormal blood pressure today? No   Since previous encounter, have you referred patient for abnormal blood pressure that resulted in a new diagnosis or medication change? No   Does your patient have an abnormal blood glucose today? No   Since previous encounter, have you referred patient for abnormal blood glucose that resulted in a new diagnosis or medication change? No   Was there a life-saving intervention made? No      Client delivered baby and [facility]  She is in need of help with diapers.   Gave 20 dollars given by donor to get diapers until we can get resource for same.  To followup as needed.

## 2016-05-20 ENCOUNTER — Telehealth: Payer: Self-pay

## 2016-05-21 NOTE — Telephone Encounter (Signed)
TC to client to let her know that a donor had purchased a gift card for her to get diapers for her children.

## 2016-05-28 DIAGNOSIS — Z719 Counseling, unspecified: Secondary | ICD-10-CM

## 2017-05-11 ENCOUNTER — Ambulatory Visit (INDEPENDENT_AMBULATORY_CARE_PROVIDER_SITE_OTHER): Payer: Medicaid Other | Admitting: Podiatry

## 2017-05-11 DIAGNOSIS — M2142 Flat foot [pes planus] (acquired), left foot: Secondary | ICD-10-CM

## 2017-05-11 DIAGNOSIS — M2141 Flat foot [pes planus] (acquired), right foot: Secondary | ICD-10-CM | POA: Diagnosis not present

## 2017-05-17 NOTE — Progress Notes (Signed)
   Subjective:  Patient presents for evaluation of bilateral lower extremity pain. Patient experiences moderate pain upon activity and long periods of walking. Patient presents today for further treatment and evaluation    Objective/Physical Exam General: The patient is alert and oriented x3 in no acute distress.  Dermatology: Skin is warm, dry and supple bilateral lower extremities. Negative for open lesions or macerations.  Vascular: Palpable pedal pulses bilaterally. No edema or erythema noted. Capillary refill within normal limits.  Neurological: Epicritic and protective threshold grossly intact bilaterally.   Musculoskeletal Exam: Range of motion within normal limits to all pedal and ankle joints bilateral. Muscle strength 5/5 in all groups bilateral.  Upon weightbearing there is a medial longitudinal arch collapse bilaterally. Remove foot valgus noted to the bilateral lower extremities with excessive pronation upon mid stance.  Radiographic Exam:  Normal osseous mineralization. Joint spaces preserved. No fracture/dislocation/boney destruction.   Pes planus noted on radiographic exam lateral views. Decreased calcaneal inclination and metatarsal declination angle is noted. Anterior break in the cyma line noted on lateral views. Medial talar head to deviation noted on AP radiograph.   Assessment: #1 pes planus bilateral #2 pain in bilateral feet   Plan of Care:  #1 Patient was evaluated. #2 today we are going to arrange an appointment with Deanna Myers, Pedorthist for custom molded orthotics. This should help significantly to alleviate symptoms regarding the pes planus deformity. #3 return to clinic when necessary   Deanna Myers, DPM [facility]  Deanna Myers, DPM    [address]                                        [place], [place] [address]                Office [phone]  Fax [phone]

## 2017-07-03 ENCOUNTER — Encounter (HOSPITAL_COMMUNITY): Payer: Self-pay | Admitting: Emergency Medicine

## 2017-07-03 ENCOUNTER — Emergency Department (HOSPITAL_COMMUNITY)
Admission: EM | Admit: 2017-07-03 | Discharge: 2017-07-03 | Disposition: A | Discharge status: Home / Self Care | Payer: Self-pay | Attending: Emergency Medicine | Admitting: Emergency Medicine

## 2017-07-03 DIAGNOSIS — J45909 Unspecified asthma, uncomplicated: Secondary | ICD-10-CM | POA: Insufficient documentation

## 2017-07-03 DIAGNOSIS — Z79899 Other long term (current) drug therapy: Secondary | ICD-10-CM | POA: Insufficient documentation

## 2017-07-03 DIAGNOSIS — K2970 Gastritis, unspecified, without bleeding: Secondary | ICD-10-CM | POA: Insufficient documentation

## 2017-07-03 DIAGNOSIS — Z91040 Latex allergy status: Secondary | ICD-10-CM | POA: Insufficient documentation

## 2017-07-03 LAB — COMPREHENSIVE METABOLIC PANEL
ALBUMIN: 3.7 g/dL (ref 3.5–5.0)
ALK PHOS: 43 U/L (ref 38–126)
ALT: 16 U/L (ref 14–54)
AST: 21 U/L (ref 15–41)
Anion gap: 8 (ref 5–15)
BUN: 9 mg/dL (ref 6–20)
CALCIUM: 8.9 mg/dL (ref 8.9–10.3)
CO2: 21 mmol/L — AB (ref 22–32)
CREATININE: 0.83 mg/dL (ref 0.44–1.00)
Chloride: 106 mmol/L (ref 101–111)
GFR calc Af Amer: >60 mL/min (ref >60)
GFR calc non Af Amer: >60 mL/min (ref >60)
GLUCOSE: 128 mg/dL — AB (ref 65–99)
Potassium: 3.8 mmol/L (ref 3.5–5.1)
SODIUM: 135 mmol/L (ref 135–145)
Total Bilirubin: 0.6 mg/dL (ref 0.3–1.2)
Total Protein: 7 g/dL (ref 6.5–8.1)

## 2017-07-03 LAB — CBC
HCT: 35.5 % — ABNORMAL LOW (ref 36.0–46.0)
HEMOGLOBIN: 11.6 g/dL — AB (ref 12.0–15.0)
MCH: 27.4 pg (ref 26.0–34.0)
MCHC: 32.7 g/dL (ref 30.0–36.0)
MCV: 83.7 fL (ref 78.0–100.0)
PLATELETS: 317 10*3/uL (ref 150–400)
RBC: 4.24 MIL/uL (ref 3.87–5.11)
RDW: 14.1 % (ref 11.5–15.5)
WBC: 5.9 10*3/uL (ref 4.0–10.5)

## 2017-07-03 LAB — LIPASE, BLOOD: Lipase: 29 U/L (ref 11–51)

## 2017-07-03 LAB — URINALYSIS, ROUTINE W REFLEX MICROSCOPIC
Bacteria, UA: NONE SEEN
Bilirubin Urine: NEGATIVE
GLUCOSE, UA: NEGATIVE mg/dL
Hgb urine dipstick: NEGATIVE
Ketones, ur: NEGATIVE mg/dL
Nitrite: NEGATIVE
PROTEIN: NEGATIVE mg/dL
Specific Gravity, Urine: 1.03 (ref 1.005–1.030)
pH: 5 (ref 5.0–8.0)

## 2017-07-03 LAB — POC URINE PREG, ED: Preg Test, Ur: NEGATIVE

## 2017-07-03 MED ORDER — POLYETHYLENE GLYCOL 3350 17 GM/SCOOP PO POWD: 17.0000 g | Freq: Every day | ORAL | 0 refills | Status: DC | PRN

## 2017-07-03 MED ORDER — OMEPRAZOLE 20 MG PO CPDR: 20.0000 mg | DELAYED_RELEASE_CAPSULE | Freq: Two times a day (BID) | ORAL | 1 refills | Status: DC

## 2017-07-03 NOTE — Discharge Instructions (Signed)
Prilosec until your symptoms improve.  MiraLAX daily until having regular bowel movements.

## 2017-07-03 NOTE — ED Provider Notes (Signed)
[facility]-EMERGENCY DEPT Provider Note   CSN: [id number] Arrival date & time: 07/03/17  1548     History   Chief Complaint Chief Complaint  Patient presents with  . Abdominal Pain    HPI Deanna Myers is a 26 y.o. female.Chief complaint is intermittent abdominal pain  HPI 26 year old female. Intermittent upper abdominal pain for the last 2 weeks. Has been constipated for last 2 days. Her intermittent symptoms preceded this. She feels like it is what she is to fill when she had gastritis or reflux with her prior pregnancies. Denies pregnancy now. Last patency 3 years ago. No diarrhea. No vomiting. No localized pain. No fever. Feels full after eating.  Past Medical History:  Diagnosis Date  . ADHD (attention deficit hyperactivity disorder)    since childhood  . Asthma    inhaler last used prior to pregnancy  . BV (bacterial vaginosis)   . Chlamydia   . GERD (gastroesophageal reflux disease)   . Gonorrhea   . Hyperemesis    G3    Patient Active Problem List   Diagnosis Date Noted  . Active labor at term 12/21/2015  . UTI (urinary tract infection) 06/13/2015  . Pyelonephritis affecting pregnancy in first trimester 06/12/2015  . Folliculitis 02/13/2013    Past Surgical History:  Procedure Laterality Date  . CESAREAN SECTION N/A 12/21/2015   Procedure: CESAREAN SECTION;  Surgeon: [name], MD;  Location: [facility];  Service: Obstetrics;  Laterality: N/A;  . NO PAST SURGERIES      OB History    Gravida Para Term Preterm AB Living   SAB TAB Ectopic Multiple Live Births   1     0 2       Home Medications    Prior to Admission medications   Medication Sig Start Date End Date Taking? Authorizing Provider  albuterol (PROVENTIL HFA;VENTOLIN HFA) 108 (90 BASE) MCG/ACT inhaler Inhale 1-2 puffs into the lungs every 6 (six) hours as needed for wheezing or shortness of breath.    [provider]  EPINEPHrine 0.3 mg/0.3 mL IJ SOAJ injection Inject  0.3 mg into the muscle once.    [provider]  ibuprofen (ADVIL,MOTRIN) 600 MG tablet Take 1 tablet (600 mg total) by mouth every 6 (six) hours. 12/24/15   [name], MD  omeprazole (PRILOSEC) 20 MG capsule Take 1 capsule (20 mg total) by mouth 2 (two) times daily. 07/03/17   [name], MD  oxyCODONE 10 MG TABS Take 1 tablet (10 mg total) by mouth every 4 (four) hours as needed (pain scale > 7). 12/24/15   [name], MD  polyethylene glycol powder (GLYCOLAX/MIRALAX) powder Take 17 g by mouth daily as needed for mild constipation. 07/03/17   [name], MD  Prenatal Multivit-Min-Fe-FA (PRENATAL VITAMINS) 0.8 MG tablet Take 1 tablet by mouth daily. Patient taking differently: Take 1 tablet by mouth daily.  02/18/15   Marlis Edelson, CNM    Family History Family History  Problem Relation Age of Onset  . Hypertension Father   . Hypertension Mother   . Cervical cancer Paternal Aunt   . Congestive Heart Failure Maternal Grandmother     Social History Social History  Substance Use Topics  . Smoking status: Never Smoker  . Smokeless tobacco: Never Used  . Alcohol use Yes     Comment: not while pregnant      Allergies   Shellfish allergy; Adhesive [tape]; Latex; and Betadine [  povidone iodine]   Review of Systems Review of Systems  Constitutional: Negative for appetite change, chills, diaphoresis, fatigue and fever.  HENT: Negative for mouth sores, sore throat and trouble swallowing.   Eyes: Negative for visual disturbance.  Respiratory: Negative for cough, chest tightness, shortness of breath and wheezing.   Cardiovascular: Negative for chest pain.  Gastrointestinal: Positive for abdominal pain and constipation. Negative for abdominal distention, diarrhea, nausea and vomiting.  Endocrine: Negative for polydipsia, polyphagia and polyuria.  Genitourinary: Negative for dysuria, frequency and hematuria.  Musculoskeletal: Negative for gait problem.  Skin:  Negative for color change, pallor and rash.  Neurological: Negative for dizziness, syncope, light-headedness and headaches.  Hematological: Does not bruise/bleed easily.  Psychiatric/Behavioral: Negative for behavioral problems and confusion.     Physical Exam Updated Vital Signs BP 103/61   Pulse 80   Temp 99 F (37.2 C) (Oral)   Resp 16   Ht  (1.676 m)   Wt 99.8 kg (220 lb)   SpO2 99%   BMI 35.51 kg/m   Physical Exam  Constitutional: She is oriented to person, place, and time. She appears well-developed and well-nourished. No distress.  HENT:  Head: Normocephalic.  Eyes: Pupils are equal, round, and reactive to light. Conjunctivae are normal. No scleral icterus.  Neck: Normal range of motion. Neck supple. No thyromegaly present.  Cardiovascular: Normal rate and regular rhythm.  Exam reveals no gallop and no friction rub.   No murmur heard. Pulmonary/Chest: Effort normal and breath sounds normal. No respiratory distress. She has no wheezes. She has no rales.  Abdominal: Soft. Bowel sounds are normal. She exhibits no distension. There is no tenderness. There is no rebound.  Patient cannot find a spot on her abdomen that she is confident is a area where she is painful. She has no tenderness to examine  Musculoskeletal: Normal range of motion.  Neurological: She is alert and oriented to person, place, and time.  Skin: Skin is warm and dry. No rash noted.  Psychiatric: She has a normal mood and affect. Her behavior is normal.     ED Treatments / Results  Labs (all labs ordered are listed, but only abnormal results are displayed) Labs Reviewed  COMPREHENSIVE METABOLIC PANEL - Abnormal; Notable for the following:       Result Value   CO2 21 (*)    Glucose, Bld 128 (*)    All other components within normal limits  CBC - Abnormal; Notable for the following:    Hemoglobin 11.6 (*)    HCT 35.5 (*)    All other components within normal limits  URINALYSIS, ROUTINE W  REFLEX MICROSCOPIC - Abnormal; Notable for the following:    APPearance HAZY (*)    Leukocytes, UA TRACE (*)    Squamous Epithelial / LPF 0-5 (*)    All other components within normal limits  LIPASE, BLOOD  POC URINE PREG, ED    EKG  EKG Interpretation None       Radiology No results found.  Procedures Procedures (including critical care time)  Medications Ordered in ED Medications - No data to display   Initial Impression / Assessment and Plan / ED Course  I have reviewed the triage vital signs and the nursing notes.  Pertinent labs & imaging results that were available during my care of the patient were reviewed by me and considered in my medical decision making (see chart for details).     No GU symptoms. Non infected urine. Reassuring  labs. Will treat with Prilosec. MiraLAX prn constipation.  Final Clinical Impressions(s) / ED Diagnoses   Final diagnoses:  Gastritis without bleeding, unspecified chronicity, unspecified gastritis type    New Prescriptions New Prescriptions   OMEPRAZOLE (PRILOSEC) 20 MG CAPSULE    Take 1 capsule (20 mg total) by mouth 2 (two) times daily.   POLYETHYLENE GLYCOL POWDER (GLYCOLAX/MIRALAX) POWDER    Take 17 g by mouth daily as needed for mild constipation.     [name], MD 07/03/17 2033

## 2017-07-03 NOTE — ED Triage Notes (Signed)
C/o constant RLQ pain x 2 weeks that is getting worse.  Denies nausea, vomiting, urinary complaints, and diarrhea.  Last BM yesterday- states she is "a little" constipated.

## 2017-07-03 NOTE — ED Notes (Signed)
Informed pt we need urine sample, pt unable to void at this time

## 2018-02-10 ENCOUNTER — Ambulatory Visit (INDEPENDENT_AMBULATORY_CARE_PROVIDER_SITE_OTHER): Payer: Medicaid Other | Admitting: Neurology

## 2018-02-10 ENCOUNTER — Encounter: Payer: Self-pay | Admitting: Neurology

## 2018-02-10 VITALS — BP 100/58

## 2018-02-10 DIAGNOSIS — G43119 Migraine with aura, intractable, without status migrainosus: Secondary | ICD-10-CM | POA: Diagnosis not present

## 2018-02-10 MED ORDER — SUMATRIPTAN SUCCINATE 100 MG PO TABS: ORAL_TABLET | ORAL | 2 refills | Status: DC

## 2018-02-10 NOTE — Progress Notes (Signed)
NEUROLOGY CONSULTATION NOTE  GHADA ABBETT MRN: [id number] DOB: [birth year]  Referring provider: Hillery Jacks Primary care provider: Hillery Jacks  Reason for consult:  headache  HISTORY OF PRESENT ILLNESS: Deanna Myers is a 27 year old female who presents for headache.  History supplemented by PCP note.  Onset:  2015 following her first pregnancy.  Infrequent until this past year. Location:  Varies (frontal, parietal, occipital) Quality:  Undulating, pulsating, stabbing Intensity:  severe.  She denies new headache, thunderclap headache or severe headache that wakes her from sleep. Aura:  Monocular vision loss/blurred vision in either eye Prodrome:  Dull headache for 3 days Postdrome:  no Associated symptoms:  Nausea, photophobia, phonophobia, sometimes vomiting.  She denies associated unilateral numbness or weakness. Duration:  1 to 2 days Frequency:  4 times a month Frequency of abortive medication: as needed Triggers/exacerbating factors:  unknown Relieving factors:  Laying down in dark Activity:  aggravates  Current NSAIDS:  naproxen  Current analgesics:  Fioricet (prescribed yesterday, has not started) Current triptans:  no Current anti-emetic:  no Current muscle relaxants:  no Current anti-anxiolytic:  no Current sleep aide:  no Current Antihypertensive medications:  no Current Antidepressant medications:  no Current Anticonvulsant medications:  no Current Vitamins/Herbal/Supplements:  no Current Antihistamines/Decongestants:  no Other therapy:  no  Past NSAIDS:  Ibuprofen Past analgesics:  Excedrin, Tylenol, Extra-strength Tylenol Past abortive triptans:  no Past muscle relaxants:  no Past anti-emetic:  no Past antihypertensive medications:  no Past antidepressant medications:  no Past anticonvulsant medications:  no Past vitamins/Herbal/Supplements:  no Past antihistamines/decongestants:  no Other past therapies:  no  Caffeine:  Occasional  soda or coffee Alcohol:  occasional Smoker:  no Diet:  Gatorade, hydrates Exercise:  yes Depression:  no; Anxiety:  no Other pain:  no Sleep hygiene:  good Family history of headache:  Mother had migraines  PAST MEDICAL HISTORY: Past Medical History:  Diagnosis Date  . ADHD (attention deficit hyperactivity disorder)    since childhood  . Asthma    inhaler last used prior to pregnancy  . BV (bacterial vaginosis)   . Chlamydia   . GERD (gastroesophageal reflux disease)   . Gonorrhea   . Hyperemesis    G3    PAST SURGICAL HISTORY: Past Surgical History:  Procedure Laterality Date  . CESAREAN SECTION N/A 12/21/2015   Procedure: CESAREAN SECTION;  Surgeon: [name], MD;  Location: [facility];  Service: Obstetrics;  Laterality: N/A;  . NO PAST SURGERIES      MEDICATIONS: Current Outpatient Medications on File Prior to Visit  Medication Sig Dispense Refill  . albuterol (PROVENTIL HFA;VENTOLIN HFA) 108 (90 BASE) MCG/ACT inhaler Inhale 1-2 puffs into the lungs every 6 (six) hours as needed for wheezing or shortness of breath.    . EPINEPHrine 0.3 mg/0.3 mL IJ SOAJ injection Inject 0.3 mg into the muscle once.    Marland Kitchen ibuprofen (ADVIL,MOTRIN) 600 MG tablet Take 1 tablet (600 mg total) by mouth every 6 (six) hours. 30 tablet 0  . omeprazole (PRILOSEC) 20 MG capsule Take 1 capsule (20 mg total) by mouth 2 (two) times daily. (Patient not taking: Reported on 02/10/2018) 60 capsule 1  . oxyCODONE 10 MG TABS Take 1 tablet (10 mg total) by mouth every 4 (four) hours as needed (pain scale > 7). (Patient not taking: Reported on 02/10/2018) 30 tablet 0  . polyethylene glycol powder (GLYCOLAX/MIRALAX) powder Take 17 g by mouth daily as needed for mild constipation. 255 g  0  . Prenatal Multivit-Min-Fe-FA (PRENATAL VITAMINS) 0.8 MG tablet Take 1 tablet by mouth daily. (Patient not taking: Reported on 02/10/2018) 30 tablet 12   No current facility-administered medications on file prior to visit.      ALLERGIES: Allergies  Allergen Reactions  . Shellfish Allergy Anaphylaxis  . Adhesive [Tape] Swelling and Other (See Comments)    Reaction:  Bruising   . Latex Hives  . Betadine [Povidone Iodine] Rash    FAMILY HISTORY: Family History  Problem Relation Age of Onset  . Hypertension Father   . Hypertension Mother   . Kidney disease Mother   . Cervical cancer Paternal Aunt   . Congestive Heart Failure Maternal Grandmother   . Breast cancer Paternal Grandfather     SOCIAL HISTORY: Social History   Socioeconomic History  . Marital status: Single    Spouse name: Not on file  . Number of children: 2  . Years of education: Not on file  . Highest education level: Some college, no degree  Occupational History  . Occupation: Camera operator  . Financial resource strain: Not on file  . Food insecurity:    Worry: Not on file    Inability: Not on file  . Transportation needs:    Medical: Not on file    Non-medical: Not on file  Tobacco Use  . Smoking status: Never Smoker  . Smokeless tobacco: Never Used  Substance and Sexual Activity  . Alcohol use: Yes    Comment: not while pregnant   . Drug use: No  . Sexual activity: Yes    Partners: Male    Birth control/protection: None    Comment: pregnant  Lifestyle  . Physical activity:    Days per week: Not on file    Minutes per session: Not on file  . Stress: Not on file  Relationships  . Social connections:    Talks on phone: Not on file    Gets together: Not on file    Attends religious service: Not on file    Active member of club or organization: Not on file    Attends meetings of clubs or organizations: Not on file    Relationship status: Not on file  . Intimate partner violence:    Fear of current or ex partner: Not on file    Emotionally abused: Not on file    Physically abused: Not on file    Forced sexual activity: Not on file  Other Topics Concern  . Not on file  Social History Narrative    Pt is right handed, lives with her 2 daughters in a 1 story story home. Rarely drinks caffeine. Is very active.    REVIEW OF SYSTEMS: Constitutional: No fevers, chills, or sweats, no generalized fatigue, change in appetite Eyes: No visual changes, double vision, eye pain Ear, nose and throat: No hearing loss, ear pain, nasal congestion, sore throat Cardiovascular: No chest pain, palpitations Respiratory:  No shortness of breath at rest or with exertion, wheezes GastrointestinaI: No nausea, vomiting, diarrhea, abdominal pain, fecal incontinence Genitourinary:  No dysuria, urinary retention or frequency Musculoskeletal:  No neck pain, back pain Integumentary: No rash, pruritus, skin lesions Neurological: as above Psychiatric: No depression, insomnia, anxiety Endocrine: No palpitations, fatigue, diaphoresis, mood swings, change in appetite, change in weight, increased thirst Hematologic/Lymphatic:  No purpura, petechiae. Allergic/Immunologic: no itchy/runny eyes, nasal congestion, recent allergic reactions, rashes  PHYSICAL EXAM: Vitals:   02/10/18 1447  BP: (!) 100/58   General:  No acute distress.  Patient appears well-groomed.   Head:  Normocephalic/atraumatic Eyes:  fundi examined but not visualized Neck: supple, no paraspinal tenderness, full range of motion Back: No paraspinal tenderness Heart: regular rate and rhythm Lungs: Clear to auscultation bilaterally. Vascular: No carotid bruits. Neurological Exam: Mental status: alert and oriented to person, place, and time, recent and remote memory intact, fund of knowledge intact, attention and concentration intact, speech fluent and not dysarthric, language intact. Cranial nerves: CN I: not tested CN II: pupils equal, round and reactive to light, visual fields intact CN III, IV, VI:  full range of motion, no nystagmus, no ptosis CN V: facial sensation intact CN VII: upper and lower face symmetric CN VIII: hearing intact CN IX,  X: gag intact, uvula midline CN XI: sternocleidomastoid and trapezius muscles intact CN XII: tongue midline Bulk & Tone: normal, no fasciculations. Motor:  5/5 throughout  Sensation:  temperature and vibration sensation intact. Deep Tendon Reflexes:  2+ throughout, toes downgoing.  Finger to nose testing:  Without dysmetria.  Heel to shin:  Without dysmetria.  Gait:  Normal station and stride.  Able to turn and tandem walk. Romberg negative.  IMPRESSION: Migraine with aura, intractable  PLAN: 1.  Stop naproxen and ibuprofen.  Do not start Fioricet 2.  At earliest onset of migraine, take sumatriptan , limited to no more than 2 days out of week to prevent rebound headache. 3.  Headache diary 4.  Consider magnesium citrate , riboflavin , CoQ10  three times daily 5.  Follow up in 3 months.  Thank you for allowing me to take part in the care of this patient.  45 minutes spent face to face with patient, over 50% spent discussing management.  Shon Millet, DO  CC:  Hillery Jacks

## 2018-02-10 NOTE — Patient Instructions (Signed)
Migraine Recommendations: 1.  Stop the naproxen and ibuprofen.  Do not take the Fioricet 2.  Take sumatriptan  at earliest onset of headache.  May repeat dose once in 2 hours if needed.  Do not exceed two tablets in 24 hours. 3.  Limit use of pain relievers to no more than 2 days out of the week.  These medications include acetaminophen, ibuprofen, triptans and narcotics.  This will help reduce risk of rebound headaches. 4.  Be aware of common food triggers such as processed sweets, processed foods with nitrites (such as deli meat, hot dogs, sausages), foods with MSG, alcohol (such as wine), chocolate, certain cheeses, certain fruits (dried fruits, bananas, some citrus fruit), vinegar, diet soda. 4.  Avoid caffeine 5.  Routine exercise 6.  Proper sleep hygiene 7.  Stay adequately hydrated with water 8.  Keep a headache diary. 9.  Maintain proper stress management. 10.  Do not skip meals. 11.  Consider supplements:  Magnesium citrate  to  daily, riboflavin , Coenzyme Q 10  three times daily   Migraine Headache A migraine headache is an intense, throbbing pain on one side or both sides of the head. Migraines may also cause other symptoms, such as nausea, vomiting, and sensitivity to light and noise. What are the causes? Doing or taking certain things may also trigger migraines, such as:  Alcohol.  Smoking.  Medicines, such as: ? Medicine used to treat chest pain (nitroglycerine). ? Birth control pills. ? Estrogen pills. ? Certain blood pressure medicines.  Aged cheeses, chocolate, or caffeine.  Foods or drinks that contain nitrates, glutamate, aspartame, or tyramine.  Physical activity.  Other things that may trigger a migraine include:  Menstruation.  Pregnancy.  Hunger.  Stress, lack of sleep, too much sleep, or fatigue.  Weather changes.  What increases the risk? The following factors may make you more likely to experience migraine  headaches:  Age. Risk increases with age.  Family history of migraine headaches.  Being Caucasian.  Depression and anxiety.  Obesity.  Being a woman.  Having a hole in the heart (patent foramen ovale) or other heart problems.  What are the signs or symptoms? The main symptom of this condition is pulsating or throbbing pain. Pain may:  Happen in any area of the head, such as on one side or both sides.  Interfere with daily activities.  Get worse with physical activity.  Get worse with exposure to bright lights or loud noises.  Other symptoms may include:  Nausea.  Vomiting.  Dizziness.  General sensitivity to bright lights, loud noises, or smells.  Before you get a migraine, you may get warning signs that a migraine is developing (aura). An aura may include:  Seeing flashing lights or having blind spots.  Seeing bright spots, halos, or zigzag lines.  Having tunnel vision or blurred vision.  Having numbness or a tingling feeling.  Having trouble talking.  Having muscle weakness.  How is this diagnosed? A migraine headache can be diagnosed based on:  Your symptoms.  A physical exam.  Tests, such as CT scan or MRI of the head. These imaging tests can help rule out other causes of headaches.  Taking fluid from the spine (lumbar puncture) and analyzing it (cerebrospinal fluid analysis, or CSF analysis).  How is this treated? A migraine headache is usually treated with medicines that:  Relieve pain.  Relieve nausea.  Prevent migraines from coming back.  Treatment may also include:  Acupuncture.  Lifestyle changes like  avoiding foods that trigger migraines.  Follow these instructions at home: Medicines  Take over-the-counter and prescription medicines only as told by your health care provider.  Do not drive or use heavy machinery while taking prescription pain medicine.  To prevent or treat constipation while you are taking prescription pain  medicine, your health care provider may recommend that you: ? Drink enough fluid to keep your urine clear or pale yellow. ? Take over-the-counter or prescription medicines. ? Eat foods that are high in fiber, such as fresh fruits and vegetables, whole grains, and beans. ? Limit foods that are high in fat and processed sugars, such as fried and sweet foods. Lifestyle  Avoid alcohol use.  Do not use any products that contain nicotine or tobacco, such as cigarettes and e-cigarettes. If you need help quitting, ask your health care provider.  Get at least 8 hours of sleep every night.  Limit your stress. General instructions   Keep a journal to find out what may trigger your migraine headaches. For example, write down: ? What you eat and drink. ? How much sleep you get. ? Any change to your diet or medicines.  If you have a migraine: ? Avoid things that make your symptoms worse, such as bright lights. ? It may help to lie down in a dark, quiet room. ? Do not drive or use heavy machinery. ? Ask your health care provider what activities are safe for you while you are experiencing symptoms.  Keep all follow-up visits as told by your health care provider. This is important. Contact a health care provider if:  You develop symptoms that are different or more severe than your usual migraine symptoms. Get help right away if:  Your migraine becomes severe.  You have a fever.  You have a stiff neck.  You have vision loss.  Your muscles feel weak or like you cannot control them.  You start to lose your balance often.  You develop trouble walking.  You faint. This information is not intended to replace advice given to you by your health care provider. Make sure you discuss any questions you have with your health care provider. Document Released: 09/20/2005 Document Revised: 04/09/2016 Document Reviewed: 03/08/2016 Elsevier Interactive Patient Education  2017 [employer].

## 2018-03-09 LAB — RESULTS CONSOLE HPV: CHL HPV: NEGATIVE

## 2018-05-08 ENCOUNTER — Encounter

## 2018-05-08 ENCOUNTER — Ambulatory Visit: Payer: Medicaid Other | Admitting: Neurology

## 2018-05-22 ENCOUNTER — Ambulatory Visit: Payer: Medicaid Other | Admitting: Neurology

## 2018-06-29 ENCOUNTER — Ambulatory Visit: Payer: Medicaid Other | Admitting: Neurology

## 2018-06-30 NOTE — Progress Notes (Signed)
NEUROLOGY FOLLOW UP OFFICE NOTE  Deanna Myers [id number]  HISTORY OF PRESENT ILLNESS: Deanna Myers is a 27 year old female who follows up for migraines.  UPDATE: Doing well until late-August and early-September.  She reports increased stress at that time.  She started having more nausea with the migraines. Intensity:  severe Duration:  Usually sumatriptan effective until this past month.  2 hours for sumatriptan to kick in but may last 6 hours. Frequency:  6 days in 30 days Current NSAIDS:  no Current analgesics:  no Current triptans:  Sumatriptan 100mg  Current ergotamine:  no Current anti-emetic:  no Current muscle relaxants:  no Current anti-anxiolytic:  no Current sleep aide:  no Current Antihypertensive medications:  no Current Antidepressant medications:  no Current Anticonvulsant medications:  no Current anti-CGRP:  no Current Vitamins/Herbal/Supplements:  no Current Antihistamines/Decongestants:  no Other therapy:  no Other medication:  no  Caffeine:  Occasional soda or coffee Alcohol:  occasional Smoker:  no Diet:  Hydrates, Gatorade Exercise:  yes Depression:  no; Anxiety:  yes Other pain:  no Sleep hygiene:  good  HISTORY: Onset:  2015, following her first pregnancy.  Infrequent until this past year. Location:  Varies (frontal, parietal, occipital) Quality:  Undulating, pulsating, stabbing Initial intensity:  severe.  She denies new headache, thunderclap headache or severe headache that wakes her from sleep. Aura:  Monocular vision loss/blurred vision in either eye Prodrome:  Dull headache for 3 days Postdrome:  no Associated symptoms:  Nausea, photophobia, phonophobia, sometimes vomiting  She denies associated unilateral numbness or weakness. Initial Duration:  1 to 2 days Initial Frequency:  4 times a month Initial Frequency of abortive medication: as needed Triggers/aggravating factors:  Unknown Relieving factors:  Laying down in  dark Activity:  aggravates  Past NSAIDS:  Ibuprofen, naproxen Past analgesics:  Excedrin, Tylenol, Extra-strength Tylenol Past abortive triptans:  no Past muscle relaxants:  no Past anti-emetic:  no Past antihypertensive medications:  no Past antidepressant medications:  no Past anticonvulsant medications:  no Past vitamins/Herbal/Supplements:  no Past antihistamines/decongestants:  no Other past therapies:  no  Family history of headache:  Mother had migraines  PAST MEDICAL HISTORY: Past Medical History:  Diagnosis Date  . ADHD (attention deficit hyperactivity disorder)    since childhood  . Asthma    inhaler last used prior to pregnancy  . BV (bacterial vaginosis)   . Chlamydia   . GERD (gastroesophageal reflux disease)   . Gonorrhea   . Hyperemesis    G3    MEDICATIONS: Current Outpatient Medications on File Prior to Visit  Medication Sig Dispense Refill  . albuterol (PROVENTIL HFA;VENTOLIN HFA) 108 (90 BASE) MCG/ACT inhaler Inhale 1-2 puffs into the lungs every 6 (six) hours as needed for wheezing or shortness of breath.    . EPINEPHrine 0.3 mg/0.3 mL IJ SOAJ injection Inject 0.3 mg into the muscle once.    Marland Kitchen ibuprofen (ADVIL,MOTRIN) 600 MG tablet Take 1 tablet (600 mg total) by mouth every 6 (six) hours. 30 tablet 0  . omeprazole (PRILOSEC) 20 MG capsule Take 1 capsule (20 mg total) by mouth 2 (two) times daily. (Patient not taking: Reported on 02/10/2018) 60 capsule 1  . oxyCODONE 10 MG TABS Take 1 tablet (10 mg total) by mouth every 4 (four) hours as needed (pain scale > 7). (Patient not taking: Reported on 02/10/2018) 30 tablet 0  . polyethylene glycol powder (GLYCOLAX/MIRALAX) powder Take 17 g by mouth daily as needed for mild constipation. 255  g 0  . Prenatal Multivit-Min-Fe-FA (PRENATAL VITAMINS) 0.8 MG tablet Take 1 tablet by mouth daily. (Patient not taking: Reported on 02/10/2018) 30 tablet 12  . SUMAtriptan (IMITREX) 100 MG tablet Take 1 tablet earliest onset  of migraine.  May repeat once in 2 hours if headache persists or recurs.  Do not exceed 2 tablets in 24 hours 10 tablet 2   No current facility-administered medications on file prior to visit.     ALLERGIES: Allergies  Allergen Reactions  . Shellfish Allergy Anaphylaxis  . Adhesive [Tape] Swelling and Other (See Comments)    Reaction:  Bruising   . Latex Hives  . Betadine [Povidone Iodine] Rash    FAMILY HISTORY: Family History  Problem Relation Age of Onset  . Hypertension Father   . Hypertension Mother   . Kidney disease Mother   . Cervical cancer Paternal Aunt   . Congestive Heart Failure Maternal Grandmother   . Breast cancer Paternal Grandfather    SOCIAL HISTORY: Social History   Socioeconomic History  . Marital status: Single    Spouse name: Not on file  . Number of children: 2  . Years of education: Not on file  . Highest education level: Some college, no degree  Occupational History  . Occupation: Camera operator  . Financial resource strain: Not on file  . Food insecurity:    Worry: Not on file    Inability: Not on file  . Transportation needs:    Medical: Not on file    Non-medical: Not on file  Tobacco Use  . Smoking status: Never Smoker  . Smokeless tobacco: Never Used  Substance and Sexual Activity  . Alcohol use: Yes    Comment: not while pregnant   . Drug use: No  . Sexual activity: Yes    Partners: Male    Birth control/protection: None    Comment: pregnant  Lifestyle  . Physical activity:    Days per week: Not on file    Minutes per session: Not on file  . Stress: Not on file  Relationships  . Social connections:    Talks on phone: Not on file    Gets together: Not on file    Attends religious service: Not on file    Active member of club or organization: Not on file    Attends meetings of clubs or organizations: Not on file    Relationship status: Not on file  . Intimate partner violence:    Fear of current or ex partner:  Not on file    Emotionally abused: Not on file    Physically abused: Not on file    Forced sexual activity: Not on file  Other Topics Concern  . Not on file  Social History Narrative   Pt is right handed, lives with her 2 daughters in a 1 story story home. Rarely drinks caffeine. Is very active.    REVIEW OF SYSTEMS: Constitutional: No fevers, chills, or sweats, no generalized fatigue, change in appetite Eyes: No visual changes, double vision, eye pain Ear, nose and throat: No hearing loss, ear pain, nasal congestion, sore throat Cardiovascular: No chest pain, palpitations Respiratory:  No shortness of breath at rest or with exertion, wheezes GastrointestinaI: No nausea, vomiting, diarrhea, abdominal pain, fecal incontinence Genitourinary:  No dysuria, urinary retention or frequency Musculoskeletal:  No neck pain, back pain Integumentary: No rash, pruritus, skin lesions Neurological: as above Psychiatric: No depression, insomnia, anxiety Endocrine: No palpitations, fatigue, diaphoresis, mood swings,  change in appetite, change in weight, increased thirst Hematologic/Lymphatic:  No purpura, petechiae. Allergic/Immunologic: no itchy/runny eyes, nasal congestion, recent allergic reactions, rashes  PHYSICAL EXAM: Blood pressure 100/60, pulse 74, height 5\' 6"  (1.676 m), weight 206 lb (93.4 kg), SpO2 99 %, unknown if currently breastfeeding. General: No acute distress.  Patient appears well-groomed.   Head:  Normocephalic/atraumatic Eyes:  Fundi examined but not visualized Neck: supple, no paraspinal tenderness, full range of motion Heart:  Regular rate and rhythm Lungs:  Clear to auscultation bilaterally Back: No paraspinal tenderness Neurological Exam: alert and oriented to person, place, and time. Attention span and concentration intact, recent and remote memory intact, fund of knowledge intact.  Speech fluent and not dysarthric, language intact.  CN II-XII intact. Bulk and tone  normal, muscle strength 5/5 throughout.  Sensation to light touch  intact.  Deep tendon reflexes 2+ throughout, toes downgoing.  Finger to nose testing intact.  Gait normal, Romberg negative.  IMPRESSION: Episodic migraine without aura, without status migrainosus, not intractable  PLAN: 1.  We will still hold starting a preventative. 2.  For abortive therapy, sumatriptan 100mg  with naproxen 500mg .  Zofran 4mg  for nausea. 3.  Limit use of pain relievers to no more than 2 days out of week to prevent risk of rebound or medication-overuse headache. 4.  Keep headache diary 5.  Exercise, hydration, caffeine cessation, sleep hygiene, monitor for and avoid triggers 6.  Consider:  magnesium citrate 400mg  daily, riboflavin 400mg  daily, and coenzyme Q10 100mg  three times daily 7.  Follow up 3 to 4 months   Shon Millet, DO  CC: Hillery Jacks, NP

## 2018-07-03 ENCOUNTER — Ambulatory Visit (INDEPENDENT_AMBULATORY_CARE_PROVIDER_SITE_OTHER): Payer: Medicaid Other | Admitting: Neurology

## 2018-07-03 ENCOUNTER — Encounter: Payer: Self-pay | Admitting: Neurology

## 2018-07-03 VITALS — BP 100/60 | HR 74 | Ht 66.0 in | Wt 206.0 lb

## 2018-07-03 DIAGNOSIS — G43119 Migraine with aura, intractable, without status migrainosus: Secondary | ICD-10-CM | POA: Diagnosis not present

## 2018-07-03 MED ORDER — ONDANSETRON HCL 4 MG PO TABS: 4.0000 mg | ORAL_TABLET | Freq: Three times a day (TID) | ORAL | 2 refills | Status: DC | PRN

## 2018-07-03 MED ORDER — NAPROXEN 500 MG PO TABS: 500.0000 mg | ORAL_TABLET | Freq: Two times a day (BID) | ORAL | 2 refills | Status: DC | PRN

## 2018-07-03 NOTE — Patient Instructions (Signed)
Migraine Recommendations: 1.  Hold starting a preventative 2.  Take sumatriptan 100mg  with naproxen 500mg  at earliest onset of headache.  May repeat dose once in 2 hours if needed.  Do not exceed two doses in 24 hours.  For nausea take ondansetron 4mg . 3.  Limit use of pain relievers to no more than 2 days out of the week.  These medications include acetaminophen, ibuprofen, triptans and narcotics.  This will help reduce risk of rebound headaches. 4.  Be aware of common food triggers such as processed sweets, processed foods with nitrites (such as deli meat, hot dogs, sausages), foods with MSG, alcohol (such as wine), chocolate, certain cheeses, certain fruits (dried fruits, bananas, some citrus fruit), vinegar, diet soda. 4.  Avoid caffeine 5.  Routine exercise 6.  Proper sleep hygiene 7.  Stay adequately hydrated with water 8.  Keep a headache diary. 9.  Maintain proper stress management. 10.  Do not skip meals. 11.  Consider supplements:  Magnesium citrate 400mg  to 600mg  daily, riboflavin 400mg , Coenzyme Q 10 100mg  three times daily

## 2018-07-18 ENCOUNTER — Ambulatory Visit: Payer: Medicaid Other | Admitting: Neurology

## 2018-11-01 NOTE — Progress Notes (Deleted)
NEUROLOGY FOLLOW UP OFFICE NOTE  RONNY REICHELT [id number]  HISTORY OF PRESENT ILLNESS: Lisaann Mangel is a 28 year old woman who follows up for migraines.  UPDATE: Intensity:  *** Duration:  *** Frequency:  *** Frequency of abortive medication: *** Current NSAIDS: Naproxen 500 mg (with sumatriptan) Current analgesics:  none Current triptans: Sumatriptan 100 mg (with naproxen 500 mg) Current ergotamine:  none Current anti-emetic: Zofran 4 mg Current muscle relaxants:  none Current anti-anxiolytic:  none Current sleep aide:  none Current Antihypertensive medications:  none Current Antidepressant medications:  none Current Anticonvulsant medications:  none Current anti-CGRP:  none Current Vitamins/Herbal/Supplements:  none Current Antihistamines/Decongestants:  none Other therapy:  none Birth control/hormone:  ***  Caffeine:  Occasional soda or coffee Alcohol:  occasional Smoker:  no Diet:  Hydrates, Gatorade Exercise:  yes Depression:  no; Anxiety:  yes Other pain:  no Sleep hygiene:  good  HISTORY:  Onset: 2015, following her first pregnancy. Infrequent until this past year. Location:Varies (frontal, parietal, occipital) Quality:Undulating, pulsating, stabbing Initial intensity:severe.Shedenies new headache, thunderclap headache or severe headache that wakes herfrom sleep. Aura:Monocular vision loss/blurred vision in either eye Prodrome:Dull headache for 3 days Postdrome:no Associated symptoms: Nausea, photophobia, phonophobia, sometimes vomiting She denies associated unilateral numbness or weakness. Initial Duration:1 to 2 days Initial Frequency:4 times a month Initial Frequency of abortive medication:as needed Triggers: Unknown Relieving factors:Laying down in dark Activity:aggravates  Past NSAIDS:Ibuprofen, naproxen Past analgesics:Excedrin, Tylenol, Extra-strength Tylenol Past abortive triptans:no Past muscle  relaxants:no Past anti-emetic:no Past antihypertensive medications:no Past antidepressant medications:no Past anticonvulsant medications:no Past vitamins/Herbal/Supplements:no Past antihistamines/decongestants:no Other past therapies:no  Family history of headache:Mother had migraines  PAST MEDICAL HISTORY: Past Medical History:  Diagnosis Date  . ADHD (attention deficit hyperactivity disorder)    since childhood  . Asthma    inhaler last used prior to pregnancy  . BV (bacterial vaginosis)   . Chlamydia   . GERD (gastroesophageal reflux disease)   . Gonorrhea   . Hyperemesis    G3    MEDICATIONS: Current Outpatient Medications on File Prior to Visit  Medication Sig Dispense Refill  . albuterol (PROVENTIL HFA;VENTOLIN HFA) 108 (90 BASE) MCG/ACT inhaler Inhale 1-2 puffs into the lungs every 6 (six) hours as needed for wheezing or shortness of breath.    . EPINEPHrine 0.3 mg/0.3 mL IJ SOAJ injection Inject 0.3 mg into the muscle once.    Marland Kitchen ibuprofen (ADVIL,MOTRIN) 600 MG tablet Take 1 tablet (600 mg total) by mouth every 6 (six) hours. 30 tablet 0  . naproxen (NAPROSYN) 500 MG tablet Take 1 tablet (500 mg total) by mouth every 12 (twelve) hours as needed. 16 tablet 2  . omeprazole (PRILOSEC) 20 MG capsule Take 1 capsule (20 mg total) by mouth 2 (two) times daily. (Patient not taking: Reported on 02/10/2018) 60 capsule 1  . ondansetron (ZOFRAN) 4 MG tablet Take 1 tablet (4 mg total) by mouth every 8 (eight) hours as needed for nausea or vomiting. 20 tablet 2  . oxyCODONE 10 MG TABS Take 1 tablet (10 mg total) by mouth every 4 (four) hours as needed (pain scale > 7). (Patient not taking: Reported on 02/10/2018) 30 tablet 0  . polyethylene glycol powder (GLYCOLAX/MIRALAX) powder Take 17 g by mouth daily as needed for mild constipation. 255 g 0  . Prenatal Multivit-Min-Fe-FA (PRENATAL VITAMINS) 0.8 MG tablet Take 1 tablet by mouth daily. (Patient not taking: Reported on  02/10/2018) 30 tablet 12  . SUMAtriptan (IMITREX) 100 MG tablet Take 1 tablet  earliest onset of migraine.  May repeat once in 2 hours if headache persists or recurs.  Do not exceed 2 tablets in 24 hours 10 tablet 2   No current facility-administered medications on file prior to visit.     ALLERGIES: Allergies  Allergen Reactions  . Shellfish Allergy Anaphylaxis  . Adhesive [Tape] Swelling and Other (See Comments)    Reaction:  Bruising   . Latex Hives  . Betadine [Povidone Iodine] Rash    FAMILY HISTORY: Family History  Problem Relation Age of Onset  . Hypertension Father   . Hypertension Mother   . Kidney disease Mother   . Cervical cancer Paternal Aunt   . Congestive Heart Failure Maternal Grandmother   . Breast cancer Paternal Grandfather    ***.  SOCIAL HISTORY: Social History   Socioeconomic History  . Marital status: Single    Spouse name: Not on file  . Number of children: 2  . Years of education: Not on file  . Highest education level: Some college, no degree  Occupational History  . Occupation: [employer]  Social Needs  . Financial resource strain: Not on file  . Food insecurity:    Worry: Not on file    Inability: Not on file  . Transportation needs:    Medical: Not on file    Non-medical: Not on file  Tobacco Use  . Smoking status: Never Smoker  . Smokeless tobacco: Never Used  Substance and Sexual Activity  . Alcohol use: Yes    Comment: not while pregnant   . Drug use: No  . Sexual activity: Yes    Partners: Male    Birth control/protection: None    Comment: pregnant  Lifestyle  . Physical activity:    Days per week: Not on file    Minutes per session: Not on file  . Stress: Not on file  Relationships  . Social connections:    Talks on phone: Not on file    Gets together: Not on file    Attends religious service: Not on file    Active member of club or organization: Not on file    Attends meetings of clubs or organizations: Not on file      Relationship status: Not on file  . Intimate partner violence:    Fear of current or ex partner: Not on file    Emotionally abused: Not on file    Physically abused: Not on file    Forced sexual activity: Not on file  Other Topics Concern  . Not on file  Social History Narrative   Pt is right handed, lives with her 2 daughters in a 1 story story home. Rarely drinks caffeine. Is very active.    REVIEW OF SYSTEMS: Constitutional: No fevers, chills, or sweats, no generalized fatigue, change in appetite Eyes: No visual changes, double vision, eye pain Ear, nose and throat: No hearing loss, ear pain, nasal congestion, sore throat Cardiovascular: No chest pain, palpitations Respiratory:  No shortness of breath at rest or with exertion, wheezes GastrointestinaI: No nausea, vomiting, diarrhea, abdominal pain, fecal incontinence Genitourinary:  No dysuria, urinary retention or frequency Musculoskeletal:  No neck pain, back pain Integumentary: No rash, pruritus, skin lesions Neurological: as above Psychiatric: No depression, insomnia, anxiety Endocrine: No palpitations, fatigue, diaphoresis, mood swings, change in appetite, change in weight, increased thirst Hematologic/Lymphatic:  No purpura, petechiae. Allergic/Immunologic: no itchy/runny eyes, nasal congestion, recent allergic reactions, rashes  PHYSICAL EXAM: *** General: No acute distress.  Patient appears ***-  groomed.  *** body habitus. Head:  Normocephalic/atraumatic Eyes:  Fundi examined but not visualized Neck: supple, no paraspinal tenderness, full range of motion Heart:  Regular rate and rhythm Lungs:  Clear to auscultation bilaterally Back: No paraspinal tenderness Neurological Exam: alert and oriented to person, place, and time. Attention span and concentration intact, recent and remote memory intact, fund of knowledge intact.  Speech fluent and not dysarthric, language intact.  CN II-XII intact. Bulk and tone normal,  muscle strength 5/5 throughout.  Sensation to light touch, temperature and vibration intact.  Deep tendon reflexes 2+ throughout, toes downgoing.  Finger to nose and heel to shin testing intact.  Gait normal, Romberg negative.  IMPRESSION: ***  PLAN: ***  Shon Millet, DO  CC: ***

## 2018-11-03 ENCOUNTER — Ambulatory Visit: Payer: Medicaid Other | Admitting: Neurology

## 2019-09-18 ENCOUNTER — Ambulatory Visit
Admission: EM | Admit: 2019-09-18 | Discharge: 2019-09-18 | Disposition: A | Discharge status: Home / Self Care | Payer: Medicaid Other | Attending: Emergency Medicine | Admitting: Emergency Medicine

## 2019-09-18 ENCOUNTER — Other Ambulatory Visit: Payer: Self-pay

## 2019-09-18 ENCOUNTER — Encounter: Payer: Self-pay | Admitting: Emergency Medicine

## 2019-09-18 DIAGNOSIS — S61012A Laceration without foreign body of left thumb without damage to nail, initial encounter: Secondary | ICD-10-CM

## 2019-09-18 DIAGNOSIS — W260XXA Contact with knife, initial encounter: Secondary | ICD-10-CM | POA: Diagnosis not present

## 2019-09-18 DIAGNOSIS — Y93G3 Activity, cooking and baking: Secondary | ICD-10-CM | POA: Diagnosis not present

## 2019-09-18 NOTE — ED Notes (Signed)
Patient able to ambulate independently  

## 2019-09-18 NOTE — Discharge Instructions (Signed)
Please follow wound care instructions provided in discharge paperwork. Keep hands clean, dry. Cold therapy (ice packs) can be used to help swelling both after injury and after prolonged use of areas of chronic pain/aches.  For pain: recommend 350 mg-1000 mg of Tylenol (acetaminophen) and/or 200 mg - 800 mg of Advil (ibuprofen, Motrin) every 8 hours as needed.  May alternate between the two throughout the day as they are generally safe to take together.  DO NOT exceed more than 3000 mg of Tylenol or 3200 mg of ibuprofen in a 24 hour period as this could damage your stomach, kidneys, liver, or increase your bleeding risk.

## 2019-09-18 NOTE — ED Triage Notes (Signed)
Pt presents to [facility] for assessment of cutting the tip of her thumb of the left hand with a knife last night cutting potatoes.  Does not know last tetanus but "think its up to date".

## 2019-09-18 NOTE — ED Provider Notes (Signed)
[name]    CSN: [id number] Arrival date & time: 09/18/19  1254      History   Chief Complaint Chief Complaint  Patient presents with  . Laceration    HPI Deanna Myers is a 28 y.o. female presenting for left thumb laceration, distal aspect.  States it happened when using a knife to cut potatoes last night.  Patient states last tetanus was about 3 years ago.  Denies prolonged bleeding, anticoagulant use.  Hemostasis achieved PTA with direct pressure.  Sensation intact.  Endorsing pain, hypersensitivity.   Past Medical History:  Diagnosis Date  . ADHD (attention deficit hyperactivity disorder)    since childhood  . Asthma    inhaler last used prior to pregnancy  . BV (bacterial vaginosis)   . Chlamydia   . GERD (gastroesophageal reflux disease)   . Gonorrhea   . Hyperemesis    G3    Patient Active Problem List   Diagnosis Date Noted  . Active labor at term 12/21/2015  . UTI (urinary tract infection) 06/13/2015  . Pyelonephritis affecting pregnancy in first trimester 06/12/2015  . Folliculitis 02/13/2013    Past Surgical History:  Procedure Laterality Date  . CESAREAN SECTION N/A 12/21/2015   Procedure: CESAREAN SECTION;  Surgeon: [name], MD;  Location: [facility];  Service: Obstetrics;  Laterality: N/A;  . NO PAST SURGERIES      OB History    Gravida  3   Para  2   Term  2   Preterm      AB  1   Living  2     SAB  1   TAB      Ectopic      Multiple  0   Live Births  2            Home Medications    Prior to Admission medications   Medication Sig Start Date End Date Taking? Authorizing Provider  SUMAtriptan (IMITREX) 100 MG tablet Take 1 tablet earliest onset of migraine.  May repeat once in 2 hours if headache persists or recurs.  Do not exceed 2 tablets in 24 hours 02/10/18  Yes Jaffe, Adam R, DO  albuterol (PROVENTIL HFA;VENTOLIN HFA) 108 (90 BASE) MCG/ACT inhaler Inhale 1-2 puffs into the lungs every 6  (six) hours as needed for wheezing or shortness of breath.    [provider]  EPINEPHrine 0.3 mg/0.3 mL IJ SOAJ injection Inject 0.3 mg into the muscle once.    [provider]  ibuprofen (ADVIL,MOTRIN) 600 MG tablet Take 1 tablet (600 mg total) by mouth every 6 (six) hours. 12/24/15   [name], MD  naproxen (NAPROSYN) 500 MG tablet Take 1 tablet (500 mg total) by mouth every 12 (twelve) hours as needed. 07/03/18   [name], DO  omeprazole (PRILOSEC) 20 MG capsule Take 1 capsule (20 mg total) by mouth 2 (two) times daily. Patient not taking: Reported on 02/10/2018 07/03/17 09/18/19  [name], MD    Family History Family History  Problem Relation Age of Onset  . Hypertension Father   . Hypertension Mother   . Kidney disease Mother   . Cervical cancer Paternal Aunt   . Congestive Heart Failure Maternal Grandmother   . Breast cancer Paternal Grandfather     Social History Social History   Tobacco Use  . Smoking status: Never Smoker  . Smokeless tobacco: Never Used  Substance Use Topics  . Alcohol use: Yes  Comment: not while pregnant   . Drug use: No     Allergies   Shellfish allergy, Adhesive [tape], Latex, and Betadine [povidone iodine]   Review of Systems Review of Systems  Constitutional: Negative for fatigue and fever.  HENT: Negative for ear pain, sinus pain, sore throat and voice change.   Eyes: Negative for pain, redness and visual disturbance.  Respiratory: Negative for cough and shortness of breath.   Cardiovascular: Negative for chest pain and palpitations.  Gastrointestinal: Negative for abdominal pain, diarrhea and vomiting.  Musculoskeletal: Negative for arthralgias and myalgias.  Skin: Positive for wound. Negative for rash.  Neurological: Negative for syncope and headaches.     Physical Exam Triage Vital Signs ED Triage Vitals [09/18/19 1312]  Enc Vitals Group     BP 121/79     Pulse Rate 79     Resp 18     Temp 98  F (36.7 C)     Temp Source Temporal     SpO2 98 %     Weight      Height      Head Circumference      Peak Flow      Pain Score 5     Pain Loc      Pain Edu?      Excl. in [place]?    No data found.  Updated Vital Signs BP 121/79 (BP Location: Left Arm)   Pulse 79   Temp 98 F (36.7 C) (Temporal)   Resp 18   SpO2 98%   Visual Acuity Right Eye Distance:   Left Eye Distance:   Bilateral Distance:    Right Eye Near:   Left Eye Near:    Bilateral Near:     Physical Exam Constitutional:      General: She is not in acute distress. HENT:     Head: Normocephalic and atraumatic.  Eyes:     General: No scleral icterus.    Pupils: Pupils are equal, round, and reactive to light.  Cardiovascular:     Rate and Rhythm: Normal rate.  Pulmonary:     Effort: Pulmonary effort is normal.  Musculoskeletal:        General: No swelling. Normal range of motion.  Skin:    General: Skin is warm.     Coloration: Skin is not jaundiced or pale.     Comments: 0.5 cm superficial avulsion laceration over distal aspect of left thumb sparing nailbed.  TTP without surrounding erythema, warmth, discharge.  Neurological:     Mental Status: She is alert and oriented to person, place, and time.     Sensory: No sensory deficit.      UC Treatments / Results  Labs (all labs ordered are listed, but only abnormal results are displayed) Labs Reviewed - No data to display  EKG   Radiology No results found.  Procedures Laceration Repair  Date/Time: 09/18/2019 1:46 PM Performed by: Quincy Sheehan, PA-C Authorized by: Quincy Sheehan, PA-C   Consent:    Consent obtained:  Verbal   Consent given by:  Patient   Risks discussed:  Infection, pain and need for additional repair   Alternatives discussed:  No treatment and observation Universal protocol:    Patient identity confirmed:  Verbally with patient Anesthesia (see MAR for exact dosages):    Anesthesia method:   None Laceration details:    Location:  Finger   Finger location:  L thumb   Length (cm):  0.5   Depth (mm):  2 Repair type:    Repair type:  Simple Pre-procedure details:    Preparation:  Patient was prepped and draped in usual sterile fashion Exploration:    Hemostasis achieved with:  Direct pressure   Wound exploration: wound explored through full range of motion     Wound extent: no areolar tissue violation noted, no fascia violation noted, no foreign bodies/material noted and no nerve damage noted     Contaminated: no   Treatment:    Area cleansed with:  Soap and water   Amount of cleaning:  Standard   Irrigation solution:  Tap water   Irrigation volume:  1 min   Irrigation method:  Pressure wash and tap Skin repair:    Repair method:  Tissue adhesive Approximation:    Approximation:  Close Post-procedure details:    Dressing:  Non-adherent dressing   Patient tolerance of procedure:  Tolerated well, no immediate complications   (including critical care time)  Medications Ordered in UC Medications - No data to display  Initial Impression / Assessment and Plan / UC Course  I have reviewed the triage vital signs and the nursing notes.  Pertinent labs & imaging results that were available during my care of the patient were reviewed by me and considered in my medical decision making (see chart for details).     Dermabond applied in office which patient tolerated well.  Will treat supportively as outlined below.  Return precautions discussed, patient verbalized understanding and is agreeable to plan. Final Clinical Impressions(s) / UC Diagnoses   Final diagnoses:  Laceration of left thumb without foreign body without damage to nail, initial encounter     Discharge Instructions     Please follow wound care instructions provided in discharge paperwork. Keep hands clean, dry. Cold therapy (ice packs) can be used to help swelling both after injury and after prolonged  use of areas of chronic pain/aches.  For pain: recommend 350 mg-1000 mg of Tylenol (acetaminophen) and/or 200 mg - 800 mg of Advil (ibuprofen, Motrin) every 8 hours as needed.  May alternate between the two throughout the day as they are generally safe to take together.  DO NOT exceed more than 3000 mg of Tylenol or 3200 mg of ibuprofen in a 24 hour period as this could damage your stomach, kidneys, liver, or increase your bleeding risk.    ED Prescriptions    None     PDMP not reviewed this encounter.   Hall-Potvin, [place], [place] JerseyPA-C 09/18/19 1348

## 2019-11-21 LAB — HM PAP SMEAR

## 2019-11-21 LAB — RESULTS CONSOLE HPV: CHL HPV: NEGATIVE

## 2020-01-27 ENCOUNTER — Other Ambulatory Visit: Payer: Self-pay

## 2020-01-27 ENCOUNTER — Ambulatory Visit
Admission: EM | Admit: 2020-01-27 | Discharge: 2020-01-27 | Disposition: A | Discharge status: Home / Self Care | Payer: Medicaid Other | Attending: Emergency Medicine | Admitting: Emergency Medicine

## 2020-01-27 ENCOUNTER — Encounter: Payer: Self-pay | Admitting: Emergency Medicine

## 2020-01-27 ENCOUNTER — Ambulatory Visit (INDEPENDENT_AMBULATORY_CARE_PROVIDER_SITE_OTHER): Payer: Medicaid Other

## 2020-01-27 DIAGNOSIS — W25XXXA Contact with sharp glass, initial encounter: Secondary | ICD-10-CM | POA: Diagnosis not present

## 2020-01-27 DIAGNOSIS — S91342A Puncture wound with foreign body, left foot, initial encounter: Secondary | ICD-10-CM

## 2020-01-27 DIAGNOSIS — S91312A Laceration without foreign body, left foot, initial encounter: Secondary | ICD-10-CM | POA: Diagnosis not present

## 2020-01-27 DIAGNOSIS — S90852A Superficial foreign body, left foot, initial encounter: Secondary | ICD-10-CM

## 2020-01-27 MED ORDER — TETANUS-DIPHTH-ACELL PERTUSSIS 5-2.5-18.5 LF-MCG/0.5 IM SUSP: 0.5000 mL | Freq: Once | INTRAMUSCULAR | Status: DC

## 2020-01-27 NOTE — Discharge Instructions (Addendum)
Ice, elevate, take Tylenol ibuprofen as needed for pain. Return for worsening pain, swelling, difficulty walking, fever.

## 2020-01-27 NOTE — ED Triage Notes (Signed)
Left foot injury, seen by provider prior to this nurse

## 2020-01-27 NOTE — ED Notes (Signed)
xray

## 2020-01-27 NOTE — ED Provider Notes (Signed)
[name]    CSN: [id number] Arrival date & time: 01/27/20  1419      History   Chief Complaint Chief Complaint  Patient presents with  . Foot Pain    HPI Deanna Myers is a 29 y.o. female presenting for left foot laceration.  States she stepped on glass last night.  Unknown last tetanus.  Endorsing pain.  No swelling, redness.  Is able to bear weight, though painful. Unsure if all was removed.   Past Medical History:  Diagnosis Date  . ADHD (attention deficit hyperactivity disorder)    since childhood  . Asthma    inhaler last used prior to pregnancy  . BV (bacterial vaginosis)   . Chlamydia   . GERD (gastroesophageal reflux disease)   . Gonorrhea   . Hyperemesis    G3    Patient Active Problem List   Diagnosis Date Noted  . Active labor at term 12/21/2015  . UTI (urinary tract infection) 06/13/2015  . Pyelonephritis affecting pregnancy in first trimester 06/12/2015  . Folliculitis 02/13/2013    Past Surgical History:  Procedure Laterality Date  . CESAREAN SECTION N/A 12/21/2015   Procedure: CESAREAN SECTION;  Surgeon: Deanna Cote, MD;  Location: [facility];  Service: Obstetrics;  Laterality: N/A;  . NO PAST SURGERIES      OB History    Gravida  3   Para  2   Term  2   Preterm      AB  1   Living  2     SAB  1   TAB      Ectopic      Multiple  0   Live Births  2            Home Medications    Prior to Admission medications   Medication Sig Start Date End Date Taking? Authorizing Provider  albuterol (PROVENTIL HFA;VENTOLIN HFA) 108 (90 BASE) MCG/ACT inhaler Inhale 1-2 puffs into the lungs every 6 (six) hours as needed for wheezing or shortness of breath.    [provider]  EPINEPHrine 0.3 mg/0.3 mL IJ SOAJ injection Inject 0.3 mg into the muscle once.    [provider]  ibuprofen (ADVIL,MOTRIN) 600 MG tablet Take 1 tablet (600 mg total) by mouth every 6 (six) hours. 12/24/15   Deanna Cote,  MD  naproxen (NAPROSYN) 500 MG tablet Take 1 tablet (500 mg total) by mouth every 12 (twelve) hours as needed. 07/03/18   Deanna Dallas, DO  SUMAtriptan (IMITREX) 100 MG tablet Take 1 tablet earliest onset of migraine.  May repeat once in 2 hours if headache persists or recurs.  Do not exceed 2 tablets in 24 hours 02/10/18   Deanna Dallas, DO  omeprazole (PRILOSEC) 20 MG capsule Take 1 capsule (20 mg total) by mouth 2 (two) times daily. Patient not taking: Reported on 02/10/2018 07/03/17 09/18/19  Deanna Porter, MD    Family History Family History  Problem Relation Age of Onset  . Hypertension Father   . Hypertension Mother   . Kidney disease Mother   . Cervical cancer Paternal Aunt   . Congestive Heart Failure Maternal Grandmother   . Breast cancer Paternal Grandfather     Social History Social History   Tobacco Use  . Smoking status: Never Smoker  . Smokeless tobacco: Never Used  Substance Use Topics  . Alcohol use: Yes    Comment: not while pregnant   . Drug use: No  Allergies   Shellfish allergy, Adhesive [tape], Latex, and Betadine [povidone iodine]   Review of Systems As per HPI   Physical Exam Triage Vital Signs ED Triage Vitals  Enc Vitals Group     BP      Pulse      Resp      Temp      Temp src      SpO2      Weight      Height      Head Circumference      Peak Flow      Pain Score      Pain Loc      Pain Edu?      Excl. in [place]?    No data found.  Updated Vital Signs BP 109/70 (BP Location: Right Arm) Comment (BP Location): large  Pulse 91   Temp 98 F (36.7 C) (Oral)   Resp 18   LMP 01/03/2020   SpO2 97%   Visual Acuity Right Eye Distance:   Left Eye Distance:   Bilateral Distance:    Right Eye Near:   Left Eye Near:    Bilateral Near:     Physical Exam Constitutional:      General: She is not in acute distress. HENT:     Head: Normocephalic and atraumatic.  Eyes:     General: No scleral icterus.    Pupils: Pupils are equal,  round, and reactive to light.  Cardiovascular:     Rate and Rhythm: Normal rate.  Pulmonary:     Effort: Pulmonary effort is normal.  Musculoskeletal:        General: Tenderness present. No swelling. Normal range of motion.  Skin:    Coloration: Skin is not jaundiced or pale.     Comments: Small punctate wound to plantar aspect of foot, posterolateral, that appears to be here in the wound well.  No foreign body, active discharge or bleeding.  Neurological:     Mental Status: She is alert and oriented to person, place, and time.      UC Treatments / Results  Labs (all labs ordered are listed, but only abnormal results are displayed) Labs Reviewed - No data to display  EKG   Radiology DG Foot Complete Left  Result Date: 01/27/2020 CLINICAL DATA:  Evaluate for foreign body. Patient stepped on broken glass last night. Puncture is near the proximal fifth metatarsal. EXAM: LEFT FOOT - COMPLETE 3+ VIEW COMPARISON:  None. FINDINGS: No fractures. A tiny foreign body is seen at the site of the puncture. The foreign body measures between 2 and 3 mm. IMPRESSION: There is a tiny radiopaque foreign body near the puncture site. No other abnormalities. Electronically Signed   By: Dorise Bullion III M.D   On: 01/27/2020 15:13    Procedures Foreign Body Removal  Date/Time: 01/27/2020 3:15 PM Performed by: Deanna Sheehan, PA-C Authorized by: Deanna Sheehan, PA-C   Consent:    Consent obtained:  Verbal   Consent given by:  Patient   Risks discussed:  Bleeding, infection, incomplete removal, pain and worsening of condition   Alternatives discussed:  Observation and no treatment Universal protocol:    Patient identity confirmed:  Verbally with patient Location:    Location:  Foot   Foot location:  L sole   Depth:  Intradermal   Tendon involvement:  None Pre-procedure details:    Imaging:  X-ray   Neurovascular status: intact     Preparation: Patient was prepped and  draped  in usual sterile fashion   Anesthesia (see MAR for exact dosages):    Anesthesia method:  None Procedure type:    Procedure complexity:  Simple Procedure details:    Scalpel size:  11   Incision length:  60mm   Localization method:  Visualized   Dissection of underlying tissues: no     Bloodless field: yes     Removal mechanism:  Forceps   Foreign bodies recovered:  1   Description:  3 mm shard of glass   Intact foreign body removal: yes   Post-procedure details:    Neurovascular status: intact     Confirmation:  No additional foreign bodies on visualization   Skin closure:  None   Dressing:  Adhesive bandage   Patient tolerance of procedure:  Tolerated well, no immediate complications   (including critical care time)  Medications Ordered in UC Medications - No data to display  Initial Impression / Assessment and Plan / UC Course  I have reviewed the triage vital signs and the nursing notes.  Pertinent labs & imaging results that were available during my care of the patient were reviewed by me and considered in my medical decision making (see chart for details).     Tetanus updated 2017.  Left foot x-ray reviewed by me: Significant for small radio-opaque object.  Foreign body successfully removed in office.  Patient tolerated this well.  Declined repeat imaging.  Will treat supportively as outlined below.  Return precautions discussed, patient verbalized understanding and is agreeable to plan. Final Clinical Impressions(s) / UC Diagnoses   Final diagnoses:  Laceration of left foot, initial encounter  Injury from broken glass, initial encounter     Discharge Instructions     Ice, elevate, take Tylenol ibuprofen as needed for pain. Return for worsening pain, swelling, difficulty walking, fever.    ED Prescriptions    None     PDMP not reviewed this encounter.   Hall[place]Potvin, [place], [place] 01/27/20 1517

## 2020-02-15 ENCOUNTER — Encounter: Payer: Self-pay | Admitting: Emergency Medicine

## 2020-02-15 ENCOUNTER — Other Ambulatory Visit: Payer: Self-pay

## 2020-02-15 ENCOUNTER — Ambulatory Visit
Admission: EM | Admit: 2020-02-15 | Discharge: 2020-02-15 | Disposition: A | Discharge status: Home / Self Care | Payer: Medicaid Other | Attending: Physician Assistant | Admitting: Physician Assistant

## 2020-02-15 DIAGNOSIS — N898 Other specified noninflammatory disorders of vagina: Secondary | ICD-10-CM | POA: Diagnosis not present

## 2020-02-15 LAB — POCT URINE PREGNANCY: Preg Test, Ur: NEGATIVE

## 2020-02-15 MED ORDER — FLUCONAZOLE 150 MG PO TABS: 150.0000 mg | ORAL_TABLET | Freq: Every day | ORAL | 0 refills | Status: DC

## 2020-02-15 NOTE — Discharge Instructions (Signed)
Urine negative for infection. Start diflucan for possible yeast infection. Cytology sent, you will be contacted with any positive results that requires further treatment. Refrain from sexual activity and alcohol use for the next 7 days.  Monitor for any worsening of symptoms, fever, abdominal pain, nausea, vomiting, to follow up for reevaluation.

## 2020-02-15 NOTE — ED Triage Notes (Signed)
Pt here for lower abd pain and vaginal discharge; pt sts abscess on left buttocks; denies drainage; pt sts started yesterday; pt sts abd pain starting x 3 days

## 2020-02-15 NOTE — ED Provider Notes (Signed)
[name]    CSN: [id number] Arrival date & time: 02/15/20  0935      History   Chief Complaint Chief Complaint  Patient presents with  . Abdominal Pain  . Abscess    HPI Deanna Myers is a 29 y.o. female.   29 year old female comes in for 2-3 day history of suprapubic pain. Has baseline vaginal discharge that has worsened the past few days. Denies odor, itching. Denies urinary symptoms such as frequency, dysuria, hematuria. Denies nausea/vomiting. Denies fever, chills, body aches. Was on depo-provera injections, last injection 06/2019. Had full cycle 12/2019, spotting for one day 01/2020. Sexually active with one female partner, no condom use. However, recently found out her partner has multiple partners.  Noticed to have pain to the left buttock yesterday, and thinks she may have an abscess.     Past Medical History:  Diagnosis Date  . ADHD (attention deficit hyperactivity disorder)    since childhood  . Asthma    inhaler last used prior to pregnancy  . BV (bacterial vaginosis)   . Chlamydia   . GERD (gastroesophageal reflux disease)   . Gonorrhea   . Hyperemesis    G3    Patient Active Problem List   Diagnosis Date Noted  . Active labor at term 12/21/2015  . UTI (urinary tract infection) 06/13/2015  . Pyelonephritis affecting pregnancy in first trimester 06/12/2015  . Folliculitis 02/13/2013    Past Surgical History:  Procedure Laterality Date  . CESAREAN SECTION N/A 12/21/2015   Procedure: CESAREAN SECTION;  Surgeon: [name], MD;  Location: [facility];  Service: Obstetrics;  Laterality: N/A;  . NO PAST SURGERIES      OB History    Gravida  3   Para  2   Term  2   Preterm      AB  1   Living  2     SAB  1   TAB      Ectopic      Multiple  0   Live Births  2            Home Medications    Prior to Admission medications   Medication Sig Start Date End Date Taking? Authorizing Provider  albuterol (PROVENTIL  HFA;VENTOLIN HFA) 108 (90 BASE) MCG/ACT inhaler Inhale 1-2 puffs into the lungs every 6 (six) hours as needed for wheezing or shortness of breath.    [provider]  EPINEPHrine 0.3 mg/0.3 mL IJ SOAJ injection Inject 0.3 mg into the muscle once.    [provider]  fluconazole (DIFLUCAN) 150 MG tablet Take 1 tablet (150 mg total) by mouth daily. Take second dose 72 hours later if symptoms still persists. 02/15/20   Cathie Hoops, Amy V, PA-C  ibuprofen (ADVIL,MOTRIN) 600 MG tablet Take 1 tablet (600 mg total) by mouth every 6 (six) hours. 12/24/15   [name], MD  naproxen (NAPROSYN) 500 MG tablet Take 1 tablet (500 mg total) by mouth every 12 (twelve) hours as needed. 07/03/18   [name], DO  SUMAtriptan (IMITREX) 100 MG tablet Take 1 tablet earliest onset of migraine.  May repeat once in 2 hours if headache persists or recurs.  Do not exceed 2 tablets in 24 hours 02/10/18   [name], DO  omeprazole (PRILOSEC) 20 MG capsule Take 1 capsule (20 mg total) by mouth 2 (two) times daily. Patient not taking: Reported on 02/10/2018 07/03/17 09/18/19  [name], MD  Family History Family History  Problem Relation Age of Onset  . Hypertension Father   . Hypertension Mother   . Kidney disease Mother   . Cervical cancer Paternal Aunt   . Congestive Heart Failure Maternal Grandmother   . Breast cancer Paternal Grandfather     Social History Social History   Tobacco Use  . Smoking status: Never Smoker  . Smokeless tobacco: Never Used  Substance Use Topics  . Alcohol use: Yes    Comment: not while pregnant   . Drug use: No     Allergies   Shellfish allergy, Adhesive [tape], Latex, and Betadine [povidone iodine]   Review of Systems Review of Systems  Reason unable to perform ROS: See HPI as above.     Physical Exam Triage Vital Signs ED Triage Vitals [02/15/20 0945]  Enc Vitals Group     BP 127/80     Pulse Rate 79     Resp 18     Temp 98.7 F (37.1 C)      Temp Source Oral     SpO2 98 %     Weight      Height      Head Circumference      Peak Flow      Pain Score 7     Pain Loc      Pain Edu?      Excl. in [place]?    No data found.  Updated Vital Signs BP 127/80 (BP Location: Left Arm)   Pulse 79   Temp 98.7 F (37.1 C) (Oral)   Resp 18   SpO2 98%   Physical Exam Exam conducted with a chaperone present.  Constitutional:      General: She is not in acute distress.    Appearance: She is well-developed. She is not ill-appearing, toxic-appearing or diaphoretic.  HENT:     Head: Normocephalic and atraumatic.  Eyes:     Conjunctiva/sclera: Conjunctivae normal.     Pupils: Pupils are equal, round, and reactive to light.  Cardiovascular:     Rate and Rhythm: Normal rate and regular rhythm.  Pulmonary:     Effort: Pulmonary effort is normal. No respiratory distress.     Comments: LCTAB Abdominal:     General: Bowel sounds are normal.     Palpations: Abdomen is soft.     Tenderness: There is no abdominal tenderness. There is no right CVA tenderness, left CVA tenderness, guarding or rebound.  Genitourinary:    Labia:        Right: No rash or tenderness.        Left: No rash or tenderness.      Cervix: No cervical motion tenderness.     Uterus: Normal.      Adnexa:        Right: No tenderness.         Left: No tenderness.       Comments: White clumpy discharge to the vaginal opening/canal.  Musculoskeletal:     Cervical back: Normal range of motion and neck supple.  Skin:    General: Skin is warm and dry.     Comments: Single folliculitis to the left buttock without surrounding erythema. No significant swelling, induration. Mild tenderness to palpation.   Neurological:     Mental Status: She is alert and oriented to person, place, and time.  Psychiatric:        Behavior: Behavior normal.        Judgment: Judgment normal.  UC Treatments / Results  Labs (all labs ordered are listed, but only abnormal results are  displayed) Labs Reviewed  POCT URINE PREGNANCY  CERVICOVAGINAL ANCILLARY ONLY    EKG   Radiology No results found.  Procedures Procedures (including critical care time)  Medications Ordered in UC Medications - No data to display  Initial Impression / Assessment and Plan / UC Course  I have reviewed the triage vital signs and the nursing notes.  Pertinent labs & imaging results that were available during my care of the patient were reviewed by me and considered in my medical decision making (see chart for details).    No tenderness to palpation of the abdomen, pelvic exam without tenderness, low suspicion for PID. White clumpy discharge, will cover for yeast. Cytology sent. Folliculitis to the buttock without surrounding cellulitis. No fluctuance. Will have patient do warm compress for now. Return precautions given.   Final Clinical Impressions(s) / UC Diagnoses   Final diagnoses:  Vaginal discharge   ED Prescriptions    Medication Sig Dispense Auth. Provider   fluconazole (DIFLUCAN) 150 MG tablet Take 1 tablet (150 mg total) by mouth daily. Take second dose 72 hours later if symptoms still persists. 2 tablet Ok Edwards, PA-C     PDMP not reviewed this encounter.   Ok Edwards, PA-C 02/15/20 1133

## 2020-02-18 LAB — CERVICOVAGINAL ANCILLARY ONLY
Chlamydia: NEGATIVE
Comment: NEGATIVE
Comment: NEGATIVE
Comment: NORMAL
Neisseria Gonorrhea: NEGATIVE
Trichomonas: NEGATIVE

## 2020-02-29 ENCOUNTER — Encounter: Payer: Self-pay | Admitting: Emergency Medicine

## 2020-02-29 ENCOUNTER — Other Ambulatory Visit: Payer: Self-pay

## 2020-02-29 ENCOUNTER — Ambulatory Visit
Admission: EM | Admit: 2020-02-29 | Discharge: 2020-02-29 | Disposition: A | Discharge status: Home / Self Care | Payer: Medicaid Other

## 2020-02-29 DIAGNOSIS — T192XXA Foreign body in vulva and vagina, initial encounter: Secondary | ICD-10-CM | POA: Diagnosis not present

## 2020-02-29 NOTE — ED Provider Notes (Signed)
[name]    CSN: [id number] Arrival date & time: 02/29/20  1915      History   Chief Complaint Chief Complaint  Patient presents with  . Foreign Body in Vagina    HPI Deanna Myers is a 29 y.o. female.   29 year old female comes in for possible foreign body to the vagina. States may have condom from this morning. Denies irritation, vaginal discharge, abdominal pain.      Past Medical History:  Diagnosis Date  . ADHD (attention deficit hyperactivity disorder)    since childhood  . Asthma    inhaler last used prior to pregnancy  . BV (bacterial vaginosis)   . Chlamydia   . GERD (gastroesophageal reflux disease)   . Gonorrhea   . Hyperemesis    G3    Patient Active Problem List   Diagnosis Date Noted  . Active labor at term 12/21/2015  . UTI (urinary tract infection) 06/13/2015  . Pyelonephritis affecting pregnancy in first trimester 06/12/2015  . Folliculitis 26/83/4196    Past Surgical History:  Procedure Laterality Date  . CESAREAN SECTION N/A 12/21/2015   Procedure: CESAREAN SECTION;  Surgeon: Paula Compton, MD;  Location: [facility];  Service: Obstetrics;  Laterality: N/A;  . NO PAST SURGERIES      OB History    Gravida  3   Para  2   Term  2   Preterm      AB  1   Living  2     SAB  1   TAB      Ectopic      Multiple  0   Live Births  2            Home Medications    Prior to Admission medications   Medication Sig Start Date End Date Taking? Authorizing Provider  albuterol (PROVENTIL HFA;VENTOLIN HFA) 108 (90 BASE) MCG/ACT inhaler Inhale 1-2 puffs into the lungs every 6 (six) hours as needed for wheezing or shortness of breath.    [provider]  EPINEPHrine 0.3 mg/0.3 mL IJ SOAJ injection Inject 0.3 mg into the muscle once.    [provider]  fluconazole (DIFLUCAN) 150 MG tablet Take 1 tablet (150 mg total) by mouth daily. Take second dose 72 hours later if symptoms still  persists. Patient not taking: Reported on 02/29/2020 02/15/20   Ok Edwards, PA-C  ibuprofen (ADVIL,MOTRIN) 600 MG tablet Take 1 tablet (600 mg total) by mouth every 6 (six) hours. 12/24/15   Paula Compton, MD  naproxen (NAPROSYN) 500 MG tablet Take 1 tablet (500 mg total) by mouth every 12 (twelve) hours as needed. 07/03/18   Pieter Partridge, DO  SUMAtriptan (IMITREX) 100 MG tablet Take 1 tablet earliest onset of migraine.  May repeat once in 2 hours if headache persists or recurs.  Do not exceed 2 tablets in 24 hours 02/10/18   Pieter Partridge, DO  omeprazole (PRILOSEC) 20 MG capsule Take 1 capsule (20 mg total) by mouth 2 (two) times daily. Patient not taking: Reported on 02/10/2018 07/03/17 09/18/19  Tanna Furry, MD    Family History Family History  Problem Relation Age of Onset  . Hypertension Father   . Hypertension Mother   . Kidney disease Mother   . Cervical cancer Paternal Aunt   . Congestive Heart Failure Maternal Grandmother   . Breast cancer Paternal Grandfather     Social History Social History   Tobacco Use  .  Smoking status: Never Smoker  . Smokeless tobacco: Never Used  Substance Use Topics  . Alcohol use: Yes    Comment: not while pregnant   . Drug use: No     Allergies   Shellfish allergy, Adhesive [tape], Latex, and Betadine [povidone iodine]   Review of Systems Review of Systems  Reason unable to perform ROS: See HPI as above.     Physical Exam Triage Vital Signs ED Triage Vitals  Enc Vitals Group     BP 02/29/20 1932 105/72     Pulse Rate 02/29/20 1932 85     Resp 02/29/20 1932 18     Temp 02/29/20 1932 98.3 F (36.8 C)     Temp Source 02/29/20 1932 Oral     SpO2 02/29/20 1932 96 %     Weight --      Height --      Head Circumference --      Peak Flow --      Pain Score 02/29/20 1933 0     Pain Loc --      Pain Edu? --      Excl. in [place]? --    No data found.  Updated Vital Signs BP 105/72 (BP Location: Right Arm)   Pulse 85   Temp 98.3  F (36.8 C) (Oral)   Resp 18   SpO2 96%   Physical Exam Exam conducted with a chaperone present.  Constitutional:      General: She is not in acute distress.    Appearance: Normal appearance. She is well-developed. She is not toxic-appearing or diaphoretic.  HENT:     Head: Normocephalic and atraumatic.  Eyes:     Conjunctiva/sclera: Conjunctivae normal.     Pupils: Pupils are equal, round, and reactive to light.  Pulmonary:     Effort: Pulmonary effort is normal. No respiratory distress.     Comments: Speaking in full sentences without difficulty Genitourinary:    Comments: Condom to the 6 oclock region below cervix. Removed with foerster forceps. Condom appears to be intact. No other foreign body seen. No vaginal discharge. Musculoskeletal:     Cervical back: Normal range of motion and neck supple.  Skin:    General: Skin is warm and dry.  Neurological:     Mental Status: She is alert and oriented to person, place, and time.    UC Treatments / Results  Labs (all labs ordered are listed, but only abnormal results are displayed) Labs Reviewed - No data to display  EKG   Radiology No results found.  Procedures Procedures (including critical care time)  Medications Ordered in UC Medications - No data to display  Initial Impression / Assessment and Plan / UC Course  I have reviewed the triage vital signs and the nursing notes.  Pertinent labs & imaging results that were available during my care of the patient were reviewed by me and considered in my medical decision making (see chart for details).    Foreign body: condom removed. No further foreign body noted. No vaginal discharge. Recheck as needed.  Final Clinical Impressions(s) / UC Diagnoses   Final diagnoses:  Foreign body in vagina, initial encounter    ED Prescriptions    None     PDMP not reviewed this encounter.   Belinda Fisher, PA-C 02/29/20 2032

## 2020-02-29 NOTE — Discharge Instructions (Signed)
Condom removed. No discharge seen. Monitor for any irritation.

## 2020-02-29 NOTE — ED Triage Notes (Signed)
Pt here for condom stuck in her vagina from sexual intercourse earlier today

## 2020-03-14 ENCOUNTER — Encounter: Payer: Self-pay | Admitting: Physician Assistant

## 2020-03-14 ENCOUNTER — Ambulatory Visit
Admission: EM | Admit: 2020-03-14 | Discharge: 2020-03-14 | Disposition: A | Discharge status: Home / Self Care | Payer: Medicaid Other | Attending: Physician Assistant | Admitting: Physician Assistant

## 2020-03-14 ENCOUNTER — Other Ambulatory Visit: Payer: Self-pay

## 2020-03-14 DIAGNOSIS — N760 Acute vaginitis: Secondary | ICD-10-CM | POA: Insufficient documentation

## 2020-03-14 LAB — POCT URINE PREGNANCY: Preg Test, Ur: NEGATIVE

## 2020-03-14 MED ORDER — FLUCONAZOLE 150 MG PO TABS
150.0000 mg | ORAL_TABLET | Freq: Every day | ORAL | 0 refills | Status: DC
Start: 2020-03-14 — End: 2020-05-05

## 2020-03-14 NOTE — ED Provider Notes (Signed)
[name]    CSN: [id number] Arrival date & time: 03/14/20  1250      History   Chief Complaint Chief Complaint  Patient presents with   Vaginal Discharge    HPI Deanna Myers is a 29 y.o. female.   29 year old female comes in for 2 day history of clumpy white discharge. Denies odor, itching. Denies abdominal symptoms, urinary symptoms, fever. Had condom removed in office 02/29/2020, h/o allergy to latex. No changes to hygiene product. Had some spotting this month, had full cycle early 02/2020.      Past Medical History:  Diagnosis Date   ADHD (attention deficit hyperactivity disorder)    since childhood   Asthma    inhaler last used prior to pregnancy   BV (bacterial vaginosis)    Chlamydia    GERD (gastroesophageal reflux disease)    Gonorrhea    Hyperemesis    G3    Patient Active Problem List   Diagnosis Date Noted   Active labor at term 12/21/2015   UTI (urinary tract infection) 06/13/2015   Pyelonephritis affecting pregnancy in first trimester 06/12/2015   Folliculitis 02/13/2013    Past Surgical History:  Procedure Laterality Date   CESAREAN SECTION N/A 12/21/2015   Procedure: CESAREAN SECTION;  Surgeon: [name], MD;  Location: [facility];  Service: Obstetrics;  Laterality: N/A;   NO PAST SURGERIES      OB History    Gravida  3   Para  2   Term  2   Preterm      AB  1   Living  2     SAB  1   TAB      Ectopic      Multiple  0   Live Births  2            Home Medications    Prior to Admission medications   Medication Sig Start Date End Date Taking? Authorizing Provider  albuterol (PROVENTIL HFA;VENTOLIN HFA) 108 (90 BASE) MCG/ACT inhaler Inhale 1-2 puffs into the lungs every 6 (six) hours as needed for wheezing or shortness of breath.    [provider]  EPINEPHrine 0.3 mg/0.3 mL IJ SOAJ injection Inject 0.3 mg into the muscle once.    [provider]  fluconazole  (DIFLUCAN) 150 MG tablet Take 1 tablet (150 mg total) by mouth daily. Take second dose 72 hours later if symptoms still persists. 03/14/20   Cathie Hoops, Francis Yardley V, PA-C  ibuprofen (ADVIL,MOTRIN) 600 MG tablet Take 1 tablet (600 mg total) by mouth every 6 (six) hours. 12/24/15   [name], MD  naproxen (NAPROSYN) 500 MG tablet Take 1 tablet (500 mg total) by mouth every 12 (twelve) hours as needed. 07/03/18   [name], DO  SUMAtriptan (IMITREX) 100 MG tablet Take 1 tablet earliest onset of migraine.  May repeat once in 2 hours if headache persists or recurs.  Do not exceed 2 tablets in 24 hours 02/10/18   [name], DO  omeprazole (PRILOSEC) 20 MG capsule Take 1 capsule (20 mg total) by mouth 2 (two) times daily. Patient not taking: Reported on 02/10/2018 07/03/17 09/18/19  [name], MD    Family History Family History  Problem Relation Age of Onset   Hypertension Father    Hypertension Mother    Kidney disease Mother    Cervical cancer Paternal Aunt    Congestive Heart Failure Maternal Grandmother    Breast cancer Paternal Grandfather  Social History Social History   Tobacco Use   Smoking status: Never Smoker   Smokeless tobacco: Never Used  Substance Use Topics   Alcohol use: Yes    Comment: not while pregnant    Drug use: No     Allergies   Shellfish allergy, Adhesive [tape], Latex, and Betadine [povidone iodine]   Review of Systems Review of Systems  Reason unable to perform ROS: See HPI as above.     Physical Exam Triage Vital Signs ED Triage Vitals [03/14/20 1256]  Enc Vitals Group     BP 133/76     Pulse Rate 81     Resp 16     Temp 98.2 F (36.8 C)     Temp src      SpO2 100 %     Weight      Height      Head Circumference      Peak Flow      Pain Score      Pain Loc      Pain Edu?      Excl. in GC?    No data found.  Updated Vital Signs BP 133/76    Pulse 81    Temp 98.2 F (36.8 C)    Resp 16    LMP  (LMP Unknown)    SpO2 100%     Visual Acuity Right Eye Distance:   Left Eye Distance:   Bilateral Distance:    Right Eye Near:   Left Eye Near:    Bilateral Near:     Physical Exam Constitutional:      General: She is not in acute distress.    Appearance: Normal appearance. She is well-developed. She is not toxic-appearing or diaphoretic.  HENT:     Head: Normocephalic and atraumatic.  Eyes:     Conjunctiva/sclera: Conjunctivae normal.     Pupils: Pupils are equal, round, and reactive to light.  Pulmonary:     Effort: Pulmonary effort is normal. No respiratory distress.     Comments: Speaking in full sentences without difficulty Musculoskeletal:     Cervical back: Normal range of motion and neck supple.  Skin:    General: Skin is warm and dry.  Neurological:     Mental Status: She is alert and oriented to person, place, and time.      UC Treatments / Results  Labs (all labs ordered are listed, but only abnormal results are displayed) Labs Reviewed  POCT URINE PREGNANCY - Normal  CERVICOVAGINAL ANCILLARY ONLY    EKG   Radiology No results found.  Procedures Procedures (including critical care time)  Medications Ordered in UC Medications - No data to display  Initial Impression / Assessment and Plan / UC Course  I have reviewed the triage vital signs and the nursing notes.  Pertinent labs & imaging results that were available during my care of the patient were reviewed by me and considered in my medical decision making (see chart for details).    Urine pregnancy negative. Patient was treated empirically for yeast with diflucan. Cytology sent, patient will be contacted with any positive results that require additional treatment. Patient to refrain from sexual activity for the next 7 days. Return precautions given.   Final Clinical Impressions(s) / UC Diagnoses   Final diagnoses:  Acute vaginitis    ED Prescriptions    Medication Sig Dispense Auth. Provider   fluconazole  (DIFLUCAN) 150 MG tablet Take 1 tablet (150 mg total) by mouth  daily. Take second dose 72 hours later if symptoms still persists. 2 tablet Ok Edwards, PA-C     PDMP not reviewed this encounter.   Ok Edwards, PA-C 03/14/20 1345

## 2020-03-14 NOTE — ED Triage Notes (Signed)
Pt c/o clumpy white vaginal discharge x 2 days Pt also concerned she is pregnant due to irregular menstrual cycle

## 2020-03-14 NOTE — Discharge Instructions (Signed)
You were treated empirically for yeast/ diflucan as directed. Cytology sent, you will be contacted with any positive results that requires further treatment. Refrain from sexual activity for the next 7 days. Monitor for any worsening of symptoms, fever, abdominal pain, nausea, vomiting, to follow up for reevaluation.

## 2020-03-17 LAB — CERVICOVAGINAL ANCILLARY ONLY
Chlamydia: NEGATIVE
Comment: NEGATIVE
Comment: NEGATIVE
Comment: NORMAL
Neisseria Gonorrhea: NEGATIVE
Trichomonas: NEGATIVE

## 2020-05-05 ENCOUNTER — Emergency Department (HOSPITAL_COMMUNITY)
Admission: EM | Admit: 2020-05-05 | Discharge: 2020-05-05 | Discharge status: Against Medical Advice | Payer: Medicaid Other

## 2020-05-05 ENCOUNTER — Other Ambulatory Visit: Payer: Self-pay

## 2020-05-05 ENCOUNTER — Ambulatory Visit
Admission: EM | Admit: 2020-05-05 | Discharge: 2020-05-05 | Disposition: A | Discharge status: Home / Self Care | Payer: Medicaid Other | Attending: Emergency Medicine | Admitting: Emergency Medicine

## 2020-05-05 DIAGNOSIS — S91115A Laceration without foreign body of left lesser toe(s) without damage to nail, initial encounter: Secondary | ICD-10-CM | POA: Diagnosis not present

## 2020-05-05 NOTE — ED Provider Notes (Signed)
[name]    CSN: [id number] Arrival date & time: 05/05/20  4315      History   Chief Complaint Chief Complaint  Patient presents with  . Laceration    HPI Deanna Myers is a 29 y.o. female presenting for left fifth toe laceration.  States he cut on a piece of glass around 1 AM this morning while intoxicated.  Bleeding controlled with direct pressure.  Tetanus last updated 2019.  Denies numbness, discoloration, anticoagulant use, foreign body sensation.   Past Medical History:  Diagnosis Date  . ADHD (attention deficit hyperactivity disorder)    since childhood  . Asthma    inhaler last used prior to pregnancy  . BV (bacterial vaginosis)   . Chlamydia   . GERD (gastroesophageal reflux disease)   . Gonorrhea   . Hyperemesis    G3    Patient Active Problem List   Diagnosis Date Noted  . Active labor at term 12/21/2015  . UTI (urinary tract infection) 06/13/2015  . Pyelonephritis affecting pregnancy in first trimester 06/12/2015  . Folliculitis 02/13/2013    Past Surgical History:  Procedure Laterality Date  . CESAREAN SECTION N/A 12/21/2015   Procedure: CESAREAN SECTION;  Surgeon: [name], MD;  Location: [facility];  Service: Obstetrics;  Laterality: N/A;  . NO PAST SURGERIES      OB History    Gravida  3   Para  2   Term  2   Preterm      AB  1   Living  2     SAB  1   TAB      Ectopic      Multiple  0   Live Births  2            Home Medications    Prior to Admission medications   Medication Sig Start Date End Date Taking? Authorizing Provider  albuterol (PROVENTIL HFA;VENTOLIN HFA) 108 (90 BASE) MCG/ACT inhaler Inhale 1-2 puffs into the lungs every 6 (six) hours as needed for wheezing or shortness of breath.    [provider]  EPINEPHrine 0.3 mg/0.3 mL IJ SOAJ injection Inject 0.3 mg into the muscle once.    [provider]  ibuprofen (ADVIL,MOTRIN) 600 MG tablet Take 1 tablet (600 mg total)  by mouth every 6 (six) hours. 12/24/15   [name], MD  naproxen (NAPROSYN) 500 MG tablet Take 1 tablet (500 mg total) by mouth every 12 (twelve) hours as needed. 07/03/18   [name], DO  SUMAtriptan (IMITREX) 100 MG tablet Take 1 tablet earliest onset of migraine.  May repeat once in 2 hours if headache persists or recurs.  Do not exceed 2 tablets in 24 hours 02/10/18   [name], DO  omeprazole (PRILOSEC) 20 MG capsule Take 1 capsule (20 mg total) by mouth 2 (two) times daily. Patient not taking: Reported on 02/10/2018 07/03/17 09/18/19  [name], MD    Family History Family History  Problem Relation Age of Onset  . Hypertension Father   . Hypertension Mother   . Kidney disease Mother   . Cervical cancer Paternal Aunt   . Congestive Heart Failure Maternal Grandmother   . Breast cancer Paternal Grandfather     Social History Social History   Tobacco Use  . Smoking status: Never Smoker  . Smokeless tobacco: Never Used  Substance Use Topics  . Alcohol use: Yes    Comment: social  . Drug use: No  Allergies   Shellfish allergy, Adhesive [tape], Latex, and Betadine [povidone iodine]   Review of Systems As per HPI   Physical Exam Triage Vital Signs ED Triage Vitals  Enc Vitals Group     BP 05/05/20 0949 111/78     Pulse Rate 05/05/20 0949 87     Resp 05/05/20 0949 16     Temp 05/05/20 0949 98 F (36.7 C)     Temp Source 05/05/20 0949 Oral     SpO2 05/05/20 0949 98 %     Weight --      Height --      Head Circumference --      Peak Flow --      Pain Score 05/05/20 0955 8     Pain Loc --      Pain Edu? --      Excl. in GC? --    No data found.  Updated Vital Signs BP 111/78 (BP Location: Left Arm)   Pulse 87   Temp 98 F (36.7 C) (Oral)   Resp 16   LMP 04/14/2020   SpO2 98%   Breastfeeding No   Visual Acuity Right Eye Distance:   Left Eye Distance:   Bilateral Distance:    Right Eye Near:   Left Eye Near:    Bilateral Near:      Physical Exam Constitutional:      General: She is not in acute distress. HENT:     Head: Normocephalic and atraumatic.  Eyes:     General: No scleral icterus.    Pupils: Pupils are equal, round, and reactive to light.  Cardiovascular:     Rate and Rhythm: Normal rate.  Pulmonary:     Effort: Pulmonary effort is normal.  Musculoskeletal:        General: Swelling and tenderness present. Normal range of motion.  Skin:    Capillary Refill: Capillary refill takes less than 2 seconds.     Coloration: Skin is not jaundiced or pale.     Comments: 1.5 cm partial-thickness laceration to plantar aspect of left fifth toe.  No foreign body, contamination.  NVI  Neurological:     Mental Status: She is alert and oriented to person, place, and time.      UC Treatments / Results  Labs (all labs ordered are listed, but only abnormal results are displayed) Labs Reviewed - No data to display  EKG   Radiology No results found.  Procedures Laceration Repair  Date/Time: 05/05/2020 11:01 AM Performed by: Shea Evans, PA-C Authorized by: Shea Evans, PA-C   Consent:    Consent obtained:  Verbal   Consent given by:  Patient   Risks discussed:  Infection, need for additional repair, pain, poor cosmetic result and poor wound healing   Alternatives discussed:  No treatment and delayed treatment Universal protocol:    Patient identity confirmed:  Verbally with patient Anesthesia (see MAR for exact dosages):    Anesthesia method:  Local infiltration   Local anesthetic:  Lidocaine 2% w/o epi Laceration details:    Location:  Toe   Toe location:  L little toe   Length (cm):  1.5   Depth (mm):  10 Repair type:    Repair type:  Simple Pre-procedure details:    Preparation:  Patient was prepped and draped in usual sterile fashion Exploration:    Hemostasis achieved with:  Direct pressure   Wound exploration: wound explored through full range of motion     Wound  extent: no foreign bodies/material noted, no nerve damage noted and no vascular damage noted     Contaminated: no   Treatment:    Area cleansed with:  Saline   Amount of cleaning:  Standard   Irrigation solution:  Sterile saline   Irrigation method:  Pressure wash Skin repair:    Repair method:  Sutures   Suture size:  5-0   Suture material:  Prolene   Suture technique:  Simple interrupted and horizontal mattress   Number of sutures:  3 (2 SI, 1 HM) Approximation:    Approximation:  Close Post-procedure details:    Dressing:  Bulky dressing   Patient tolerance of procedure:  Tolerated well, no immediate complications   (including critical care time)  Medications Ordered in UC Medications - No data to display  Initial Impression / Assessment and Plan / UC Course  I have reviewed the triage vital signs and the nursing notes.  Pertinent labs & imaging results that were available during my care of the patient were reviewed by me and considered in my medical decision making (see chart for details).     Wound thoroughly irrigated, sutures placed which patient tolerated well.  Will do postop shoe to help with ambulation and healing.  Return precautions discussed, pt verbalized understanding and is agreeable to plan. Final Clinical Impressions(s) / UC Diagnoses   Final diagnoses:  Laceration of lesser toe of left foot without foreign body present or damage to nail, initial encounter     Discharge Instructions     RICE: rest, ice, compression, elevation as needed for pain.   Cold therapy (ice packs) can be used to help swelling both after injury and after prolonged use of areas of chronic pain/aches.  Pain medication:  350 mg-1000 mg of Tylenol (acetaminophen) and/or 200 mg - 800 mg of Advil (ibuprofen, Motrin) every 8 hours as needed.  May alternate between the two throughout the day as they are generally safe to take together.  DO NOT exceed more than 3000 mg of Tylenol or 3200  mg of ibuprofen in a 24 hour period as this could damage your stomach, kidneys, liver, or increase your bleeding risk.    ED Prescriptions    None     PDMP not reviewed this encounter.   [place], [place], [place] 05/05/20 1103

## 2020-05-05 NOTE — ED Triage Notes (Signed)
Pt stepped on glass and lacerated her lt little toe hours ago  She asked if I was going to look at  The cut  I told her no the doctor in the back would see her.  She said that's crazy  She was told when she arrived that there was a 3 hour wait .  Now shes upset that she cannot be seen right away  Left the room and walked out  lmp last month

## 2020-05-05 NOTE — Discharge Instructions (Signed)
RICE: rest, ice, compression, elevation as needed for pain.   Cold therapy (ice packs) can be used to help swelling both after injury and after prolonged use of areas of chronic pain/aches.  Pain medication:  350 mg-1000 mg of Tylenol (acetaminophen) and/or 200 mg - 800 mg of Advil (ibuprofen, Motrin) every 8 hours as needed.  May alternate between the two throughout the day as they are generally safe to take together.  DO NOT exceed more than 3000 mg of Tylenol or 3200 mg of ibuprofen in a 24 hour period as this could damage your stomach, kidneys, liver, or increase your bleeding risk. 

## 2020-05-05 NOTE — ED Triage Notes (Addendum)
Pt states cut her lt pinky toe on a piece of glass around 1am this morning. Bleeding is controlled. Pt c/o swelling and pain.

## 2020-05-16 ENCOUNTER — Ambulatory Visit
Admission: EM | Admit: 2020-05-16 | Discharge: 2020-05-16 | Disposition: A | Discharge status: Home / Self Care | Payer: Medicaid Other | Attending: Emergency Medicine | Admitting: Emergency Medicine

## 2020-05-21 ENCOUNTER — Ambulatory Visit (HOSPITAL_COMMUNITY)
Admission: EM | Admit: 2020-05-21 | Discharge: 2020-05-21 | Disposition: A | Discharge status: Home / Self Care | Payer: Medicaid Other | Attending: Family Medicine | Admitting: Family Medicine

## 2020-05-21 ENCOUNTER — Other Ambulatory Visit: Payer: Self-pay

## 2020-05-21 ENCOUNTER — Encounter (HOSPITAL_COMMUNITY): Payer: Self-pay | Admitting: Emergency Medicine

## 2020-05-21 DIAGNOSIS — R430 Anosmia: Secondary | ICD-10-CM | POA: Insufficient documentation

## 2020-05-21 DIAGNOSIS — Z20822 Contact with and (suspected) exposure to covid-19: Secondary | ICD-10-CM | POA: Diagnosis present

## 2020-05-21 DIAGNOSIS — U071 COVID-19: Secondary | ICD-10-CM | POA: Diagnosis not present

## 2020-05-21 NOTE — Discharge Instructions (Signed)
You have been tested for COVID-19 today. °If your test returns positive, you will receive a phone call from [facility] regarding your results. °Negative test results are not called. °Both positive and negative results area always visible on MyChart. °If you do not have a MyChart account, sign up instructions are provided in your discharge papers. °Please do not hesitate to contact us should you have questions or concerns. ° °

## 2020-05-21 NOTE — ED Provider Notes (Signed)
[facility]   [id number] 05/21/20 Arrival Time: 1322  ASSESSMENT & PLAN:  1. Loss of sense of smell   2. Exposure to COVID-19 virus      COVID-19 testing sent. See letter/work note on file for self-isolation guidelines. OTC symptom care as needed.   Follow-up Information    [facility].   Specialty: Urgent Care Why: As needed. Contact information: [address] [place] [address] [phone]              Reviewed expectations re: course of current medical issues. Questions answered. Outlined signs and symptoms indicating need for more acute intervention. Understanding verbalized. After Visit Summary given.   SUBJECTIVE: History from: patient. Deanna Myers is a 29 y.o. female who requests COVID-19 testing. Known COVID-19 contact: family members. Recent travel: none. Reports: loss of sense of smell past 48 hours. Denies: fever, cough, sore throat, difficulty breathing and headache. Normal PO intake without n/v/d.    OBJECTIVE:  Vitals:   05/21/20 1637  BP: 121/71  Pulse: 91  Resp: 18  Temp: 99.1 F (37.3 C)  TempSrc: Oral  SpO2: 98%    General appearance: alert; no distress Eyes: PERRLA; EOMI; conjunctiva normal HENT: [place]; AT; nasal mucosa normal; oral mucosa normal Neck: supple  Lungs: speaks full sentences without difficulty; unlabored Extremities: no edema Skin: warm and dry Neurologic: normal gait Psychological: alert and cooperative; normal mood and affect  Labs:  Labs Reviewed  SARS CORONAVIRUS 2 (TAT 6-24 HRS)     Allergies  Allergen Reactions  . Shellfish Allergy Anaphylaxis  . Adhesive [Tape] Swelling and Other (See Comments)    Reaction:  Bruising   . Latex Hives  . Betadine [Povidone Iodine] Rash    Past Medical History:  Diagnosis Date  . ADHD (attention deficit hyperactivity disorder)    since childhood  . Asthma    inhaler last used prior to pregnancy  . BV  (bacterial vaginosis)   . Chlamydia   . GERD (gastroesophageal reflux disease)   . Gonorrhea   . Hyperemesis    G3   Social History   Socioeconomic History  . Marital status: Single    Spouse name: Not on file  . Number of children: 2  . Years of education: Not on file  . Highest education level: Some college, no degree  Occupational History  . Occupation: Naval architect  Tobacco Use  . Smoking status: Never Smoker  . Smokeless tobacco: Never Used  Substance and Sexual Activity  . Alcohol use: Yes    Comment: social  . Drug use: No  . Sexual activity: Yes    Partners: Male    Birth control/protection: None  Other Topics Concern  . Not on file  Social History Narrative   Pt is right handed, lives with her 2 daughters in a 1 story story home. Rarely drinks caffeine. Is very active.   Social Determinants of Health   Financial Resource Strain:   . Difficulty of Paying Living Expenses:   Food Insecurity:   . Worried About Programme researcher, broadcasting/film/video in the Last Year:   . Barista in the Last Year:   Transportation Needs:   . Freight forwarder (Medical):   Marland Kitchen Lack of Transportation (Non-Medical):   Physical Activity:   . Days of Exercise per Week:   . Minutes of Exercise per Session:   Stress:   . Feeling of Stress :   Social Connections:   .  Frequency of Communication with Friends and Family:   . Frequency of Social Gatherings with Friends and Family:   . Attends Religious Services:   . Active Member of Clubs or Organizations:   . Attends Banker Meetings:   Marland Kitchen Marital Status:   Intimate Partner Violence:   . Fear of Current or Ex-Partner:   . Emotionally Abused:   Marland Kitchen Physically Abused:   . Sexually Abused:    Family History  Problem Relation Age of Onset  . Hypertension Father   . Hypertension Mother   . Kidney disease Mother   . Cervical cancer Paternal Aunt   . Congestive Heart Failure Maternal Grandmother   . Breast cancer Paternal  Grandfather    Past Surgical History:  Procedure Laterality Date  . CESAREAN SECTION N/A 12/21/2015   Procedure: CESAREAN SECTION;  Surgeon: [name], MD;  Location: [facility];  Service: Obstetrics;  Laterality: N/A;  . NO PAST SURGERIES       Mardella Layman, MD 05/21/20 [phone]

## 2020-05-21 NOTE — ED Triage Notes (Signed)
Patient has loss of sense of smell that was noticed on Monday.  Patient has several covid positive family members.

## 2020-05-22 ENCOUNTER — Telehealth: Payer: Self-pay | Admitting: *Deleted

## 2020-05-22 LAB — SARS CORONAVIRUS 2 (TAT 6-24 HRS): SARS Coronavirus 2: POSITIVE — AB

## 2020-05-22 NOTE — Telephone Encounter (Signed)
Reviewed positive Covid 19 results with the patient. Discussed while staying home and away from others during the next 10 days to monitor her breathing and with any concerns call your doctor or seek treatment at the ED with a mask on. Be sure to drink lots of fluids and disinfect common household areas often during this time. Wear a mask if you must leave your designated area at any time. Reviewed end date for isolation and criteria to stop- no fever and no worsening respiratory symptoms. Afterwards continue to wear a mask in public. Your local health department may call you as a results of your positive test. Notified [facility].

## 2020-05-27 ENCOUNTER — Ambulatory Visit
Admission: EM | Admit: 2020-05-27 | Discharge: 2020-05-27 | Disposition: A | Discharge status: Home / Self Care | Payer: Medicaid Other | Attending: Emergency Medicine | Admitting: Emergency Medicine

## 2020-05-27 DIAGNOSIS — U071 COVID-19: Secondary | ICD-10-CM | POA: Diagnosis not present

## 2020-05-27 LAB — POCT URINE PREGNANCY: Preg Test, Ur: NEGATIVE

## 2020-05-27 MED ORDER — ONDANSETRON 4 MG PO TBDP
4.0000 mg | ORAL_TABLET | Freq: Once | ORAL | Status: AC
  Administered 2020-05-27: 4 mg via ORAL

## 2020-05-27 MED ORDER — FLUTICASONE PROPIONATE 50 MCG/ACT NA SUSP
1.0000 | Freq: Every day | NASAL | 0 refills | Status: DC
Start: 2020-05-27 — End: 2020-10-06

## 2020-05-27 MED ORDER — ONDANSETRON 4 MG PO TBDP
4.0000 mg | ORAL_TABLET | Freq: Three times a day (TID) | ORAL | 0 refills | Status: DC | PRN
Start: 2020-05-27 — End: 2020-10-06

## 2020-05-27 MED ORDER — CETIRIZINE HCL 10 MG PO TABS: 10.0000 mg | ORAL_TABLET | Freq: Every day | ORAL | 0 refills | Status: DC

## 2020-05-27 NOTE — ED Triage Notes (Addendum)
COVID +, has had diarrhea and vomiting. Not taking anything OTC for meds.  During triage patient states period is 1 week late

## 2020-05-27 NOTE — Discharge Instructions (Signed)
It is very important to remember that since you have tested positive for Covid you need to be staying home and quarantining to help keep others safe and healthy in the community.  If you would like further evaluation for persistent or worsening symptoms, you may schedule an E-visit or virtual (video) visit throughout the [facility] MyChart app or website.  PLEASE NOTE: If you develop severe chest pain or shortness of breath please go to the ER or call 9-1-1 for further evaluation --> DO NOT schedule electronic or virtual visits for this. Please call our office for further guidance / recommendations as needed.  For information about the Covid vaccine, please visit [email or web address] 

## 2020-05-27 NOTE — ED Provider Notes (Signed)
[name]    CSN: [id number] Arrival date & time: 05/27/20  1152      History   Chief Complaint Chief Complaint  Patient presents with  . Emesis  . Diarrhea    HPI Deanna Myers is a 29 y.o. female presenting for evaluation of nausea, diarrhea.  Really diagnosed with Covid on 8/18.  Denies cough, fever, difficulty breathing, chest pain.  Has had some vomiting without projection, bile, blood.  No severe abdominal pain.  Patient notes her period is 1 week late; has not taken pregnancy test.  Has not take anything for symptoms as "I was not told what to take ".    Past Medical History:  Diagnosis Date  . ADHD (attention deficit hyperactivity disorder)    since childhood  . Asthma    inhaler last used prior to pregnancy  . BV (bacterial vaginosis)   . Chlamydia   . GERD (gastroesophageal reflux disease)   . Gonorrhea   . Hyperemesis    G3    Patient Active Problem List   Diagnosis Date Noted  . Active labor at term 12/21/2015  . UTI (urinary tract infection) 06/13/2015  . Pyelonephritis affecting pregnancy in first trimester 06/12/2015  . Folliculitis 02/13/2013    Past Surgical History:  Procedure Laterality Date  . CESAREAN SECTION N/A 12/21/2015   Procedure: CESAREAN SECTION;  Surgeon: [name], MD;  Location: [facility];  Service: Obstetrics;  Laterality: N/A;  . NO PAST SURGERIES      OB History    Gravida  3   Para  2   Term  2   Preterm      AB  1   Living  2     SAB  1   TAB      Ectopic      Multiple  0   Live Births  2            Home Medications    Prior to Admission medications   Medication Sig Start Date End Date Taking? Authorizing Provider  cetirizine (ZYRTEC ALLERGY) 10 MG tablet Take 1 tablet (10 mg total) by mouth daily. 05/27/20   Hall-Potvin, [place], PA-C  EPINEPHrine 0.3 mg/0.3 mL IJ SOAJ injection Inject 0.3 mg into the muscle once.    [provider]  fluticasone (FLONASE) 50  MCG/ACT nasal spray Place 1 spray into both nostrils daily. 05/27/20   Hall-Potvin, [place], PA-C  ondansetron (ZOFRAN ODT) 4 MG disintegrating tablet Take 1 tablet (4 mg total) by mouth every 8 (eight) hours as needed for nausea or vomiting. 05/27/20   Hall-Potvin, [place], PA-C  albuterol (PROVENTIL HFA;VENTOLIN HFA) 108 (90 BASE) MCG/ACT inhaler Inhale 1-2 puffs into the lungs every 6 (six) hours as needed for wheezing or shortness of breath.  05/21/20  [provider]  omeprazole (PRILOSEC) 20 MG capsule Take 1 capsule (20 mg total) by mouth 2 (two) times daily. Patient not taking: Reported on 02/10/2018 07/03/17 09/18/19  [name], MD  SUMAtriptan (IMITREX) 100 MG tablet Take 1 tablet earliest onset of migraine.  May repeat once in 2 hours if headache persists or recurs.  Do not exceed 2 tablets in 24 hours 02/10/18 05/21/20  [name], DO    Family History Family History  Problem Relation Age of Onset  . Hypertension Father   . Hypertension Mother   . Kidney disease Mother   . Cervical cancer Paternal Aunt   . Congestive Heart Failure Maternal Grandmother   .  Breast cancer Paternal Grandfather     Social History Social History   Tobacco Use  . Smoking status: Never Smoker  . Smokeless tobacco: Never Used  Substance Use Topics  . Alcohol use: Yes    Comment: social  . Drug use: No     Allergies   Shellfish allergy, Adhesive [tape], Latex, and Betadine [povidone iodine]   Review of Systems As per HPI   Physical Exam Triage Vital Signs ED Triage Vitals  Enc Vitals Group     BP 05/27/20 1338 129/81     Pulse Rate 05/27/20 1338 81     Resp 05/27/20 1338 18     Temp 05/27/20 1338 98.2 F (36.8 C)     Temp Source 05/27/20 1338 Oral     SpO2 05/27/20 1338 98 %     Weight --      Height --      Head Circumference --      Peak Flow --      Pain Score 05/27/20 1345 6     Pain Loc --      Pain Edu? --      Excl. in GC? --    No data found.  Updated  Vital Signs BP 129/81 (BP Location: Left Arm)   Pulse 81   Temp 98.2 F (36.8 C) (Oral)   Resp 18   LMP 05/04/2020   SpO2 98%   Visual Acuity Right Eye Distance:   Left Eye Distance:   Bilateral Distance:    Right Eye Near:   Left Eye Near:    Bilateral Near:     Physical Exam Constitutional:      General: She is not in acute distress. HENT:     Head: Normocephalic and atraumatic.  Eyes:     General: No scleral icterus.    Pupils: Pupils are equal, round, and reactive to light.  Cardiovascular:     Rate and Rhythm: Normal rate.  Pulmonary:     Effort: Pulmonary effort is normal.  Abdominal:     General: Bowel sounds are normal.     Palpations: Abdomen is soft.     Tenderness: There is no abdominal tenderness.  Skin:    Coloration: Skin is not jaundiced or pale.  Neurological:     Mental Status: She is alert and oriented to person, place, and time.      UC Treatments / Results  Labs (all labs ordered are listed, but only abnormal results are displayed) Labs Reviewed  POCT URINE PREGNANCY    EKG   Radiology No results found.  Procedures Procedures (including critical care time)  Medications Ordered in UC Medications  ondansetron (ZOFRAN-ODT) disintegrating tablet 4 mg (has no administration in time range)    Initial Impression / Assessment and Plan / UC Course  I have reviewed the triage vital signs and the nursing notes.  Pertinent labs & imaging results that were available during my care of the patient were reviewed by me and considered in my medical decision making (see chart for details).     Patient febrile, nontoxic in office today.  Urine pregnancy negative.  Will treat supportively as outlined below.  Return precautions discussed, pt verbalized understanding and is agreeable to plan. Final Clinical Impressions(s) / UC Diagnoses   Final diagnoses:  COVID-19 virus infection     Discharge Instructions     It is very important to  remember that since you have tested positive for Covid you need to be  staying home and quarantining to help keep others safe and healthy in the community.  If you would like further evaluation for persistent or worsening symptoms, you may schedule an E-visit or virtual (video) visit throughout the [facility] app or website.  PLEASE NOTE: If you develop severe chest pain or shortness of breath please go to the ER or call [phone] for further evaluation --> DO NOT schedule electronic or virtual visits for this. Please call our office for further guidance / recommendations as needed.  For information about the Covid vaccine, please visit [email or web address]    ED Prescriptions    Medication Sig Dispense Auth. Provider   ondansetron (ZOFRAN ODT) 4 MG disintegrating tablet Take 1 tablet (4 mg total) by mouth every 8 (eight) hours as needed for nausea or vomiting. 21 tablet Hall-Potvin, [place], PA-C   cetirizine (ZYRTEC ALLERGY) 10 MG tablet Take 1 tablet (10 mg total) by mouth daily. 30 tablet Hall-Potvin, [place], PA-C   fluticasone (FLONASE) 50 MCG/ACT nasal spray Place 1 spray into both nostrils daily. 16 g Hall-Potvin, [place], PA-C     PDMP not reviewed this encounter.   Hall-Potvin, [place], [place] 05/27/20 1433

## 2020-09-09 ENCOUNTER — Ambulatory Visit
Admission: EM | Admit: 2020-09-09 | Discharge: 2020-09-09 | Disposition: A | Discharge status: Home / Self Care | Payer: Medicaid Other | Attending: Family Medicine | Admitting: Family Medicine

## 2020-09-09 ENCOUNTER — Other Ambulatory Visit: Payer: Self-pay

## 2020-09-09 ENCOUNTER — Encounter: Payer: Self-pay | Admitting: Emergency Medicine

## 2020-09-09 DIAGNOSIS — M25559 Pain in unspecified hip: Secondary | ICD-10-CM | POA: Diagnosis present

## 2020-09-09 LAB — POCT URINALYSIS DIP (MANUAL ENTRY)
Bilirubin, UA: NEGATIVE
Glucose, UA: NEGATIVE mg/dL
Ketones, POC UA: NEGATIVE mg/dL
Leukocytes, UA: NEGATIVE
Nitrite, UA: NEGATIVE
Protein Ur, POC: 30 mg/dL — AB
Spec Grav, UA: 1.025 (ref 1.010–1.025)
Urobilinogen, UA: 0.2 E.U./dL
pH, UA: 7 (ref 5.0–8.0)

## 2020-09-09 MED ORDER — CEFTRIAXONE SODIUM 500 MG IJ SOLR
500.0000 mg | Freq: Once | INTRAMUSCULAR | Status: AC
  Administered 2020-09-09: 500 mg via INTRAMUSCULAR

## 2020-09-09 MED ORDER — DOXYCYCLINE HYCLATE 100 MG PO TABS
100.0000 mg | ORAL_TABLET | Freq: Two times a day (BID) | ORAL | 0 refills | Status: DC
Start: 2020-09-09 — End: 2020-10-06

## 2020-09-09 NOTE — ED Provider Notes (Signed)
[name]    CSN: [id number] Arrival date & time: 09/09/20  0950      History   Chief Complaint Chief Complaint  Patient presents with  . Vaginal Pain    HPI Deanna Myers is a 29 y.o. female.   Established [facility] patient  Pt here for vaginal pain x 1 week; pt denies vaginal discharge   Patient has same partner, but he has been nonmonogamous before and gave her chlamydia with gave her similar symptoms as what she now has:  Lower pelvic pain without discharge or abnl bleeding  LMP 2 weeks ago     Past Medical History:  Diagnosis Date  . ADHD (attention deficit hyperactivity disorder)    since childhood  . Asthma    inhaler last used prior to pregnancy  . BV (bacterial vaginosis)   . Chlamydia   . GERD (gastroesophageal reflux disease)   . Gonorrhea   . Hyperemesis    G3    Patient Active Problem List   Diagnosis Date Noted  . Active labor at term 12/21/2015  . UTI (urinary tract infection) 06/13/2015  . Pyelonephritis affecting pregnancy in first trimester 06/12/2015  . Folliculitis 02/13/2013    Past Surgical History:  Procedure Laterality Date  . CESAREAN SECTION N/A 12/21/2015   Procedure: CESAREAN SECTION;  Surgeon: [name], MD;  Location: [facility];  Service: Obstetrics;  Laterality: N/A;  . NO PAST SURGERIES      OB History    Gravida  3   Para  2   Term  2   Preterm      AB  1   Living  2     SAB  1   TAB      Ectopic      Multiple  0   Live Births  2            Home Medications    Prior to Admission medications   Medication Sig Start Date End Date Taking? Authorizing Provider  cetirizine (ZYRTEC ALLERGY) 10 MG tablet Take 1 tablet (10 mg total) by mouth daily. 05/27/20   Hall-Potvin, [place], PA-C  doxycycline (VIBRA-TABS) 100 MG tablet Take 1 tablet (100 mg total) by mouth 2 (two) times daily. 09/09/20   Elvina Sidle, MD  EPINEPHrine 0.3 mg/0.3 mL IJ SOAJ injection Inject 0.3 mg into the  muscle once.    [provider]  fluticasone (FLONASE) 50 MCG/ACT nasal spray Place 1 spray into both nostrils daily. 05/27/20   Hall-Potvin, [place], PA-C  ondansetron (ZOFRAN ODT) 4 MG disintegrating tablet Take 1 tablet (4 mg total) by mouth every 8 (eight) hours as needed for nausea or vomiting. 05/27/20   Hall-Potvin, [place], PA-C  albuterol (PROVENTIL HFA;VENTOLIN HFA) 108 (90 BASE) MCG/ACT inhaler Inhale 1-2 puffs into the lungs every 6 (six) hours as needed for wheezing or shortness of breath.  05/21/20  [provider]  omeprazole (PRILOSEC) 20 MG capsule Take 1 capsule (20 mg total) by mouth 2 (two) times daily. Patient not taking: Reported on 02/10/2018 07/03/17 09/18/19  [name], MD  SUMAtriptan (IMITREX) 100 MG tablet Take 1 tablet earliest onset of migraine.  May repeat once in 2 hours if headache persists or recurs.  Do not exceed 2 tablets in 24 hours 02/10/18 05/21/20  [name], DO    Family History Family History  Problem Relation Age of Onset  . Hypertension Father   . Hypertension Mother   . Kidney disease Mother   .  Cervical cancer Paternal Aunt   . Congestive Heart Failure Maternal Grandmother   . Breast cancer Paternal Grandfather     Social History Social History   Tobacco Use  . Smoking status: Never Smoker  . Smokeless tobacco: Never Used  Substance Use Topics  . Alcohol use: Yes    Comment: social  . Drug use: No     Allergies   Shellfish allergy, Adhesive [tape], Latex, and Betadine [povidone iodine]   Review of Systems Review of Systems  All other systems reviewed and are negative.    Physical Exam Triage Vital Signs ED Triage Vitals [09/09/20 1038]  Enc Vitals Group     BP 121/84     Pulse Rate 96     Resp 18     Temp 98.2 F (36.8 C)     Temp Source Oral     SpO2 100 %     Weight      Height      Head Circumference      Peak Flow      Pain Score 5     Pain Loc      Pain Edu?      Excl. in GC?    No  data found.  Updated Vital Signs BP 121/84 (BP Location: Left Arm)   Pulse 96   Temp 98.2 F (36.8 C) (Oral)   Resp 18   SpO2 100%    Physical Exam Vitals and nursing note reviewed.  Constitutional:      Appearance: Normal appearance.  Eyes:     Conjunctiva/sclera: Conjunctivae normal.  Pulmonary:     Effort: Pulmonary effort is normal.  Abdominal:     Comments: Mild pelvic pain with deep palpation  Musculoskeletal:        General: Normal range of motion.     Cervical back: Normal range of motion and neck supple.  Skin:    General: Skin is warm and dry.  Neurological:     General: No focal deficit present.     Mental Status: She is alert and oriented to person, place, and time.  Psychiatric:        Mood and Affect: Mood normal.        Thought Content: Thought content normal.      UC Treatments / Results  Labs (all labs ordered are listed, but only abnormal results are displayed) Labs Reviewed  POCT URINALYSIS DIP (MANUAL ENTRY) - Abnormal; Notable for the following components:      Result Value   Blood, UA trace-intact (*)    Protein Ur, POC =30 (*)    All other components within normal limits  CERVICOVAGINAL ANCILLARY ONLY    EKG   Radiology No results found.  Procedures Procedures (including critical care time)  Medications Ordered in UC Medications  cefTRIAXone (ROCEPHIN) injection 500 mg (500 mg Intramuscular Given 09/09/20 1103)    Initial Impression / Assessment and Plan / UC Course  I have reviewed the triage vital signs and the nursing notes.  Pertinent labs & imaging results that were available during my care of the patient were reviewed by me and considered in my medical decision making (see chart for details).    Final Clinical Impressions(s) / UC Diagnoses   Final diagnoses:  Pain in joint involving pelvic region and thigh, unspecified laterality     Discharge Instructions     Test results should be available in 24-48  hours    ED Prescriptions    Medication  Sig Dispense Auth. Provider   doxycycline (VIBRA-TABS) 100 MG tablet Take 1 tablet (100 mg total) by mouth 2 (two) times daily. 14 tablet Elvina Sidle, MD     PDMP not reviewed this encounter.   Elvina Sidle, MD 09/09/20 1104

## 2020-09-09 NOTE — ED Triage Notes (Signed)
Pt here for vaginal pain x 1 week; pt denies vaginal discharge

## 2020-09-09 NOTE — Discharge Instructions (Addendum)
Test results should be available in 24-48 hours

## 2020-09-10 LAB — CERVICOVAGINAL ANCILLARY ONLY
Chlamydia: NEGATIVE
Comment: NEGATIVE
Comment: NEGATIVE
Comment: NORMAL
Neisseria Gonorrhea: NEGATIVE
Trichomonas: NEGATIVE

## 2020-10-06 ENCOUNTER — Other Ambulatory Visit: Payer: Self-pay

## 2020-10-06 ENCOUNTER — Encounter (HOSPITAL_COMMUNITY): Payer: Self-pay

## 2020-10-06 ENCOUNTER — Emergency Department (HOSPITAL_COMMUNITY)
Admission: EM | Admit: 2020-10-06 | Discharge: 2020-10-07 | Disposition: A | Discharge status: Home / Self Care | Payer: Medicaid Other | Attending: Emergency Medicine | Admitting: Emergency Medicine

## 2020-10-06 ENCOUNTER — Ambulatory Visit
Admission: EM | Admit: 2020-10-06 | Discharge: 2020-10-06 | Disposition: A | Discharge status: Home / Self Care | Payer: Medicaid Other | Attending: Emergency Medicine | Admitting: Emergency Medicine

## 2020-10-06 DIAGNOSIS — Z5321 Procedure and treatment not carried out due to patient leaving prior to being seen by health care provider: Secondary | ICD-10-CM | POA: Diagnosis not present

## 2020-10-06 DIAGNOSIS — R1031 Right lower quadrant pain: Secondary | ICD-10-CM

## 2020-10-06 DIAGNOSIS — K3580 Unspecified acute appendicitis: Secondary | ICD-10-CM | POA: Insufficient documentation

## 2020-10-06 LAB — POCT URINALYSIS DIP (MANUAL ENTRY)
Glucose, UA: NEGATIVE mg/dL
Leukocytes, UA: NEGATIVE
Nitrite, UA: NEGATIVE
Protein Ur, POC: 30 mg/dL — AB
Spec Grav, UA: >=1.03 — AB (ref 1.010–1.025)
Urobilinogen, UA: 1 E.U./dL
pH, UA: 6 (ref 5.0–8.0)

## 2020-10-06 LAB — COMPREHENSIVE METABOLIC PANEL
ALT: 17 U/L (ref 0–44)
AST: 19 U/L (ref 15–41)
Albumin: 3.9 g/dL (ref 3.5–5.0)
Alkaline Phosphatase: 45 U/L (ref 38–126)
Anion gap: 13 (ref 5–15)
BUN: 6 mg/dL (ref 6–20)
CO2: 25 mmol/L (ref 22–32)
Calcium: 9.3 mg/dL (ref 8.9–10.3)
Chloride: 100 mmol/L (ref 98–111)
Creatinine, Ser: 0.62 mg/dL (ref 0.44–1.00)
GFR, Estimated: >60 mL/min (ref >60)
Glucose, Bld: 93 mg/dL (ref 70–99)
Potassium: 3.4 mmol/L — ABNORMAL LOW (ref 3.5–5.1)
Sodium: 138 mmol/L (ref 135–145)
Total Bilirubin: 0.8 mg/dL (ref 0.3–1.2)
Total Protein: 7.7 g/dL (ref 6.5–8.1)

## 2020-10-06 LAB — CBC
HCT: 37.1 % (ref 36.0–46.0)
Hemoglobin: 12.5 g/dL (ref 12.0–15.0)
MCH: 28.8 pg (ref 26.0–34.0)
MCHC: 33.7 g/dL (ref 30.0–36.0)
MCV: 85.5 fL (ref 80.0–100.0)
Platelets: 291 10*3/uL (ref 150–400)
RBC: 4.34 MIL/uL (ref 3.87–5.11)
RDW: 13.1 % (ref 11.5–15.5)
WBC: 6.9 10*3/uL (ref 4.0–10.5)
nRBC: 0 % (ref 0.0–0.2)

## 2020-10-06 LAB — POCT URINE PREGNANCY: Preg Test, Ur: NEGATIVE

## 2020-10-06 LAB — I-STAT BETA HCG BLOOD, ED (MC, WL, AP ONLY): I-stat hCG, quantitative: <5 m[IU]/mL (ref <5)

## 2020-10-06 LAB — LIPASE, BLOOD: Lipase: 19 U/L (ref 11–51)

## 2020-10-06 NOTE — ED Triage Notes (Signed)
Patient sent from UC for r/o appendicitis, patent has had vomiting, nausea, and diarrhea starting last night with RLQ pain, also with urinary symptoms x 2-3 days.

## 2020-10-06 NOTE — ED Triage Notes (Signed)
Pt states vomiting x6/nausea/diarrhea last night then developed RLQ sharp/shooting pain. States only vomit x2 this am. States having urinary frequency x2-3 days ago.

## 2020-10-06 NOTE — ED Provider Notes (Signed)
[facility]    CSN: [id number] Arrival date & time: 10/06/20  1404      History   Chief Complaint Chief Complaint  Patient presents with  . Abdominal Pain  . Emesis    HPI Deanna Myers is a 30 y.o. female presenting today for evaluation of abdominal pain.  Reports yesterday afternoon with nausea and vomiting.  Began to develop severe sharp pain in her right lower quadrant last night, slightly eased off since.  Continues to have pain worsening with movement with some continued nausea.  Also associated diarrhea.  Denies history of cysts.  Has had some urinary frequency, but denies dysuria.  Denies history UTIs.  Denies any vaginal symptoms.  Has had prior C-section, but no other abdominal surgeries.  Denies associated URI symptoms  Patient was seen here on 12/7 for vaginal discharge, had vaginal swab which was negative for STDs.  HPI  Past Medical History:  Diagnosis Date  . ADHD (attention deficit hyperactivity disorder)    since childhood  . Asthma    inhaler last used prior to pregnancy  . BV (bacterial vaginosis)   . Chlamydia   . GERD (gastroesophageal reflux disease)   . Gonorrhea   . Hyperemesis    G3    Patient Active Problem List   Diagnosis Date Noted  . Active labor at term 12/21/2015  . UTI (urinary tract infection) 06/13/2015  . Pyelonephritis affecting pregnancy in first trimester 06/12/2015  . Folliculitis 35/36/1443    Past Surgical History:  Procedure Laterality Date  . CESAREAN SECTION N/A 12/21/2015   Procedure: CESAREAN SECTION;  Surgeon: Paula Compton, MD;  Location: [facility];  Service: Obstetrics;  Laterality: N/A;  . NO PAST SURGERIES      OB History    Gravida  3   Para  2   Term  2   Preterm      AB  1   Living  2     SAB  1   IAB      Ectopic      Multiple  0   Live Births  2            Home Medications    Prior to Admission medications   Medication Sig Start Date End Date Taking?  Authorizing Provider  EPINEPHrine 0.3 mg/0.3 mL IJ SOAJ injection Inject 0.3 mg into the muscle once.    [provider]  albuterol (PROVENTIL HFA;VENTOLIN HFA) 108 (90 BASE) MCG/ACT inhaler Inhale 1-2 puffs into the lungs every 6 (six) hours as needed for wheezing or shortness of breath.  05/21/20  [provider]  omeprazole (PRILOSEC) 20 MG capsule Take 1 capsule (20 mg total) by mouth 2 (two) times daily. Patient not taking: Reported on 02/10/2018 07/03/17 09/18/19  Tanna Furry, MD  SUMAtriptan (IMITREX) 100 MG tablet Take 1 tablet earliest onset of migraine.  May repeat once in 2 hours if headache persists or recurs.  Do not exceed 2 tablets in 24 hours 02/10/18 05/21/20  Pieter Partridge, DO    Family History Family History  Problem Relation Age of Onset  . Hypertension Father   . Hypertension Mother   . Kidney disease Mother   . Cervical cancer Paternal Aunt   . Congestive Heart Failure Maternal Grandmother   . Breast cancer Paternal Grandfather     Social History Social History   Tobacco Use  . Smoking status: Never Smoker  . Smokeless tobacco: Never Used  Substance Use  Topics  . Alcohol use: Yes    Comment: social  . Drug use: No     Allergies   Shellfish allergy, Adhesive [tape], Latex, and Betadine [povidone iodine]   Review of Systems Review of Systems  Constitutional: Negative for fever.  Respiratory: Negative for shortness of breath.   Cardiovascular: Negative for chest pain.  Gastrointestinal: Positive for abdominal pain, diarrhea, nausea and vomiting.  Genitourinary: Negative for dysuria, flank pain, genital sores, hematuria, menstrual problem, vaginal bleeding, vaginal discharge and vaginal pain.  Musculoskeletal: Negative for back pain.  Skin: Negative for rash.  Neurological: Negative for dizziness, light-headedness and headaches.     Physical Exam Triage Vital Signs ED Triage Vitals  Enc Vitals Group     BP      Pulse      Resp       Temp      Temp src      SpO2      Weight      Height      Head Circumference      Peak Flow      Pain Score      Pain Loc      Pain Edu?      Excl. in GC?    No data found.  Updated Vital Signs BP 106/77 (BP Location: Left Arm)   Pulse 82   Temp 98 F (36.7 C) (Oral)   Resp 18   LMP 09/27/2020   SpO2 99%   Visual Acuity Right Eye Distance:   Left Eye Distance:   Bilateral Distance:    Right Eye Near:   Left Eye Near:    Bilateral Near:     Physical Exam Vitals and nursing note reviewed.  Constitutional:      Appearance: She is well-developed and well-nourished.     Comments: No acute distress  HENT:     Head: Normocephalic and atraumatic.     Nose: Nose normal.  Eyes:     Conjunctiva/sclera: Conjunctivae normal.  Cardiovascular:     Rate and Rhythm: Normal rate.  Pulmonary:     Effort: Pulmonary effort is normal. No respiratory distress.  Abdominal:     General: There is no distension.     Comments: Appears uncomfortable with changing position  Positive McBurney's, positive Rovsing, negative rebound   Musculoskeletal:        General: Normal range of motion.     Cervical back: Neck supple.  Skin:    General: Skin is warm and dry.  Neurological:     Mental Status: She is alert and oriented to person, place, and time.  Psychiatric:        Mood and Affect: Mood and affect normal.      UC Treatments / Results  Labs (all labs ordered are listed, but only abnormal results are displayed) Labs Reviewed  POCT URINALYSIS DIP (MANUAL ENTRY) - Abnormal; Notable for the following components:      Result Value   Bilirubin, UA small (*)    Ketones, POC UA moderate (40) (*)    Spec Grav, UA >=1.030 (*)    Blood, UA small (*)    Protein Ur, POC =30 (*)    All other components within normal limits  POCT URINE PREGNANCY    EKG   Radiology No results found.  Procedures Procedures (including critical care time)  Medications Ordered in  UC Medications - No data to display  Initial Impression / Assessment and Plan / UC Course  I have reviewed the triage vital signs and the nursing notes.  Pertinent labs & imaging results that were available during my care of the patient were reviewed by me and considered in my medical decision making (see chart for details).     Severe right lower quadrant pain to palpation with acute onset, UA without signs of UTI, does appear dehydrated likely secondary to recent nausea vomiting.  Cannot rule out appendicitis, sending to emergency room for further imaging and treatment.  Discussed strict return precautions. Patient verbalized understanding and is agreeable with plan.  Final Clinical Impressions(s) / UC Diagnoses   Final diagnoses:  Right lower quadrant abdominal pain     Discharge Instructions     RLQ pain, with nausea and vomiting    ED Prescriptions    None     PDMP not reviewed this encounter.   Lew Dawes, [place] 10/06/20 1807

## 2020-10-06 NOTE — ED Notes (Signed)
Called pt, said she had to check on daughter, is on the way back about 7 min.

## 2020-10-06 NOTE — Discharge Instructions (Signed)
RLQ pain, with nausea and vomiting

## 2020-10-07 NOTE — ED Notes (Signed)
Patient handed stickers to registation and left. LWBS, moving OTF.

## 2020-11-04 ENCOUNTER — Other Ambulatory Visit: Payer: Self-pay

## 2020-11-04 ENCOUNTER — Inpatient Hospital Stay (HOSPITAL_COMMUNITY): Payer: Medicaid Other

## 2020-11-04 ENCOUNTER — Encounter (HOSPITAL_COMMUNITY): Payer: Self-pay | Admitting: Obstetrics and Gynecology

## 2020-11-04 ENCOUNTER — Inpatient Hospital Stay (HOSPITAL_COMMUNITY)
Admission: AD | Admit: 2020-11-04 | Discharge: 2020-11-04 | Disposition: A | Discharge status: Home / Self Care | Payer: Medicaid Other | Attending: Obstetrics and Gynecology | Admitting: Obstetrics and Gynecology

## 2020-11-04 DIAGNOSIS — O26891 Other specified pregnancy related conditions, first trimester: Secondary | ICD-10-CM | POA: Diagnosis present

## 2020-11-04 DIAGNOSIS — Z3490 Encounter for supervision of normal pregnancy, unspecified, unspecified trimester: Secondary | ICD-10-CM

## 2020-11-04 DIAGNOSIS — Z3A01 Less than 8 weeks gestation of pregnancy: Secondary | ICD-10-CM

## 2020-11-04 DIAGNOSIS — R102 Pelvic and perineal pain: Secondary | ICD-10-CM

## 2020-11-04 DIAGNOSIS — O26899 Other specified pregnancy related conditions, unspecified trimester: Secondary | ICD-10-CM

## 2020-11-04 LAB — URINALYSIS, ROUTINE W REFLEX MICROSCOPIC
Bilirubin Urine: NEGATIVE
Glucose, UA: NEGATIVE mg/dL
Hgb urine dipstick: NEGATIVE
Ketones, ur: NEGATIVE mg/dL
Leukocytes,Ua: NEGATIVE
Nitrite: NEGATIVE
Protein, ur: NEGATIVE mg/dL
Specific Gravity, Urine: 1.025 (ref 1.005–1.030)
pH: 6 (ref 5.0–8.0)

## 2020-11-04 LAB — CBC
HCT: 32.3 % — ABNORMAL LOW (ref 36.0–46.0)
Hemoglobin: 11.3 g/dL — ABNORMAL LOW (ref 12.0–15.0)
MCH: 29 pg (ref 26.0–34.0)
MCHC: 35 g/dL (ref 30.0–36.0)
MCV: 82.8 fL (ref 80.0–100.0)
Platelets: 306 10*3/uL (ref 150–400)
RBC: 3.9 MIL/uL (ref 3.87–5.11)
RDW: 13.6 % (ref 11.5–15.5)
WBC: 5.6 10*3/uL (ref 4.0–10.5)
nRBC: 0 % (ref 0.0–0.2)

## 2020-11-04 LAB — HCG, QUANTITATIVE, PREGNANCY: hCG, Beta Chain, Quant, S: 10182 m[IU]/mL — ABNORMAL HIGH (ref <5)

## 2020-11-04 LAB — WET PREP, GENITAL
Sperm: NONE SEEN
Trich, Wet Prep: NONE SEEN
WBC, Wet Prep HPF POC: NONE SEEN
Yeast Wet Prep HPF POC: NONE SEEN

## 2020-11-04 LAB — POCT PREGNANCY, URINE: Preg Test, Ur: POSITIVE — AB

## 2020-11-04 NOTE — MAU Provider Note (Signed)
History     CSN: [id number]  Arrival date and time: 11/04/20 3536   Event Date/Time   First Provider Initiated Contact with Patient 11/04/20 [phone]      Chief Complaint  Patient presents with  . Pelvic Pain   30 y.o. [id number] @[redacted]w[redacted]d  by LMP presenting with pelvic pain. Reports onset 2 mos ago. Describes as sharp, constant, and rates 7/10. Pain is worse with menstrual cycle. Radiates into leg and causes numbness at times. Denies VB, vaginal discharge, itching or malodor. Denies urinary sx. She did not have concerns for pregnancy as her menses are irregular.   OB History    Gravida  4   Para  2   Term  2   Preterm      AB  1   Living  2     SAB  1   IAB      Ectopic      Multiple  0   Live Births  2           Past Medical History:  Diagnosis Date  . ADHD (attention deficit hyperactivity disorder)    since childhood  . Asthma    inhaler last used prior to pregnancy  . BV (bacterial vaginosis)   . Chlamydia   . GERD (gastroesophageal reflux disease)   . Gonorrhea   . Hyperemesis    G3    Past Surgical History:  Procedure Laterality Date  . CESAREAN SECTION N/A 12/21/2015   Procedure: CESAREAN SECTION;  Surgeon: 12/23/2015, MD;  Location: [facility];  Service: Obstetrics;  Laterality: N/A;  . NO PAST SURGERIES      Family History  Problem Relation Age of Onset  . Hypertension Father   . Hypertension Mother   . Kidney disease Mother   . Cervical cancer Paternal Aunt   . Congestive Heart Failure Maternal Grandmother   . Breast cancer Paternal Grandfather     Social History   Tobacco Use  . Smoking status: Never Smoker  . Smokeless tobacco: Never Used  Substance Use Topics  . Alcohol use: Yes    Comment: social  . Drug use: No    Allergies:  Allergies  Allergen Reactions  . Shellfish Allergy Anaphylaxis  . Adhesive [Tape] Swelling and Other (See Comments)    Reaction:  Bruising   . Latex Hives  . Betadine [Povidone Iodine] Rash     No medications prior to admission.    Review of Systems  Gastrointestinal: Negative for abdominal pain.  Genitourinary: Positive for pelvic pain. Negative for dysuria, vaginal bleeding and vaginal discharge.   Physical Exam   Blood pressure (!) 117/53, pulse 82, temperature 98.7 F (37.1 C), temperature source Oral, resp. rate 20, height 5\' 6"  (1.676 m), weight 93.4 kg, last menstrual period 10/03/2020, SpO2 100 %.  Physical Exam Vitals and nursing note reviewed. Exam conducted with a chaperone present.  Constitutional:      General: She is not in acute distress.    Appearance: Normal appearance.  HENT:     Head: Normocephalic and atraumatic.  Cardiovascular:     Rate and Rhythm: Normal rate.  Pulmonary:     Effort: Pulmonary effort is normal. No respiratory distress.  Abdominal:     General: There is no distension.     Palpations: Abdomen is soft. There is no mass.     Tenderness: There is no abdominal tenderness. There is no guarding or rebound.     Hernia: No hernia is present.  Genitourinary:    Comments: External: no lesions or erythema Vagina: rugated, pink, moist, thin white discharge Uterus: non enlarged, anteverted, non tender, no CMT Adnexae: no masses, + tenderness left, no tenderness right Cervix closed  Musculoskeletal:        General: Normal range of motion.     Cervical back: Normal range of motion.  Skin:    General: Skin is warm and dry.  Neurological:     General: No focal deficit present.     Mental Status: She is alert and oriented to person, place, and time.  Psychiatric:        Mood and Affect: Mood normal.        Behavior: Behavior normal.    Results for orders placed or performed during the hospital encounter of 11/04/20 (from the past 24 hour(s))  Pregnancy, urine POC     Status: Abnormal   Collection Time: 11/04/20  8:13 AM  Result Value Ref Range   Preg Test, Ur POSITIVE (A) NEGATIVE  Urinalysis, Routine w reflex microscopic Urine,  Clean Catch     Status: Abnormal   Collection Time: 11/04/20  8:22 AM  Result Value Ref Range   Color, Urine YELLOW YELLOW   APPearance HAZY (A) CLEAR   Specific Gravity, Urine 1.025 1.005 - 1.030   pH 6.0 5.0 - 8.0   Glucose, UA NEGATIVE NEGATIVE mg/dL   Hgb urine dipstick NEGATIVE NEGATIVE   Bilirubin Urine NEGATIVE NEGATIVE   Ketones, ur NEGATIVE NEGATIVE mg/dL   Protein, ur NEGATIVE NEGATIVE mg/dL   Nitrite NEGATIVE NEGATIVE   Leukocytes,Ua NEGATIVE NEGATIVE  CBC     Status: Abnormal   Collection Time: 11/04/20  8:45 AM  Result Value Ref Range   WBC 5.6 4.0 - 10.5 K/uL   RBC 3.90 3.87 - 5.11 MIL/uL   Hemoglobin 11.3 (L) 12.0 - 15.0 g/dL   HCT 53.6 (L) 64.4 - 03.4 %   MCV 82.8 80.0 - 100.0 fL   MCH 29.0 26.0 - 34.0 pg   MCHC 35.0 30.0 - 36.0 g/dL   RDW 74.2 59.5 - 63.8 %   Platelets 306 150 - 400 K/uL   nRBC 0.0 0.0 - 0.2 %  Wet prep, genital     Status: Abnormal   Collection Time: 11/04/20  9:04 AM   Specimen: Vaginal  Result Value Ref Range   Yeast Wet Prep HPF POC NONE SEEN NONE SEEN   Trich, Wet Prep NONE SEEN NONE SEEN   Clue Cells Wet Prep HPF POC PRESENT (A) NONE SEEN   WBC, Wet Prep HPF POC NONE SEEN NONE SEEN   Sperm NONE SEEN    US OB LESS THAN 14 WEEKS WITH OB TRANSVAGINAL  Result Date: 11/04/2020 CLINICAL DATA:  30 year old pregnant female with sharp pelvic pain for 2 months. Quantitative beta HCG pending. EDC by LMP: 07/10/2021, projecting to an expected gestational age of [redacted] weeks 4 days EXAM: OBSTETRIC <14 WK [name] AND TRANSVAGINAL OB US TECHNIQUE: Both transabdominal and transvaginal ultrasound examinations were performed for complete evaluation of the gestation as well as the maternal uterus, adnexal regions, and pelvic cul-de-sac. Transvaginal technique was performed to assess early pregnancy. COMPARISON:  No prior scans from this gestation. FINDINGS: Intrauterine gestational sac: Single Yolk sac:  Visualized. Embryo:  Not Visualized. Cardiac Activity: Not  Visualized. MSD: 11.0 mm   5 w   6 d Subchorionic hemorrhage:  None visualized. Maternal uterus/adnexae: Retroverted uterus with no uterine fibroids. Right ovary measures 3.5 x 1.7 x  1.4. Left ovary measures 3.4 x 2.4 x 2.7 cm and contains a corpus luteum. No suspicious ovarian or adnexal masses. No abnormal free fluid in the pelvis. IMPRESSION: 1. Single intrauterine gestational sac with yolk sac measuring 5 weeks 6 days by mean sac diameter. No embryo visualized at this time, which could be due to early gestational age. Recommend follow-up obstetric scan in 11-14 days for definitive diagnosis. This recommendation follows SRU consensus guidelines: Diagnostic Criteria for Nonviable Pregnancy Early in the First Trimester. Malva Limes Med 2013; 720:9470-96. 2. Normal ovaries.  No adnexal masses. Electronically Signed   By: Delbert Phenix M.D.   On: 11/04/2020 09:50   MAU Course  Procedures  MDM Labs and [name] ordered and reviewed. Early IUP identified on [name], no FP seen yet. Discussed findings with pt. No signs of UTI, GC pending. Discussed comfort measures for pelvic pain. Start PNV. Stable for discharge home.   Assessment and Plan   1. [redacted] weeks gestation of pregnancy   2. Pelvic pain affecting pregnancy   3. Early stage of pregnancy    Discharge home Follow up at [facility] to start care SAB precautions  Allergies as of 11/04/2020      Reactions   Shellfish Allergy Anaphylaxis   Adhesive [tape] Swelling, Other (See Comments)   Reaction:  Bruising    Latex Hives   Betadine [povidone Iodine] Rash      Medication List    TAKE these medications   EPINEPHrine 0.3 mg/0.3 mL Soaj injection Commonly known as: EPI-PEN Inject 0.3 mg into the muscle once.      Donette Larry, CNM 11/04/2020, 11:17 AM

## 2020-11-04 NOTE — Discharge Instructions (Signed)
Abdominal Pain During Pregnancy Belly (abdominal) pain is common during pregnancy. There are many possible causes. Some causes are more serious than others. Sometimes the cause is not known. Always tell your doctor if you have belly pain. Follow these instructions at home:  Do not have sex or put anything in your vagina until your pain goes away completely.  Get plenty of rest until your pain gets better.  Drink enough fluid to keep your pee (urine) pale yellow.  Take over-the-counter and prescription medicines only as told by your doctor.  Keep all follow-up visits.   Contact a doctor if:  You keep having pain after resting.  Your pain gets worse after resting.  You have lower belly pain that: ? Comes and goes at regular times. ? Spreads to your back. ? Feels like menstrual cramps.  You have pain or burning when you pee (urinate). Get help right away if:  You have a fever or chills.  You feel like it is hard to breathe.  You have bleeding from your vagina.  You are leaking fluid or tissue from your vagina.  You vomit for more than 24 hours.  You have watery poop (diarrhea) for more than 24 hours.  Your baby is moving less than usual.  You feel very weak or faint.  You have very bad pain in your upper belly. Summary  Belly pain is common during pregnancy. There are many possible causes.  If you have belly pain during pregnancy, tell your doctor right away.  Keep all follow-up visits. This information is not intended to replace advice given to you by your health care provider. Make sure you discuss any questions you have with your health care provider. Document Revised: 06/03/2020 Document Reviewed: 06/03/2020 Elsevier Patient Education  2021 Elsevier Inc.  

## 2020-11-04 NOTE — MAU Note (Signed)
Presents with sharp pelvic pain and labial discoloration.  Reports pain began 2 months ago.  Denies VB.  LMP 10/02/2020.

## 2020-11-05 LAB — GC/CHLAMYDIA PROBE AMP (~~LOC~~) NOT AT ARMC
Chlamydia: NEGATIVE
Comment: NEGATIVE
Comment: NORMAL
Neisseria Gonorrhea: NEGATIVE

## 2020-12-16 ENCOUNTER — Encounter (HOSPITAL_BASED_OUTPATIENT_CLINIC_OR_DEPARTMENT_OTHER): Payer: Self-pay

## 2020-12-16 ENCOUNTER — Other Ambulatory Visit: Payer: Self-pay

## 2020-12-16 ENCOUNTER — Emergency Department (HOSPITAL_BASED_OUTPATIENT_CLINIC_OR_DEPARTMENT_OTHER)
Admission: EM | Admit: 2020-12-16 | Discharge: 2020-12-16 | Disposition: A | Discharge status: Home / Self Care | Payer: Medicaid Other | Attending: Emergency Medicine | Admitting: Emergency Medicine

## 2020-12-16 DIAGNOSIS — Z91040 Latex allergy status: Secondary | ICD-10-CM | POA: Insufficient documentation

## 2020-12-16 DIAGNOSIS — M25552 Pain in left hip: Secondary | ICD-10-CM | POA: Insufficient documentation

## 2020-12-16 DIAGNOSIS — M79605 Pain in left leg: Secondary | ICD-10-CM

## 2020-12-16 DIAGNOSIS — Y92410 Unspecified street and highway as the place of occurrence of the external cause: Secondary | ICD-10-CM | POA: Insufficient documentation

## 2020-12-16 DIAGNOSIS — J45909 Unspecified asthma, uncomplicated: Secondary | ICD-10-CM | POA: Insufficient documentation

## 2020-12-16 MED ORDER — LIDOCAINE 5 % EX PTCH: 1.0000 | MEDICATED_PATCH | CUTANEOUS | 0 refills | Status: DC

## 2020-12-16 MED ORDER — NAPROXEN 500 MG PO TABS: 500.0000 mg | ORAL_TABLET | Freq: Two times a day (BID) | ORAL | 0 refills | Status: DC

## 2020-12-16 MED ORDER — METHOCARBAMOL 500 MG PO TABS: 500.0000 mg | ORAL_TABLET | Freq: Two times a day (BID) | ORAL | 0 refills | Status: DC
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
## 2020-12-16 NOTE — Discharge Instructions (Addendum)
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
## 2020-12-24 ENCOUNTER — Ambulatory Visit (INDEPENDENT_AMBULATORY_CARE_PROVIDER_SITE_OTHER): Payer: Medicaid Other

## 2020-12-24 ENCOUNTER — Ambulatory Visit
Admission: EM | Admit: 2020-12-24 | Discharge: 2020-12-24 | Disposition: A | Discharge status: Home / Self Care | Payer: Medicaid Other | Attending: Emergency Medicine | Admitting: Emergency Medicine

## 2020-12-24 ENCOUNTER — Ambulatory Visit: Payer: Medicaid Other

## 2020-12-24 ENCOUNTER — Other Ambulatory Visit: Payer: Self-pay

## 2020-12-24 DIAGNOSIS — M5442 Lumbago with sciatica, left side: Secondary | ICD-10-CM

## 2020-12-24 DIAGNOSIS — M25552 Pain in left hip: Secondary | ICD-10-CM

## 2020-12-24 DIAGNOSIS — M545 Low back pain, unspecified: Secondary | ICD-10-CM

## 2020-12-24 MED ORDER — PREDNISONE 10 MG PO TABS: ORAL_TABLET | ORAL | 0 refills | Status: DC

## 2020-12-24 MED ORDER — TIZANIDINE HCL 4 MG PO TABS: 2.0000 mg | ORAL_TABLET | Freq: Four times a day (QID) | ORAL | 0 refills | Status: DC | PRN

## 2020-12-24 NOTE — Discharge Instructions (Addendum)
Begin prednisone taper over the next 6 days-begin with 6 tablets, decrease by 1 tablet each day until complete-6, 5, 4, 3, 2, 1 Supplement with tizanidine at home/bedtime, may cause drowsiness Alternate ice and heat to back and leg Gentle stretching Follow-up if not improving or worsening
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
## 2021-05-14 ENCOUNTER — Other Ambulatory Visit: Payer: Self-pay

## 2021-05-14 ENCOUNTER — Encounter (HOSPITAL_BASED_OUTPATIENT_CLINIC_OR_DEPARTMENT_OTHER): Payer: Self-pay | Admitting: Emergency Medicine

## 2021-05-14 ENCOUNTER — Emergency Department (HOSPITAL_BASED_OUTPATIENT_CLINIC_OR_DEPARTMENT_OTHER)
Admission: EM | Admit: 2021-05-14 | Discharge: 2021-05-14 | Disposition: A | Discharge status: Home / Self Care | Payer: Medicaid Other | Attending: Emergency Medicine | Admitting: Emergency Medicine

## 2021-05-14 ENCOUNTER — Emergency Department (HOSPITAL_BASED_OUTPATIENT_CLINIC_OR_DEPARTMENT_OTHER): Payer: Medicaid Other

## 2021-05-14 DIAGNOSIS — R1010 Upper abdominal pain, unspecified: Secondary | ICD-10-CM

## 2021-05-14 DIAGNOSIS — Z91040 Latex allergy status: Secondary | ICD-10-CM | POA: Insufficient documentation

## 2021-05-14 DIAGNOSIS — R52 Pain, unspecified: Secondary | ICD-10-CM

## 2021-05-14 DIAGNOSIS — R0789 Other chest pain: Secondary | ICD-10-CM | POA: Diagnosis not present

## 2021-05-14 DIAGNOSIS — K5900 Constipation, unspecified: Secondary | ICD-10-CM | POA: Insufficient documentation

## 2021-05-14 DIAGNOSIS — J45909 Unspecified asthma, uncomplicated: Secondary | ICD-10-CM | POA: Diagnosis not present

## 2021-05-14 DIAGNOSIS — N898 Other specified noninflammatory disorders of vagina: Secondary | ICD-10-CM | POA: Diagnosis not present

## 2021-05-14 DIAGNOSIS — N644 Mastodynia: Secondary | ICD-10-CM

## 2021-05-14 DIAGNOSIS — N6310 Unspecified lump in the right breast, unspecified quadrant: Secondary | ICD-10-CM | POA: Insufficient documentation

## 2021-05-14 LAB — CBC WITH DIFFERENTIAL/PLATELET
Abs Immature Granulocytes: 0.01 10*3/uL (ref 0.00–0.07)
Basophils Absolute: 0 10*3/uL (ref 0.0–0.1)
Basophils Relative: 0 %
Eosinophils Absolute: 0.2 10*3/uL (ref 0.0–0.5)
Eosinophils Relative: 2 %
HCT: 37.8 % (ref 36.0–46.0)
Hemoglobin: 12.3 g/dL (ref 12.0–15.0)
Immature Granulocytes: 0 %
Lymphocytes Relative: 52 %
Lymphs Abs: 3.6 10*3/uL (ref 0.7–4.0)
MCH: 27.8 pg (ref 26.0–34.0)
MCHC: 32.5 g/dL (ref 30.0–36.0)
MCV: 85.5 fL (ref 80.0–100.0)
Monocytes Absolute: 0.4 10*3/uL (ref 0.1–1.0)
Monocytes Relative: 5 %
Neutro Abs: 2.9 10*3/uL (ref 1.7–7.7)
Neutrophils Relative %: 41 %
Platelets: 274 10*3/uL (ref 150–400)
RBC: 4.42 MIL/uL (ref 3.87–5.11)
RDW: 13.5 % (ref 11.5–15.5)
WBC: 7.1 10*3/uL (ref 4.0–10.5)
nRBC: 0 % (ref 0.0–0.2)

## 2021-05-14 LAB — COMPREHENSIVE METABOLIC PANEL
ALT: 11 U/L (ref 0–44)
AST: 16 U/L (ref 15–41)
Albumin: 4.3 g/dL (ref 3.5–5.0)
Alkaline Phosphatase: 42 U/L (ref 38–126)
Anion gap: 9 (ref 5–15)
BUN: 10 mg/dL (ref 6–20)
CO2: 25 mmol/L (ref 22–32)
Calcium: 9 mg/dL (ref 8.9–10.3)
Chloride: 105 mmol/L (ref 98–111)
Creatinine, Ser: 0.56 mg/dL (ref 0.44–1.00)
GFR, Estimated: >60 mL/min (ref >60)
Glucose, Bld: 82 mg/dL (ref 70–99)
Potassium: 3.7 mmol/L (ref 3.5–5.1)
Sodium: 139 mmol/L (ref 135–145)
Total Bilirubin: 0.3 mg/dL (ref 0.3–1.2)
Total Protein: 7.8 g/dL (ref 6.5–8.1)

## 2021-05-14 LAB — HCG, SERUM, QUALITATIVE: Preg, Serum: NEGATIVE

## 2021-05-14 LAB — LIPASE, BLOOD: Lipase: 19 U/L (ref 11–51)

## 2021-05-14 MED ORDER — IOHEXOL 300 MG/ML  SOLN
100.0000 mL | Freq: Once | INTRAMUSCULAR | Status: AC | PRN
  Administered 2021-05-14: 100 mL via INTRAVENOUS

## 2021-05-14 MED ORDER — CEPHALEXIN 500 MG PO CAPS: 1000.0000 mg | ORAL_CAPSULE | Freq: Two times a day (BID) | ORAL | 0 refills | Status: DC

## 2021-05-14 MED ORDER — DOCUSATE SODIUM 100 MG PO CAPS: 100.0000 mg | ORAL_CAPSULE | Freq: Two times a day (BID) | ORAL | 0 refills | Status: DC

## 2021-05-14 NOTE — ED Provider Notes (Signed)
[facility]   CSN: [id number] Arrival date & time: 05/14/21  1422     History Chief Complaint  Patient presents with   Breast Mass    Deanna Myers is a 30 y.o. female.  HPI Patient has a painful right breast mass.  She reports she has noticed that over the past 2 days.  There is a firm and very tender area on the right breast.  She denies similar on the left.  She has not had any drainage or discharge from the nipple.  She reports it feels little tender under her arm as well.  Patient reports in addition to this she has been having problems with abdominal pain.  The abdominal pain has been longer over several weeks with sharp and cramping upper abdominal pain.  Patient reports decreased bowel movements and attempts at stool softeners and laxatives without relief of symptoms.  She has not been have vomiting but pain with eating.  No fevers chills.  No pain burning urgency with urination.  Sometimes mild vaginal discharge.  No unusual bleeding.    Past Medical History:  Diagnosis Date   ADHD (attention deficit hyperactivity disorder)    since childhood   Asthma    inhaler last used prior to pregnancy   BV (bacterial vaginosis)    Chlamydia    GERD (gastroesophageal reflux disease)    Gonorrhea    Hyperemesis    G3    Patient Active Problem List   Diagnosis Date Noted   Active labor at term 12/21/2015   UTI (urinary tract infection) 06/13/2015   Pyelonephritis affecting pregnancy in first trimester 06/12/2015   Folliculitis 02/13/2013    Past Surgical History:  Procedure Laterality Date   CESAREAN SECTION N/A 12/21/2015   Procedure: CESAREAN SECTION;  Surgeon: Huel CoteKathy Richardson, MD;  Location: [facility];  Service: Obstetrics;  Laterality: N/A;   NO PAST SURGERIES       OB History     Gravida  4   Para  2   Term  2   Preterm      AB  1   Living  2      SAB  1   IAB      Ectopic      Multiple  0   Live  Births  2           Family History  Problem Relation Age of Onset   Hypertension Father    Hypertension Mother    Kidney disease Mother    Cervical cancer Paternal Aunt    Congestive Heart Failure Maternal Grandmother    Breast cancer Paternal Grandfather     Social History   Tobacco Use   Smoking status: Never   Smokeless tobacco: Never  Substance Use Topics   Alcohol use: Yes    Comment: social   Drug use: No    Home Medications Prior to Admission medications   Medication Sig Start Date End Date Taking? Authorizing Provider  cephALEXin (KEFLEX) 500 MG capsule Take 2 capsules (1,000 mg total) by mouth 2 (two) times daily. 05/14/21  Yes Arby BarrettePfeiffer, Edeline Greening, MD  docusate sodium (COLACE) 100 MG capsule Take 1 capsule (100 mg total) by mouth every 12 (twelve) hours. 05/14/21  Yes Zakari Bathe, Lebron ConnersMarcy, MD  EPINEPHrine 0.3 mg/0.3 mL IJ SOAJ injection Inject 0.3 mg into the muscle once.    [provider]  lidocaine (LIDODERM) 5 % Place 1 patch onto the skin daily. Remove &  Discard patch within 12 hours or as directed by MD 12/16/20   Henderly, Britni A, PA-C  methocarbamol (ROBAXIN) 500 MG tablet Take 1 tablet (500 mg total) by mouth 2 (two) times daily. 12/16/20   Henderly, Britni A, PA-C  naproxen (NAPROSYN) 500 MG tablet Take 1 tablet (500 mg total) by mouth 2 (two) times daily. 12/16/20   Henderly, Britni A, PA-C  predniSONE (DELTASONE) 10 MG tablet Begin with 6 tabs on day 1, 5 tab on day 2, 4 tab on day 3, 3 tab on day 4, 2 tab on day 5, 1 tab on day 6-take with food 12/24/20   Wieters, Hallie C, PA-C  tiZANidine (ZANAFLEX) 4 MG tablet Take 0.5-1 tablets (2-4 mg total) by mouth every 6 (six) hours as needed for muscle spasms. 12/24/20   Wieters, Hallie C, PA-C  albuterol (PROVENTIL HFA;VENTOLIN HFA) 108 (90 BASE) MCG/ACT inhaler Inhale 1-2 puffs into the lungs every 6 (six) hours as needed for wheezing or shortness of breath.  05/21/20  [provider]  omeprazole (PRILOSEC)  20 MG capsule Take 1 capsule (20 mg total) by mouth 2 (two) times daily. Patient not taking: Reported on 02/10/2018 07/03/17 09/18/19  [name], MD  SUMAtriptan (IMITREX) 100 MG tablet Take 1 tablet earliest onset of migraine.  May repeat once in 2 hours if headache persists or recurs.  Do not exceed 2 tablets in 24 hours 02/10/18 05/21/20  [name], DO    Allergies    Shellfish allergy, Adhesive [tape], Latex, and Betadine [povidone iodine]  Review of Systems   Review of Systems Systems reviewed and negative except as per HPI Physical Exam Updated Vital Signs BP 111/87   Pulse 91   Temp 98.7 F (37.1 C)   Resp 18   Ht 5\' 6"  (1.676 m)   Wt 50.8 kg   LMP 05/05/2021   SpO2 99%   BMI 18.08 kg/m   Physical Exam Constitutional:      Comments: Alert nontoxic clear mental status.  No respiratory distress  HENT:     Mouth/Throat:     Pharynx: Oropharynx is clear.  Eyes:     Extraocular Movements: Extraocular movements intact.  Cardiovascular:     Rate and Rhythm: Normal rate and regular rhythm.  Pulmonary:     Effort: Pulmonary effort is normal.     Breath sounds: Normal breath sounds.     Comments: Right breast has a firm approximately 5 x 2 cm irregular mass within the lateral soft tissues of the breast.  This is somewhat mobile and tender.  Does not feel fluctuant.  No overlying induration or soft tissue changes.  No palpable axillary lymphadenopathy.  Left breast normal. Abdominal:     Comments: Bowel sounds normal.  Patient endorses significant pain to palpation right upper quadrant and epigastrium.  No guarding.  Lower abdomen mild discomfort.  No palpable mass in abdomen  Musculoskeletal:        General: No swelling or tenderness. Normal range of motion.     Right lower leg: No edema.     Left lower leg: No edema.  Skin:    General: Skin is warm and dry.  Neurological:     General: No focal deficit present.     Mental Status: She is oriented to person, place, and  time.     Motor: No weakness.     Coordination: Coordination normal.  Psychiatric:        Mood and Affect: Mood normal.  ED Results / Procedures / Treatments   Labs (all labs ordered are listed, but only abnormal results are displayed) Labs Reviewed  COMPREHENSIVE METABOLIC PANEL  LIPASE, BLOOD  CBC WITH DIFFERENTIAL/PLATELET  HCG, SERUM, QUALITATIVE  URINALYSIS, ROUTINE W REFLEX MICROSCOPIC    EKG None  Radiology CT CHEST ABDOMEN PELVIS W CONTRAST  Result Date: 05/14/2021 CLINICAL DATA:  Lump in right breast for 2 days EXAM: CT CHEST, ABDOMEN, AND PELVIS WITH CONTRAST TECHNIQUE: Multidetector CT imaging of the chest, abdomen and pelvis was performed following the standard protocol during bolus administration of intravenous contrast. CONTRAST:  OMNIPAQUE IOHEXOL 300 MG/ML  SOLN COMPARISON:  Chest radiograph 11/21/2010 FINDINGS: CT CHEST FINDINGS Cardiovascular: Normal heart size. No pericardial effusion. The aortic root is suboptimally assessed given cardiac pulsation artifact. The aorta is normal caliber. No acute luminal abnormality of the imaged aorta. No periaortic stranding or hemorrhage. Left vertebral artery arises directly from the aortic arch. Central pulmonary arteries are normal caliber. No large central filling defects. More distal evaluation limited by a non tailored examination. Mediastinum/Nodes: Wedge-shaped soft tissue across the anterior mediastinum. No mediastinal fluid or gas. Normal thyroid gland and thoracic inlet. No acute abnormality of the trachea or esophagus. No worrisome mediastinal, hilar or axillary adenopathy. Lungs/Pleura: Airways are patent. No consolidation, features of edema, pneumothorax, or effusion. No suspicious pulmonary nodules or masses. Musculoskeletal: Very minimal asymmetry of the breast tissue. No discrete focal soft tissue mass or lesion is seen. No overt skin thickening isn't readily apparent by CT imaging. Remaining chest wall soft  tissues are unremarkable. No acute fracture or traumatic osseous injury of the chest wall, spine or imaged shoulders. CT ABDOMEN PELVIS FINDINGS Hepatobiliary: No worrisome focal liver lesions. Smooth liver surface contour. Normal hepatic attenuation. Normal gallbladder and biliary tree. Pancreas: No pancreatic ductal dilatation or surrounding inflammatory changes. Spleen: Normal in size. No concerning splenic lesions. Adrenals/Urinary Tract: Normal adrenals. Kidneys are normally located with symmetric enhancement. No suspicious renal lesion, urolithiasis or hydronephrosis. Mild bladder wall thickening may be related to underdistention without other visible acute bladder abnormality such as calculi or debris. Stomach/Bowel: Distal esophagus, stomach and duodenal sweep are unremarkable. No small bowel wall thickening or dilatation. No evidence of obstruction. A normal appendix is visualized. No colonic dilatation or wall thickening. Moderate colonic stool burden Vascular/Lymphatic: No significant vascular findings are present. No enlarged abdominal or pelvic lymph nodes. Reproductive: Retroverted uterus. Thick-walled peripherally enhancing cystic structure in the right adnexa compatible with a collapsing corpus luteum. No concerning adnexal mass or lesion. A normal physiologic finding in a reproductive age female. Other: No abdominopelvic free fluid or free gas. No bowel containing hernias. Musculoskeletal: Musculature is normal and symmetric. No acute osseous abnormality or suspicious osseous lesion. IMPRESSION: No discrete skin thickening, mass or collection is seen within the right breast soft tissues. Some minimal asymmetry of the glandular tissue in the breast may be within physiologic normal. If there is persisting clinical concern, recommend referral to a dedicated outpatient breast imaging center for further evaluation and management. Moderate colonic stool burden. Could correlate for features of  constipation. Normal appendix. Collapsing corpus luteum in the right adnexa. No follow-up imaging recommended. Note: This recommendation does not apply to premenarchal patients and to those with increased risk (genetic, family history, elevated tumor markers or other high-risk factors) of ovarian cancer. Reference: JACR 2020 Feb; 17(2):248-254 Mild bladder wall thickening may be related to underdistention though could correlate for urinary symptoms and with urinalysis as appropriate. Wedge-shaped soft  tissue attenuation in the anterior mediastinum is most likely reflective of thymic remnant in a patient of this age given location, configuration, and absence of surrounding fat stranding or other adjacent traumatic features. Electronically Signed   By: Kreg Shropshire M.D.   On: 05/14/2021 20:22    Procedures Procedures   Medications Ordered in ED Medications  iohexol (OMNIPAQUE) 300 MG/ML solution 100 mL (100 mLs Intravenous Contrast Given 05/14/21 1944)    ED Course  I have reviewed the triage vital signs and the nursing notes.  Pertinent labs & imaging results that were available during my care of the patient were reviewed by me and considered in my medical decision making (see chart for details).    MDM Rules/Calculators/A&P                           Patient has a very tender area on the right breast.  There is no overlying cellulitis.  Clinically no palpable fluctuance.  Patient will be started on Keflex for breast tenderness with possible deeper infection.  CT scan obtained does not show any abscess or suspicious appearing mass lesion.  Patient is however highly counseled to follow-up with OB/GYN for repeat breast exam and breast imaging as indicated.  Patient also was experiencing abdominal pain.  CT does not show acute findings other than some potential constipation.  Recommends are made for management of constipation at home.  Patient is recommended to follow-up with a primary care physician  for continued monitoring of response to treatment and any further treatment if indicated.  Patient is discharged in good condition with stable vital signs. Final Clinical Impression(s) / ED Diagnoses Final diagnoses:  Painful lumpy right breast  Pain of upper abdomen  Constipation, unspecified constipation type    Rx / DC Orders ED Discharge Orders          Ordered    cephALEXin (KEFLEX) 500 MG capsule  2 times daily        05/14/21 2035    docusate sodium (COLACE) 100 MG capsule  Every 12 hours        05/14/21 2035             Arby Barrette, MD 05/14/21 2039

## 2021-05-14 NOTE — ED Triage Notes (Addendum)
Has multiple issues , lump in rt breast x 2 days  Area is tender ,and having abd pain , states maybe appendix  and is constipated

## 2021-05-14 NOTE — Discharge Instructions (Addendum)
1.  You have a painful area in your right breast.  You have been given an antibiotic to take for possible infection.  There is no sign of an abscess present at this time.  It is very Port[place] that you follow-up with your gynecologist to further evaluate this area and determine follow-up breast imaging. 2.  Your CT scan shows some constipation but no other significant abnormality in the abdomen.  At this time follow dietary recommendations for constipation.  Take Colace twice daily as prescribed.  You need to get established with a family physician to continue your evaluation and treatment for medical concerns.  A resource guide has been included in your discharge instructions with low-cost medical care options.

## 2021-05-18 DIAGNOSIS — N63 Unspecified lump in unspecified breast: Secondary | ICD-10-CM | POA: Insufficient documentation

## 2021-05-20 ENCOUNTER — Other Ambulatory Visit: Payer: Self-pay | Admitting: Obstetrics and Gynecology

## 2021-05-20 DIAGNOSIS — N63 Unspecified lump in unspecified breast: Secondary | ICD-10-CM

## 2021-06-09 ENCOUNTER — Other Ambulatory Visit: Payer: Medicaid Other

## 2021-06-16 ENCOUNTER — Encounter: Payer: Self-pay | Admitting: Neurology

## 2021-06-18 ENCOUNTER — Other Ambulatory Visit: Payer: Self-pay | Admitting: Obstetrics and Gynecology

## 2021-06-18 ENCOUNTER — Other Ambulatory Visit: Payer: Self-pay

## 2021-06-18 ENCOUNTER — Ambulatory Visit
Admission: RE | Admit: 2021-06-18 | Discharge: 2021-06-18 | Disposition: A | Discharge status: Home / Self Care | Payer: Medicaid Other | Source: Ambulatory Visit | Attending: Obstetrics and Gynecology | Admitting: Obstetrics and Gynecology

## 2021-06-18 DIAGNOSIS — N63 Unspecified lump in unspecified breast: Secondary | ICD-10-CM

## 2021-09-09 NOTE — Progress Notes (Signed)
NEUROLOGY CONSULTATION NOTE  Deanna Myers MRN: [id number] DOB: 12-17-1990  Referring provider: Payton Emerald, MD Primary care provider: Payton Emerald, MD  Reason for consult:  headaches  Assessment/Plan:   Migraine with aura, without status migrainosus, intractable  Migraine prevention:  start topiramate 25mg  at bedtime.  Increase to 50mg  at bedtime in 4 weeks if needed Migraine rescue:  Stop Advil.  Start rizatriptan 10mg  and Zofran 4mg  Limit use of pain relievers to no more than 2 days out of week to prevent risk of rebound or medication-overuse headache. Keep headache diary Follow up 7 months.    Subjective:  Deanna Myers is a 30 year old female with ADHD and asthma who presents for headaches.  History supplemented by referring provider's note.  Feels headaches getting worse. Onset:  2015, following her first pregnancy.  Infrequent until this past year. Location:  Varies (frontal, parietal, occipital) Quality:  Undulating, pulsating, stabbing Initial intensity:  severe.  She denies new headache, thunderclap headache or severe headache that wakes her from sleep. Aura:  Monocular vision loss/blurred vision in either eye Prodrome: no Postdrome:  no Associated symptoms:  Nausea, photophobia, phonophobia, sometimes vomiting, hypersensitivity, head/neck/upper torso aching  She denies associated unilateral numbness or weakness. Initial Duration:  4 days (3 days gradual buildup) Initial Frequency:  1 times a month Initial Frequency of abortive medication: as needed Triggers/aggravating factors:  Unknown Relieving factors:  Laying down in dark Activity:  aggravates  CT head on 04/25/2012 personally reviewed was normal.  Current NSAIDS:  Advil Migraine Current analgesics:  no Current triptans:  no Current ergotamine:  no Current anti-emetic:  no Current muscle relaxants:  no Current anti-anxiolytic:  no Current sleep aide:  no Current Antihypertensive  medications:  no Current Antidepressant medications:  no Current Anticonvulsant medications:  no Current anti-CGRP:  no Current Vitamins/Herbal/Supplements:  no Current Antihistamines/Decongestants:  hydroxyzine Other therapy:  no Other medication:  no   Past NSAIDS:  Ibuprofen, naproxen Past analgesics:  Excedrin, Tylenol, Extra-strength Tylenol Past abortive triptans:  sumatriptan 100mg  Past muscle relaxants:  Robaxin, tizanidine Past anti-emetic:  Zofran 4mg  Past antihypertensive medications:  no Past antidepressant medications:  no Past anticonvulsant medications:  no Past vitamins/Herbal/Supplements:  no Past antihistamines/decongestants:  no Other past therapies:  no    Caffeine:  Occasional soda or coffee Alcohol:  occasional Smoker:  no Diet:  Hydrates, Gatorade Exercise:  yes Depression:  no; Anxiety:  yes Other pain:  no Sleep hygiene:  good Family history of headache:  Mother had migraines        PAST MEDICAL HISTORY: Past Medical History:  Diagnosis Date   ADHD (attention deficit hyperactivity disorder)    since childhood   Asthma    inhaler last used prior to pregnancy   BV (bacterial vaginosis)    Chlamydia    GERD (gastroesophageal reflux disease)    Gonorrhea    Hyperemesis    G3    PAST SURGICAL HISTORY: Past Surgical History:  Procedure Laterality Date   CESAREAN SECTION N/A 12/21/2015   Procedure: CESAREAN SECTION;  Surgeon: Charlsie Quest, MD;  Location: [facility];  Service: Obstetrics;  Laterality: N/A;   NO PAST SURGERIES      MEDICATIONS: Current Outpatient Medications on File Prior to Visit  Medication Sig Dispense Refill   cephALEXin (KEFLEX) 500 MG capsule Take 2 capsules (1,000 mg total) by mouth 2 (two) times daily. 28 capsule 0   docusate sodium (COLACE) 100 MG capsule Take 1 capsule (100 mg  total) by mouth every 12 (twelve) hours. 60 capsule 0   EPINEPHrine 0.3 mg/0.3 mL IJ SOAJ injection Inject 0.3 mg into the muscle once.      lidocaine (LIDODERM) 5 % Place 1 patch onto the skin daily. Remove & Discard patch within 12 hours or as directed by MD 30 patch 0   methocarbamol (ROBAXIN) 500 MG tablet Take 1 tablet (500 mg total) by mouth 2 (two) times daily. 20 tablet 0   naproxen (NAPROSYN) 500 MG tablet Take 1 tablet (500 mg total) by mouth 2 (two) times daily. 30 tablet 0   predniSONE (DELTASONE) 10 MG tablet Begin with 6 tabs on day 1, 5 tab on day 2, 4 tab on day 3, 3 tab on day 4, 2 tab on day 5, 1 tab on day 6-take with food 21 tablet 0   tiZANidine (ZANAFLEX) 4 MG tablet Take 0.5-1 tablets (2-4 mg total) by mouth every 6 (six) hours as needed for muscle spasms. 30 tablet 0   [DISCONTINUED] albuterol (PROVENTIL HFA;VENTOLIN HFA) 108 (90 BASE) MCG/ACT inhaler Inhale 1-2 puffs into the lungs every 6 (six) hours as needed for wheezing or shortness of breath.     [DISCONTINUED] omeprazole (PRILOSEC) 20 MG capsule Take 1 capsule (20 mg total) by mouth 2 (two) times daily. (Patient not taking: Reported on 02/10/2018) 60 capsule 1   [DISCONTINUED] SUMAtriptan (IMITREX) 100 MG tablet Take 1 tablet earliest onset of migraine.  May repeat once in 2 hours if headache persists or recurs.  Do not exceed 2 tablets in 24 hours 10 tablet 2   No current facility-administered medications on file prior to visit.    ALLERGIES: Allergies  Allergen Reactions   Shellfish Allergy Anaphylaxis   Adhesive [Tape] Swelling and Other (See Comments)    Reaction:  Bruising    Latex Hives   Betadine [Povidone Iodine] Rash    FAMILY HISTORY: Family History  Problem Relation Age of Onset   Hypertension Father    Hypertension Mother    Kidney disease Mother    Cervical cancer Paternal Aunt    Congestive Heart Failure Maternal Grandmother    Breast cancer Paternal Grandfather     Objective:  Blood pressure 118/70, pulse 87, height 5\' 6"  (1.676 m), weight 220 lb 12.8 oz (100.2 kg), last menstrual period 10/03/2020, SpO2 98 %. General: No  acute distress.  Patient appears well-groomed.   Head:  Normocephalic/atraumatic Eyes:  fundi examined but not visualized Neck: supple, no paraspinal tenderness, full range of motion Back: No paraspinal tenderness Heart: regular rate and rhythm Lungs: Clear to auscultation bilaterally. Vascular: No carotid bruits. Neurological Exam: Mental status: alert and oriented to person, place, and time, recent and remote memory intact, fund of knowledge intact, attention and concentration intact, speech fluent and not dysarthric, language intact. Cranial nerves: CN I: not tested CN II: pupils equal, round and reactive to light, visual fields intact CN III, IV, VI:  full range of motion, no nystagmus, no ptosis CN V: facial sensation intact. CN VII: upper and lower face symmetric CN VIII: hearing intact CN IX, X: gag intact, uvula midline CN XI: sternocleidomastoid and trapezius muscles intact CN XII: tongue midline Bulk & Tone: normal, no fasciculations. Motor:  muscle strength 5/5 throughout Sensation:  Pinprick, temperature and vibratory sensation intact. Deep Tendon Reflexes:  2+ throughout,  toes downgoing.   Finger to nose testing:  Without dysmetria.   Heel to shin:  Without dysmetria.   Gait:  Normal station and  stride.  Romberg negative.    Thank you for allowing me to take part in the care of this patient.  Shon Millet, DO

## 2021-09-10 ENCOUNTER — Encounter: Payer: Self-pay | Admitting: Neurology

## 2021-09-10 ENCOUNTER — Ambulatory Visit: Payer: Medicaid Other | Admitting: Neurology

## 2021-09-10 ENCOUNTER — Other Ambulatory Visit: Payer: Self-pay

## 2021-09-10 VITALS — BP 118/70 | HR 87 | Ht 66.0 in | Wt 220.8 lb

## 2021-09-10 DIAGNOSIS — G43119 Migraine with aura, intractable, without status migrainosus: Secondary | ICD-10-CM

## 2021-09-10 MED ORDER — ONDANSETRON HCL 4 MG PO TABS: 4.0000 mg | ORAL_TABLET | Freq: Three times a day (TID) | ORAL | 5 refills | Status: DC | PRN

## 2021-09-10 MED ORDER — TOPIRAMATE 25 MG PO TABS: 25.0000 mg | ORAL_TABLET | Freq: Every day | ORAL | 5 refills | Status: DC

## 2021-09-10 MED ORDER — RIZATRIPTAN BENZOATE 10 MG PO TABS: ORAL_TABLET | ORAL | 5 refills | Status: DC

## 2021-09-10 NOTE — Patient Instructions (Signed)
  Start topiramate 25mg  at bedtime.  Contact in 4 weeks with update and we can increase dose if needed. Take rizatriptan 10mg  at earliest onset of headache.  May repeat dose once in 2 hours if needed.  Maximum 2 tablets in 24 hours. Take ondansetron for nausea Limit use of pain relievers to no more than 2 days out of the week.  These medications include acetaminophen, NSAIDs (ibuprofen/Advil/Motrin, naproxen/Aleve, triptans (Imitrex/sumatriptan), Excedrin, and narcotics.  This will help reduce risk of rebound headaches. Be aware of common food triggers:  - Caffeine:  coffee, black tea, cola, Mt. Dew  - Chocolate  - Dairy:  aged cheeses (brie, blue, cheddar, gouda, Superior, provolone, Paris, Swiss, etc), chocolate milk, buttermilk, sour cream, limit eggs and yogurt  - Nuts, peanut butter  - Alcohol  - Cereals/grains:  FRESH breads (fresh bagels, sourdough, doughnuts), yeast productions  - Processed/canned/aged/cured meats (pre-packaged deli meats, hotdogs)  - MSG/glutamate:  soy sauce, flavor enhancer, pickled/preserved/marinated foods  - Sweeteners:  aspartame (Equal, Nutrasweet).  Sugar and Splenda are okay  - Vegetables:  legumes (lima beans, lentils, snow peas, fava beans, pinto peans, peas, garbanzo beans), sauerkraut, onions, olives, pickles  - Fruit:  avocados, bananas, citrus fruit (orange, lemon, grapefruit), mango  - Other:  Frozen meals, macaroni and cheese Routine exercise Stay adequately hydrated (aim for 64 oz water daily) Keep headache diary Maintain proper stress management Maintain proper sleep hygiene Do not skip meals Consider supplements:  magnesium citrate 400mg  daily, riboflavin 400mg  daily, coenzyme Q10 100mg  three times daily.

## 2021-10-04 ENCOUNTER — Other Ambulatory Visit: Payer: Self-pay

## 2021-10-04 ENCOUNTER — Ambulatory Visit
Admission: EM | Admit: 2021-10-04 | Discharge: 2021-10-04 | Disposition: A | Discharge status: Home / Self Care | Payer: Medicaid Other | Attending: Internal Medicine | Admitting: Internal Medicine

## 2021-10-04 DIAGNOSIS — L02411 Cutaneous abscess of right axilla: Secondary | ICD-10-CM | POA: Diagnosis not present

## 2021-10-04 DIAGNOSIS — J020 Streptococcal pharyngitis: Secondary | ICD-10-CM

## 2021-10-04 DIAGNOSIS — H65192 Other acute nonsuppurative otitis media, left ear: Secondary | ICD-10-CM

## 2021-10-04 LAB — POCT RAPID STREP A (OFFICE): Rapid Strep A Screen: POSITIVE — AB

## 2021-10-04 MED ORDER — CEPHALEXIN 500 MG PO CAPS: 500.0000 mg | ORAL_CAPSULE | Freq: Four times a day (QID) | ORAL | 0 refills | Status: DC

## 2021-10-04 NOTE — ED Triage Notes (Signed)
Pt st she is concerned she may have strep. Pt st she has a boil under her arm for the past month and the last couple of days she has began to have sore throat and difficulty talking.

## 2021-10-04 NOTE — Discharge Instructions (Signed)
You have an ear infection and strep throat which is being treated with cephalexin antibiotic.  You also have an abscess to your armpit which is also being treated with the same antibiotic.  Please use warm compresses to ear armpit.  Follow-up if symptoms persist or worsen.

## 2021-10-04 NOTE — ED Provider Notes (Signed)
[facility]    CSN: [id number] Arrival date & time: 10/04/21  1058      History   Chief Complaint Chief Complaint  Patient presents with   Sore Throat   Rash    HPI Deanna Myers is a 31 y.o. female.   Patient presents with sore throat that has been present for 3 days.  Denies any upper respiratory symptoms or known fevers.  Denies any known sick contacts.  Denies chest pain, shortness of breath, difficulty swallowing sputum, ear pain, nausea, vomiting, diarrhea, abdominal pain.  Patient also reports that she has a boil under her right armpit that has been present for approximately 1 month.  Denies any drainage from the area.  Patient reports that she does have history of the same but typically they are located on her buttocks region.   Sore Throat  Rash  Past Medical History:  Diagnosis Date   ADHD (attention deficit hyperactivity disorder)    since childhood   Asthma    inhaler last used prior to pregnancy   BV (bacterial vaginosis)    Chlamydia    GERD (gastroesophageal reflux disease)    Gonorrhea    Hyperemesis    G3    Patient Active Problem List   Diagnosis Date Noted   Active labor at term 12/21/2015   UTI (urinary tract infection) 06/13/2015   Pyelonephritis affecting pregnancy in first trimester [id number]   Folliculitis [id number]    Past Surgical History:  Procedure Laterality Date   CESAREAN SECTION N/A 12/21/2015   Procedure: CESAREAN SECTION;  Surgeon: Paula Compton, MD;  Location: [facility];  Service: Obstetrics;  Laterality: N/A;   NO PAST SURGERIES      OB History     Gravida  4   Para  2   Term  2   Preterm      AB  1   Living  2      SAB  1   IAB      Ectopic      Multiple  0   Live Births  2            Home Medications    Prior to Admission medications   Medication Sig Start Date End Date Taking? Authorizing Provider  cephALEXin (KEFLEX) 500 MG capsule Take 1 capsule (500 mg total) by  mouth 4 (four) times daily. 10/04/21  Yes Jackalyn Haith, Hildred Alamin E, FNP  methocarbamol (ROBAXIN) 500 MG tablet Take 1 tablet (500 mg total) by mouth 2 (two) times daily. Patient not taking: Reported on 09/10/2021 12/16/20   Henderly, Britni A, PA-C  naproxen (NAPROSYN) 500 MG tablet Take 1 tablet (500 mg total) by mouth 2 (two) times daily. Patient not taking: Reported on 09/10/2021 12/16/20   Henderly, Britni A, PA-C  ondansetron (ZOFRAN) 4 MG tablet Take 1 tablet (4 mg total) by mouth every 8 (eight) hours as needed for nausea or vomiting. 09/10/21   Pieter Partridge, DO  predniSONE (DELTASONE) 10 MG tablet Begin with 6 tabs on day 1, 5 tab on day 2, 4 tab on day 3, 3 tab on day 4, 2 tab on day 5, 1 tab on day 6-take with food 12/24/20   Wieters, [employer] C, PA-C  rizatriptan (MAXALT) 10 MG tablet Take 1 tablet earliest onset of headache.  May repeat in 2 hours if needed.  Maximum 2 tablets in 24 hours. 09/10/21   Pieter Partridge, DO  tiZANidine (ZANAFLEX) 4 MG tablet Take  0.5-1 tablets (2-4 mg total) by mouth every 6 (six) hours as needed for muscle spasms. Patient not taking: Reported on 09/10/2021 12/24/20   Wieters, Hallie C, PA-C  topiramate (TOPAMAX) 25 MG tablet Take 1 tablet (25 mg total) by mouth at bedtime. 09/10/21   Tomi Likens, Adam R, DO  albuterol (PROVENTIL HFA;VENTOLIN HFA) 108 (90 BASE) MCG/ACT inhaler Inhale 1-2 puffs into the lungs every 6 (six) hours as needed for wheezing or shortness of breath.  05/21/20  [provider]  omeprazole (PRILOSEC) 20 MG capsule Take 1 capsule (20 mg total) by mouth 2 (two) times daily. Patient not taking: Reported on 02/10/2018 07/03/17 09/18/19  Tanna Furry, MD  SUMAtriptan (IMITREX) 100 MG tablet Take 1 tablet earliest onset of migraine.  May repeat once in 2 hours if headache persists or recurs.  Do not exceed 2 tablets in 24 hours 02/10/18 05/21/20  Pieter Partridge, DO    Family History Family History  Problem Relation Age of Onset   Hypertension Father    Hypertension  Mother    Kidney disease Mother    Cervical cancer Paternal Aunt    Congestive Heart Failure Maternal Grandmother    Breast cancer Paternal Grandfather     Social History Social History   Tobacco Use   Smoking status: Never   Smokeless tobacco: Never  Substance Use Topics   Alcohol use: Yes    Comment: social   Drug use: No     Allergies   Shellfish allergy, Adhesive [tape], Latex, and Betadine [povidone iodine]   Review of Systems Review of Systems Per HPI  Physical Exam Triage Vital Signs ED Triage Vitals  Enc Vitals Group     BP 10/04/21 1218 112/75     Pulse Rate 10/04/21 1218 91     Resp 10/04/21 1218 15     Temp 10/04/21 1218 98.5 F (36.9 C)     Temp Source 10/04/21 1218 Oral     SpO2 10/04/21 1218 98 %     Weight 10/04/21 1216 220 lb (99.8 kg)     Height 10/04/21 1216 5\' 6"  (1.676 m)     Head Circumference --      Peak Flow --      Pain Score 10/04/21 1216 8     Pain Loc --      Pain Edu? --      Excl. in [place]? --    No data found.  Updated Vital Signs BP 112/75 (BP Location: Right Arm)    Pulse 91    Temp 98.5 F (36.9 C) (Oral)    Resp 15    Ht 5\' 6"  (1.676 m)    Wt 220 lb (99.8 kg)    LMP 10/04/2021 (Exact Date)    SpO2 98%    BMI 35.51 kg/m   Visual Acuity Right Eye Distance:   Left Eye Distance:   Bilateral Distance:    Right Eye Near:   Left Eye Near:    Bilateral Near:     Physical Exam Constitutional:      Appearance: Normal appearance.  HENT:     Head: Normocephalic.     Right Ear: Tympanic membrane and ear canal normal.     Left Ear: Ear canal normal. Tympanic membrane is erythematous. Tympanic membrane is not perforated or bulging.     Nose: Nose normal.     Mouth/Throat:     Lips: Pink.     Mouth: Mucous membranes are moist.  Pharynx: Posterior oropharyngeal erythema present. No pharyngeal swelling, oropharyngeal exudate or uvula swelling.     Tonsils: No tonsillar exudate or tonsillar abscesses.  Eyes:     Extraocular  Movements: Extraocular movements intact.     Conjunctiva/sclera: Conjunctivae normal.     Pupils: Pupils are equal, round, and reactive to light.  Cardiovascular:     Rate and Rhythm: Normal rate and regular rhythm.  Pulmonary:     Effort: Pulmonary effort is normal. No respiratory distress.     Breath sounds: Normal breath sounds.  Skin:    General: Skin is warm and dry.     Comments: Patient has approximately 3-3.5 centimeter indurated abscess present to right axilla.  No drainage noted at this time.  Neurological:     General: No focal deficit present.     Mental Status: She is alert and oriented to person, place, and time.     UC Treatments / Results  Labs (all labs ordered are listed, but only abnormal results are displayed) Labs Reviewed  POCT RAPID STREP A (OFFICE) - Abnormal; Notable for the following components:      Result Value   Rapid Strep A Screen Positive (*)    All other components within normal limits    EKG   Radiology No results found.  Procedures Procedures (including critical care time)  Medications Ordered in UC Medications - No data to display  Initial Impression / Assessment and Plan / UC Course  I have reviewed the triage vital signs and the nursing notes.  Pertinent labs & imaging results that were available during my care of the patient were reviewed by me and considered in my medical decision making (see chart for details).     Rapid strep test was positive.  Patient also has left ear infection.  Patient does have abscess to right axilla.  I&D is not warranted at this time due to appearance of abscess on exam and its induration.  Will treat with cephalexin as cephalexin should cover for all 3 etiologies.  Patient also to use warm compresses to right axilla.  Advised patient to follow-up if symptoms do not improve or if they worsen.  No signs of peritonsillar abscess on exam.  Discussed strict return precautions.  Patient verbalized  understanding and was agreeable with plan. Final Clinical Impressions(s) / UC Diagnoses   Final diagnoses:  Strep pharyngitis  Other non-recurrent acute nonsuppurative otitis media of left ear  Abscess of right axilla     Discharge Instructions      You have an ear infection and strep throat which is being treated with cephalexin antibiotic.  You also have an abscess to your armpit which is also being treated with the same antibiotic.  Please use warm compresses to ear armpit.  Follow-up if symptoms persist or worsen.    ED Prescriptions     Medication Sig Dispense Auth. Provider   cephALEXin (KEFLEX) 500 MG capsule Take 1 capsule (500 mg total) by mouth 4 (four) times daily. 28 capsule [place], Michele Rockers, [place]      PDMP not reviewed this encounter.   Teodora Medici, [place] 10/04/21 1304

## 2021-12-20 ENCOUNTER — Encounter (HOSPITAL_BASED_OUTPATIENT_CLINIC_OR_DEPARTMENT_OTHER): Payer: Self-pay

## 2021-12-20 ENCOUNTER — Other Ambulatory Visit: Payer: Self-pay

## 2021-12-20 ENCOUNTER — Ambulatory Visit (HOSPITAL_BASED_OUTPATIENT_CLINIC_OR_DEPARTMENT_OTHER)
Admission: EM | Admit: 2021-12-20 | Discharge: 2021-12-21 | Disposition: A | Discharge status: Home / Self Care | Payer: Medicaid Other | Attending: Emergency Medicine | Admitting: Emergency Medicine

## 2021-12-20 DIAGNOSIS — K353 Acute appendicitis with localized peritonitis, without perforation or gangrene: Secondary | ICD-10-CM | POA: Diagnosis present

## 2021-12-20 DIAGNOSIS — E669 Obesity, unspecified: Secondary | ICD-10-CM | POA: Insufficient documentation

## 2021-12-20 DIAGNOSIS — K219 Gastro-esophageal reflux disease without esophagitis: Secondary | ICD-10-CM | POA: Diagnosis not present

## 2021-12-20 DIAGNOSIS — Z6836 Body mass index (BMI) 36.0-36.9, adult: Secondary | ICD-10-CM | POA: Insufficient documentation

## 2021-12-20 DIAGNOSIS — N83209 Unspecified ovarian cyst, unspecified side: Secondary | ICD-10-CM | POA: Diagnosis not present

## 2021-12-20 DIAGNOSIS — J45909 Unspecified asthma, uncomplicated: Secondary | ICD-10-CM | POA: Insufficient documentation

## 2021-12-20 DIAGNOSIS — Z20822 Contact with and (suspected) exposure to covid-19: Secondary | ICD-10-CM | POA: Diagnosis not present

## 2021-12-20 LAB — COMPREHENSIVE METABOLIC PANEL
ALT: 13 U/L (ref 0–44)
AST: 15 U/L (ref 15–41)
Albumin: 4.1 g/dL (ref 3.5–5.0)
Alkaline Phosphatase: 44 U/L (ref 38–126)
Anion gap: 7 (ref 5–15)
BUN: 12 mg/dL (ref 6–20)
CO2: 29 mmol/L (ref 22–32)
Calcium: 9.7 mg/dL (ref 8.9–10.3)
Chloride: 103 mmol/L (ref 98–111)
Creatinine, Ser: 0.65 mg/dL (ref 0.44–1.00)
GFR, Estimated: >60 mL/min (ref >60)
Glucose, Bld: 86 mg/dL (ref 70–99)
Potassium: 3.6 mmol/L (ref 3.5–5.1)
Sodium: 139 mmol/L (ref 135–145)
Total Bilirubin: 0.2 mg/dL — ABNORMAL LOW (ref 0.3–1.2)
Total Protein: 7.7 g/dL (ref 6.5–8.1)

## 2021-12-20 LAB — CBC
HCT: 37.3 % (ref 36.0–46.0)
Hemoglobin: 12.1 g/dL (ref 12.0–15.0)
MCH: 26.9 pg (ref 26.0–34.0)
MCHC: 32.4 g/dL (ref 30.0–36.0)
MCV: 83.1 fL (ref 80.0–100.0)
Platelets: 379 10*3/uL (ref 150–400)
RBC: 4.49 MIL/uL (ref 3.87–5.11)
RDW: 13.1 % (ref 11.5–15.5)
WBC: 7.8 10*3/uL (ref 4.0–10.5)
nRBC: 0 % (ref 0.0–0.2)

## 2021-12-20 LAB — LIPASE, BLOOD: Lipase: 16 U/L (ref 11–51)

## 2021-12-20 NOTE — ED Triage Notes (Signed)
Pt presents to the ED with LLQ abdominal pain that started last night. Denies N/V/D. Pt A&Ox4 at time of triage. VSS. Last took ibuprofen this morning and reports no relief.  ?

## 2021-12-21 ENCOUNTER — Emergency Department (HOSPITAL_COMMUNITY): Payer: Medicaid Other | Admitting: Anesthesiology

## 2021-12-21 ENCOUNTER — Other Ambulatory Visit: Payer: Self-pay

## 2021-12-21 ENCOUNTER — Emergency Department (HOSPITAL_BASED_OUTPATIENT_CLINIC_OR_DEPARTMENT_OTHER): Payer: Medicaid Other

## 2021-12-21 ENCOUNTER — Encounter (HOSPITAL_COMMUNITY)
Admission: EM | Disposition: A | Discharge status: Home / Self Care | Payer: Self-pay | Source: Home / Self Care | Attending: Emergency Medicine

## 2021-12-21 ENCOUNTER — Emergency Department (HOSPITAL_BASED_OUTPATIENT_CLINIC_OR_DEPARTMENT_OTHER): Payer: Medicaid Other | Admitting: Anesthesiology

## 2021-12-21 ENCOUNTER — Encounter (HOSPITAL_COMMUNITY): Payer: Self-pay | Admitting: Emergency Medicine

## 2021-12-21 DIAGNOSIS — J45909 Unspecified asthma, uncomplicated: Secondary | ICD-10-CM | POA: Diagnosis not present

## 2021-12-21 DIAGNOSIS — K353 Acute appendicitis with localized peritonitis, without perforation or gangrene: Secondary | ICD-10-CM | POA: Diagnosis present

## 2021-12-21 DIAGNOSIS — E669 Obesity, unspecified: Secondary | ICD-10-CM | POA: Diagnosis not present

## 2021-12-21 DIAGNOSIS — K3580 Unspecified acute appendicitis: Secondary | ICD-10-CM

## 2021-12-21 DIAGNOSIS — Z20822 Contact with and (suspected) exposure to covid-19: Secondary | ICD-10-CM | POA: Diagnosis not present

## 2021-12-21 DIAGNOSIS — N83209 Unspecified ovarian cyst, unspecified side: Secondary | ICD-10-CM | POA: Diagnosis not present

## 2021-12-21 DIAGNOSIS — K219 Gastro-esophageal reflux disease without esophagitis: Secondary | ICD-10-CM | POA: Diagnosis not present

## 2021-12-21 DIAGNOSIS — Z6836 Body mass index (BMI) 36.0-36.9, adult: Secondary | ICD-10-CM | POA: Diagnosis not present

## 2021-12-21 HISTORY — PX: LAPAROSCOPIC APPENDECTOMY: SHX408

## 2021-12-21 LAB — HCG, SERUM, QUALITATIVE: Preg, Serum: NEGATIVE

## 2021-12-21 LAB — RESP PANEL BY RT-PCR (FLU A&B, COVID) ARPGX2
Influenza A by PCR: NEGATIVE
Influenza B by PCR: NEGATIVE
SARS Coronavirus 2 by RT PCR: NEGATIVE

## 2021-12-21 SURGERY — APPENDECTOMY, LAPAROSCOPIC
Anesthesia: General | Site: Abdomen

## 2021-12-21 MED ORDER — IOHEXOL 300 MG/ML  SOLN
100.0000 mL | Freq: Once | INTRAMUSCULAR | Status: AC | PRN
  Administered 2021-12-21: 100 mL via INTRAVENOUS

## 2021-12-21 MED ORDER — ROCURONIUM BROMIDE 10 MG/ML (PF) SYRINGE
PREFILLED_SYRINGE | INTRAVENOUS | Status: DC | PRN
  Administered 2021-12-21: 60 mg via INTRAVENOUS

## 2021-12-21 MED ORDER — LIDOCAINE 2% (20 MG/ML) 5 ML SYRINGE
INTRAMUSCULAR | Status: DC | PRN
  Administered 2021-12-21: 80 mg via INTRAVENOUS

## 2021-12-21 MED ORDER — PIPERACILLIN-TAZOBACTAM 3.375 G IVPB 30 MIN
3.3750 g | Freq: Once | INTRAVENOUS | Status: AC
  Administered 2021-12-21: 3.375 g via INTRAVENOUS
  Filled 2021-12-21: qty 50

## 2021-12-21 MED ORDER — AMISULPRIDE (ANTIEMETIC) 5 MG/2ML IV SOLN
10.0000 mg | Freq: Once | INTRAVENOUS | Status: AC
  Administered 2021-12-21: 10 mg via INTRAVENOUS

## 2021-12-21 MED ORDER — ACETAMINOPHEN 10 MG/ML IV SOLN
INTRAVENOUS | Status: AC
  Filled 2021-12-21: qty 100

## 2021-12-21 MED ORDER — FENTANYL CITRATE (PF) 100 MCG/2ML IJ SOLN
25.0000 ug | INTRAMUSCULAR | Status: DC | PRN
  Administered 2021-12-21: 25 ug via INTRAVENOUS
  Administered 2021-12-21: 50 ug via INTRAVENOUS
  Administered 2021-12-21: 25 ug via INTRAVENOUS
  Administered 2021-12-21: 50 ug via INTRAVENOUS

## 2021-12-21 MED ORDER — FENTANYL CITRATE (PF) 250 MCG/5ML IJ SOLN
INTRAMUSCULAR | Status: DC | PRN
  Administered 2021-12-21 (×2): 25 ug via INTRAVENOUS
  Administered 2021-12-21: 100 ug via INTRAVENOUS

## 2021-12-21 MED ORDER — MIDAZOLAM HCL 2 MG/2ML IJ SOLN
INTRAMUSCULAR | Status: AC
  Filled 2021-12-21: qty 2

## 2021-12-21 MED ORDER — LACTATED RINGERS IV SOLN: INTRAVENOUS | Status: DC

## 2021-12-21 MED ORDER — GLYCOPYRROLATE PF 0.2 MG/ML IJ SOSY
PREFILLED_SYRINGE | INTRAMUSCULAR | Status: AC
  Filled 2021-12-21: qty 4

## 2021-12-21 MED ORDER — ONDANSETRON HCL 4 MG/2ML IJ SOLN
INTRAMUSCULAR | Status: DC | PRN
Start: 2021-12-21 — End: 2021-12-21
  Administered 2021-12-21: 4 mg via INTRAVENOUS

## 2021-12-21 MED ORDER — MORPHINE SULFATE (PF) 4 MG/ML IV SOLN
4.0000 mg | Freq: Once | INTRAVENOUS | Status: AC
  Administered 2021-12-21: 4 mg via INTRAVENOUS
  Filled 2021-12-21: qty 1

## 2021-12-21 MED ORDER — SODIUM CHLORIDE 0.9 % IR SOLN
Status: DC | PRN
Start: 2021-12-21 — End: 2021-12-21
  Administered 2021-12-21: 1000 mL

## 2021-12-21 MED ORDER — FENTANYL CITRATE (PF) 100 MCG/2ML IJ SOLN
INTRAMUSCULAR | Status: AC
  Filled 2021-12-21: qty 2

## 2021-12-21 MED ORDER — FENTANYL CITRATE (PF) 100 MCG/2ML IJ SOLN
50.0000 ug | Freq: Once | INTRAMUSCULAR | Status: AC
  Administered 2021-12-21: 50 ug via INTRAVENOUS
  Filled 2021-12-21: qty 2

## 2021-12-21 MED ORDER — BUPIVACAINE-EPINEPHRINE (PF) 0.25% -1:200000 IJ SOLN
INTRAMUSCULAR | Status: AC
  Filled 2021-12-21: qty 30

## 2021-12-21 MED ORDER — LIDOCAINE 2% (20 MG/ML) 5 ML SYRINGE
INTRAMUSCULAR | Status: AC
  Filled 2021-12-21: qty 5

## 2021-12-21 MED ORDER — HYDROMORPHONE HCL 1 MG/ML IJ SOLN
1.0000 mg | Freq: Once | INTRAMUSCULAR | Status: AC
Start: 2021-12-21 — End: 2021-12-21
  Administered 2021-12-21: 1 mg via INTRAVENOUS
  Filled 2021-12-21: qty 1

## 2021-12-21 MED ORDER — 0.9 % SODIUM CHLORIDE (POUR BTL) OPTIME
TOPICAL | Status: DC | PRN
  Administered 2021-12-21: 1000 mL

## 2021-12-21 MED ORDER — OXYCODONE HCL 5 MG PO TABS: 5.0000 mg | ORAL_TABLET | Freq: Once | ORAL | Status: DC

## 2021-12-21 MED ORDER — SUGAMMADEX SODIUM 200 MG/2ML IV SOLN
INTRAVENOUS | Status: DC | PRN
  Administered 2021-12-21: 250 mg via INTRAVENOUS

## 2021-12-21 MED ORDER — BUPIVACAINE-EPINEPHRINE 0.25% -1:200000 IJ SOLN
INTRAMUSCULAR | Status: DC | PRN
Start: 2021-12-21 — End: 2021-12-21
  Administered 2021-12-21: 7 mL

## 2021-12-21 MED ORDER — FENTANYL CITRATE (PF) 250 MCG/5ML IJ SOLN
INTRAMUSCULAR | Status: AC
  Filled 2021-12-21: qty 5

## 2021-12-21 MED ORDER — ACETAMINOPHEN 10 MG/ML IV SOLN
INTRAVENOUS | Status: DC | PRN
  Administered 2021-12-21: 1000 mg via INTRAVENOUS

## 2021-12-21 MED ORDER — HYDROMORPHONE HCL 1 MG/ML IJ SOLN
1.0000 mg | Freq: Once | INTRAMUSCULAR | Status: AC
  Administered 2021-12-21: 1 mg via INTRAVENOUS
  Filled 2021-12-21: qty 1

## 2021-12-21 MED ORDER — PROPOFOL 10 MG/ML IV BOLUS
INTRAVENOUS | Status: AC
  Filled 2021-12-21: qty 20

## 2021-12-21 MED ORDER — AMISULPRIDE (ANTIEMETIC) 5 MG/2ML IV SOLN
INTRAVENOUS | Status: AC
  Filled 2021-12-21: qty 4

## 2021-12-21 MED ORDER — OXYCODONE HCL 5 MG PO TABS: 5.0000 mg | ORAL_TABLET | Freq: Four times a day (QID) | ORAL | 0 refills | Status: DC | PRN

## 2021-12-21 MED ORDER — DEXAMETHASONE SODIUM PHOSPHATE 10 MG/ML IJ SOLN
INTRAMUSCULAR | Status: AC
  Filled 2021-12-21: qty 1

## 2021-12-21 MED ORDER — MIDAZOLAM HCL 2 MG/2ML IJ SOLN
INTRAMUSCULAR | Status: DC | PRN
  Administered 2021-12-21: 2 mg via INTRAVENOUS

## 2021-12-21 MED ORDER — DEXAMETHASONE SODIUM PHOSPHATE 10 MG/ML IJ SOLN
INTRAMUSCULAR | Status: DC | PRN
  Administered 2021-12-21: 10 mg via INTRAVENOUS

## 2021-12-21 MED ORDER — OXYCODONE HCL 5 MG PO TABS
ORAL_TABLET | ORAL | Status: AC
  Administered 2021-12-21: 5 mg
  Filled 2021-12-21: qty 1

## 2021-12-21 MED ORDER — NEOSTIGMINE METHYLSULFATE 3 MG/3ML IV SOSY
PREFILLED_SYRINGE | INTRAVENOUS | Status: AC
  Filled 2021-12-21: qty 6

## 2021-12-21 MED ORDER — ORAL CARE MOUTH RINSE: 15.0000 mL | Freq: Once | OROMUCOSAL | Status: AC

## 2021-12-21 MED ORDER — PIPERACILLIN-TAZOBACTAM 3.375 G IVPB 30 MIN
3.3750 g | Freq: Once | INTRAVENOUS | Status: AC
Start: 2021-12-21 — End: 2021-12-21
  Administered 2021-12-21: 3.375 g via INTRAVENOUS
  Filled 2021-12-21: qty 50

## 2021-12-21 MED ORDER — ONDANSETRON HCL 4 MG/2ML IJ SOLN
INTRAMUSCULAR | Status: AC
  Filled 2021-12-21: qty 2

## 2021-12-21 MED ORDER — BUPIVACAINE HCL (PF) 0.25 % IJ SOLN
INTRAMUSCULAR | Status: AC
  Filled 2021-12-21: qty 30

## 2021-12-21 MED ORDER — ONDANSETRON HCL 4 MG/2ML IJ SOLN
4.0000 mg | Freq: Once | INTRAMUSCULAR | Status: AC
  Administered 2021-12-21: 4 mg via INTRAVENOUS
  Filled 2021-12-21: qty 2

## 2021-12-21 MED ORDER — ONDANSETRON HCL 4 MG/2ML IJ SOLN
4.0000 mg | Freq: Once | INTRAMUSCULAR | Status: AC | PRN
  Administered 2021-12-21: 4 mg via INTRAVENOUS

## 2021-12-21 MED ORDER — PROCHLORPERAZINE EDISYLATE 10 MG/2ML IJ SOLN
10.0000 mg | Freq: Once | INTRAMUSCULAR | Status: AC
  Administered 2021-12-21: 10 mg via INTRAVENOUS
  Filled 2021-12-21: qty 2

## 2021-12-21 MED ORDER — PROPOFOL 10 MG/ML IV BOLUS
INTRAVENOUS | Status: DC | PRN
  Administered 2021-12-21: 140 mg via INTRAVENOUS

## 2021-12-21 MED ORDER — CHLORHEXIDINE GLUCONATE 0.12 % MT SOLN: 15.0000 mL | Freq: Once | OROMUCOSAL | Status: AC

## 2021-12-21 MED ORDER — ROCURONIUM BROMIDE 10 MG/ML (PF) SYRINGE
PREFILLED_SYRINGE | INTRAVENOUS | Status: AC
  Filled 2021-12-21: qty 10

## 2021-12-21 MED ORDER — CHLORHEXIDINE GLUCONATE 0.12 % MT SOLN
OROMUCOSAL | Status: AC
  Administered 2021-12-21: 15 mL via OROMUCOSAL
  Filled 2021-12-21: qty 15

## 2021-12-21 MED ORDER — ESMOLOL HCL 100 MG/10ML IV SOLN
INTRAVENOUS | Status: DC | PRN
  Administered 2021-12-21 (×2): 20 mg via INTRAVENOUS

## 2021-12-21 SURGICAL SUPPLY — 48 items
APPLIER CLIP 5 13 M/L LIGAMAX5 (MISCELLANEOUS)
BAG COUNTER SPONGE SURGICOUNT (BAG) ×2 IMPLANT
BAG RETRIEVAL 10 (BASKET) ×1
BLADE CLIPPER SURG (BLADE) IMPLANT
CANISTER SUCT 3000ML PPV (MISCELLANEOUS) ×2 IMPLANT
CHLORAPREP W/TINT 26 (MISCELLANEOUS) ×2 IMPLANT
CLIP APPLIE 5 13 M/L LIGAMAX5 (MISCELLANEOUS) IMPLANT
COVER SURGICAL LIGHT HANDLE (MISCELLANEOUS) ×2 IMPLANT
CUTTER FLEX LINEAR 45M (STAPLE) ×2 IMPLANT
DERMABOND ADVANCED (GAUZE/BANDAGES/DRESSINGS) ×1
DERMABOND ADVANCED .7 DNX12 (GAUZE/BANDAGES/DRESSINGS) ×1 IMPLANT
ELECT REM PT RETURN 9FT ADLT (ELECTROSURGICAL) ×2
ELECTRODE REM PT RTRN 9FT ADLT (ELECTROSURGICAL) ×1 IMPLANT
GLOVE SURG ENC MOIS LTX SZ7.5 (GLOVE) ×2 IMPLANT
GLOVE SURG UNDER LTX SZ8 (GLOVE) ×2 IMPLANT
GOWN STRL REUS W/ TWL LRG LVL3 (GOWN DISPOSABLE) ×2 IMPLANT
GOWN STRL REUS W/ TWL XL LVL3 (GOWN DISPOSABLE) ×1 IMPLANT
GOWN STRL REUS W/TWL LRG LVL3 (GOWN DISPOSABLE) ×4
GOWN STRL REUS W/TWL XL LVL3 (GOWN DISPOSABLE) ×2
GRASPER SUT TROCAR 14GX15 (MISCELLANEOUS) ×2 IMPLANT
KIT BASIN OR (CUSTOM PROCEDURE TRAY) ×2 IMPLANT
KIT TURNOVER KIT B (KITS) ×2 IMPLANT
NDL INSUFFLATION 14GA 120MM (NEEDLE) ×1 IMPLANT
NEEDLE INSUFFLATION 14GA 120MM (NEEDLE) ×2 IMPLANT
NS IRRIG 1000ML POUR BTL (IV SOLUTION) ×2 IMPLANT
PAD ARMBOARD 7.5X6 YLW CONV (MISCELLANEOUS) ×4 IMPLANT
PENCIL BUTTON HOLSTER BLD 10FT (ELECTRODE) ×1 IMPLANT
POUCH SPECIMEN RETRIEVAL 10MM (ENDOMECHANICALS) ×2 IMPLANT
RELOAD 45 VASCULAR/THIN (ENDOMECHANICALS) IMPLANT
RELOAD STAPLE 45 2.5 WHT GRN (ENDOMECHANICALS) IMPLANT
RELOAD STAPLE 45 3.5 BLU ETS (ENDOMECHANICALS) IMPLANT
RELOAD STAPLE TA45 3.5 REG BLU (ENDOMECHANICALS) ×4 IMPLANT
SCISSORS LAP 5X35 DISP (ENDOMECHANICALS) IMPLANT
SET IRRIG TUBING LAPAROSCOPIC (IRRIGATION / IRRIGATOR) ×2 IMPLANT
SET TUBE SMOKE EVAC HIGH FLOW (TUBING) ×2 IMPLANT
SHEARS HARMONIC ACE PLUS 36CM (ENDOMECHANICALS) ×2 IMPLANT
SLEEVE ENDOPATH XCEL 5M (ENDOMECHANICALS) ×2 IMPLANT
SPECIMEN JAR SMALL (MISCELLANEOUS) ×2 IMPLANT
SUT MNCRL AB 4-0 PS2 18 (SUTURE) ×3 IMPLANT
SYS BAG RETRIEVAL 10MM (BASKET) ×1
SYSTEM BAG RETRIEVAL 10MM (BASKET) IMPLANT
TOWEL GREEN STERILE FF (TOWEL DISPOSABLE) ×2 IMPLANT
TRAY FOLEY W/BAG SLVR 16FR (SET/KITS/TRAYS/PACK) ×2
TRAY FOLEY W/BAG SLVR 16FR ST (SET/KITS/TRAYS/PACK) ×1 IMPLANT
TRAY LAPAROSCOPIC MC (CUSTOM PROCEDURE TRAY) ×2 IMPLANT
TROCAR XCEL 12X100 BLDLESS (ENDOMECHANICALS) ×2 IMPLANT
TROCAR XCEL NON-BLD 5MMX100MML (ENDOMECHANICALS) ×2 IMPLANT
WATER STERILE IRR 1000ML POUR (IV SOLUTION) ×2 IMPLANT

## 2021-12-21 NOTE — H&P (Signed)
? ?CC: Acute appendicitis ? ?HPI: ?Deanna Myers is an 31 y.o. female with hx of GERD, asthma, GC/C presented to [facility] with acute onset of progressive/worsening lower abdominal pain. Has had vague cramps for weeks but something changed and was no longer lower left quadrant but suddenly right lower quadrant. Associated n/v. Denies sick contacts. Denies fevers, +chills. Pain is sharp and persistent. ? ?Underwent workup at [facility] and was found on CT to have evidence of acute appendicitis. Pelvic US was negative. She was transferred to [facility] for surgical evaluation. ? ?Past Medical History:  ?Diagnosis Date  ? ADHD (attention deficit hyperactivity disorder)   ? since childhood  ? Asthma   ? inhaler last used prior to pregnancy  ? BV (bacterial vaginosis)   ? Chlamydia   ? GERD (gastroesophageal reflux disease)   ? Gonorrhea   ? Hyperemesis   ? G3  ? ? ?Past Surgical History:  ?Procedure Laterality Date  ? CESAREAN SECTION N/A 12/21/2015  ? Procedure: CESAREAN SECTION;  Surgeon: Paula Compton, MD;  Location: [facility];  Service: Obstetrics;  Laterality: N/A;  ? NO PAST SURGERIES    ? ? ?Family History  ?Problem Relation Age of Onset  ? Hypertension Father   ? Hypertension Mother   ? Kidney disease Mother   ? Cervical cancer Paternal Aunt   ? Congestive Heart Failure Maternal Grandmother   ? Breast cancer Paternal Grandfather   ? ? ?Social:  reports that she has never smoked. She has never used smokeless tobacco. She reports current alcohol use. She reports that she does not use drugs. ? ?Allergies:  ?Allergies  ?Allergen Reactions  ? Shellfish Allergy Anaphylaxis  ? Adhesive [Tape] Swelling and Other (See Comments)  ?  Reaction:  Bruising   ? Latex Hives  ? Betadine [Povidone Iodine] Rash  ? ? ?Medications: I have reviewed the patient's current medications. ? ?Results for orders placed or performed during the hospital encounter of 12/20/21 (from the past 48 hour(s))  ?Lipase, blood     Status: None  ? Collection  Time: 12/20/21  7:16 PM  ?Result Value Ref Range  ? Lipase 16 11 - 51 U/L  ?  Comment: Performed at [facility], [address], [place], [place] [address]  ?Comprehensive metabolic panel     Status: Abnormal  ? Collection Time: 12/20/21  7:16 PM  ?Result Value Ref Range  ? Sodium 139 135 - 145 mmol/L  ? Potassium 3.6 3.5 - 5.1 mmol/L  ? Chloride 103 98 - 111 mmol/L  ? CO2 29 22 - 32 mmol/L  ? Glucose, Bld 86 70 - 99 mg/dL  ?  Comment: Glucose reference range applies only to samples taken after fasting for at least 8 hours.  ? BUN 12 6 - 20 mg/dL  ? Creatinine, Ser 0.65 0.44 - 1.00 mg/dL  ? Calcium 9.7 8.9 - 10.3 mg/dL  ? Total Protein 7.7 6.5 - 8.1 g/dL  ? Albumin 4.1 3.5 - 5.0 g/dL  ? AST 15 15 - 41 U/L  ? ALT 13 0 - 44 U/L  ? Alkaline Phosphatase 44 38 - 126 U/L  ? Total Bilirubin 0.2 (L) 0.3 - 1.2 mg/dL  ? GFR, Estimated >60 >60 mL/min  ?  Comment: (NOTE) ?Calculated using the CKD-EPI Creatinine Equation (2021) ?  ? Anion gap 7 5 - 15  ?  Comment: Performed at [facility], [address] [place], [place] [address]  ?CBC     Status:  None  ? Collection Time: 12/20/21  7:16 PM  ?Result Value Ref Range  ? WBC 7.8 4.0 - 10.5 K/uL  ? RBC 4.49 3.87 - 5.11 MIL/uL  ? Hemoglobin 12.1 12.0 - 15.0 g/dL  ? HCT 37.3 36.0 - 46.0 %  ? MCV 83.1 80.0 - 100.0 fL  ? MCH 26.9 26.0 - 34.0 pg  ? MCHC 32.4 30.0 - 36.0 g/dL  ? RDW 13.1 11.5 - 15.5 %  ? Platelets 379 150 - 400 K/uL  ? nRBC 0.0 0.0 - 0.2 %  ?  Comment: Performed at [facility], [address], [place], [place] [address]  ?hCG, serum, qualitative     Status: None  ? Collection Time: 12/21/21 12:23 AM  ?Result Value Ref Range  ? Preg, Serum NEGATIVE NEGATIVE  ?  Comment:        ?THE SENSITIVITY OF THIS ?METHODOLOGY IS >10 mIU/mL. ?Performed at [facility], [address] [place], [place] [address] ?  ?Resp Panel by RT-PCR (Flu A&B, Covid) Nasopharyngeal Swab     Status: None  ? Collection  Time: 12/21/21  3:37 AM  ? Specimen: Nasopharyngeal Swab; Nasopharyngeal(NP) swabs in vial transport medium  ?Result Value Ref Range  ? SARS Coronavirus 2 by RT PCR NEGATIVE NEGATIVE  ?  Comment: (NOTE) ?SARS-CoV-2 target nucleic acids are NOT DETECTED. ? ?The SARS-CoV-2 RNA is generally detectable in upper respiratory ?specimens during the acute phase of infection. The lowest ?concentration of SARS-CoV-2 viral copies this assay can detect is ?138 copies/mL. A negative result does not preclude SARS-Cov-2 ?infection and should not be used as the sole basis for treatment or ?other patient management decisions. A negative result may occur with  ?improper specimen collection/handling, submission of specimen other ?than nasopharyngeal swab, presence of viral mutation(s) within the ?areas targeted by this assay, and inadequate number of viral ?copies(<138 copies/mL). A negative result must be combined with ?clinical observations, patient history, and epidemiological ?information. The expected result is Negative. ? ?Fact Sheet for Patients:  ?EntrepreneurPulse.com.au ? ?Fact Sheet for Healthcare Providers:  ?IncredibleEmployment.be ? ?This test is no t yet approved or cleared by the [place] FDA and  ?has been authorized for detection and/or diagnosis of SARS-CoV-2 by ?FDA under an Emergency Use Authorization (EUA). This EUA will remain  ?in effect (meaning this test can be used) for the duration of the ?COVID-19 declaration under Section 564(b)(1) of the Act, 21 ?U.S.C.section 360bbb-3(b)(1), unless the authorization is terminated  ?or revoked sooner.  ? ? ?  ? Influenza A by PCR NEGATIVE NEGATIVE  ? Influenza B by PCR NEGATIVE NEGATIVE  ?  Comment: (NOTE) ?The Xpert Xpress SARS-CoV-2/FLU/RSV plus assay is intended as an aid ?in the diagnosis of influenza from Nasopharyngeal swab specimens and ?should not be used as a sole basis for treatment. Nasal washings and ?aspirates are  unacceptable for Xpert Xpress SARS-CoV-2/FLU/RSV ?testing. ? ?Fact Sheet for Patients: ?EntrepreneurPulse.com.au ? ?Fact Sheet for Healthcare Providers: ?IncredibleEmployment.be ? ?This test is not yet approved or cleared by the [place] FDA and ?has been authorized for detection and/or diagnosis of SARS-CoV-2 by ?FDA under an Emergency Use Authorization (EUA). This EUA will remain ?in effect (meaning this test can be used) for the duration of the ?COVID-19 declaration under Section 564(b)(1) of the Act, 21 U.S.C. ?section 360bbb-3(b)(1), unless the authorization is terminated or ?revoked. ? ?Performed at [facility], [address] ?[place], [place] [address] ?  ? ? ?CT ABDOMEN PELVIS W CONTRAST ? ?  Result Date: 12/21/2021 ?CLINICAL DATA:  Left lower quadrant abdominal pain EXAM: CT ABDOMEN AND PELVIS WITH CONTRAST TECHNIQUE: Multidetector CT imaging of the abdomen and pelvis was performed using the standard protocol following bolus administration of intravenous contrast. RADIATION DOSE REDUCTION: This exam was performed according to the departmental dose-optimization program which includes automated exposure control, adjustment of the mA and/or kV according to patient size and/or use of iterative reconstruction technique. CONTRAST:  127mL OMNIPAQUE IOHEXOL 300 MG/ML  SOLN COMPARISON:  05/14/2021 FINDINGS: Lower chest: No acute abnormality. Hepatobiliary: No focal liver abnormality is seen. No gallstones, gallbladder wall thickening, or biliary dilatation. Pancreas: Unremarkable Spleen: Unremarkable Adrenals/Urinary Tract: Adrenal glands are unremarkable. Kidneys are normal, without renal calculi, focal lesion, or hydronephrosis. Bladder is unremarkable. Stomach/Bowel: The appendix is dilated and fluid-filled, hyperemic, contains a roughly 2 cm partially calcified appendicoliths at its base, and demonstrates mild periappendiceal inflammatory stranding in  keeping with changes of acute, unruptured, complicated appendicitis. No periappendiceal fluid collections or extraluminal gas is identified. The stomach, small bowel, and large bowel are otherwise unremarkable.

## 2021-12-21 NOTE — ED Triage Notes (Signed)
Pt arrived via [facility] c/o LLQ pain. CT positive for appendicitis. Pt axo x4 reporting 6/10 pain. VSS at this time ?

## 2021-12-21 NOTE — Op Note (Signed)
Date: 12/21/21 ? ?Patient: Deanna Myers ?MRN: [id number] ? ?Preoperative Diagnosis: Acute appendicitis ?Postoperative Diagnosis: Same ? ?Procedure: Laparoscopic appendectomy ? ?Surgeon: Sophronia Simas, MD ? ?EBL: Minimal ? ?Anesthesia: General endotracheal ? ?Specimens: Appendix ? ?Indications: [name] is a 31 yo female who presented with progressive lower abdominal pain which migrated to the right lower quadrant. CT scan showed acute appendicitis. She was brought to the operating room for appendectomy. ? ?Findings: Acute appendicitis without perforation or abscess. ? ?Procedure details: Informed consent was obtained in the preoperative area prior to the procedure. The patient was brought to the operating room and placed on the table in the supine position. General anesthesia was induced and appropriate lines and drains were placed for intraoperative monitoring. Perioperative antibiotics were administered per SCIP guidelines. The abdomen was prepped and draped in the usual sterile fashion. A pre-procedure timeout was taken verifying patient identity, surgical site and procedure to be performed. ? ?A small infraumbilical skin incision was made and the subcutaneous tissue was spread to expose the fascia. The umbilical stalk was grasped and elevated, a Veress needle was inserted through the fascia, and intraperitoneal placement was confirmed with the saline drop test. The abdomen was insufflated and a 7mm port was placed. The abdomen was inspected with no evidence of visceral or vascular injury. A suprapubic 29mm port was placed, then the umbilical port was upsized to a 96mm under direct visualization. A 67mm port was then placed in the LLQ under direct visualization. The appendix was identified in the RLQ, and was inflamed and dilated, but there were no signs of perforation or abscess. The appendix was grasped and elevated, and a mesenteric window was bluntly created at the base of the appendix. The base of the   appendix was palpated with a grasper and there did not seem to be a fecalith at the juncture with the cecum. The appendix was then divided at the base from the cecum using two fires of a 73mm stapler with a blue load. The mesoappendix was then divided with Harmonic shears. The specimen was placed in an endocatch bag. The surgical site was irrigated. The staple line was inspected and appeared in tact with no bleeding or leakage. The ports were removed and the pneumoperitoneum was evacuated. The specimen was removed via the umbilical port and sent for routine pathology. The umbilical port site fascia was closed with an 0 Vicryl suture. The skin at all port sites was closed with 4-0 monocryl subcuticular suture. Dermabond was applied. ? ?The patient tolerated the procedure well with no apparent complications. All counts were correct x2 at the end of the procedure. The patient was extubated and taken to PACU in stable condition. ? ?Sophronia Simas, MD ?12/21/21 ?3:14 PM ? ? ?

## 2021-12-21 NOTE — Discharge Instructions (Addendum)
[facility]  Please arrive at least 30 min before your appointment to complete your check in paperwork.  If you are unable to arrive 30 min prior to your appointment time we may have to cancel or reschedule you. LAPAROSCOPIC SURGERY: POST OP INSTRUCTIONS Always review your discharge instruction sheet given to you by the facility where your surgery was performed. IF YOU HAVE DISABILITY OR FAMILY LEAVE FORMS, YOU MUST BRING THEM TO THE OFFICE FOR PROCESSING.   DO NOT GIVE THEM TO YOUR DOCTOR.  PAIN CONTROL  First take acetaminophen (Tylenol) AND/or ibuprofen (Advil) to control your pain after surgery.  Follow directions on package.  Taking acetaminophen (Tylenol) and/or ibuprofen (Advil) regularly after surgery will help to control your pain and lower the amount of prescription pain medication you may need.  You should not take more than 4,000 mg (4 grams) of acetaminophen (Tylenol) in 24 hours.  You should not take ibuprofen (Advil), aleve, motrin, naprosyn or other NSAIDS if you have a history of stomach ulcers or chronic kidney disease.  A prescription for pain medication may be given to you upon discharge.  Take your pain medication as prescribed, if you still have uncontrolled pain after taking acetaminophen (Tylenol) or ibuprofen (Advil). Use ice packs to help control pain. If you need a refill on your pain medication, please contact your pharmacy.  They will contact our office to request authorization. Prescriptions will not be filled after 5pm or on week-ends.  HOME MEDICATIONS Take your usually prescribed medications unless otherwise directed.  DIET You should follow a light diet the first few days after arrival home.  Be sure to include lots of fluids daily. Avoid fatty, fried foods.   CONSTIPATION It is common to experience some constipation after surgery and if you are taking pain medication.  Increasing fluid intake and taking a stool softener (such as Colace)  will usually help or prevent this problem from occurring.  A mild laxative (Milk of Magnesia or Miralax) should be taken according to package instructions if there are no bowel movements after 48 hours.  WOUND/INCISION CARE Most patients will experience some swelling and bruising in the area of the incisions.  Ice packs will help.  Swelling and bruising can take several days to resolve.  Unless discharge instructions indicate otherwise, follow guidelines below  STERI-STRIPS - you may remove your outer bandages 48 hours after surgery, and you may shower at that time.  You have steri-strips (small skin tapes) in place directly over the incision.  These strips should be left on the skin for 7-10 days.   DERMABOND/SKIN GLUE - you may shower in 24 hours.  The glue will flake off over the next 2-3 weeks. Any sutures or staples will be removed at the office during your follow-up visit.  ACTIVITIES You may resume regular (light) daily activities beginning the next day--such as daily self-care, walking, climbing stairs--gradually increasing activities as tolerated.  You may have sexual intercourse when it is comfortable.  Refrain from any heavy lifting or straining until approved by your doctor. You may drive when you are no longer taking prescription pain medication, you can comfortably wear a seatbelt, and you can safely maneuver your car and apply brakes.  FOLLOW-UP You should see your doctor in the office for a follow-up appointment approximately 2-3 weeks after your surgery.  You should have been given your post-op/follow-up appointment when your surgery was scheduled.  If you did not receive a post-op/follow-up appointment, make sure   that you call for this appointment within a day or two after you arrive home to insure a convenient appointment time.  OTHER INSTRUCTIONS  WHEN TO CALL YOUR DOCTOR: Fever over 101.0 Inability to urinate Continued bleeding from incision. Increased pain, redness, or  drainage from the incision. Increasing abdominal pain  The clinic staff is available to answer your questions during regular business hours.  Please don't hesitate to call and ask to speak to one of the nurses for clinical concerns.  If you have a medical emergency, go to the nearest emergency room or call 911.  A surgeon from [facility] is always on call at the hospital. [address], [address], [place], [place]  [address] ? [address] [address], , [place]   [address] [phone] ? [phone] ? FAX [phone]   

## 2021-12-21 NOTE — ED Notes (Addendum)
ED provider at bedside.

## 2021-12-21 NOTE — ED Notes (Signed)
[facility] arrived to transport pt. Pt stable at time of departure ?

## 2021-12-21 NOTE — ED Notes (Signed)
General surgery at bedside. 

## 2021-12-21 NOTE — ED Provider Notes (Signed)
?[facility] ?Provider Note ? ? ?CSN: [id number] ?Arrival date & time: 12/20/21  1844 ? ?  ? ?History ? ?Chief Complaint  ?Patient presents with  ? Abdominal Pain  ? ? ?Deanna Myers is a 31 y.o. female. ? ?HPI ? ?  ? ?This a 31 year old female who presents with left lower quadrant pain.  Patient reports she has had several week history of intermittent left lower quadrant pain.  Over the last 24 to 48 hours it has gotten acutely worse.  She states that it is sharp and intermittent. She states this pain is different than the recent pain shes been having and more midline.  Nothing seems to make it better or worse.  She has been taking ibuprofen with minimal relief.  No urinary symptoms.  No nausea, vomiting, or diarrhea.  She saw her primary care doctor and her OB/GYN.  She reports that she had an ultrasound at her OB/GYN's office that showed an ovarian cyst.  There was no mention of torsion.  She also was tested for STDs.  She has no vaginal discharge.  Patient states that the pain currently is worse than it has been over the last several weeks. ? ?Chart reviewed.  It appears she had full STD testing at her OB/GYN office.  I cannot view ultrasound results and there is not a full note for review. ? ?Home Medications ?Prior to Admission medications   ?Medication Sig Start Date End Date Taking? Authorizing Provider  ?cephALEXin (KEFLEX) 500 MG capsule Take 1 capsule (500 mg total) by mouth 4 (four) times daily. 10/04/21   Teodora Medici, FNP  ?methocarbamol (ROBAXIN) 500 MG tablet Take 1 tablet (500 mg total) by mouth 2 (two) times daily. ?Patient not taking: Reported on 09/10/2021 12/16/20   Henderly, Britni A, PA-C  ?naproxen (NAPROSYN) 500 MG tablet Take 1 tablet (500 mg total) by mouth 2 (two) times daily. ?Patient not taking: Reported on 09/10/2021 12/16/20   Henderly, Britni A, PA-C  ?ondansetron (ZOFRAN) 4 MG tablet Take 1 tablet (4 mg total) by mouth every 8 (eight) hours as needed  for nausea or vomiting. 09/10/21   Pieter Partridge, DO  ?predniSONE (DELTASONE) 10 MG tablet Begin with 6 tabs on day 1, 5 tab on day 2, 4 tab on day 3, 3 tab on day 4, 2 tab on day 5, 1 tab on day 6-take with food 12/24/20   Wieters, [employer] C, PA-C  ?rizatriptan (MAXALT) 10 MG tablet Take 1 tablet earliest onset of headache.  May repeat in 2 hours if needed.  Maximum 2 tablets in 24 hours. 09/10/21   Pieter Partridge, DO  ?tiZANidine (ZANAFLEX) 4 MG tablet Take 0.5-1 tablets (2-4 mg total) by mouth every 6 (six) hours as needed for muscle spasms. ?Patient not taking: Reported on 09/10/2021 12/24/20   Wieters, Madelynn Done C, PA-C  ?topiramate (TOPAMAX) 25 MG tablet Take 1 tablet (25 mg total) by mouth at bedtime. 09/10/21   Pieter Partridge, DO  ?albuterol (PROVENTIL HFA;VENTOLIN HFA) 108 (90 BASE) MCG/ACT inhaler Inhale 1-2 puffs into the lungs every 6 (six) hours as needed for wheezing or shortness of breath.  05/21/20  [provider]  ?omeprazole (PRILOSEC) 20 MG capsule Take 1 capsule (20 mg total) by mouth 2 (two) times daily. ?Patient not taking: Reported on 02/10/2018 07/03/17 09/18/19  Tanna Furry, MD  ?SUMAtriptan (IMITREX) 100 MG tablet Take 1 tablet earliest onset of migraine.  May repeat once in 2 hours  if headache persists or recurs.  Do not exceed 2 tablets in 24 hours 02/10/18 05/21/20  Pieter Partridge, DO  ?   ? ?Allergies    ?Shellfish allergy, Adhesive [tape], Latex, and Betadine [povidone iodine]   ? ?Review of Systems   ?Review of Systems  ?Constitutional:  Negative for fever.  ?Gastrointestinal:  Positive for abdominal pain. Negative for diarrhea, nausea and vomiting.  ?Genitourinary:  Negative for dysuria.  ?All other systems reviewed and are negative. ? ?Physical Exam ?Updated Vital Signs ?BP 100/61   Pulse 88   Temp 98.7 ?F (37.1 ?C) (Oral)   Resp 18   Ht 1.676 m (5\' 6" )   Wt 103.4 kg   LMP 12/01/2021   SpO2 100%   BMI 36.80 kg/m?  ?Physical Exam ?Vitals and nursing note reviewed.  ?Constitutional:    ?   Appearance: She is well-developed. She is obese. She is not ill-appearing.  ?HENT:  ?   Head: Normocephalic and atraumatic.  ?Eyes:  ?   Pupils: Pupils are equal, round, and reactive to light.  ?Cardiovascular:  ?   Rate and Rhythm: Normal rate and regular rhythm.  ?   Heart sounds: Normal heart sounds.  ?Pulmonary:  ?   Effort: Pulmonary effort is normal. No respiratory distress.  ?   Breath sounds: No wheezing.  ?Abdominal:  ?   General: Bowel sounds are normal.  ?   Palpations: Abdomen is soft.  ?   Tenderness: There is abdominal tenderness in the suprapubic area and left lower quadrant. There is no guarding or rebound.  ?Musculoskeletal:  ?   Cervical back: Neck supple.  ?Skin: ?   General: Skin is warm and dry.  ?Neurological:  ?   Mental Status: She is alert and oriented to person, place, and time.  ? ? ?ED Results / Procedures / Treatments   ?Labs ?(all labs ordered are listed, but only abnormal results are displayed) ?Labs Reviewed  ?COMPREHENSIVE METABOLIC PANEL - Abnormal; Notable for the following components:  ?    Result Value  ? Total Bilirubin 0.2 (*)   ? All other components within normal limits  ?LIPASE, BLOOD  ?CBC  ?HCG, SERUM, QUALITATIVE  ?URINALYSIS, ROUTINE W REFLEX MICROSCOPIC  ? ? ?EKG ?None ? ?Radiology ?CT ABDOMEN PELVIS W CONTRAST ? ?Result Date: 12/21/2021 ?CLINICAL DATA:  Left lower quadrant abdominal pain EXAM: CT ABDOMEN AND PELVIS WITH CONTRAST TECHNIQUE: Multidetector CT imaging of the abdomen and pelvis was performed using the standard protocol following bolus administration of intravenous contrast. RADIATION DOSE REDUCTION: This exam was performed according to the departmental dose-optimization program which includes automated exposure control, adjustment of the mA and/or kV according to patient size and/or use of iterative reconstruction technique. CONTRAST:  163mL OMNIPAQUE IOHEXOL 300 MG/ML  SOLN COMPARISON:  05/14/2021 FINDINGS: Lower chest: No acute abnormality.  Hepatobiliary: No focal liver abnormality is seen. No gallstones, gallbladder wall thickening, or biliary dilatation. Pancreas: Unremarkable Spleen: Unremarkable Adrenals/Urinary Tract: Adrenal glands are unremarkable. Kidneys are normal, without renal calculi, focal lesion, or hydronephrosis. Bladder is unremarkable. Stomach/Bowel: The appendix is dilated and fluid-filled, hyperemic, contains a roughly 2 cm partially calcified appendicoliths at its base, and demonstrates mild periappendiceal inflammatory stranding in keeping with changes of acute, unruptured, complicated appendicitis. No periappendiceal fluid collections or extraluminal gas is identified. The stomach, small bowel, and large bowel are otherwise unremarkable. Vascular/Lymphatic: No significant vascular findings are present. No enlarged abdominal or pelvic lymph nodes. Reproductive: Corpus luteum noted within the right  ovary. The pelvic organs are otherwise unremarkable. Trace free fluid within the pelvis may be physiologic in a female patient of this age. Other: No abdominal wall hernia. Musculoskeletal: No acute or significant osseous findings. IMPRESSION: Acute, unruptured, complicated appendicitis. Appendix: Location: Inferior to cecum Diameter: 13 mm Appendicolith: Present Mucosal hyperenhancement: Present Extraluminal gas: None Periappendical collection: None Electronically Signed   By: Fidela Salisbury M.D.   On: 12/21/2021 02:31  ? ?US PELVIC COMPLETE W TRANSVAGINAL AND TORSION R/O ? ?Result Date: 12/21/2021 ?CLINICAL DATA:  Bilateral pelvic pain, history of ovarian cyst EXAM: TRANSABDOMINAL AND TRANSVAGINAL ULTRASOUND OF PELVIS DOPPLER ULTRASOUND OF OVARIES TECHNIQUE: Both transabdominal and transvaginal ultrasound examinations of the pelvis were performed. Transabdominal technique was performed for global imaging of the pelvis including uterus, ovaries, adnexal regions, and pelvic cul-de-sac. It was necessary to proceed with endovaginal exam  following the transabdominal exam to visualize the uterus and bilateral ovaries. Color and duplex Doppler ultrasound was utilized to evaluate blood flow to the ovaries. COMPARISON:  None. FINDINGS: Uterus Measurements: 7.

## 2021-12-21 NOTE — Anesthesia Procedure Notes (Addendum)
Procedure Name: Intubation ?Date/Time: 12/21/2021 12:10 PM ?Performed by: Thelma Comp, CRNA ?Pre-anesthesia Checklist: Patient identified, Emergency Drugs available, Suction available and Patient being monitored ?Patient Re-evaluated:Patient Re-evaluated prior to induction ?Oxygen Delivery Method: Circle System Utilized ?Preoxygenation: Pre-oxygenation with 100% oxygen ?Induction Type: IV induction ?Ventilation: Mask ventilation without difficulty ?Laryngoscope Size: Mac and 3 ?Grade View: Grade I ?Tube type: Oral ?Tube size: 7.0 mm ?Number of attempts: 1 ?Airway Equipment and Method: Stylet ?Placement Confirmation: ETT inserted through vocal cords under direct vision, positive ETCO2 and breath sounds checked- equal and bilateral ?Secured at: 22 cm ?Tube secured with: Tape ?Dental Injury: Teeth and Oropharynx as per pre-operative assessment  ? ? ? ? ?

## 2021-12-21 NOTE — Anesthesia Preprocedure Evaluation (Addendum)
Anesthesia Evaluation  ?Patient identified by MRN, date of birth, ID band ?Patient awake ? ? ? ?Reviewed: ?Allergy & Precautions, NPO status , Patient's Chart, lab work & pertinent test results ? ?Airway ?Mallampati: III ? ?TM Distance: >3 FB ?Neck ROM: Full ? ? ? Dental ? ?(+) Teeth Intact, Dental Advisory Given, Chipped,  ?  ?Pulmonary ?asthma ,  ?  ?Pulmonary exam normal ?breath sounds clear to auscultation ? ? ? ? ? ? Cardiovascular ?negative cardio ROS ?Normal cardiovascular exam ?Rhythm:Regular Rate:Normal ? ? ?  ?Neuro/Psych ?negative neurological ROS ?   ? GI/Hepatic ?Neg liver ROS, GERD  ,Appendicitis ?  ?Endo/Other  ?Obesity ? ? Renal/GU ?negative Renal ROS  ? ?  ?Musculoskeletal ?negative musculoskeletal ROS ?(+)  ? Abdominal ?  ?Peds ? ?(+) ADHD Hematology ?negative hematology ROS ?(+)   ?Anesthesia Other Findings ?Day of surgery medications reviewed with the patient. ? Reproductive/Obstetrics ?negative OB ROS ? ?  ? ? ? ? ? ? ? ? ? ? ? ? ? ?  ?  ? ? ? ? ? ? ? ?Anesthesia Physical ?Anesthesia Plan ? ?ASA: 2 ? ?Anesthesia Plan: General  ? ?Post-op Pain Management: Ofirmev IV (intra-op)*  ? ?Induction: Intravenous, Rapid sequence and Cricoid pressure planned ? ?PONV Risk Score and Plan: 4 or greater and Midazolam, Dexamethasone and Ondansetron ? ?Airway Management Planned: Oral ETT ? ?Additional Equipment:  ? ?Intra-op Plan:  ? ?Post-operative Plan: Extubation in OR ? ?Informed Consent: I have reviewed the patients History and Physical, chart, labs and discussed the procedure including the risks, benefits and alternatives for the proposed anesthesia with the patient or authorized representative who has indicated his/her understanding and acceptance.  ? ? ? ?Dental advisory given ? ?Plan Discussed with: CRNA ? ?Anesthesia Plan Comments:   ? ? ? ? ? ? ?Anesthesia Quick Evaluation ? ?

## 2021-12-21 NOTE — Transfer of Care (Signed)
Immediate Anesthesia Transfer of Care Note ? ?Patient: Deanna Myers ? ?Procedure(s) Performed: APPENDECTOMY LAPAROSCOPIC (Abdomen) ? ?Patient Location: PACU ? ?Anesthesia Type:General ? ?Level of Consciousness: drowsy and patient cooperative ? ?Airway & Oxygen Therapy: Patient Spontanous Breathing ? ?Post-op Assessment: Report given to RN and Post -op Vital signs reviewed and stable ? ?Post vital signs: Reviewed and stable ? ?Last Vitals:  ?Vitals Value Taken Time  ?BP 122/68 12/21/21 1312  ?Temp    ?Pulse 100 12/21/21 1314  ?Resp 13 12/21/21 1314  ?SpO2 100 % 12/21/21 1314  ?Vitals shown include unvalidated device data. ? ?Last Pain:  ?Vitals:  ? 12/21/21 1033  ?TempSrc: Oral  ?PainSc:   ?   ? ?  ? ?Complications: No notable events documented. ?

## 2021-12-21 NOTE — Progress Notes (Signed)
Risks, benefits, alternatives to appendectomy reviewed.  Patient granted consent.  Will proceed as scheduled. ? ?Quentin Ore, MD ?General, Bariatric and Minimally Invasive Surgery ?[facility], [place] ? ?

## 2021-12-21 NOTE — Progress Notes (Signed)
Patient examined in preop. Details of laparoscopic appendectomy were reviewed and she agrees to proceed with surgery. Proceed to OR.  ? ?Michaelle Birks, MD ?[facility] ?General, Hepatobiliary and Pancreatic Surgery ?12/21/21 11:50 AM ? ?

## 2021-12-21 NOTE — Anesthesia Postprocedure Evaluation (Signed)
Anesthesia Post Note ? ?Patient: Deanna Myers ? ?Procedure(s) Performed: APPENDECTOMY LAPAROSCOPIC (Abdomen) ? ?  ? ?Patient location during evaluation: PACU ?Anesthesia Type: General ?Level of consciousness: awake and alert ?Pain management: pain level controlled ?Vital Signs Assessment: post-procedure vital signs reviewed and stable ?Respiratory status: spontaneous breathing, nonlabored ventilation, respiratory function stable and patient connected to nasal cannula oxygen ?Cardiovascular status: blood pressure returned to baseline and stable ?Postop Assessment: no apparent nausea or vomiting ?Anesthetic complications: no ? ? ?No notable events documented. ? ?Last Vitals:  ?Vitals:  ? 12/21/21 1400 12/21/21 1415  ?BP: 107/66 111/70  ?Pulse: 78 94  ?Resp: 13 12  ?Temp:  36.7 ?C  ?SpO2: 99% 100%  ?  ?Last Pain:  ?Vitals:  ? 12/21/21 1352  ?TempSrc:   ?PainSc: 4   ? ? ?  ?  ?  ?  ?  ?  ? ?Collene Schlichter ? ? ? ? ?

## 2021-12-22 ENCOUNTER — Encounter (HOSPITAL_COMMUNITY): Payer: Self-pay | Admitting: Surgery

## 2021-12-23 LAB — SURGICAL PATHOLOGY

## 2022-01-10 ENCOUNTER — Encounter (HOSPITAL_BASED_OUTPATIENT_CLINIC_OR_DEPARTMENT_OTHER): Payer: Self-pay

## 2022-01-10 ENCOUNTER — Other Ambulatory Visit: Payer: Self-pay

## 2022-01-10 DIAGNOSIS — J45909 Unspecified asthma, uncomplicated: Secondary | ICD-10-CM | POA: Diagnosis not present

## 2022-01-10 DIAGNOSIS — M79605 Pain in left leg: Secondary | ICD-10-CM | POA: Diagnosis present

## 2022-01-10 DIAGNOSIS — Z91040 Latex allergy status: Secondary | ICD-10-CM | POA: Insufficient documentation

## 2022-01-10 NOTE — ED Triage Notes (Addendum)
Pt arrives with c/o left leg and ankle pain that started 3 days ago. Pt recently had surgery on her appendix. Per pt, pain starts around mid calf and goes down into her ankle. Pts ankle is swollen. Pt denies injury. Pt denies CP or SOB.  ?

## 2022-01-11 ENCOUNTER — Emergency Department (HOSPITAL_BASED_OUTPATIENT_CLINIC_OR_DEPARTMENT_OTHER)
Admission: EM | Admit: 2022-01-11 | Discharge: 2022-01-11 | Disposition: A | Discharge status: Home / Self Care | Payer: Medicaid Other | Attending: Emergency Medicine | Admitting: Emergency Medicine

## 2022-01-11 ENCOUNTER — Emergency Department (HOSPITAL_BASED_OUTPATIENT_CLINIC_OR_DEPARTMENT_OTHER)
Admission: RE | Admit: 2022-01-11 | Discharge: 2022-01-11 | Disposition: A | Discharge status: Home / Self Care | Payer: Medicaid Other | Source: Ambulatory Visit | Attending: Emergency Medicine | Admitting: Emergency Medicine

## 2022-01-11 DIAGNOSIS — M79606 Pain in leg, unspecified: Secondary | ICD-10-CM

## 2022-01-11 DIAGNOSIS — M79605 Pain in left leg: Secondary | ICD-10-CM

## 2022-01-11 MED ORDER — HYDROCODONE-ACETAMINOPHEN 5-325 MG PO TABS
1.0000 | ORAL_TABLET | Freq: Once | ORAL | Status: AC
  Administered 2022-01-11: 1 via ORAL
  Filled 2022-01-11: qty 1

## 2022-01-11 MED ORDER — ENOXAPARIN SODIUM 100 MG/ML IJ SOSY
100.0000 mg | PREFILLED_SYRINGE | Freq: Once | INTRAMUSCULAR | Status: AC
  Administered 2022-01-11: 100 mg via SUBCUTANEOUS
  Filled 2022-01-11: qty 1

## 2022-01-11 NOTE — ED Provider Notes (Signed)
?[facility] ?Provider Note ? ? ?CSN: [id number] ?Arrival date & time: 01/10/22  2339 ? ?  ? ?History ? ?Chief Complaint  ?Patient presents with  ? Ankle Pain  ? Leg Pain  ? ? ?[name] is a 31 y.o. female. ? ?HPI ? ?  ? ?This is a 31 year old female who presents with left leg pain and swelling.  Patient reports 2 to 3-day history of worsening left leg pain and swelling.  She denies any injury.  She states that the pain starts in her calf runs into her ankle.  She has noted swelling there as well.  She states that tonight it got worse.  She denies chest pain or shortness of breath.  She did recently have a surgery several weeks ago for an appendectomy. ? ?Home Medications ?Prior to Admission medications   ?Medication Sig Start Date End Date Taking? Authorizing Provider  ?acetaminophen (TYLENOL) 500 MG tablet Take 1,000 mg by mouth every 6 (six) hours as needed for moderate pain or headache.    [provider]  ?Cholecalciferol (VITAMIN D-3 PO) Take 1 tablet by mouth daily. Unsure of how many units    [provider]  ?ibuprofen (ADVIL) 200 MG tablet Take 400 mg by mouth every 6 (six) hours as needed for headache or moderate pain.    [provider]  ?ondansetron (ZOFRAN) 4 MG tablet Take 1 tablet (4 mg total) by mouth every 8 (eight) hours as needed for nausea or vomiting. 09/10/21   [name], DO  ?oxyCODONE (OXY IR/ROXICODONE) 5 MG immediate release tablet Take 1 tablet (5 mg total) by mouth every 6 (six) hours as needed for severe pain. 12/21/21   Meuth, Brooke A, PA-C  ?rizatriptan (MAXALT) 10 MG tablet Take 1 tablet earliest onset of headache.  May repeat in 2 hours if needed.  Maximum 2 tablets in 24 hours. 09/10/21   [name], DO  ?albuterol (PROVENTIL HFA;VENTOLIN HFA) 108 (90 BASE) MCG/ACT inhaler Inhale 1-2 puffs into the lungs every 6 (six) hours as needed for wheezing or shortness of breath.  05/21/20  [provider]   ?omeprazole (PRILOSEC) 20 MG capsule Take 1 capsule (20 mg total) by mouth 2 (two) times daily. ?Patient not taking: Reported on 02/10/2018 07/03/17 09/18/19  [name], MD  ?SUMAtriptan (IMITREX) 100 MG tablet Take 1 tablet earliest onset of migraine.  May repeat once in 2 hours if headache persists or recurs.  Do not exceed 2 tablets in 24 hours 02/10/18 05/21/20  [name], DO  ?   ? ?Allergies    ?Shellfish allergy, Adhesive [tape], Latex, and Betadine [povidone iodine]   ? ?Review of Systems   ?Review of Systems  ?Constitutional:  Negative for fever.  ?Respiratory:  Negative for shortness of breath.   ?Cardiovascular:  Positive for leg swelling. Negative for chest pain.  ?All other systems reviewed and are negative. ? ?Physical Exam ?Updated Vital Signs ?BP 112/65 (BP Location: Left Arm)   Pulse 70   Temp 97.9 ?F (36.6 ?C) (Oral)   Resp 20   Ht 1.676 m (5\' 6" )   Wt 103.4 kg   SpO2 100%   BMI 36.79 kg/m?  ?Physical Exam ?Vitals and nursing note reviewed.  ?Constitutional:   ?   Appearance: She is well-developed. She is obese. She is not ill-appearing.  ?HENT:  ?   Head: Normocephalic and atraumatic.  ?   Nose: Nose normal.  ?   Mouth/Throat:  ?  Mouth: Mucous membranes are moist.  ?Eyes:  ?   Pupils: Pupils are equal, round, and reactive to light.  ?Cardiovascular:  ?   Rate and Rhythm: Normal rate and regular rhythm.  ?Pulmonary:  ?   Effort: Pulmonary effort is normal. No respiratory distress.  ?Abdominal:  ?   Palpations: Abdomen is soft.  ?   Tenderness: There is no abdominal tenderness.  ?   Comments: Surgical incisions clean dry and intact  ?Musculoskeletal:     ?   General: Swelling and tenderness present.  ?   Cervical back: Neck supple.  ?   Comments: Asymmetric swelling of the left calf with tenderness palpation, no overlying skin changes or erythema, 2+ DP pulse  ?Skin: ?   General: Skin is warm and dry.  ?Neurological:  ?   Mental Status: She is alert and oriented to person, place, and  time.  ?Psychiatric:     ?   Mood and Affect: Mood normal.  ? ? ?ED Results / Procedures / Treatments   ?Labs ?(all labs ordered are listed, but only abnormal results are displayed) ?Labs Reviewed - No data to display ? ?EKG ?None ? ?Radiology ?No results found. ? ?Procedures ?Procedures  ? ? ?Medications Ordered in ED ?Medications  ?enoxaparin (LOVENOX) injection 100 mg (has no administration in time range)  ? ? ?ED Course/ Medical Decision Making/ A&P ?  ?                        ?Medical Decision Making ?Risk ?Prescription drug management. ? ? ?This patient presents to the ED for concern of leg swelling and pain, this involves an extensive number of treatment options, and is a complaint that carries with it a high risk of complications and morbidity.  The differential diagnosis includes occult injury, DVT, no evidence of cellulitis or infection ? ?MDM:   ? ?This is a 31 year old female who presents with left leg swelling and pain.  She is nontoxic she did recently have her appendix removed which would put her at slightly higher risk for DVT.  She has pain and swelling objectively on exam.  No overlying skin changes or erythema to suggest infection.  Patient was given 1 dose of Lovenox.  She will return later today to rule out DVT.  Indication for x-ray imaging as she denies injury and is weightbearing. ?(Labs, imaging) ? ?Labs: ?I Ordered, and personally interpreted labs.  The pertinent results include: None ? ?Imaging Studies ordered: ?I ordered imaging studies including ultrasound ordered for later today ?I independently visualized and interpreted imaging. ?I agree with the radiologist interpretation ? ?Additional history obtained from chart review.  External records from outside source obtained and reviewed including surgical notes ? ?Critical Interventions: ?N/A ? ?Consultations: ?I requested consultation with the NA,  and discussed lab and imaging findings as well as pertinent plan - they recommend:  N/A ? ?Cardiac Monitoring: ?The patient was maintained on a cardiac monitor.  I personally viewed and interpreted the cardiac monitored which showed an underlying rhythm of: Normal sinus rhythm ? ?Reevaluation: ?After the interventions noted above, I reevaluated the patient and found that they have :stayed the same ? ? ?Considered admission for: N/A ? ?Social Determinants of Health: ?Lives independently ? ?Disposition: Discharge ? ?Co morbidities that complicate the patient evaluation ? ?Past Medical History:  ?Diagnosis Date  ? ADHD (attention deficit hyperactivity disorder)   ? since childhood  ? Asthma   ? inhaler last  used prior to pregnancy  ? BV (bacterial vaginosis)   ? Chlamydia   ? GERD (gastroesophageal reflux disease)   ? Gonorrhea   ? Hyperemesis   ? G3  ?  ? ?Medicines ?Meds ordered this encounter  ?Medications  ? enoxaparin (LOVENOX) injection 100 mg  ?  ?I have reviewed the patients home medicines and have made adjustments as needed ? ?Problem List / ED Course: ?Problem List Items Addressed This Visit   ?None ?Visit Diagnoses   ? ? Left leg pain    -  Primary  ? ?  ?  ? ? ? ? ? ? ? ? ? ? ? ? ?Final Clinical Impression(s) / ED Diagnoses ?Final diagnoses:  ?Left leg pain  ? ? ?Rx / DC Orders ?ED Discharge Orders   ? ?      Ordered  ?  US Venous Img Lower Unilateral Left       ? 01/11/22 0157  ? ?  ?  ? ?  ? ? ?  ?Shon BatonHorton, Trichelle Lehan F, MD ?01/11/22 0201 ? ?

## 2022-01-11 NOTE — Discharge Instructions (Addendum)
You were seen today for left leg pain and swelling.  Return later today for ultrasound imaging.  You were given a dose of Lovenox to treat.  If you are found to have a DVT, you will receive further instructions. ? ? ?

## 2022-01-14 ENCOUNTER — Emergency Department (HOSPITAL_BASED_OUTPATIENT_CLINIC_OR_DEPARTMENT_OTHER)
Admission: EM | Admit: 2022-01-14 | Discharge: 2022-01-14 | Disposition: A | Discharge status: Home / Self Care | Payer: Medicaid Other | Attending: Emergency Medicine | Admitting: Emergency Medicine

## 2022-01-14 ENCOUNTER — Encounter (HOSPITAL_BASED_OUTPATIENT_CLINIC_OR_DEPARTMENT_OTHER): Payer: Self-pay

## 2022-01-14 ENCOUNTER — Other Ambulatory Visit: Payer: Self-pay

## 2022-01-14 DIAGNOSIS — M7989 Other specified soft tissue disorders: Secondary | ICD-10-CM | POA: Diagnosis present

## 2022-01-14 DIAGNOSIS — Z91040 Latex allergy status: Secondary | ICD-10-CM | POA: Diagnosis not present

## 2022-01-14 DIAGNOSIS — Z79899 Other long term (current) drug therapy: Secondary | ICD-10-CM | POA: Diagnosis not present

## 2022-01-14 MED ORDER — FUROSEMIDE 40 MG PO TABS: 40.0000 mg | ORAL_TABLET | Freq: Every day | ORAL | 0 refills | Status: DC

## 2022-01-14 MED ORDER — TRAMADOL HCL 50 MG PO TABS: 50.0000 mg | ORAL_TABLET | Freq: Four times a day (QID) | ORAL | 0 refills | Status: DC | PRN

## 2022-01-14 MED ORDER — FUROSEMIDE 40 MG PO TABS
40.0000 mg | ORAL_TABLET | Freq: Once | ORAL | Status: AC
  Administered 2022-01-14: 40 mg via ORAL
  Filled 2022-01-14: qty 1

## 2022-01-14 NOTE — ED Notes (Signed)
Pt requesting to speak to MD prior to leaving. ?

## 2022-01-14 NOTE — Discharge Instructions (Addendum)
Please take Lasix 40 mg daily for 5 days. ? ?Use compression stockings and elevate your leg. ? ? I have referred you to vascular surgery for follow-up. ? ?Return to ER if you have worse leg swelling, numbness to your toes and toes turning blue ? ? ? ?

## 2022-01-14 NOTE — ED Provider Notes (Signed)
?[facility] ?Provider Note ? ? ?CSN: [id number] ?Arrival date & time: 01/14/22  1331 ? ?  ? ?History ? ?Chief Complaint  ?Patient presents with  ? decreased blood flow leg  ? ? ?Deanna Myers is a 31 y.o. female here with left leg swelling.  Patient had recent appendectomy and has noted left leg swelling.  Patient was seen here 3 days ago for the same thing.  Patient had negative DVT study at that time.  Patient states that she has been trying to elevate her leg but then the swelling persisted.  Saw PCP today and noted to have poor pulses so was sent here for vascular evaluation.  Patient states that she has some numbness in the left leg but denies any chest pain or shortness of breath. ? ?The history is provided by the patient.  ? ?  ? ?Home Medications ?Prior to Admission medications   ?Medication Sig Start Date End Date Taking? Authorizing Provider  ?acetaminophen (TYLENOL) 500 MG tablet Take 1,000 mg by mouth every 6 (six) hours as needed for moderate pain or headache.    [provider]  ?Cholecalciferol (VITAMIN D-3 PO) Take 1 tablet by mouth daily. Unsure of how many units    [provider]  ?ibuprofen (ADVIL) 200 MG tablet Take 400 mg by mouth every 6 (six) hours as needed for headache or moderate pain.    [provider]  ?ondansetron (ZOFRAN) 4 MG tablet Take 1 tablet (4 mg total) by mouth every 8 (eight) hours as needed for nausea or vomiting. 09/10/21   [name], DO  ?oxyCODONE (OXY IR/ROXICODONE) 5 MG immediate release tablet Take 1 tablet (5 mg total) by mouth every 6 (six) hours as needed for severe pain. 12/21/21   Meuth, Brooke A, PA-C  ?rizatriptan (MAXALT) 10 MG tablet Take 1 tablet earliest onset of headache.  May repeat in 2 hours if needed.  Maximum 2 tablets in 24 hours. 09/10/21   [name], DO  ?albuterol (PROVENTIL HFA;VENTOLIN HFA) 108 (90 BASE) MCG/ACT inhaler Inhale 1-2 puffs into the lungs every 6 (six) hours as  needed for wheezing or shortness of breath.  05/21/20  [provider]  ?omeprazole (PRILOSEC) 20 MG capsule Take 1 capsule (20 mg total) by mouth 2 (two) times daily. ?Patient not taking: Reported on 02/10/2018 07/03/17 09/18/19  [name], MD  ?SUMAtriptan (IMITREX) 100 MG tablet Take 1 tablet earliest onset of migraine.  May repeat once in 2 hours if headache persists or recurs.  Do not exceed 2 tablets in 24 hours 02/10/18 05/21/20  [name], DO  ?   ? ?Allergies    ?Shellfish allergy, Adhesive [tape], Latex, and Betadine [povidone iodine]   ? ?Review of Systems   ?Review of Systems  ?Musculoskeletal:   ?     L leg swelling   ?All other systems reviewed and are negative. ? ?Physical Exam ?Updated Vital Signs ?BP 117/75 (BP Location: Right Arm)   Pulse 92   Temp 98.5 ?F (36.9 ?C) (Oral)   Resp 20   SpO2 100%  ?Physical Exam ?Vitals and nursing note reviewed.  ?HENT:  ?   Head: Normocephalic.  ?   Nose: Nose normal.  ?   Mouth/Throat:  ?   Mouth: Mucous membranes are moist.  ?Eyes:  ?   Extraocular Movements: Extraocular movements intact.  ?   Pupils: Pupils are equal, round, and reactive to light.  ?Cardiovascular:  ?  Rate and Rhythm: Normal rate.  ?   Pulses: Normal pulses.  ?Pulmonary:  ?   Effort: Pulmonary effort is normal.  ?   Breath sounds: Normal breath sounds.  ?Abdominal:  ?   General: Abdomen is flat.  ?   Palpations: Abdomen is soft.  ?Musculoskeletal:  ?   Cervical back: Normal range of motion and neck supple.  ?   Comments: Left leg swollen and there is some calf tenderness.  Patient does have diminished pulse on the left side but was able to palpate a good DP pulse.  Patient does have diminished capillary refill.  Patient was able to wiggle toes but I was able to obtain a good popliteal pulse as well  ?Skin: ?   Capillary Refill: Capillary refill takes less than 2 seconds.  ?Neurological:  ?   General: No focal deficit present.  ?   Mental Status: She is alert and oriented to  person, place, and time.  ?Psychiatric:     ?   Mood and Affect: Mood normal.     ?   Behavior: Behavior normal.  ? ? ?ED Results / Procedures / Treatments   ?Labs ?(all labs ordered are listed, but only abnormal results are displayed) ?Labs Reviewed - No data to display ? ?EKG ?None ? ?Radiology ?No results found. ? ?Procedures ?Procedures  ? ? ?Medications Ordered in ED ?Medications  ?furosemide (LASIX) tablet 40 mg (has no administration in time range)  ? ? ?ED Course/ Medical Decision Making/ A&P ?  ?                        ?Medical Decision Making ?Deanna Myers is a 31 y.o. female here presenting with left leg swelling.  Patient has been having left leg swelling for the last several days.  She just recently had surgery.  She had a negative DVT study several days ago.  I think she does have dependent edema and at this point.  She does have a good DP pulse.  The leg is colder compared to the right.  I think this is all secondary to her leg swelling.  I do not think she has a vascular emergency currently.  In particular I do not think she has claudication or arterial occlusion.  I recommend that she start on Lasix for diuresis and try compression stockings and leg elevation.  I do not think she needs an emergent vascular evaluation and can follow-up with vascular surgery outpatient ? ? ?Problems Addressed: ?Leg swelling: acute illness or injury ? ?Amount and/or Complexity of Data Reviewed ?External Data Reviewed: labs. ? ?Risk ?Prescription drug management. ? ?Final Clinical Impression(s) / ED Diagnoses ?Final diagnoses:  ?None  ? ? ?Rx / DC Orders ?ED Discharge Orders   ? ? None  ? ?  ? ? ?  ?Charlynne Pander, MD ?01/14/22 1505 ? ?

## 2022-01-14 NOTE — ED Notes (Signed)
Patient verbalizes understanding of discharge instructions. Opportunity for questioning and answers were provided. Patient discharged from ED.  °

## 2022-01-14 NOTE — ED Triage Notes (Signed)
Sent by PCP due to veins being constricted and ligaments that swollen. Both are affecting blood flow. PCP spoke with vascular surgery and told pt to come to ER now. ? ?Pt reports left pain.  ?

## 2022-02-10 ENCOUNTER — Other Ambulatory Visit: Payer: Self-pay | Admitting: *Deleted

## 2022-02-10 DIAGNOSIS — M7989 Other specified soft tissue disorders: Secondary | ICD-10-CM

## 2022-02-18 NOTE — Progress Notes (Signed)
Office Note     CC: History of left lower extremity edema Requesting Provider:  [facility]  HPI: Deanna Myers is a 31 y.o. (1991-02-17) female who presents at the request of [facility] for evaluation of left lower extremity edema.  Deanna Myers is a 77 year old mother of 55 (66-year-old, 59-year-old girls) who recently underwent appendectomy.  Postoperatively, she appreciated swelling in the left lower extremity which was concerning for DVT.  She presented to the ED for further evaluation.  Imaging at that time demonstrated no thrombus in the left leg.  She was asked to follow-up with vascular surgery for venous insufficiency work-up.  On exam today, she was doing well with no complaints.  The left lower extremity swelling had dissipated.  She denied pain, weakness, or difficulty with ambulation.   Past Medical History:  Diagnosis Date   ADHD (attention deficit hyperactivity disorder)    since childhood   Asthma    inhaler last used prior to pregnancy   BV (bacterial vaginosis)    Chlamydia    GERD (gastroesophageal reflux disease)    Gonorrhea    Hyperemesis    G3    Past Surgical History:  Procedure Laterality Date   CESAREAN SECTION N/A 12/21/2015   Procedure: CESAREAN SECTION;  Surgeon: [name], MD;  Location: [facility];  Service: Obstetrics;  Laterality: N/A;   LAPAROSCOPIC APPENDECTOMY N/A 12/21/2021   Procedure: APPENDECTOMY LAPAROSCOPIC;  Surgeon: Fritzi Mandes, MD;  Location: [facility];  Service: General;  Laterality: N/A;   NO PAST SURGERIES      Social History   Socioeconomic History   Marital status: Single    Spouse name: Not on file   Number of children: 2   Years of education: Not on file   Highest education level: Some college, no degree  Occupational History   Occupation: warehouse  Tobacco Use   Smoking status: Never   Smokeless tobacco: Never  Substance and Sexual Activity   Alcohol use: Yes    Comment:  social   Drug use: No   Sexual activity: Not on file  Other Topics Concern   Not on file  Social History Narrative   Pt is right handed, lives with her 2 daughters in a 1 story story home. Rarely drinks caffeine. Is very active.   Social Determinants of Health   Financial Resource Strain: Not on file  Food Insecurity: Not on file  Transportation Needs: Not on file  Physical Activity: Not on file  Stress: Not on file  Social Connections: Not on file  Intimate Partner Violence: Not on file   Family History  Problem Relation Age of Onset   Hypertension Father    Hypertension Mother    Kidney disease Mother    Cervical cancer Paternal Aunt    Congestive Heart Failure Maternal Grandmother    Breast cancer Paternal Grandfather     Current Outpatient Medications  Medication Sig Dispense Refill   acetaminophen (TYLENOL) 500 MG tablet Take 1,000 mg by mouth every 6 (six) hours as needed for moderate pain or headache.     Cholecalciferol (VITAMIN D-3 PO) Take 1 tablet by mouth daily. Unsure of how many units     furosemide (LASIX) 40 MG tablet Take 1 tablet (40 mg total) by mouth daily. 5 tablet 0   ibuprofen (ADVIL) 200 MG tablet Take 400 mg by mouth every 6 (six) hours as needed for headache or moderate pain.  ondansetron (ZOFRAN) 4 MG tablet Take 1 tablet (4 mg total) by mouth every 8 (eight) hours as needed for nausea or vomiting. 20 tablet 5   oxyCODONE (OXY IR/ROXICODONE) 5 MG immediate release tablet Take 1 tablet (5 mg total) by mouth every 6 (six) hours as needed for severe pain. 15 tablet 0   rizatriptan (MAXALT) 10 MG tablet Take 1 tablet earliest onset of headache.  May repeat in 2 hours if needed.  Maximum 2 tablets in 24 hours. 10 tablet 5   traMADol (ULTRAM) 50 MG tablet Take 1 tablet (50 mg total) by mouth every 6 (six) hours as needed. 10 tablet 0   No current facility-administered medications for this visit.    Allergies  Allergen Reactions   Shellfish Allergy  Anaphylaxis   Adhesive [Tape] Swelling and Other (See Comments)    Reaction:  Bruising    Latex Hives   Betadine [Povidone Iodine] Rash     REVIEW OF SYSTEMS:   denotes positive finding,  denotes negative finding Cardiac  Comments:  Chest pain or chest pressure:    Shortness of breath upon exertion:    Short of breath when lying flat:    Irregular heart rhythm:        Vascular    Pain in calf, thigh, or hip brought on by ambulation:    Pain in feet at night that wakes you up from your sleep:     Blood clot in your veins:    Leg swelling:         Pulmonary    Oxygen at home:    Productive cough:     Wheezing:         Neurologic    Sudden weakness in arms or legs:     Sudden numbness in arms or legs:     Sudden onset of difficulty speaking or slurred speech:    Temporary loss of vision in one eye:     Problems with dizziness:         Gastrointestinal    Blood in stool:     Vomited blood:         Genitourinary    Burning when urinating:     Blood in urine:        Psychiatric    Major depression:         Hematologic    Bleeding problems:    Problems with blood clotting too easily:        Skin    Rashes or ulcers:        Constitutional    Fever or chills:      PHYSICAL EXAMINATION:  There were no vitals filed for this visit.  General:  WDWN in NAD; vital signs documented above Gait: Not observed HENT: WNL, normocephalic Pulmonary: normal non-labored breathing , without Rales, rhonchi,  wheezing Cardiac: regular HR, without   Abdomen: soft, NT, no masses Skin: without rashes Vascular Exam/Pulses:  Right Left  Radial 2+ (normal) 2+ (normal)  Ulnar    Femoral    Popliteal    DP 2+ (normal) 2+ (normal)  PT     Extremities: without ischemic changes, without Gangrene , without cellulitis; without open wounds;  Musculoskeletal: no muscle wasting or atrophy Right and left calf circumference is measured at 45 and 46 cm respectively  Neurologic:  A&O X 3;  No focal weakness or paresthesias are detected Psychiatric:  The pt has Normal affect.   Non-Invasive Vascular Imaging:   IMPRESSION:  1. No evidence of DVT within the left lower extremity. 2. Minimal amount of subcutaneous edema about the medial aspect the left ankle.  Left:  - No evidence of deep vein thrombosis seen in the left lower extremity,  from the common femoral through the popliteal veins.  - No evidence of superficial venous thrombosis in the left lower  extremity.  - No evidence of superficial venous reflux seen in the left greater  saphenous vein.  - No evidence of superficial venous reflux seen in the left short  saphenous vein.  - Limited imaging of the abdomen (patient was not NPO). Showed patent left  common iliac vein with augmentable signal distally. Cannot rule out  proximal left common iliac artery extrinsic compression.       ASSESSMENT/PLAN:: 31 y.o. female presenting with history of left lower extremity swelling after recent appendectomy.  The swelling has resolved.  Imaging was reviewed demonstrating no DVT, no venous insufficiency.  On physical exam, Astrid had palpable pulses in bilateral lower extremities.  Calf circumference was nearly symmetric.  She is asymptomatic at this time.  I had a long conversation with Grainne regarding the above.  Both arterial and vascular systems look normal.  I told her I am available should she have any questions or concerns and that she could follow-up with me as needed.   Victorino Sparrow, MD [facility] [phone]

## 2022-02-19 ENCOUNTER — Ambulatory Visit (HOSPITAL_COMMUNITY)
Admission: RE | Admit: 2022-02-19 | Discharge: 2022-02-19 | Disposition: A | Discharge status: Home / Self Care | Payer: Medicaid Other | Source: Ambulatory Visit | Attending: Vascular Surgery | Admitting: Vascular Surgery

## 2022-02-19 ENCOUNTER — Ambulatory Visit (INDEPENDENT_AMBULATORY_CARE_PROVIDER_SITE_OTHER): Payer: Medicaid Other | Admitting: Vascular Surgery

## 2022-02-19 ENCOUNTER — Encounter: Payer: Self-pay | Admitting: Vascular Surgery

## 2022-02-19 VITALS — BP 118/73 | HR 93 | Temp 98.4°F | Resp 20 | Ht 66.0 in | Wt 233.0 lb

## 2022-02-19 DIAGNOSIS — M7989 Other specified soft tissue disorders: Secondary | ICD-10-CM

## 2022-02-24 ENCOUNTER — Ambulatory Visit
Admission: EM | Admit: 2022-02-24 | Discharge: 2022-02-24 | Disposition: A | Discharge status: Home / Self Care | Payer: Medicaid Other | Attending: Internal Medicine | Admitting: Internal Medicine

## 2022-02-24 ENCOUNTER — Encounter: Payer: Self-pay | Admitting: Emergency Medicine

## 2022-02-24 DIAGNOSIS — L02412 Cutaneous abscess of left axilla: Secondary | ICD-10-CM

## 2022-02-24 DIAGNOSIS — M545 Low back pain, unspecified: Secondary | ICD-10-CM | POA: Diagnosis not present

## 2022-02-24 MED ORDER — DOXYCYCLINE HYCLATE 100 MG PO CAPS: 100.0000 mg | ORAL_CAPSULE | Freq: Two times a day (BID) | ORAL | 0 refills | Status: DC

## 2022-02-24 MED ORDER — KETOROLAC TROMETHAMINE 30 MG/ML IJ SOLN
30.0000 mg | Freq: Once | INTRAMUSCULAR | Status: AC
Start: 2022-02-24 — End: 2022-02-24
  Administered 2022-02-24: 30 mg via INTRAMUSCULAR
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
## 2022-02-24 NOTE — Discharge Instructions (Signed)
You have been prescribed an antibiotic to treat the abscess of your left armpit.  Please also use warm compresses to affected area.  You have been given an injection today to help alleviate your pain.  Please alternate ice and heat to affected area as well.  Do not take any ibuprofen, Advil, Aleve for at least 24 hours following injection.  Follow-up if symptoms persist or worsen.

## 2022-02-26 ENCOUNTER — Ambulatory Visit: Payer: Self-pay

## 2022-02-26 ENCOUNTER — Ambulatory Visit
Admission: EM | Admit: 2022-02-26 | Discharge: 2022-02-26 | Disposition: A | Discharge status: Home / Self Care | Payer: Medicaid Other | Attending: Urgent Care | Admitting: Urgent Care

## 2022-02-26 DIAGNOSIS — M79622 Pain in left upper arm: Secondary | ICD-10-CM | POA: Diagnosis not present

## 2022-02-26 DIAGNOSIS — L02412 Cutaneous abscess of left axilla: Secondary | ICD-10-CM

## 2022-02-26 DIAGNOSIS — M545 Low back pain, unspecified: Secondary | ICD-10-CM | POA: Diagnosis not present

## 2022-02-26 MED ORDER — MELOXICAM 15 MG PO TABS: 15.0000 mg | ORAL_TABLET | Freq: Every day | ORAL | 1 refills | Status: DC

## 2022-02-26 MED ORDER — TIZANIDINE HCL 4 MG PO TABS: 4.0000 mg | ORAL_TABLET | Freq: Every day | ORAL | 0 refills | Status: DC

## 2022-02-26 MED ORDER — PREDNISONE 20 MG PO TABS: ORAL_TABLET | ORAL | 0 refills | Status: DC

## 2022-02-26 MED ORDER — KETOROLAC TROMETHAMINE 60 MG/2ML IM SOLN
60.0000 mg | Freq: Once | INTRAMUSCULAR | Status: AC
Start: 2022-02-26 — End: 2022-02-26
  Administered 2022-02-26: 60 mg via INTRAMUSCULAR

## 2022-02-26 NOTE — Discharge Instructions (Addendum)
Please change your dressing 3-5 times daily. Do not apply any ointments or creams. Each time you change your dressing, make sure that you are pressing on the wound to get pus to come out.  Try your best to have a family member help you clean gently around the perimeter of the wound with gentle soap and warm water. Pat your wound dry and let it air out if possible to make sure it is dry before reapplying another dressing.   Continue doxycycline for the infection.
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
## 2022-02-26 NOTE — ED Notes (Addendum)
Patient returned to urgent care stating her pain was worse and requesting to know what other medications we could give her for pain. Patient made aware of additional pain medication sent to her pharmacy and new order added for one time dose of Toradol. Patient educated on medication and time adjustments for pain medications.
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
## 2022-03-22 ENCOUNTER — Encounter (HOSPITAL_BASED_OUTPATIENT_CLINIC_OR_DEPARTMENT_OTHER): Payer: Self-pay

## 2022-03-22 ENCOUNTER — Other Ambulatory Visit: Payer: Self-pay

## 2022-03-22 ENCOUNTER — Emergency Department (HOSPITAL_BASED_OUTPATIENT_CLINIC_OR_DEPARTMENT_OTHER)
Admission: EM | Admit: 2022-03-22 | Discharge: 2022-03-23 | Disposition: A | Discharge status: Home / Self Care | Payer: Medicaid Other | Attending: Emergency Medicine | Admitting: Emergency Medicine

## 2022-03-22 ENCOUNTER — Emergency Department (HOSPITAL_BASED_OUTPATIENT_CLINIC_OR_DEPARTMENT_OTHER): Payer: Medicaid Other | Admitting: Radiology

## 2022-03-22 DIAGNOSIS — M25552 Pain in left hip: Secondary | ICD-10-CM | POA: Insufficient documentation

## 2022-03-22 DIAGNOSIS — M546 Pain in thoracic spine: Secondary | ICD-10-CM | POA: Insufficient documentation

## 2022-03-22 DIAGNOSIS — M549 Dorsalgia, unspecified: Secondary | ICD-10-CM

## 2022-03-22 DIAGNOSIS — Y92410 Unspecified street and highway as the place of occurrence of the external cause: Secondary | ICD-10-CM | POA: Insufficient documentation

## 2022-03-22 DIAGNOSIS — M545 Low back pain, unspecified: Secondary | ICD-10-CM | POA: Diagnosis not present

## 2022-03-22 DIAGNOSIS — M542 Cervicalgia: Secondary | ICD-10-CM | POA: Insufficient documentation

## 2022-03-22 DIAGNOSIS — Z91040 Latex allergy status: Secondary | ICD-10-CM | POA: Insufficient documentation

## 2022-03-22 LAB — PREGNANCY, URINE: Preg Test, Ur: NEGATIVE

## 2022-03-22 IMAGING — CT CT CHEST-ABD-PELV W/ CM
2 of 4 series · 14 of 46 positions shown, 16 images · IV contrast (omnipaque)
Comparison: Chest radiograph 11/21/2010

CLINICAL DATA: Lump in right breast for 2 days

EXAM:
CT CHEST, ABDOMEN, AND PELVIS WITH CONTRAST
TECHNIQUE: Multidetector CT imaging of the chest, abdomen and pelvis was
performed following the standard protocol during bolus
administration of intravenous contrast.
CONTRAST:  100mL OMNIPAQUE IOHEXOL 300 MG/ML  SOLN

[Series 3: cap with · axial · 0.96mm/px · z∈[+811,+1356]mm · 11 of 129 slices shown, 13 images]
[im 10/129  soft-tissue]
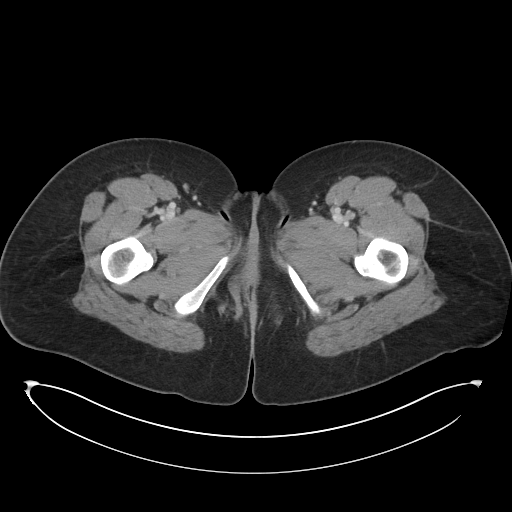
[im 10/129  bone]
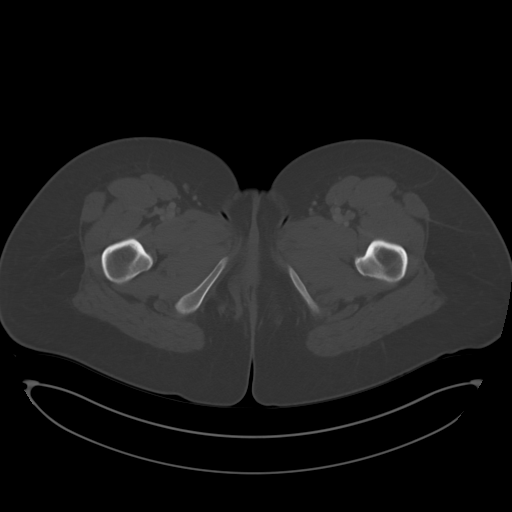
[im 19/129  soft-tissue]
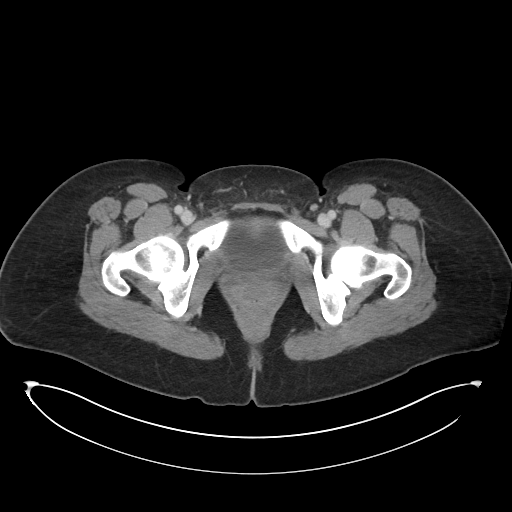
[im 28/129  soft-tissue]
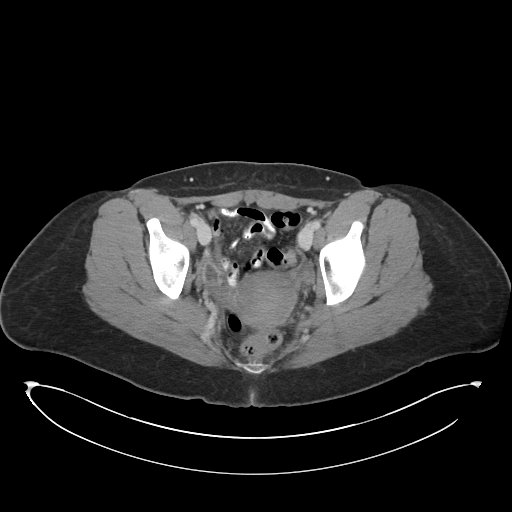
[im 46/129  soft-tissue]
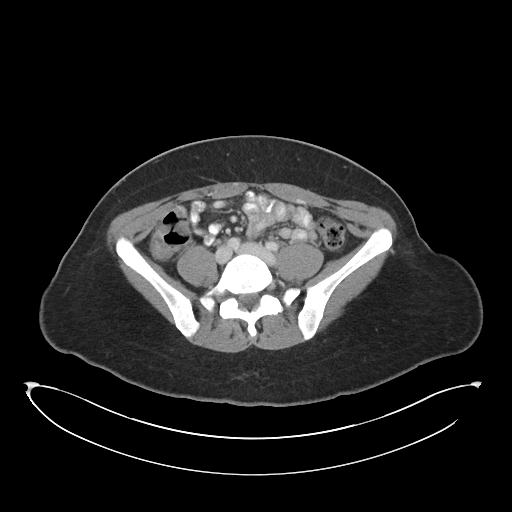
[im 55/129  soft-tissue]
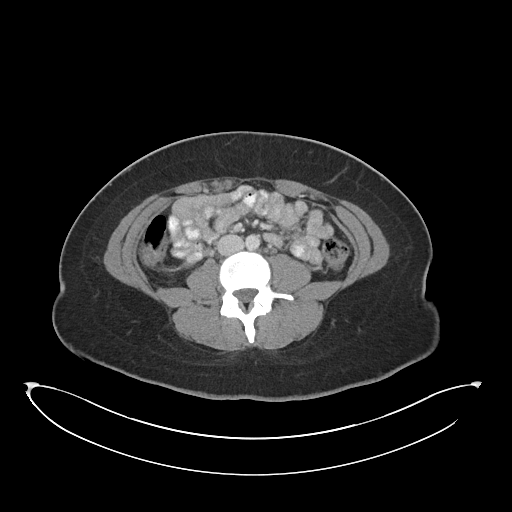
[im 65/129  soft-tissue]
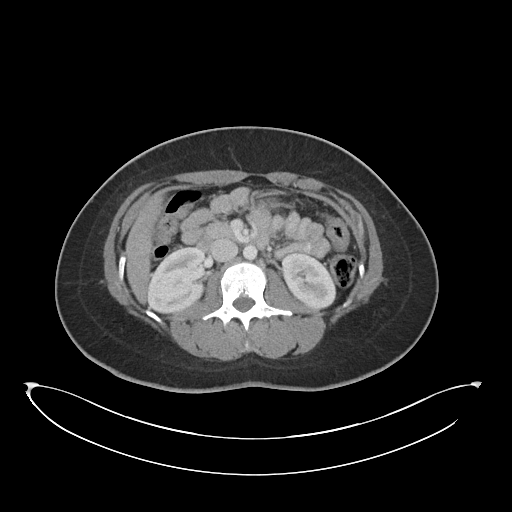
[im 74/129  soft-tissue]
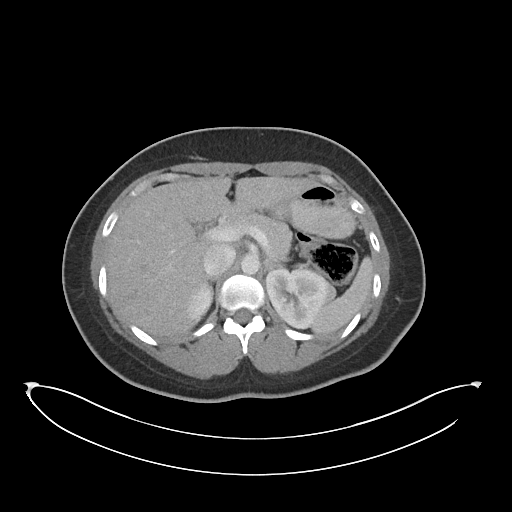
[im 83/129  soft-tissue]
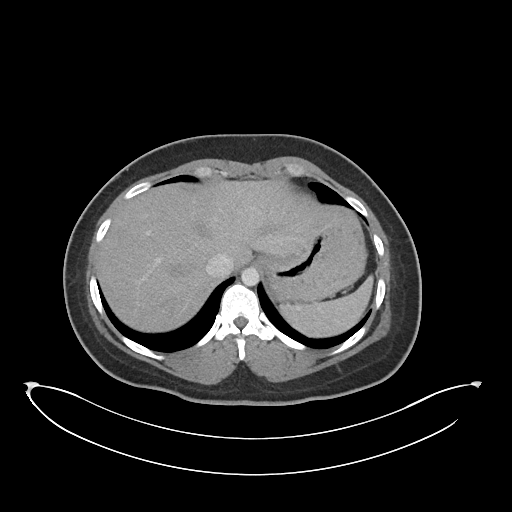
[im 101/129  soft-tissue]
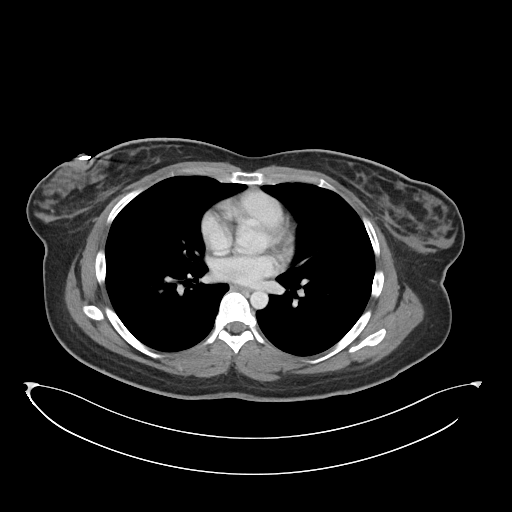
[im 101/129  bone]
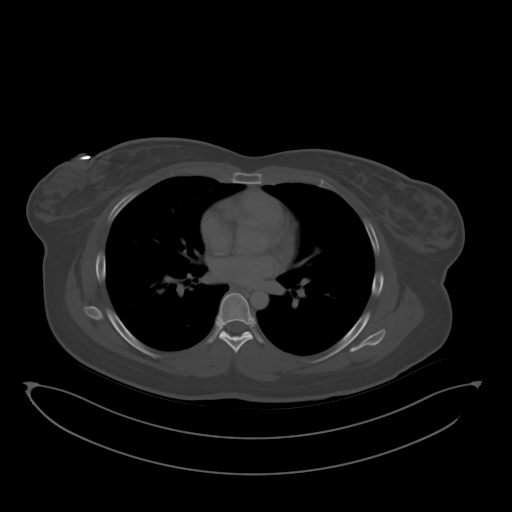
[im 110/129  soft-tissue]
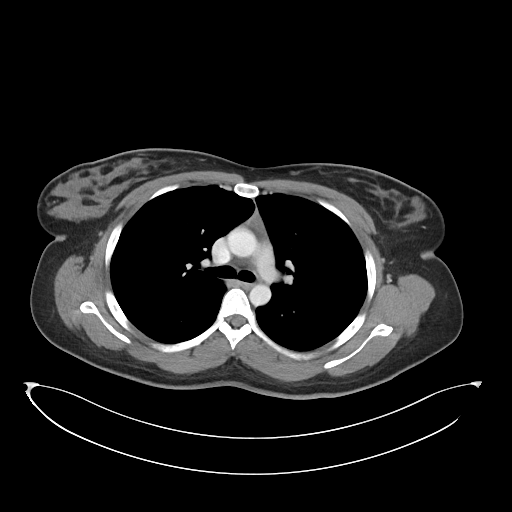
[im 119/129  soft-tissue]
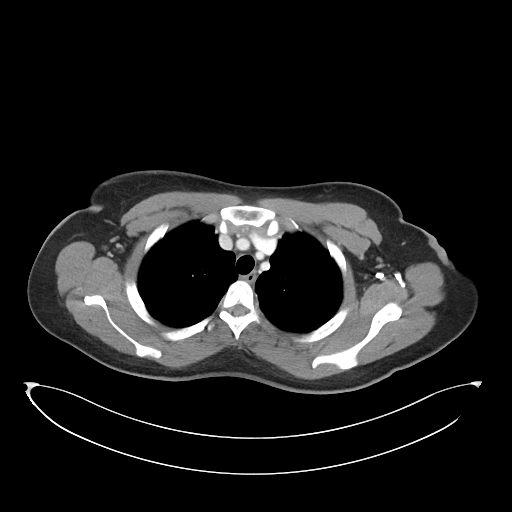

[Series 6: coronal · coronal · 0.99mm/px · 3 of 101 slices shown]
[im 34/101  soft-tissue]
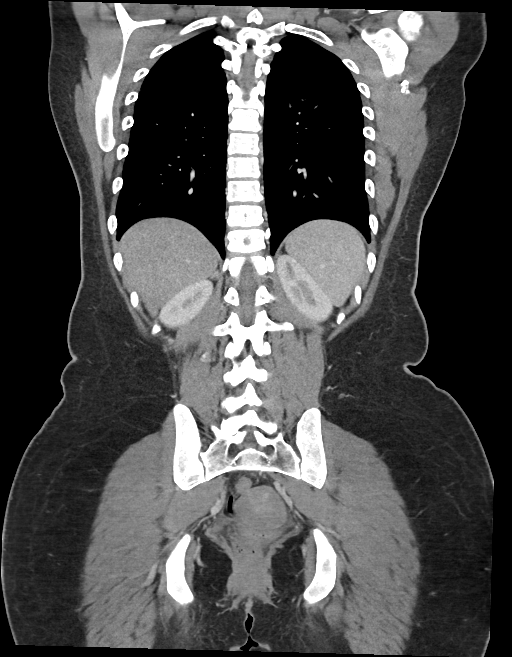
[im 45/101  soft-tissue]
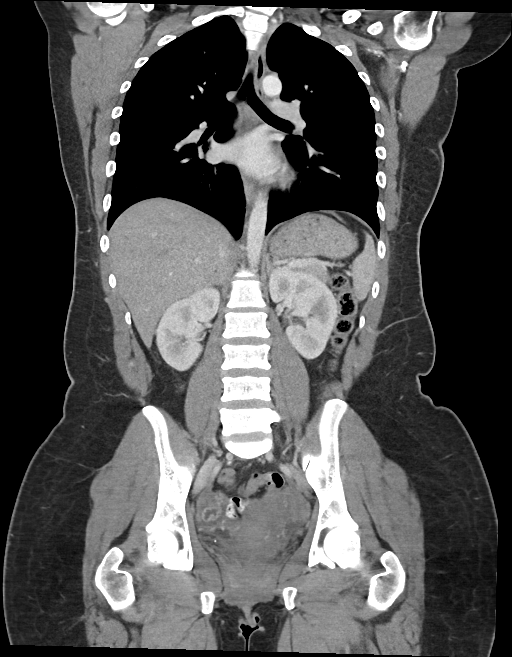
[im 56/101  soft-tissue]
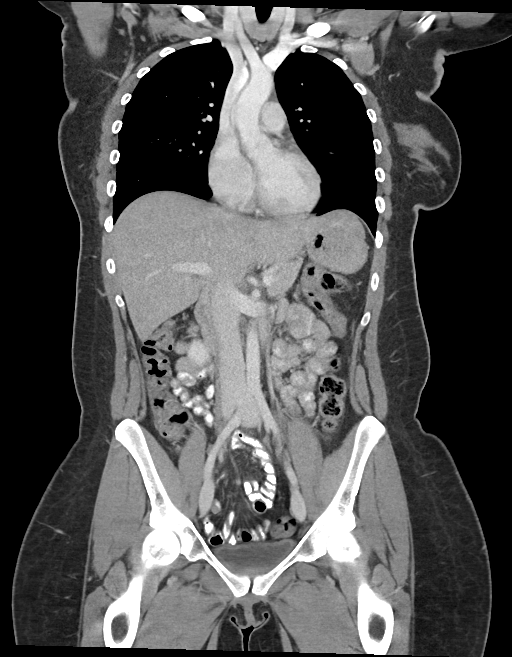

[14 of 46 positions shown; findings below may reference images not displayed]

FINDINGS: CT CHEST FINDINGS

Cardiovascular: Normal heart size. No pericardial effusion. The
aortic root is suboptimally assessed given cardiac pulsation
artifact. The aorta is normal caliber. No acute luminal abnormality
of the imaged aorta. No periaortic stranding or hemorrhage. Left
vertebral artery arises directly from the aortic arch. Central
pulmonary arteries are normal caliber. No large central filling
defects. More distal evaluation limited by a non tailored
examination.

Mediastinum/Nodes: Wedge-shaped soft tissue across the anterior
mediastinum. No mediastinal fluid or gas. Normal thyroid gland and
thoracic inlet. No acute abnormality of the trachea or esophagus. No
worrisome mediastinal, hilar or axillary adenopathy.

Lungs/Pleura: Airways are patent. No consolidation, features of
edema, pneumothorax, or effusion. No suspicious pulmonary nodules or
masses.

Musculoskeletal: Very minimal asymmetry of the breast tissue. No
discrete focal soft tissue mass or lesion is seen. No overt skin
thickening isn't readily apparent by CT imaging. Remaining chest
wall soft tissues are unremarkable. No acute fracture or traumatic
osseous injury of the chest wall, spine or imaged shoulders.

CT ABDOMEN PELVIS FINDINGS

Hepatobiliary: No worrisome focal liver lesions. Smooth liver
surface contour. Normal hepatic attenuation. Normal gallbladder and
biliary tree.

Pancreas: No pancreatic ductal dilatation or surrounding
inflammatory changes.

Spleen: Normal in size. No concerning splenic lesions.

Adrenals/Urinary Tract: Normal adrenals. Kidneys are normally
located with symmetric enhancement. No suspicious renal lesion,
urolithiasis or hydronephrosis. Mild bladder wall thickening may be
related to underdistention without other visible acute bladder
abnormality such as calculi or debris.

Stomach/Bowel: Distal esophagus, stomach and duodenal sweep are
unremarkable. No small bowel wall thickening or dilatation. No
evidence of obstruction. A normal appendix is visualized. No colonic
dilatation or wall thickening. Moderate colonic stool burden

Vascular/Lymphatic: No significant vascular findings are present. No
enlarged abdominal or pelvic lymph nodes.

Reproductive: Retroverted uterus. Thick-walled peripherally
enhancing cystic structure in the right adnexa compatible with a
collapsing corpus luteum. No concerning adnexal mass or lesion. A
normal physiologic finding in a reproductive age female.

Other: No abdominopelvic free fluid or free gas. No bowel containing
hernias.

Musculoskeletal: Musculature is normal and symmetric. No acute
osseous abnormality or suspicious osseous lesion.
IMPRESSION: No discrete skin thickening, mass or collection is seen within the
right breast soft tissues. Some minimal asymmetry of the glandular
tissue in the breast may be within physiologic normal. If there is
persisting clinical concern, recommend referral to a dedicated
outpatient breast [REDACTED] for further evaluation and
management.

Moderate colonic stool burden. Could correlate for features of
constipation.

Normal appendix.

Collapsing corpus luteum in the right adnexa. No follow-up imaging
recommended. Note: This recommendation does not apply to
premenarchal patients and to those with increased risk (genetic,
family history, elevated tumor markers or other high-risk factors)
of ovarian cancer. Reference: JACR [DATE]):[id number]

Mild bladder wall thickening may be related to underdistention
though could correlate for urinary symptoms and with urinalysis as
appropriate.

Wedge-shaped soft tissue attenuation in the anterior mediastinum is
most likely reflective of thymic remnant in a patient of this age
given location, configuration, and absence of surrounding fat
stranding or other adjacent traumatic features.

## 2022-03-22 MED ORDER — METHOCARBAMOL 500 MG PO TABS: 500.0000 mg | ORAL_TABLET | Freq: Two times a day (BID) | ORAL | 0 refills | Status: DC

## 2022-03-22 MED ORDER — OXYCODONE-ACETAMINOPHEN 5-325 MG PO TABS
2.0000 | ORAL_TABLET | Freq: Once | ORAL | Status: AC
  Administered 2022-03-22: 2 via ORAL
  Filled 2022-03-22: qty 2

## 2022-03-22 MED ORDER — KETOROLAC TROMETHAMINE 30 MG/ML IJ SOLN
30.0000 mg | Freq: Once | INTRAMUSCULAR | Status: AC
  Administered 2022-03-22: 30 mg via INTRAMUSCULAR
  Filled 2022-03-22: qty 1

## 2022-03-22 MED ORDER — NAPROXEN 500 MG PO TABS: 500.0000 mg | ORAL_TABLET | Freq: Two times a day (BID) | ORAL | 0 refills | Status: DC

## 2022-03-22 MED ORDER — METHOCARBAMOL 500 MG PO TABS
500.0000 mg | ORAL_TABLET | Freq: Once | ORAL | Status: AC
  Administered 2022-03-22: 500 mg via ORAL
  Filled 2022-03-22: qty 1

## 2022-03-22 NOTE — ED Notes (Signed)
Pt is aware urine is needed prior to x-rays being taken.
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
## 2022-03-22 NOTE — ED Notes (Signed)
Patient transported to X-ray 

## 2022-03-22 NOTE — ED Provider Notes (Signed)
Trays without any acute findings.  Patient stable for discharge.   Vanetta Mulders, MD 03/22/22 2312

## 2022-03-22 NOTE — ED Notes (Signed)
Pt was given a hot pack for comfort
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
## 2022-03-23 NOTE — ED Notes (Signed)
Pt refused discharged vitals.

## 2022-04-01 ENCOUNTER — Telehealth: Payer: Self-pay

## 2022-04-01 ENCOUNTER — Encounter: Payer: Self-pay | Admitting: Nurse Practitioner

## 2022-04-01 ENCOUNTER — Ambulatory Visit (INDEPENDENT_AMBULATORY_CARE_PROVIDER_SITE_OTHER): Payer: Medicaid Other | Admitting: Nurse Practitioner

## 2022-04-01 VITALS — BP 104/55 | HR 95 | Temp 97.6°F | Ht 66.0 in | Wt 231.4 lb

## 2022-04-01 DIAGNOSIS — M79605 Pain in left leg: Secondary | ICD-10-CM

## 2022-04-01 DIAGNOSIS — Z0000 Encounter for general adult medical examination without abnormal findings: Secondary | ICD-10-CM | POA: Diagnosis not present

## 2022-04-01 DIAGNOSIS — F419 Anxiety disorder, unspecified: Secondary | ICD-10-CM

## 2022-04-01 DIAGNOSIS — F32A Depression, unspecified: Secondary | ICD-10-CM

## 2022-04-01 DIAGNOSIS — R600 Localized edema: Secondary | ICD-10-CM

## 2022-04-01 DIAGNOSIS — R109 Unspecified abdominal pain: Secondary | ICD-10-CM | POA: Diagnosis not present

## 2022-04-01 DIAGNOSIS — G8929 Other chronic pain: Secondary | ICD-10-CM

## 2022-04-01 MED ORDER — DICLOFENAC SODIUM 75 MG PO TBEC: 75.0000 mg | DELAYED_RELEASE_TABLET | Freq: Two times a day (BID) | ORAL | 0 refills | Status: DC

## 2022-04-01 NOTE — Patient Instructions (Signed)
1. Healthcare maintenance  - POCT URINALYSIS DIP (CLINITEK) - CBC - Comprehensive metabolic panel - Ambulatory referral to Orthopedic Surgery  2. Chronic abdominal pain  - Ambulatory referral to Gastroenterology - CBC - Comprehensive metabolic panel - Ambulatory referral to Orthopedic Surgery  3. Anxiety and depression  - Ambulatory referral to Psychiatry  4. Left leg pain  - diclofenac (VOLTAREN) 75 MG EC tablet; Take 1 tablet (75 mg total) by mouth 2 (two) times daily.  Dispense: 30 tablet; Refill: 0 - Ambulatory referral to Orthopedic Surgery  5. Leg edema, left  - Ambulatory referral to Vascular Surgery  Follow up:  Follow up in 3 months or sooner if needed

## 2022-04-01 NOTE — Assessment & Plan Note (Signed)
-   POCT URINALYSIS DIP (CLINITEK) - CBC - Comprehensive metabolic panel - Ambulatory referral to Orthopedic Surgery  2. Chronic abdominal pain  - Ambulatory referral to Gastroenterology - CBC - Comprehensive metabolic panel - Ambulatory referral to Orthopedic Surgery  3. Anxiety and depression  - Ambulatory referral to Psychiatry  4. Left leg pain  - diclofenac (VOLTAREN) 75 MG EC tablet; Take 1 tablet (75 mg total) by mouth 2 (two) times daily.  Dispense: 30 tablet; Refill: 0 - Ambulatory referral to Orthopedic Surgery  5. Leg edema, left  - Ambulatory referral to Vascular Surgery  Follow up:  Follow up in 3 months or sooner if needed
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
## 2022-04-02 ENCOUNTER — Other Ambulatory Visit: Payer: Self-pay | Admitting: Nurse Practitioner

## 2022-04-02 DIAGNOSIS — M79605 Pain in left leg: Secondary | ICD-10-CM

## 2022-04-02 LAB — CBC
Hematocrit: 37.4 % (ref 34.0–46.6)
Hemoglobin: 12.3 g/dL (ref 11.1–15.9)
MCH: 27.5 pg (ref 26.6–33.0)
MCHC: 32.9 g/dL (ref 31.5–35.7)
MCV: 84 fL (ref 79–97)
Platelets: 358 10*3/uL (ref 150–450)
RBC: 4.47 x10E6/uL (ref 3.77–5.28)
RDW: 14.2 % (ref 11.7–15.4)
WBC: 6.8 10*3/uL (ref 3.4–10.8)

## 2022-04-02 LAB — COMPREHENSIVE METABOLIC PANEL
ALT: 29 IU/L (ref 0–32)
AST: 32 IU/L (ref 0–40)
Albumin/Globulin Ratio: 1.4 (ref 1.2–2.2)
Albumin: 4.6 g/dL (ref 3.9–5.0)
Alkaline Phosphatase: 58 IU/L (ref 44–121)
BUN/Creatinine Ratio: 13 (ref 9–23)
BUN: 8 mg/dL (ref 6–20)
Bilirubin Total: 0.3 mg/dL (ref 0.0–1.2)
CO2: 26 mmol/L (ref 20–29)
Calcium: 9.5 mg/dL (ref 8.7–10.2)
Chloride: 100 mmol/L (ref 96–106)
Creatinine, Ser: 0.63 mg/dL (ref 0.57–1.00)
Globulin, Total: 3.2 g/dL (ref 1.5–4.5)
Glucose: 67 mg/dL — ABNORMAL LOW (ref 70–99)
Potassium: 4.4 mmol/L (ref 3.5–5.2)
Sodium: 138 mmol/L (ref 134–144)
Total Protein: 7.8 g/dL (ref 6.0–8.5)
eGFR: 123 mL/min/{1.73_m2} (ref >59)

## 2022-04-02 MED ORDER — BACLOFEN 10 MG PO TABS: 10.0000 mg | ORAL_TABLET | Freq: Two times a day (BID) | ORAL | 0 refills | Status: AC

## 2022-04-02 NOTE — Telephone Encounter (Signed)
I decided to switch this to baclofen which is a different drug class. Hopefully it will be covered.

## 2022-04-07 ENCOUNTER — Ambulatory Visit: Payer: Medicaid Other | Admitting: Orthopaedic Surgery

## 2022-04-07 ENCOUNTER — Encounter: Payer: Self-pay | Admitting: Orthopaedic Surgery

## 2022-04-07 DIAGNOSIS — M5441 Lumbago with sciatica, right side: Secondary | ICD-10-CM | POA: Diagnosis not present

## 2022-04-07 DIAGNOSIS — R202 Paresthesia of skin: Secondary | ICD-10-CM

## 2022-04-07 DIAGNOSIS — M5442 Lumbago with sciatica, left side: Secondary | ICD-10-CM

## 2022-04-07 DIAGNOSIS — G8929 Other chronic pain: Secondary | ICD-10-CM

## 2022-04-07 MED ORDER — METHOCARBAMOL 500 MG PO TABS: 500.0000 mg | ORAL_TABLET | Freq: Two times a day (BID) | ORAL | 2 refills | Status: DC | PRN

## 2022-04-07 MED ORDER — PREDNISONE 10 MG (21) PO TBPK: ORAL_TABLET | ORAL | 0 refills | Status: DC
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
## 2022-04-09 ENCOUNTER — Ambulatory Visit (HOSPITAL_COMMUNITY)
Admission: RE | Admit: 2022-04-09 | Discharge: 2022-04-09 | Disposition: A | Discharge status: Home / Self Care | Payer: Medicaid Other | Source: Ambulatory Visit | Attending: Nurse Practitioner | Admitting: Nurse Practitioner

## 2022-04-09 DIAGNOSIS — M79605 Pain in left leg: Secondary | ICD-10-CM | POA: Diagnosis present

## 2022-04-09 DIAGNOSIS — R600 Localized edema: Secondary | ICD-10-CM

## 2022-04-09 NOTE — Progress Notes (Signed)
Left lower extremity venous duplex has been completed. Preliminary results can be found in CV Proc through chart review.  Results were given to Angus Seller NP.  04/09/22 1:06 PM Olen Cordial RVT

## 2022-04-12 ENCOUNTER — Telehealth: Payer: Self-pay

## 2022-04-12 ENCOUNTER — Ambulatory Visit: Payer: Medicaid Other | Admitting: Neurology

## 2022-04-12 NOTE — Telephone Encounter (Signed)
-----   Message from Deanna Andrew, NP sent at 04/12/2022  8:08 AM EDT ----- Please call to let patient know that her [name] of legs was clear.

## 2022-04-12 NOTE — Telephone Encounter (Signed)
I left a message for the patient to return my call.

## 2022-04-15 ENCOUNTER — Telehealth: Payer: Self-pay | Admitting: Neurology

## 2022-04-15 NOTE — Telephone Encounter (Signed)
LMOVM for the patient to give the office a call back.
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
## 2022-04-16 ENCOUNTER — Ambulatory Visit (INDEPENDENT_AMBULATORY_CARE_PROVIDER_SITE_OTHER): Payer: Medicaid Other | Admitting: Clinical

## 2022-04-16 DIAGNOSIS — F419 Anxiety disorder, unspecified: Secondary | ICD-10-CM

## 2022-04-16 DIAGNOSIS — F32A Depression, unspecified: Secondary | ICD-10-CM | POA: Diagnosis not present
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
## 2022-04-20 ENCOUNTER — Ambulatory Visit: Payer: Medicaid Other | Admitting: Neurology

## 2022-04-20 NOTE — Therapy (Signed)
OUTPATIENT PHYSICAL THERAPY THORACOLUMBAR EVALUATION   Patient Name: Deanna Myers MRN: [id number] DOB:[birth year], 31 y.o., female Today's Date: 04/20/2022    Past Medical History:  Diagnosis Date   ADHD (attention deficit hyperactivity disorder)    since childhood   Asthma    inhaler last used prior to pregnancy   BV (bacterial vaginosis)    Chlamydia    GERD (gastroesophageal reflux disease)    Gonorrhea    Hyperemesis    G3   Past Surgical History:  Procedure Laterality Date   CESAREAN SECTION N/A 12/21/2015   Procedure: CESAREAN SECTION;  Surgeon: [name], MD;  Location: [facility];  Service: Obstetrics;  Laterality: N/A;   LAPAROSCOPIC APPENDECTOMY N/A 12/21/2021   Procedure: APPENDECTOMY LAPAROSCOPIC;  Surgeon: Fritzi Mandes, MD;  Location: [facility] OR;  Service: General;  Laterality: N/A;   Patient Active Problem List   Diagnosis Date Noted   Healthcare maintenance 04/01/2022   Active labor at term 12/21/2015   UTI (urinary tract infection) 06/13/2015   Pyelonephritis affecting pregnancy in first trimester 06/12/2015   Folliculitis 02/13/2013    PCP: Ivonne Andrew, NP  REFERRING PROVIDER: Cristie Hem, PA-C  REFERRING DIAG: Bilateral leg paresthesia, Chronic bilateral low back pain with bilateral sciatica  Rationale for Evaluation and Treatment Rehabilitation  THERAPY DIAG:  No diagnosis found.  ONSET DATE: ***   SUBJECTIVE:              SUBJECTIVE STATEMENT: ***  PERTINENT HISTORY:  ***  PAIN:  Are you having pain? Yes:  NPRS scale: ***/10 Pain location: *** Pain description: *** Aggravating factors: *** Relieving factors: ***  PRECAUTIONS: {Therapy precautions:24002}  WEIGHT BEARING RESTRICTIONS {Yes ***/No:24003}  FALLS:  Has patient fallen in last 6 months? {fallsyesno:27318}  LIVING ENVIRONMENT: Lives with: {[facility] lives with:25569::"lives with their family"} Lives in: {Lives in:25570} Stairs: {opstairs:27293} Has  following equipment at home: {Assistive devices:23999}  OCCUPATION: ***  PLOF: Independent  PATIENT GOALS: ***   OBJECTIVE:  DIAGNOSTIC FINDINGS:  ***  PATIENT SURVEYS:  Modified Oswestry ***   SCREENING FOR RED FLAGS: ***  COGNITION: Overall cognitive status: Within functional limits for tasks assessed     SENSATION: {sensation:27233}  MUSCLE LENGTH: ***  POSTURE:   ***  PALPATION: ***  LUMBAR ROM:   {AROM/PROM:27142}  A/PROM  eval  Flexion   Extension   Right lateral flexion   Left lateral flexion   Right rotation   Left rotation    LOWER EXTREMITY ROM:     {AROM/PROM:27142}  Right eval Left eval  Hip flexion    Hip extension    Hip abduction    Hip adduction    Hip internal rotation    Hip external rotation    Knee flexion    Knee extension    Ankle dorsiflexion    Ankle plantarflexion    Ankle inversion    Ankle eversion     LOWER EXTREMITY MMT:    MMT Right eval Left eval  Hip flexion    Hip extension    Hip abduction    Knee flexion    Knee extension    Ankle dorsiflexion    Ankle plantarflexion    Ankle inversion    Ankle eversion     (Blank rows = not tested)  LUMBAR SPECIAL TESTS:  {lumbar special test:25242}  FUNCTIONAL TESTS:  {Functional tests:24029}  GAIT: Distance walked: *** Assistive device utilized: {Assistive devices:23999} Level of assistance: {Levels of assistance:24026} Comments: ***  TODAY'S TREATMENT  ***   PATIENT EDUCATION:  Education details: Exam findings, POC, HEP Person educated: Patient Education method: Explanation, Demonstration, Tactile cues, Verbal cues, and Handouts Education comprehension: verbalized understanding, returned demonstration, verbal cues required, tactile cues required, and needs further education  HOME EXERCISE PROGRAM: ***   ASSESSMENT: CLINICAL IMPRESSION: Patient is a 31 y.o. female who was seen today for physical therapy evaluation and treatment for ***.     OBJECTIVE IMPAIRMENTS {opptimpairments:25111}.   ACTIVITY LIMITATIONS {activitylimitations:27494}  PARTICIPATION LIMITATIONS: {participationrestrictions:25113}  PERSONAL FACTORS {Personal factors:25162} are also affecting patient's functional outcome.   REHAB POTENTIAL: {rehabpotential:25112}  CLINICAL DECISION MAKING: {clinical decision making:25114}  EVALUATION COMPLEXITY: {Evaluation complexity:25115}   GOALS: Goals reviewed with patient? Yes  SHORT TERM GOALS: Target date: {follow up:25551}  Patient will be I with initial HEP in order to progress with therapy. Baseline: Goal status: INITIAL  2.  *** Baseline:  Goal status: INITIAL  3.  *** Baseline:  Goal status: INITIAL  LONG TERM GOALS: Target date: {follow up:25551}  Patient will be I with final HEP to maintain progress from PT. Baseline:  Goal status: INITIAL  2.  *** Baseline:  Goal status: INITIAL  3.  *** Baseline:  Goal status: INITIAL  4.  *** Baseline:  Goal status: INITIAL   PLAN: PT FREQUENCY: {rehab frequency:25116}  PT DURATION: {rehab duration:25117}  PLANNED INTERVENTIONS: {rehab planned interventions:25118::"Therapeutic exercises","Therapeutic activity","Neuromuscular re-education","Balance training","Gait training","Patient/Family education","Self Care","Joint mobilization"}.  PLAN FOR NEXT SESSION: Patient tolerated therapy well with no adverse effects, ***   Rosana Hoes, PT, DPT, LAT, ATC 04/20/22  1:35 PM Phone: [phone] Fax: [phone]

## 2022-04-21 ENCOUNTER — Other Ambulatory Visit: Payer: Self-pay

## 2022-04-21 ENCOUNTER — Ambulatory Visit: Payer: Medicaid Other | Attending: Physician Assistant | Admitting: Physical Therapy

## 2022-04-21 ENCOUNTER — Encounter: Payer: Self-pay | Admitting: Physical Therapy

## 2022-04-21 DIAGNOSIS — G8929 Other chronic pain: Secondary | ICD-10-CM | POA: Diagnosis not present

## 2022-04-21 DIAGNOSIS — R202 Paresthesia of skin: Secondary | ICD-10-CM | POA: Diagnosis not present

## 2022-04-21 DIAGNOSIS — M5441 Lumbago with sciatica, right side: Secondary | ICD-10-CM | POA: Insufficient documentation

## 2022-04-21 DIAGNOSIS — M5442 Lumbago with sciatica, left side: Secondary | ICD-10-CM | POA: Insufficient documentation

## 2022-04-21 DIAGNOSIS — M6281 Muscle weakness (generalized): Secondary | ICD-10-CM | POA: Insufficient documentation

## 2022-04-21 DIAGNOSIS — M5459 Other low back pain: Secondary | ICD-10-CM | POA: Insufficient documentation

## 2022-04-21 NOTE — Patient Instructions (Signed)
Access Code: [email or web address] URL: [email or web address] Date: 04/21/2022 Prepared by: Rosana Hoes  Exercises - Supine Lower Trunk Rotation  - 1-2 x daily - 10 reps - 5 seconds hold - Hooklying Single Knee to Chest Stretch  - 1-2 x daily - 3 reps - 20 seconds hold - Supine Posterior Pelvic Tilt  - 1-2 x daily - 10 reps - 5 seconds hold - Straight Leg Raise  - 1-2 x daily - 2 sets - 5 reps - Bridge  - 1-2 x daily - 2 sets - 5 reps - Clam with Resistance  - 1-2 x daily - 2 sets - 5 reps - Cat Cow  - 1-2 x daily - 5 reps - 5 seconds hold

## 2022-04-23 ENCOUNTER — Ambulatory Visit (INDEPENDENT_AMBULATORY_CARE_PROVIDER_SITE_OTHER): Payer: Medicaid Other | Admitting: Clinical

## 2022-04-23 DIAGNOSIS — F419 Anxiety disorder, unspecified: Secondary | ICD-10-CM

## 2022-04-23 DIAGNOSIS — F32A Depression, unspecified: Secondary | ICD-10-CM
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
## 2022-04-26 ENCOUNTER — Other Ambulatory Visit: Payer: Self-pay | Admitting: Nurse Practitioner

## 2022-04-26 DIAGNOSIS — G8929 Other chronic pain: Secondary | ICD-10-CM

## 2022-04-27 ENCOUNTER — Encounter: Payer: Self-pay | Admitting: Physical Therapy

## 2022-04-27 ENCOUNTER — Ambulatory Visit: Payer: Medicaid Other | Admitting: Physical Therapy

## 2022-04-27 ENCOUNTER — Other Ambulatory Visit: Payer: Self-pay

## 2022-04-27 DIAGNOSIS — M5459 Other low back pain: Secondary | ICD-10-CM

## 2022-04-27 DIAGNOSIS — M6281 Muscle weakness (generalized): Secondary | ICD-10-CM

## 2022-04-27 NOTE — Therapy (Signed)
OUTPATIENT PHYSICAL THERAPY TREATMENT NOTE   Patient Name: Deanna Myers MRN: [id number] DOB:October 28, 1990, 31 y.o., female Today's Date: 04/27/2022  PCP: Ivonne Andrew, NP   REFERRING PROVIDER: Cristie Hem, PA-C   END OF SESSION:   PT End of Session - 04/27/22 1623     Visit Number 2    Number of Visits 8    Date for PT Re-Evaluation 06/16/22    Authorization Type [employer]    Authorization Time Period 04/26/2022 - 06/25/2022    Authorization - Visit Number 1    Authorization - Number of Visits 10    PT Start Time 1621    PT Stop Time 1700    PT Time Calculation (min) 39 min    Activity Tolerance Patient tolerated treatment well    Behavior During Therapy WFL for tasks assessed/performed             Past Medical History:  Diagnosis Date   ADHD (attention deficit hyperactivity disorder)    since childhood   Asthma    inhaler last used prior to pregnancy   BV (bacterial vaginosis)    Chlamydia    GERD (gastroesophageal reflux disease)    Gonorrhea    Hyperemesis    G3   Past Surgical History:  Procedure Laterality Date   CESAREAN SECTION N/A 12/21/2015   Procedure: CESAREAN SECTION;  Surgeon: [name], MD;  Location: [facility];  Service: Obstetrics;  Laterality: N/A;   LAPAROSCOPIC APPENDECTOMY N/A 12/21/2021   Procedure: APPENDECTOMY LAPAROSCOPIC;  Surgeon: Fritzi Mandes, MD;  Location: [facility] OR;  Service: General;  Laterality: N/A;   Patient Active Problem List   Diagnosis Date Noted   Healthcare maintenance 04/01/2022   Active labor at term 12/21/2015   UTI (urinary tract infection) 06/13/2015   Pyelonephritis affecting pregnancy in first trimester 06/12/2015   Folliculitis 02/13/2013    REFERRING DIAG: Bilateral leg paresthesia, Chronic bilateral low back pain with bilateral sciatica  THERAPY DIAG:  Other low back pain  Muscle weakness (generalized)  Rationale for Evaluation and Treatment Rehabilitation  PERTINENT HISTORY:  None  PRECAUTIONS: None   SUBJECTIVE: Patient reports she is doing better. She reports exercise are good, she is able to do them consistently.   PAIN:  Are you having pain? Yes:  NPRS scale: 7/10 Pain location: Low back Pain description: Constant, pulling, stabbing Aggravating factors: Bending forward, moving around too much Relieving factors: Hot baths with Epson salt   OBJECTIVE: (objective measures completed at initial evaluation unless otherwise dated) PATIENT SURVEYS:  Modified Oswestry 42% disability    MUSCLE LENGTH: Hamstring flexibility deficit   POSTURE:  Rounded shoulder, increased lumbar lordosis, knee valgus   PALPATION: Tender to palpation bilateral lumbar paraspinals   LUMBAR ROM:    Active  A/PROM  eval  Flexion 75%  Extension 50%  Right lateral flexion WFL  Left lateral flexion WFL  Right rotation 50%  Left rotation 50%  Patient reports low back pain worse with all active motion   LOWER EXTREMITY MMT:     MMT Right eval Left eval  Hip flexion 4 4  Hip extension 3 3  Hip abduction 4- 4-  Knee flexion 4 4  Knee extension 4 4  Ankle dorsiflexion 5 5    FUNCTIONAL TESTS:  Patient unable to hold front plank   GAIT: Assistive device utilized: None Level of assistance: Complete Independence Comments:  grossly WFL     TODAY'S TREATMENT  [facility] Adult PT Treatment:  DATE: 04/27/22 Therapeutic Exercise: Recumbent bike L3 x 3 min while taking subjective LTR 5 x 5 sec each Piriformis stretch 2 x 20 sec each Modified thomas stretch with passive knee flexion 2 x 30 sec each Supine passive hamstring stretch 2 x 30 sec each Cat cow 10 x 5 sec Child's pose stretch 2 x 20 sec Bridge 10 x 5 sec - focus on posterior pelvic tilt 90-90 table top hold 2 x 5 with 5 sec hold Clamshell with red x 15 each   [facility] Adult PT Treatment:                                                DATE: 04/21/22 Therapeutic  Exercise: LTR 5 x 5 seconds Hooklying SKTC stretch 2 x 20 seconds each Posterior pelvic tilt 5 x 5 sec SLR x 5 each Bridge partial range x 5  Clamshell with yellow x 5 each Cat cow 5 x 5 sec   PATIENT EDUCATION:  Education details: HEP Person educated: Patient Education method: Consulting civil engineer, Demonstration, Corporate treasurer cues, Verbal cues Education comprehension: verbalized understanding, returned demonstration, verbal cues required, tactile cues required, and needs further education   HOME EXERCISE PROGRAM: Access Code: [id number]     ASSESSMENT: CLINICAL IMPRESSION: Patient tolerated therapy well with no adverse effects. Therapy focused on continued flexibility and lumbar mobility, and progression of core and hip strengthening. She tolerated progression in her stretching well, she did report feeling tighter on the left side this visit. She was able to progress with core stabilization and resistance for hip strengthening, she did require cueing for lumbopelvic control to avoid excessive lordosis. No changes to HEP but she was provided red band for increased resistance. Patient would benefit from continued skilled PT to progress her mobility and strength in order to reduce pain and maximize functional ability.      OBJECTIVE IMPAIRMENTS decreased activity tolerance, decreased ROM, decreased strength, increased muscle spasms, impaired flexibility, postural dysfunction, and pain.    ACTIVITY LIMITATIONS carrying, lifting, bending, standing, dressing, hygiene/grooming, and locomotion level   PARTICIPATION LIMITATIONS: meal prep, cleaning, shopping, community activity, and occupation   [place] and Time since onset of injury/illness/exacerbation are also affecting patient's functional outcome.      GOALS: Goals reviewed with patient? Yes   SHORT TERM GOALS: Target date: 05/19/2022   Patient will be I with initial HEP in order to progress with therapy. Baseline: HEP provided at  eval Goal status: INITIAL   2.  Patient will demonstrate lumbar AROM grossly WFL in order to improve ability to bend forward for dressing and grooming tasks Baseline: patient exhibits limitations in lumbar AROM with increased pain Goal status: INITIAL   3.  Patient will report pain level </= 4/10 in order to improve tolerance for work related tasks and reduce functional limitations Baseline: 7/10 pain Goal status: INITIAL   LONG TERM GOALS: Target date: 06/16/2022   Patient will be I with final HEP to maintain progress from PT. Baseline: HEP provided at eval Goal status: INITIAL   2.  Patient will report Modified Oswestry </= 20% in order to indicate improved functional ability Baseline: 42% Goal status: INITIAL   3.  Patient will demonstrate improve core/hip strength >/= 4/5 MMT and knee strength = 5/5 MMT in order to improve her standing and walking tolerance so she can perform better  at work Baseline: patient exhibits gross core/hip and knee strength deficits Goal status: INITIAL   4.  Patient will report pain level </= 2/10 with all activities in order to all return to prior level of activity and exercise without limitations Baseline: 7/10 pain at eval Goal status: INITIAL     PLAN: PT FREQUENCY: 1x/week   PT DURATION: 8 weeks   PLANNED INTERVENTIONS: Therapeutic exercises, Therapeutic activity, Neuromuscular re-education, Balance training, Gait training, Patient/Family education, Self Care, Joint mobilization, Joint manipulation, Aquatic Therapy, Dry Needling, Electrical stimulation, Spinal manipulation, Spinal mobilization, Cryotherapy, Moist heat, Manual therapy, and Re-evaluation.   PLAN FOR NEXT SESSION: Patient tolerated therapy well with no adverse effects, manual/dry needling for lumbar region as indicated, lumbar mobility, progress core and hip strengthening, initiate lifting as able, postural control and strengthening    Rosana Hoes, PT, DPT, LAT,  ATC 04/27/22  5:06 PM Phone: [phone] Fax: [phone]

## 2022-05-03 NOTE — Therapy (Signed)
OUTPATIENT PHYSICAL THERAPY TREATMENT NOTE   Patient Name: Deanna Myers MRN: [id number] DOB:07/13/1991, 31 y.o., female Today's Date: 05/04/2022  PCP: Ivonne Andrew, NP   REFERRING PROVIDER: Cristie Hem, PA-C   END OF SESSION:   PT End of Session - 05/04/22 1628     Visit Number 3    Number of Visits 8    Date for PT Re-Evaluation 06/16/22    Authorization Type [employer]    Authorization Time Period 04/26/2022 - 06/25/2022    Authorization - Visit Number 2    Authorization - Number of Visits 10    PT Start Time 1625    PT Stop Time 1700    PT Time Calculation (min) 35 min    Activity Tolerance Patient tolerated treatment well    Behavior During Therapy WFL for tasks assessed/performed              Past Medical History:  Diagnosis Date   ADHD (attention deficit hyperactivity disorder)    since childhood   Asthma    inhaler last used prior to pregnancy   BV (bacterial vaginosis)    Chlamydia    GERD (gastroesophageal reflux disease)    Gonorrhea    Hyperemesis    G3   Past Surgical History:  Procedure Laterality Date   CESAREAN SECTION N/A 12/21/2015   Procedure: CESAREAN SECTION;  Surgeon: [name], MD;  Location: [facility];  Service: Obstetrics;  Laterality: N/A;   LAPAROSCOPIC APPENDECTOMY N/A 12/21/2021   Procedure: APPENDECTOMY LAPAROSCOPIC;  Surgeon: Fritzi Mandes, MD;  Location: [facility];  Service: General;  Laterality: N/A;   Patient Active Problem List   Diagnosis Date Noted   Healthcare maintenance 04/01/2022   Active labor at term 12/21/2015   UTI (urinary tract infection) 06/13/2015   Pyelonephritis affecting pregnancy in first trimester 06/12/2015   Folliculitis 02/13/2013    REFERRING DIAG: Bilateral leg paresthesia, Chronic bilateral low back pain with bilateral sciatica  THERAPY DIAG:  Other low back pain  Muscle weakness (generalized)  Rationale for Evaluation and Treatment Rehabilitation  PERTINENT HISTORY:  None  PRECAUTIONS: None   SUBJECTIVE: Patient reports she is doing pretty good. She has been feeling better, and she is consistent with her workouts and stretching.  PAIN:  Are you having pain? Yes:  NPRS scale: 5/10 Pain location: Low back Pain description: Constant, pulling, stabbing Aggravating factors: Bending forward, moving around too much Relieving factors: Hot baths with Epson salt   OBJECTIVE: (objective measures completed at initial evaluation unless otherwise dated) PATIENT SURVEYS:  Modified Oswestry 42% disability    MUSCLE LENGTH: Hamstring flexibility deficit   POSTURE:  Rounded shoulder, increased lumbar lordosis, knee valgus   PALPATION: Tender to palpation bilateral lumbar paraspinals   LUMBAR ROM:    Active  A/PROM  eval AROM 05/04/2022  Flexion 75% WFL  Extension 50% WFL  Right lateral flexion WFL   Left lateral flexion WFL   Right rotation 50%   Left rotation 50%   Patient reports low back pain worse with all active motion   LOWER EXTREMITY MMT:     MMT Right eval Left eval  Hip flexion 4 4  Hip extension 3 3  Hip abduction 4- 4-  Knee flexion 4 4  Knee extension 4 4  Ankle dorsiflexion 5 5    FUNCTIONAL TESTS:  Patient unable to hold front plank   GAIT: Assistive device utilized: None Level of assistance: Complete Independence Comments:  grossly [facility]  TODAY'S TREATMENT  [facility] Adult PT Treatment:                                                DATE: 05/04/22 Therapeutic Exercise: Recumbent bike L3 x 3 min while taking subjective Bridge 2 x 10 with 5 sec 90-90 alternating heel tap 2 x 5 each Sidelying hip abduction 2 x 10 each Cat cow 5 x 5 sec Bird dog x 5 each Dead lift 30# from 4" box 2 x 10 Standing springboard bar pulldown with yellow 2 x 10   [facility] Adult PT Treatment:                                                DATE: 04/27/22 Therapeutic Exercise: Recumbent bike L3 x 3 min while taking subjective LTR 5 x 5 sec  each Piriformis stretch 2 x 20 sec each Modified thomas stretch with passive knee flexion 2 x 30 sec each Supine passive hamstring stretch 2 x 30 sec each Cat cow 10 x 5 sec Child's pose stretch 2 x 20 sec Bridge 10 x 5 sec - focus on posterior pelvic tilt 90-90 table top hold 2 x 5 with 5 sec hold Clamshell with red x 15 each  [facility] Adult PT Treatment:                                                DATE: 04/21/22 Therapeutic Exercise: LTR 5 x 5 seconds Hooklying SKTC stretch 2 x 20 seconds each Posterior pelvic tilt 5 x 5 sec SLR x 5 each Bridge partial range x 5  Clamshell with yellow x 5 each Cat cow 5 x 5 sec   PATIENT EDUCATION:  Education details: HEP Person educated: Patient Education method: Programmer, multimedia, Demonstration, Actor cues, Verbal cues Education comprehension: verbalized understanding, returned demonstration, verbal cues required, tactile cues required, and needs further education   HOME EXERCISE PROGRAM: Access Code: [email or web address]     ASSESSMENT: CLINICAL IMPRESSION: Patient tolerated therapy well with no adverse effects. Therapy limited due to patient arriving late. She does exhibit improved lumbar motion this visit. Therapy focused on progress of hip and core strength and stabilization exercises. Incorporated lifting this visit with good tolerance, patient did require frequent cueing for proper technique and lumbopelvic control. No changes to HEP this visit. Patient would benefit from continued skilled PT to progress her mobility and strength in order to reduce pain and maximize functional ability.      OBJECTIVE IMPAIRMENTS decreased activity tolerance, decreased ROM, decreased strength, increased muscle spasms, impaired flexibility, postural dysfunction, and pain.    ACTIVITY LIMITATIONS carrying, lifting, bending, standing, dressing, hygiene/grooming, and locomotion level   PARTICIPATION LIMITATIONS: meal prep, cleaning, shopping, community activity, and  occupation   PERSONAL FACTORS [place] and Time since onset of injury/illness/exacerbation are also affecting patient's functional outcome.      GOALS: Goals reviewed with patient? Yes   SHORT TERM GOALS: Target date: 05/19/2022   Patient will be I with initial HEP in order to progress with therapy. Baseline: HEP provided at eval Goal status: INITIAL  2.  Patient will demonstrate lumbar AROM grossly WFL in order to improve ability to bend forward for dressing and grooming tasks Baseline: patient exhibits limitations in lumbar AROM with increased pain Goal status: INITIAL   3.  Patient will report pain level </= 4/10 in order to improve tolerance for work related tasks and reduce functional limitations Baseline: 7/10 pain Goal status: INITIAL   LONG TERM GOALS: Target date: 06/16/2022   Patient will be I with final HEP to maintain progress from PT. Baseline: HEP provided at eval Goal status: INITIAL   2.  Patient will report Modified Oswestry </= 20% in order to indicate improved functional ability Baseline: 42% Goal status: INITIAL   3.  Patient will demonstrate improve core/hip strength >/= 4/5 MMT and knee strength = 5/5 MMT in order to improve her standing and walking tolerance so she can perform better at work Baseline: patient exhibits gross core/hip and knee strength deficits Goal status: INITIAL   4.  Patient will report pain level </= 2/10 with all activities in order to all return to prior level of activity and exercise without limitations Baseline: 7/10 pain at eval Goal status: INITIAL     PLAN: PT FREQUENCY: 1x/week   PT DURATION: 8 weeks   PLANNED INTERVENTIONS: Therapeutic exercises, Therapeutic activity, Neuromuscular re-education, Balance training, Gait training, Patient/Family education, Self Care, Joint mobilization, Joint manipulation, Aquatic Therapy, Dry Needling, Electrical stimulation, Spinal manipulation, Spinal mobilization, Cryotherapy, Moist  heat, Manual therapy, and Re-evaluation.   PLAN FOR NEXT SESSION: Patient tolerated therapy well with no adverse effects, manual/dry needling for lumbar region as indicated, lumbar mobility, progress core and hip strengthening, initiate lifting as able, postural control and strengthening    Rosana Hoes, PT, DPT, LAT, ATC 05/04/22  5:02 PM Phone: [phone] Fax: [phone]

## 2022-05-04 ENCOUNTER — Ambulatory Visit: Payer: Medicaid Other | Attending: Physician Assistant | Admitting: Physical Therapy

## 2022-05-04 ENCOUNTER — Encounter: Payer: Self-pay | Admitting: Physical Therapy

## 2022-05-04 ENCOUNTER — Other Ambulatory Visit: Payer: Self-pay

## 2022-05-04 DIAGNOSIS — M6281 Muscle weakness (generalized): Secondary | ICD-10-CM | POA: Diagnosis present

## 2022-05-04 DIAGNOSIS — M5459 Other low back pain: Secondary | ICD-10-CM | POA: Diagnosis present

## 2022-05-07 ENCOUNTER — Ambulatory Visit (INDEPENDENT_AMBULATORY_CARE_PROVIDER_SITE_OTHER): Payer: Medicaid Other | Admitting: Clinical

## 2022-05-07 DIAGNOSIS — F32A Depression, unspecified: Secondary | ICD-10-CM | POA: Diagnosis not present

## 2022-05-07 DIAGNOSIS — F419 Anxiety disorder, unspecified: Secondary | ICD-10-CM | POA: Diagnosis not present
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
## 2022-05-10 NOTE — Therapy (Signed)
OUTPATIENT PHYSICAL THERAPY TREATMENT NOTE   Patient Name: Deanna Myers MRN: [id number] DOB:[birth year], 31 y.o., female Today's Date: 05/11/2022  PCP: Ivonne Andrew, NP   REFERRING PROVIDER: Cristie Hem, PA-C   END OF SESSION:   PT End of Session - 05/11/22 1629     Visit Number 4    Number of Visits 8    Date for PT Re-Evaluation 06/16/22    Authorization Type [employer]    Authorization Time Period 04/26/2022 - 06/25/2022    Authorization - Visit Number 3    Authorization - Number of Visits 10    PT Start Time 1622    PT Stop Time 1700    PT Time Calculation (min) 38 min    Activity Tolerance Patient tolerated treatment well    Behavior During Therapy WFL for tasks assessed/performed               Past Medical History:  Diagnosis Date   ADHD (attention deficit hyperactivity disorder)    since childhood   Asthma    inhaler last used prior to pregnancy   BV (bacterial vaginosis)    Chlamydia    GERD (gastroesophageal reflux disease)    Gonorrhea    Hyperemesis    G3   Past Surgical History:  Procedure Laterality Date   CESAREAN SECTION N/A 12/21/2015   Procedure: CESAREAN SECTION;  Surgeon: [name], MD;  Location: [facility];  Service: Obstetrics;  Laterality: N/A;   LAPAROSCOPIC APPENDECTOMY N/A 12/21/2021   Procedure: APPENDECTOMY LAPAROSCOPIC;  Surgeon: Fritzi Mandes, MD;  Location: [facility] OR;  Service: General;  Laterality: N/A;   Patient Active Problem List   Diagnosis Date Noted   Healthcare maintenance 04/01/2022   Active labor at term 12/21/2015   UTI (urinary tract infection) 06/13/2015   Pyelonephritis affecting pregnancy in first trimester 06/12/2015   Folliculitis 02/13/2013    REFERRING DIAG: Bilateral leg paresthesia, Chronic bilateral low back pain with bilateral sciatica  THERAPY DIAG:  Other low back pain  Muscle weakness (generalized)  Rationale for Evaluation and Treatment Rehabilitation  PERTINENT HISTORY:  None  PRECAUTIONS: None   SUBJECTIVE: Patient reports she is doing well, she is feeling more limber. She has begun taking more holistic medications.  PAIN:  Are you having pain? Yes:  NPRS scale: 4/10 Pain location: Low back Pain description: Constant, pulling, stabbing Aggravating factors: Bending forward, moving around too much Relieving factors: Hot baths with Epson salt   OBJECTIVE: (objective measures completed at initial evaluation unless otherwise dated) PATIENT SURVEYS:  Modified Oswestry 42% disability    MUSCLE LENGTH: Hamstring flexibility deficit   POSTURE:  Rounded shoulder, increased lumbar lordosis, knee valgus   PALPATION: Tender to palpation bilateral lumbar paraspinals   LUMBAR ROM:    Active  A/PROM  eval AROM 05/04/2022  Flexion 75% WFL  Extension 50% WFL  Right lateral flexion WFL   Left lateral flexion WFL   Right rotation 50%   Left rotation 50%   Patient reports low back pain worse with all active motion   LOWER EXTREMITY MMT:     MMT Right eval Left eval  Hip flexion 4 4  Hip extension 3 3  Hip abduction 4- 4-  Knee flexion 4 4  Knee extension 4 4  Ankle dorsiflexion 5 5    FUNCTIONAL TESTS:  Patient unable to hold front plank 05/11/2022: patient able to hold modified front plank on knees for 20 seconds with proper form   GAIT:  Assistive device utilized: None Level of assistance: Complete Independence Comments:  grossly WFL     TODAY'S TREATMENT  [facility] Adult PT Treatment:                                                DATE: 05/11/22 Therapeutic Exercise: Recumbent bike L3 x 3 min while taking subjective LTR 5 x 5 sec Bridge x 10 with 5 sec 90-90 alternating heel tap x 5 each Figure-4 bridge x 10 each Sidelying hip abduction 2 x 15 each Modified front plank on knees 4 x 20 sec, 2 x 30 sec Cat cow 5 x 5 sec Bird dog 2 x 5 each Dead lift 30# from 4" box 2 x 10   [facility] Adult PT Treatment:                                                 DATE: 05/04/22 Therapeutic Exercise: Recumbent bike L3 x 3 min while taking subjective Bridge 2 x 10 with 5 sec 90-90 alternating heel tap 2 x 5 each Sidelying hip abduction 2 x 10 each Cat cow 5 x 5 sec Bird dog x 5 each Dead lift 30# from 4" box 2 x 10 Standing springboard bar pulldown with yellow 2 x 10  [facility] Adult PT Treatment:                                                DATE: 04/27/22 Therapeutic Exercise: Recumbent bike L3 x 3 min while taking subjective LTR 5 x 5 sec each Piriformis stretch 2 x 20 sec each Modified thomas stretch with passive knee flexion 2 x 30 sec each Supine passive hamstring stretch 2 x 30 sec each Cat cow 10 x 5 sec Child's pose stretch 2 x 20 sec Bridge 10 x 5 sec - focus on posterior pelvic tilt 90-90 table top hold 2 x 5 with 5 sec hold Clamshell with red x 15 each   PATIENT EDUCATION:  Education details: HEP Person educated: Patient Education method: Programmer, multimedia, Demonstration, Actor cues, Verbal cues Education comprehension: verbalized understanding, returned demonstration, verbal cues required, tactile cues required, and needs further education   HOME EXERCISE PROGRAM: Access Code: [email or web address]     ASSESSMENT: CLINICAL IMPRESSION: Patient tolerated therapy well with no adverse effects. Therapy continues to focus on progressing her mobility and strength for lumbopelvic control. She seems to be progressing well with her therapy exercises, tolerating increased difficulty and resistance. She did not report any increased pain with therapy, she does require consistent cueing for proper lumbar control to avoid excessive extension. Updated HEP to continue strength progression for home. Patient would benefit from continued skilled PT to progress her mobility and strength in order to reduce pain and maximize functional ability.      OBJECTIVE IMPAIRMENTS decreased activity tolerance, decreased ROM, decreased strength, increased muscle spasms,  impaired flexibility, postural dysfunction, and pain.    ACTIVITY LIMITATIONS carrying, lifting, bending, standing, dressing, hygiene/grooming, and locomotion level   PARTICIPATION LIMITATIONS: meal prep, cleaning, shopping, community activity, and occupation   PERSONAL FACTORS Fitness and  Time since onset of injury/illness/exacerbation are also affecting patient's functional outcome.      GOALS: Goals reviewed with patient? Yes   SHORT TERM GOALS: Target date: 05/19/2022   Patient will be I with initial HEP in order to progress with therapy. Baseline: HEP provided at eval Goal status: INITIAL   2.  Patient will demonstrate lumbar AROM grossly WFL in order to improve ability to bend forward for dressing and grooming tasks Baseline: patient exhibits limitations in lumbar AROM with increased pain Goal status: INITIAL   3.  Patient will report pain level </= 4/10 in order to improve tolerance for work related tasks and reduce functional limitations Baseline: 7/10 pain Goal status: INITIAL   LONG TERM GOALS: Target date: 06/16/2022   Patient will be I with final HEP to maintain progress from PT. Baseline: HEP provided at eval Goal status: INITIAL   2.  Patient will report Modified Oswestry </= 20% in order to indicate improved functional ability Baseline: 42% Goal status: INITIAL   3.  Patient will demonstrate improve core/hip strength >/= 4/5 MMT and knee strength = 5/5 MMT in order to improve her standing and walking tolerance so she can perform better at work Baseline: patient exhibits gross core/hip and knee strength deficits Goal status: INITIAL   4.  Patient will report pain level </= 2/10 with all activities in order to all return to prior level of activity and exercise without limitations Baseline: 7/10 pain at eval Goal status: INITIAL     PLAN: PT FREQUENCY: 1x/week   PT DURATION: 8 weeks   PLANNED INTERVENTIONS: Therapeutic exercises, Therapeutic activity,  Neuromuscular re-education, Balance training, Gait training, Patient/Family education, Self Care, Joint mobilization, Joint manipulation, Aquatic Therapy, Dry Needling, Electrical stimulation, Spinal manipulation, Spinal mobilization, Cryotherapy, Moist heat, Manual therapy, and Re-evaluation.   PLAN FOR NEXT SESSION: Patient tolerated therapy well with no adverse effects, manual/dry needling for lumbar region as indicated, lumbar mobility, progress core and hip strengthening, initiate lifting as able, postural control and strengthening    Rosana Hoes, PT, DPT, LAT, ATC 05/11/22  5:00 PM Phone: [phone] Fax: [phone]

## 2022-05-11 ENCOUNTER — Other Ambulatory Visit: Payer: Self-pay

## 2022-05-11 ENCOUNTER — Ambulatory Visit: Payer: Medicaid Other | Admitting: Physical Therapy

## 2022-05-11 ENCOUNTER — Encounter: Payer: Self-pay | Admitting: Physical Therapy

## 2022-05-11 DIAGNOSIS — M5459 Other low back pain: Secondary | ICD-10-CM | POA: Diagnosis not present

## 2022-05-11 DIAGNOSIS — M6281 Muscle weakness (generalized): Secondary | ICD-10-CM

## 2022-05-11 NOTE — Patient Instructions (Signed)
Access Code: [email or web address] URL: [email or web address] Date: 05/11/2022 Prepared by: Rosana Hoes  Exercises - Supine Lower Trunk Rotation  - 1 x daily - 10 reps - 5 seconds hold - Figure 4 Bridge  - 1 x daily - 2 sets - 10 reps - Sidelying Hip Abduction  - 1 x daily - 2 sets - 15 reps - Plank on Knees  - 1 x daily - 5 reps - 20-30 seconds hold - Cat Cow  - 1 x daily - 10 reps - 5 seconds hold - Bird Dog  - 1 x daily - 2 sets - 10 reps

## 2022-05-14 ENCOUNTER — Ambulatory Visit: Payer: Medicaid Other

## 2022-05-14 ENCOUNTER — Other Ambulatory Visit: Payer: Self-pay | Admitting: Nurse Practitioner

## 2022-05-14 ENCOUNTER — Ambulatory Visit (INDEPENDENT_AMBULATORY_CARE_PROVIDER_SITE_OTHER): Payer: Medicaid Other | Admitting: Clinical

## 2022-05-14 DIAGNOSIS — F419 Anxiety disorder, unspecified: Secondary | ICD-10-CM | POA: Diagnosis not present

## 2022-05-14 DIAGNOSIS — F32A Depression, unspecified: Secondary | ICD-10-CM

## 2022-05-14 DIAGNOSIS — A64 Unspecified sexually transmitted disease: Secondary | ICD-10-CM

## 2022-05-14 NOTE — Progress Notes (Unsigned)
Orders Placed This Encounter  Procedures   RPR+HIV+GC+CT Panel
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
## 2022-05-17 LAB — RPR+HIV+GC+CT PANEL
Chlamydia trachomatis, NAA: NEGATIVE
HIV Screen 4th Generation wRfx: NONREACTIVE
Neisseria Gonorrhoeae by PCR: NEGATIVE
RPR Ser Ql: NONREACTIVE

## 2022-05-18 NOTE — Therapy (Unsigned)
OUTPATIENT PHYSICAL THERAPY TREATMENT NOTE   Patient Name: Deanna Myers MRN: [id number] DOB:11-15-1990, 31 y.o., female Today's Date: 05/19/2022  PCP: Fenton Foy, NP   REFERRING PROVIDER: Aundra Dubin, PA-C   END OF SESSION:   PT End of Session - 05/19/22 1629     Visit Number 5    Number of Visits 8    Date for PT Re-Evaluation 06/16/22    Authorization Type [employer]    Authorization Time Period 04/26/2022 - 06/25/2022    Authorization - Visit Number 4    Authorization - Number of Visits 10    PT Start Time 1622    PT Stop Time 1700    PT Time Calculation (min) 38 min    Activity Tolerance Patient tolerated treatment well    Behavior During Therapy WFL for tasks assessed/performed                Past Medical History:  Diagnosis Date   ADHD (attention deficit hyperactivity disorder)    since childhood   Asthma    inhaler last used prior to pregnancy   BV (bacterial vaginosis)    Chlamydia    GERD (gastroesophageal reflux disease)    Gonorrhea    Hyperemesis    G3   Past Surgical History:  Procedure Laterality Date   CESAREAN SECTION N/A 12/21/2015   Procedure: CESAREAN SECTION;  Surgeon: Paula Compton, MD;  Location: [facility];  Service: Obstetrics;  Laterality: N/A;   LAPAROSCOPIC APPENDECTOMY N/A 12/21/2021   Procedure: APPENDECTOMY LAPAROSCOPIC;  Surgeon: Dwan Bolt, MD;  Location: [place];  Service: General;  Laterality: N/A;   Patient Active Problem List   Diagnosis Date Noted   Healthcare maintenance 04/01/2022   Active labor at term 12/21/2015   UTI (urinary tract infection) 06/13/2015   Pyelonephritis affecting pregnancy in first trimester 32/20/2542   Folliculitis 70/62/3762    REFERRING DIAG: Bilateral leg paresthesia, Chronic bilateral low back pain with bilateral sciatica  THERAPY DIAG:  Other low back pain  Muscle weakness (generalized)  Rationale for Evaluation and Treatment Rehabilitation  PERTINENT HISTORY:  None  PRECAUTIONS: None   SUBJECTIVE: Patient reports everything is going good.   PAIN:  Are you having pain? Yes:  NPRS scale: 4/10 Pain location: Low back Pain description: Constant, pulling, stabbing Aggravating factors: Bending forward, moving around too much Relieving factors: Hot baths with Epson salt   OBJECTIVE: (objective measures completed at initial evaluation unless otherwise dated) PATIENT SURVEYS:  Modified Oswestry 42% disability    MUSCLE LENGTH: Hamstring flexibility deficit   POSTURE:  Rounded shoulder, increased lumbar lordosis, knee valgus   PALPATION: Tender to palpation bilateral lumbar paraspinals   LUMBAR ROM:    Active  A/PROM  eval AROM 05/04/2022 AROM 05/19/2022  Flexion 75% WFL WFL  Extension 50% WFL WFL  Right lateral flexion WFL  WFL  Left lateral flexion WFL  WFL  Right rotation 50%  WFL  Left rotation 50%  WFL   LOWER EXTREMITY MMT:     MMT Right eval Left eval  Hip flexion 4 4  Hip extension 3 3  Hip abduction 4- 4-  Knee flexion 4 4  Knee extension 4 4  Ankle dorsiflexion 5 5    FUNCTIONAL TESTS:  Patient unable to hold front plank 05/11/2022: patient able to hold modified front plank on knees for 20 seconds with proper form  GAIT: Assistive device utilized: None Level of assistance: Complete Independence Comments:  grossly [facility]  TODAY'S TREATMENT  [facility] Adult PT Treatment:                                                DATE: 05/19/22 Therapeutic Exercise: Elliptical L1 R1 x 5 min while taking subjective Figure-4 bridge 2 x 10 each 90-90 alternating leg extension 3 x 6 LTR with feet elevated 2 x 5 90-90 alternating heel tap x 5 each Modified side plank with hip abduction 2 x 8 each Modified front plank on knees 3 x 30 sec Dead lift 45# KB 2 x 10 Pallof press with red power band 2 x 10 each   [facility] Adult PT Treatment:                                                DATE: 05/11/22 Therapeutic Exercise: Recumbent  bike L3 x 3 min while taking subjective LTR 5 x 5 sec Bridge x 10 with 5 sec 90-90 alternating heel tap x 5 each Figure-4 bridge x 10 each Sidelying hip abduction 2 x 15 each Modified front plank on knees 4 x 20 sec, 2 x 30 sec Cat cow 5 x 5 sec Bird dog 2 x 5 each Dead lift 30# from 4" box 2 x 10  [facility] Adult PT Treatment:                                                DATE: 05/04/22 Therapeutic Exercise: Recumbent bike L3 x 3 min while taking subjective Bridge 2 x 10 with 5 sec 90-90 alternating heel tap 2 x 5 each Sidelying hip abduction 2 x 10 each Cat cow 5 x 5 sec Bird dog x 5 each Dead lift 30# from 4" box 2 x 10 Standing springboard bar pulldown with yellow 2 x 10   PATIENT EDUCATION:  Education details: HEP Person educated: Patient Education method: Consulting civil engineer, Demonstration, Corporate treasurer cues, Verbal cues Education comprehension: verbalized understanding, returned demonstration, verbal cues required, tactile cues required, and needs further education   HOME EXERCISE PROGRAM: Access Code: [email or web address]     ASSESSMENT: CLINICAL IMPRESSION: Patient tolerated therapy well with no adverse effects. Therapy focused on continued progression of core stabilization and strengthening to improve lumbopelvic control. She is tolerating progression in difficulty and resistance for all exercises and did report any increase in pain this visit. She does require cueing for proper control with exercises but has been improving. No changes to HEP this visit. Patient would benefit from continued skilled PT to progress her mobility and strength in order to reduce pain and maximize functional ability.      OBJECTIVE IMPAIRMENTS decreased activity tolerance, decreased ROM, decreased strength, increased muscle spasms, impaired flexibility, postural dysfunction, and pain.    ACTIVITY LIMITATIONS carrying, lifting, bending, standing, dressing, hygiene/grooming, and locomotion level   PARTICIPATION  LIMITATIONS: meal prep, cleaning, shopping, community activity, and occupation   [place] and Time since onset of injury/illness/exacerbation are also affecting patient's functional outcome.      GOALS: Goals reviewed with patient? Yes   SHORT TERM GOALS: Target date: 05/19/2022   Patient will  be I with initial HEP in order to progress with therapy. Baseline: HEP provided at eval 05/19/2022: independent Goal status: MET   2.  Patient will demonstrate lumbar AROM grossly WFL in order to improve ability to bend forward for dressing and grooming tasks Baseline: patient exhibits limitations in lumbar AROM with increased pain 05/19/2022: lumbar AROM grossly WFL Goal status: MET   3.  Patient will report pain level </= 4/10 in order to improve tolerance for work related tasks and reduce functional limitations Baseline: 7/10 pain 05/19/2022: 4/10 Goal status: MET   LONG TERM GOALS: Target date: 06/16/2022   Patient will be I with final HEP to maintain progress from PT. Baseline: HEP provided at eval Goal status: INITIAL   2.  Patient will report Modified Oswestry </= 20% in order to indicate improved functional ability Baseline: 42% Goal status: INITIAL   3.  Patient will demonstrate improve core/hip strength >/= 4/5 MMT and knee strength = 5/5 MMT in order to improve her standing and walking tolerance so she can perform better at work Baseline: patient exhibits gross core/hip and knee strength deficits Goal status: INITIAL   4.  Patient will report pain level </= 2/10 with all activities in order to all return to prior level of activity and exercise without limitations Baseline: 7/10 pain at eval Goal status: INITIAL     PLAN: PT FREQUENCY: 1x/week   PT DURATION: 8 weeks   PLANNED INTERVENTIONS: Therapeutic exercises, Therapeutic activity, Neuromuscular re-education, Balance training, Gait training, Patient/Family education, Self Care, Joint mobilization, Joint  manipulation, Aquatic Therapy, Dry Needling, Electrical stimulation, Spinal manipulation, Spinal mobilization, Cryotherapy, Moist heat, Manual therapy, and Re-evaluation.   PLAN FOR NEXT SESSION: Patient tolerated therapy well with no adverse effects, manual/dry needling for lumbar region as indicated, lumbar mobility, progress core and hip strengthening, initiate lifting as able, postural control and strengthening    Hilda Blades, PT, DPT, LAT, ATC 05/19/22  5:00 PM Phone: [phone] Fax: [phone]

## 2022-05-19 ENCOUNTER — Encounter: Payer: Self-pay | Admitting: Physical Therapy

## 2022-05-19 ENCOUNTER — Ambulatory Visit: Payer: Medicaid Other | Admitting: Physical Therapy

## 2022-05-19 ENCOUNTER — Other Ambulatory Visit: Payer: Self-pay

## 2022-05-19 ENCOUNTER — Other Ambulatory Visit: Payer: Self-pay | Admitting: Nurse Practitioner

## 2022-05-19 DIAGNOSIS — M6281 Muscle weakness (generalized): Secondary | ICD-10-CM

## 2022-05-19 DIAGNOSIS — M5459 Other low back pain: Secondary | ICD-10-CM | POA: Diagnosis not present

## 2022-05-19 LAB — NUSWAB VAGINITIS PLUS (VG+)
Atopobium vaginae: HIGH Score — AB
BVAB 2: HIGH Score — AB
Candida albicans, NAA: NEGATIVE
Candida glabrata, NAA: NEGATIVE
Chlamydia trachomatis, NAA: UNDETERMINED — AB
Megasphaera 1: HIGH Score — AB
Neisseria gonorrhoeae, NAA: NEGATIVE
Trich vag by NAA: NEGATIVE

## 2022-05-19 MED ORDER — METRONIDAZOLE 500 MG PO TABS: 500.0000 mg | ORAL_TABLET | Freq: Two times a day (BID) | ORAL | 0 refills | Status: DC

## 2022-05-20 ENCOUNTER — Other Ambulatory Visit: Payer: Self-pay | Admitting: Nurse Practitioner

## 2022-05-20 MED ORDER — CLINDAMYCIN HCL 300 MG PO CAPS: 300.0000 mg | ORAL_CAPSULE | Freq: Three times a day (TID) | ORAL | 0 refills | Status: AC

## 2022-05-20 NOTE — Progress Notes (Signed)
Patient states that flagyl is making her sick. Will switch to clindamycin. Patient will need to return for repeat swab - chlamydia was equivocal on last check.

## 2022-05-21 ENCOUNTER — Ambulatory Visit (INDEPENDENT_AMBULATORY_CARE_PROVIDER_SITE_OTHER): Payer: Medicaid Other | Admitting: Clinical

## 2022-05-21 DIAGNOSIS — F419 Anxiety disorder, unspecified: Secondary | ICD-10-CM | POA: Diagnosis not present

## 2022-05-21 DIAGNOSIS — F32A Depression, unspecified: Secondary | ICD-10-CM
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
## 2022-05-26 ENCOUNTER — Encounter: Payer: Self-pay | Admitting: Physical Therapy

## 2022-05-26 ENCOUNTER — Ambulatory Visit: Payer: Medicaid Other | Admitting: Physical Therapy

## 2022-05-26 DIAGNOSIS — M6281 Muscle weakness (generalized): Secondary | ICD-10-CM

## 2022-05-26 DIAGNOSIS — M5459 Other low back pain: Secondary | ICD-10-CM | POA: Diagnosis not present

## 2022-05-26 NOTE — Therapy (Addendum)
OUTPATIENT PHYSICAL THERAPY TREATMENT NOTE   Patient Name: Deanna Myers MRN: [id number] DOB:1991/05/02, 31 y.o., female Today's Date: 05/26/2022  PCP: Fenton Foy, NP   REFERRING PROVIDER: Aundra Dubin, PA-C   END OF SESSION:   PT End of Session - 05/26/22 1700     Visit Number 6    Number of Visits 8    Date for PT Re-Evaluation 06/16/22    Authorization Type [employer]    Authorization Time Period 04/26/2022 - 06/25/2022    Authorization - Visit Number 6    Authorization - Number of Visits 10    PT Start Time 1700    PT Stop Time 1740    PT Time Calculation (min) 40 min    Activity Tolerance Patient tolerated treatment well    Behavior During Therapy WFL for tasks assessed/performed                Past Medical History:  Diagnosis Date   ADHD (attention deficit hyperactivity disorder)    since childhood   Asthma    inhaler last used prior to pregnancy   BV (bacterial vaginosis)    Chlamydia    GERD (gastroesophageal reflux disease)    Gonorrhea    Hyperemesis    G3   Past Surgical History:  Procedure Laterality Date   CESAREAN SECTION N/A 12/21/2015   Procedure: CESAREAN SECTION;  Surgeon: Paula Compton, MD;  Location: [facility];  Service: Obstetrics;  Laterality: N/A;   LAPAROSCOPIC APPENDECTOMY N/A 12/21/2021   Procedure: APPENDECTOMY LAPAROSCOPIC;  Surgeon: Dwan Bolt, MD;  Location: [facility];  Service: General;  Laterality: N/A;   Patient Active Problem List   Diagnosis Date Noted   Healthcare maintenance 04/01/2022   Active labor at term 12/21/2015   UTI (urinary tract infection) 06/13/2015   Pyelonephritis affecting pregnancy in first trimester 81/10/7508   Folliculitis 25/85/2778    REFERRING DIAG: Bilateral leg paresthesia, Chronic bilateral low back pain with bilateral sciatica  THERAPY DIAG:  Other low back pain  Muscle weakness (generalized)  Rationale for Evaluation and Treatment Rehabilitation  PERTINENT HISTORY:  None  PRECAUTIONS: None   SUBJECTIVE: Pt reports that she is seeing good progress.  PAIN:  Are you having pain? Yes:  NPRS scale: 4/10 Pain location: Low back Pain description: Constant, pulling, stabbing Aggravating factors: Bending forward, moving around too much Relieving factors: Hot baths with Epson salt   OBJECTIVE: (objective measures completed at initial evaluation unless otherwise dated) PATIENT SURVEYS:  Modified Oswestry 42% disability    MUSCLE LENGTH: Hamstring flexibility deficit   POSTURE:  Rounded shoulder, increased lumbar lordosis, knee valgus   PALPATION: Tender to palpation bilateral lumbar paraspinals   LUMBAR ROM:    Active  A/PROM  eval AROM 05/04/2022 AROM 05/19/2022  Flexion 75% WFL WFL  Extension 50% WFL WFL  Right lateral flexion WFL  WFL  Left lateral flexion WFL  WFL  Right rotation 50%  WFL  Left rotation 50%  WFL   LOWER EXTREMITY MMT:     MMT Right eval Left eval  Hip flexion 4 4  Hip extension 3 3  Hip abduction 4- 4-  Knee flexion 4 4  Knee extension 4 4  Ankle dorsiflexion 5 5    FUNCTIONAL TESTS:  Patient unable to hold front plank 05/11/2022: patient able to hold modified front plank on knees for 20 seconds with proper form  GAIT: Assistive device utilized: None Level of assistance: Complete [place] Comments:  grossly [facility]  TODAY'S TREATMENT   [facility] Adult PT Treatment:                                                DATE: 05/26/22 Therapeutic Exercise: Elliptical L1 R1 x 5 min while taking subjective Figure-4 bridge 2 x 10 each - 3'' hold Dead bug with ball - 4x10 (5 on ea side) LE X over - 10x ea Modified side plank with hip abduction 2 x10 each Plank from toes - 5x 5'' Dead lift 45# KB 3 x 10 Pallof press with black power band 2 x 10 each  [facility] Adult PT Treatment:                                                DATE: 05/19/22 Therapeutic Exercise: Elliptical L1 R1 x 5 min while taking  subjective Figure-4 bridge 2 x 10 each 90-90 alternating leg extension 3 x 6 LTR with feet elevated 2 x 5 90-90 alternating heel tap x 5 each Modified side plank with hip abduction 2 x 8 each Modified front plank on knees 3 x 30 sec Dead lift 45# KB 2 x 10 Pallof press with red power band 2 x 10 each   [facility] Adult PT Treatment:                                                DATE: 05/11/22 Therapeutic Exercise: Recumbent bike L3 x 3 min while taking subjective LTR 5 x 5 sec Bridge x 10 with 5 sec 90-90 alternating heel tap x 5 each Figure-4 bridge x 10 each Sidelying hip abduction 2 x 15 each Modified front plank on knees 4 x 20 sec, 2 x 30 sec Cat cow 5 x 5 sec Bird dog 2 x 5 each Dead lift 30# from 4" box 2 x 10  [facility] Adult PT Treatment:                                                DATE: 05/04/22 Therapeutic Exercise: Recumbent bike L3 x 3 min while taking subjective Bridge 2 x 10 with 5 sec 90-90 alternating heel tap 2 x 5 each Sidelying hip abduction 2 x 10 each Cat cow 5 x 5 sec Bird dog x 5 each Dead lift 30# from 4" box 2 x 10 Standing springboard bar pulldown with yellow 2 x 10   PATIENT EDUCATION:  Education details: HEP Person educated: Patient Education method: Consulting civil engineer, Demonstration, Corporate treasurer cues, Verbal cues Education comprehension: verbalized understanding, returned demonstration, verbal cues required, tactile cues required, and needs further education   HOME EXERCISE PROGRAM: Access Code: [email or web address]     ASSESSMENT: CLINICAL IMPRESSION: Patient tolerated therapy well with no adverse reaction.  We continued with core strength/endurance progression to good effect.  We were able to increase intensity and/or volume of several exercises.  She had no increase in pain but high levels of fatigue.  Cued for pacing throughout.  Will continue  to progress as tolerated.     OBJECTIVE IMPAIRMENTS decreased activity tolerance, decreased ROM, decreased strength,  increased muscle spasms, impaired flexibility, postural dysfunction, and pain.    ACTIVITY LIMITATIONS carrying, lifting, bending, standing, dressing, hygiene/grooming, and locomotion level   PARTICIPATION LIMITATIONS: meal prep, cleaning, shopping, community activity, and occupation   [place] and Time since onset of injury/illness/exacerbation are also affecting patient's functional outcome.      GOALS: Goals reviewed with patient? Yes   SHORT TERM GOALS: Target date: 05/19/2022   Patient will be I with initial HEP in order to progress with therapy. Baseline: HEP provided at eval 05/19/2022: independent Goal status: MET   2.  Patient will demonstrate lumbar AROM grossly WFL in order to improve ability to bend forward for dressing and grooming tasks Baseline: patient exhibits limitations in lumbar AROM with increased pain 05/19/2022: lumbar AROM grossly WFL Goal status: MET   3.  Patient will report pain level </= 4/10 in order to improve tolerance for work related tasks and reduce functional limitations Baseline: 7/10 pain 05/19/2022: 4/10 Goal status: MET   LONG TERM GOALS: Target date: 06/16/2022   Patient will be I with final HEP to maintain progress from PT. Baseline: HEP provided at eval Goal status: INITIAL   2.  Patient will report Modified Oswestry </= 20% in order to indicate improved functional ability Baseline: 42% Goal status: INITIAL   3.  Patient will demonstrate improve core/hip strength >/= 4/5 MMT and knee strength = 5/5 MMT in order to improve her standing and walking tolerance so she can perform better at work Baseline: patient exhibits gross core/hip and knee strength deficits Goal status: INITIAL   4.  Patient will report pain level </= 2/10 with all activities in order to all return to prior level of activity and exercise without limitations Baseline: 7/10 pain at eval Goal status: INITIAL     PLAN: PT FREQUENCY: 1x/week   PT  DURATION: 8 weeks   PLANNED INTERVENTIONS: Therapeutic exercises, Therapeutic activity, Neuromuscular re-education, Balance training, Gait training, Patient/Family education, Self Care, Joint mobilization, Joint manipulation, Aquatic Therapy, Dry Needling, Electrical stimulation, Spinal manipulation, Spinal mobilization, Cryotherapy, Moist heat, Manual therapy, and Re-evaluation.   PLAN FOR NEXT SESSION: Patient tolerated therapy well with no adverse effects, manual/dry needling for lumbar region as indicated, lumbar mobility, progress core and hip strengthening, initiate lifting as able, postural control and strengthening   Mathis Dad PT 05/26/22  5:41 PM Phone: [phone] Fax: [phone]

## 2022-05-28 ENCOUNTER — Ambulatory Visit (INDEPENDENT_AMBULATORY_CARE_PROVIDER_SITE_OTHER): Payer: Medicaid Other | Admitting: Clinical

## 2022-05-28 ENCOUNTER — Encounter: Payer: Self-pay | Admitting: Nurse Practitioner

## 2022-05-28 ENCOUNTER — Ambulatory Visit (INDEPENDENT_AMBULATORY_CARE_PROVIDER_SITE_OTHER): Payer: Medicaid Other | Admitting: Nurse Practitioner

## 2022-05-28 ENCOUNTER — Other Ambulatory Visit: Payer: Self-pay

## 2022-05-28 VITALS — BP 123/73 | HR 83 | Temp 97.7°F | Ht 66.0 in | Wt 242.0 lb

## 2022-05-28 DIAGNOSIS — F419 Anxiety disorder, unspecified: Secondary | ICD-10-CM | POA: Diagnosis not present

## 2022-05-28 DIAGNOSIS — R600 Localized edema: Secondary | ICD-10-CM | POA: Diagnosis not present

## 2022-05-28 DIAGNOSIS — M79605 Pain in left leg: Secondary | ICD-10-CM | POA: Diagnosis not present

## 2022-05-28 DIAGNOSIS — F32A Depression, unspecified: Secondary | ICD-10-CM | POA: Diagnosis not present

## 2022-05-28 DIAGNOSIS — A64 Unspecified sexually transmitted disease: Secondary | ICD-10-CM

## 2022-05-28 MED ORDER — GABAPENTIN 100 MG PO CAPS: 100.0000 mg | ORAL_CAPSULE | Freq: Three times a day (TID) | ORAL | 3 refills | Status: DC

## 2022-05-28 NOTE — Patient Instructions (Addendum)
1. Left leg pain  - Ambulatory referral to Vascular Surgery - gabapentin (NEURONTIN) 100 MG capsule; Take 1 capsule (100 mg total) by mouth 3 (three) times daily.  Dispense: 90 capsule; Refill: 3  2. Leg edema, left  - Ambulatory referral to Vascular Surgery - gabapentin (NEURONTIN) 100 MG capsule; Take 1 capsule (100 mg total) by mouth 3 (three) times daily.  Dispense: 90 capsule; Refill: 3  STD testing:  Will recheck swab today  Follow up:  Follow up in 3 months or sooner
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
## 2022-05-31 LAB — NUSWAB VAGINITIS PLUS (VG+)
Candida albicans, NAA: NEGATIVE
Candida glabrata, NAA: NEGATIVE
Chlamydia trachomatis, NAA: NEGATIVE
Neisseria gonorrhoeae, NAA: NEGATIVE
Trich vag by NAA: NEGATIVE

## 2022-06-02 ENCOUNTER — Ambulatory Visit: Payer: Medicaid Other

## 2022-06-02 DIAGNOSIS — M6281 Muscle weakness (generalized): Secondary | ICD-10-CM

## 2022-06-02 DIAGNOSIS — M5459 Other low back pain: Secondary | ICD-10-CM
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
## 2022-06-04 ENCOUNTER — Ambulatory Visit: Payer: Medicaid Other | Admitting: Clinical

## 2022-06-09 ENCOUNTER — Ambulatory Visit: Payer: Medicaid Other

## 2022-06-11 ENCOUNTER — Ambulatory Visit (INDEPENDENT_AMBULATORY_CARE_PROVIDER_SITE_OTHER): Payer: Medicaid Other | Admitting: Clinical

## 2022-06-11 DIAGNOSIS — F32A Depression, unspecified: Secondary | ICD-10-CM | POA: Diagnosis not present

## 2022-06-11 DIAGNOSIS — F419 Anxiety disorder, unspecified: Secondary | ICD-10-CM
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
## 2022-06-16 ENCOUNTER — Ambulatory Visit: Payer: Medicaid Other | Attending: Physician Assistant | Admitting: Physical Therapy

## 2022-06-18 ENCOUNTER — Ambulatory Visit (INDEPENDENT_AMBULATORY_CARE_PROVIDER_SITE_OTHER): Payer: Medicaid Other | Admitting: Clinical

## 2022-06-18 DIAGNOSIS — F32A Depression, unspecified: Secondary | ICD-10-CM | POA: Diagnosis not present

## 2022-06-18 DIAGNOSIS — F419 Anxiety disorder, unspecified: Secondary | ICD-10-CM
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
## 2022-06-25 ENCOUNTER — Ambulatory Visit: Payer: Self-pay | Admitting: Clinical

## 2022-06-29 ENCOUNTER — Encounter: Payer: Self-pay | Admitting: Internal Medicine

## 2022-06-30 ENCOUNTER — Encounter: Payer: Self-pay | Admitting: Nurse Practitioner

## 2022-06-30 DIAGNOSIS — M79605 Pain in left leg: Secondary | ICD-10-CM | POA: Insufficient documentation

## 2022-06-30 NOTE — Assessment & Plan Note (Signed)
-   Ambulatory referral to Vascular Surgery - gabapentin (NEURONTIN) 100 MG capsule; Take 1 capsule (100 mg total) by mouth 3 (three) times daily.  Dispense: 90 capsule; Refill: 3  2. Leg edema, left  - Ambulatory referral to Vascular Surgery - gabapentin (NEURONTIN) 100 MG capsule; Take 1 capsule (100 mg total) by mouth 3 (three) times daily.  Dispense: 90 capsule; Refill: 3  STD testing:  Will recheck swab today  Follow up:  Follow up in 3 months or sooner

## 2022-07-02 ENCOUNTER — Ambulatory Visit (INDEPENDENT_AMBULATORY_CARE_PROVIDER_SITE_OTHER): Payer: Medicaid Other | Admitting: Clinical

## 2022-07-02 DIAGNOSIS — F32A Depression, unspecified: Secondary | ICD-10-CM | POA: Diagnosis not present

## 2022-07-02 DIAGNOSIS — F419 Anxiety disorder, unspecified: Secondary | ICD-10-CM | POA: Diagnosis not present
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
## 2022-07-09 ENCOUNTER — Ambulatory Visit (INDEPENDENT_AMBULATORY_CARE_PROVIDER_SITE_OTHER): Payer: Medicaid Other | Admitting: Clinical

## 2022-07-09 DIAGNOSIS — F32A Depression, unspecified: Secondary | ICD-10-CM

## 2022-07-09 DIAGNOSIS — F419 Anxiety disorder, unspecified: Secondary | ICD-10-CM
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
## 2022-07-14 ENCOUNTER — Ambulatory Visit (HOSPITAL_COMMUNITY): Payer: Medicaid Other | Admitting: Student

## 2022-07-14 ENCOUNTER — Encounter: Payer: Medicaid Other | Admitting: Clinical

## 2022-07-14 ENCOUNTER — Encounter (HOSPITAL_COMMUNITY): Payer: Self-pay

## 2022-07-16 ENCOUNTER — Ambulatory Visit (INDEPENDENT_AMBULATORY_CARE_PROVIDER_SITE_OTHER): Payer: Medicaid Other | Admitting: Clinical

## 2022-07-16 DIAGNOSIS — F32A Depression, unspecified: Secondary | ICD-10-CM

## 2022-07-16 DIAGNOSIS — F419 Anxiety disorder, unspecified: Secondary | ICD-10-CM | POA: Diagnosis not present
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
## 2022-07-21 ENCOUNTER — Ambulatory Visit (INDEPENDENT_AMBULATORY_CARE_PROVIDER_SITE_OTHER): Payer: Medicaid Other | Admitting: Clinical

## 2022-07-21 DIAGNOSIS — F418 Other specified anxiety disorders: Secondary | ICD-10-CM

## 2022-07-21 DIAGNOSIS — F419 Anxiety disorder, unspecified: Secondary | ICD-10-CM
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
## 2022-07-30 ENCOUNTER — Ambulatory Visit: Payer: Medicaid Other | Admitting: Clinical

## 2022-07-30 ENCOUNTER — Ambulatory Visit: Payer: Self-pay | Admitting: Nurse Practitioner

## 2022-08-03 ENCOUNTER — Ambulatory Visit: Payer: Medicaid Other | Admitting: Clinical

## 2022-08-05 ENCOUNTER — Ambulatory Visit (INDEPENDENT_AMBULATORY_CARE_PROVIDER_SITE_OTHER): Payer: Medicaid Other | Admitting: Clinical

## 2022-08-05 DIAGNOSIS — F32A Depression, unspecified: Secondary | ICD-10-CM

## 2022-08-05 DIAGNOSIS — F419 Anxiety disorder, unspecified: Secondary | ICD-10-CM | POA: Diagnosis not present
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
## 2022-08-06 ENCOUNTER — Other Ambulatory Visit (INDEPENDENT_AMBULATORY_CARE_PROVIDER_SITE_OTHER): Payer: Medicaid Other

## 2022-08-06 ENCOUNTER — Encounter: Payer: Self-pay | Admitting: Internal Medicine

## 2022-08-06 ENCOUNTER — Telehealth: Payer: Self-pay | Admitting: Clinical

## 2022-08-06 ENCOUNTER — Ambulatory Visit: Payer: Medicaid Other | Admitting: Internal Medicine

## 2022-08-06 VITALS — BP 112/62 | HR 112 | Ht 66.0 in | Wt 244.5 lb

## 2022-08-06 DIAGNOSIS — R110 Nausea: Secondary | ICD-10-CM

## 2022-08-06 DIAGNOSIS — R1904 Left lower quadrant abdominal swelling, mass and lump: Secondary | ICD-10-CM

## 2022-08-06 LAB — SEDIMENTATION RATE: Sed Rate: 48 mm/hr — ABNORMAL HIGH (ref 0–20)

## 2022-08-06 LAB — C-REACTIVE PROTEIN: CRP: <1 mg/dL (ref 0.5–20.0)

## 2022-08-06 MED ORDER — DICYCLOMINE HCL 10 MG PO CAPS: 10.0000 mg | ORAL_CAPSULE | Freq: Four times a day (QID) | ORAL | 2 refills | Status: DC | PRN

## 2022-08-06 NOTE — Telephone Encounter (Signed)
Integrated Behavioral Health General Follow Up Note  08/06/2022 Name: GRACY EHLY MRN: [id number] DOB: [birth year] [name] is a 31 y.o. year old female who sees Fenton Foy, NP for primary care. LCSW was initially consulted to assist the patient with mental health counseling and resources.  Interpreter: No.   Interpreter Name & Language: none  Assessment: Patient experiencing mental health concerns.  Ongoing Intervention: Today CSW called patient to check in. Spent majority of visit yesterday completing intake paperwork for [place] for assessment and medication management. Some brief supportive counseling provided today.  Review of patient status, including review of consultants reports, relevant laboratory and other test results, and collaboration with appropriate care team members and the patient's provider was performed as part of comprehensive patient evaluation and provision of services.    Estanislado Emms, [facility] [phone]

## 2022-08-06 NOTE — Progress Notes (Signed)
Chief Complaint: Ab pain  HPI : 31 year old female with history of asthma, GERD, ADHD presents with ab pain  She has been suffering from LLQ ab pain over 1 year. The pain occurs every day and some days are worse than others. Not sure what has causing the pain to get worse or better. Eating doesn't affect the pain. Having a BM does not affect the pain either. She was checked by her OB/GYN who did not think that there was a gynecologic explanation for the pain. She will have gas and reflux issues as well. She now has 2-3 BMs per day. She has been trying to consume more fruits and vegetables. She has been drinking herbal teas to try to keep things moving. Denies blood in the stools. Endorses some nausea. Denies vomiting. She is not pregnant. Denies alcohol use. Denies family history of GI issues. Denies prior EGD or colonoscopy. She has been tried on Tylenol and ibuprofen for the pain. Denies dysuria.  Past Medical History:  Diagnosis Date   ADHD (attention deficit hyperactivity disorder)    since childhood   Asthma    inhaler last used prior to pregnancy   BV (bacterial vaginosis)    Chlamydia    GERD (gastroesophageal reflux disease)    Gonorrhea    Hyperemesis    G3   Past Surgical History:  Procedure Laterality Date   CESAREAN SECTION N/A 12/21/2015   Procedure: CESAREAN SECTION;  Surgeon: Paula Compton, MD;  Location: [facility];  Service: Obstetrics;  Laterality: N/A;   LAPAROSCOPIC APPENDECTOMY N/A 12/21/2021   Procedure: APPENDECTOMY LAPAROSCOPIC;  Surgeon: Dwan Bolt, MD;  Location: [facility];  Service: General;  Laterality: N/A;   Family History  Problem Relation Age of Onset   Hypertension Father    Hypertension Mother    Kidney disease Mother    Cervical cancer Paternal Aunt    Congestive Heart Failure Maternal Grandmother    Breast cancer Paternal Grandfather    Social History   Tobacco Use   Smoking status: Never   Smokeless tobacco: Never  Vaping Use   Vaping  Use: Never used  Substance Use Topics   Alcohol use: Yes    Comment: social   Drug use: No   Current Outpatient Medications  Medication Sig Dispense Refill   ondansetron (ZOFRAN) 4 MG tablet Take 1 tablet (4 mg total) by mouth every 8 (eight) hours as needed for nausea or vomiting. 20 tablet 5   acetaminophen (TYLENOL) 500 MG tablet Take 1,000 mg by mouth every 6 (six) hours as needed for moderate pain or headache. (Patient not taking: Reported on 08/06/2022)     Cholecalciferol (VITAMIN D-3 PO) Take 1 tablet by mouth daily. Unsure of how many units (Patient not taking: Reported on 08/06/2022)     furosemide (LASIX) 40 MG tablet Take 1 tablet (40 mg total) by mouth daily. (Patient not taking: Reported on 05/28/2022) 5 tablet 0   gabapentin (NEURONTIN) 100 MG capsule Take 1 capsule (100 mg total) by mouth 3 (three) times daily. (Patient not taking: Reported on 08/06/2022) 90 capsule 3   ibuprofen (ADVIL) 200 MG tablet Take 400 mg by mouth every 6 (six) hours as needed for headache or moderate pain. (Patient not taking: Reported on 08/06/2022)     meloxicam (MOBIC) 15 MG tablet Take 1 tablet (15 mg total) by mouth daily. (Patient not taking: Reported on 05/28/2022) 30 tablet 1   methocarbamol (ROBAXIN) 500 MG tablet Take 1 tablet (500  mg total) by mouth 2 (two) times daily as needed for muscle spasms. (Patient not taking: Reported on 05/28/2022) 20 tablet 2   naproxen (NAPROSYN) 500 MG tablet Take 1 tablet (500 mg total) by mouth 2 (two) times daily. (Patient not taking: Reported on 05/28/2022) 30 tablet 0   predniSONE (STERAPRED UNI-PAK 21 TAB) 10 MG (21) TBPK tablet Take as directed (Patient not taking: Reported on 08/06/2022) 21 tablet 0   rizatriptan (MAXALT) 10 MG tablet Take 1 tablet earliest onset of headache.  May repeat in 2 hours if needed.  Maximum 2 tablets in 24 hours. (Patient not taking: Reported on 08/06/2022) 10 tablet 5   No current facility-administered medications for this visit.    Allergies  Allergen Reactions   Shellfish Allergy Anaphylaxis   Adhesive [Tape] Swelling and Other (See Comments)    Reaction:  Bruising    Latex Hives   Betadine [Povidone Iodine] Rash   Review of Systems: All systems reviewed and negative except where noted in HPI.   Physical Exam: BP 112/62   Pulse (!) 112   Ht _0  (1.676 m)   Wt 244 lb 8 oz (110.9 kg)   BMI 39.46 kg/m  Constitutional: Pleasant,well-developed, female in no acute distress. HEENT: Normocephalic and atraumatic. Conjunctivae are normal. No scleral icterus. Cardiovascular: Normal rate, regular rhythm.  Pulmonary/chest: Effort normal and breath sounds normal. No wheezing, rales or rhonchi. Abdominal: Soft, nondistended, tender in the LLQ. Bowel sounds active throughout. There are no masses palpable. No hepatomegaly. Extremities: No edema Neurological: Alert and oriented to person place and time. Skin: Skin is warm and dry. No rashes noted. Psychiatric: Normal mood and affect. Behavior is normal.  Labs 03/2022: CBC nml. CMP unremarkable  CT A/P w/contrast 12/21/21: IMPRESSION: Acute, unruptured, complicated appendicitis. Appendix: Location: Inferior to cecum Diameter: 13 mm Appendicolith: Present Mucosal hyperenhancement: Present Extraluminal gas: None Periappendical collection: None  ASSESSMENT AND PLAN: LLQ ab pain Nausea GERD Patient presents with LLQ ab pain over the last year that has come-and-gone. No obvious inciting factors. Will plan for further evaluation with labs and CT imaging. Will also have the patient try Bentyl to see if this helps with her ab pain. She has previously been on several different NSAIDs, which could contribute to colitis, PUD, and/or gastritis/duodenitis. CT will also help with ruling out diverticulitis and evaluation for any underlying constipation. - Encouraged her to avoid NSAIDs - Bentyl PRN  - Check CRP, ESR, TSH, TTG IgA, IgA - Check CT A/P w/contrast - RTC 2  months  Christia Reading, MD

## 2022-08-06 NOTE — Patient Instructions (Addendum)
_______________________________________________________  If you are age 30 or older, your body mass index should be between 23-30. Your Body mass index is 39.46 kg/m. If this is out of the aforementioned range listed, please consider follow up with your Primary Care Provider.  If you are age 9 or younger, your body mass index should be between 19-25. Your Body mass index is 39.46 kg/m. If this is out of the aformentioned range listed, please consider follow up with your Primary Care Provider.   ________________________________________________________  The [facility] providers would like to encourage you to use [facility] to communicate with providers for non-urgent requests or questions.  Due to long hold times on the telephone, sending your provider a message by [facility] may be a faster and more efficient way to get a response.  Please allow 48 business hours for a response.  Please remember that this is for non-urgent requests.  _______________________________________________________  Your provider has requested that you go to the basement level for lab work before leaving today. Press "B" on the elevator. The lab is located at the first door on the left as you exit the elevator. You have been scheduled for a CT scan of the abdomen and pelvis at [facility], 1st floor Radiology. You are scheduled on 08/19/2022 at 8:30 am. You should arrive 30 minutes prior to your appointment time for registration. You will need to drink 2 cups of water at home before arriving for the exam.  Please follow the written instructions below on the day of your exam:   1) Do not eat anything after 4:30 am (4 hours prior to your test)   If you have any questions regarding your exam or if you need to reschedule, you may call [facility] at [phone] between the hours of 8:00 am and 5:00 pm, Monday-Friday.    We have sent the following medications to your pharmacy for you to pick up at your convenience:   Start taking Bentyl 10 mg 1 tablet 4 times daily as needed for pain  Due to recent changes in healthcare laws, you may see the results of your imaging and laboratory studies on MyChart before your provider has had a chance to review them.  We understand that in some cases there may be results that are confusing or concerning to you. Not all laboratory results come back in the same time frame and the provider may be waiting for multiple results in order to interpret others.  Please give [name] 48 hours in order for your provider to thoroughly review all the results before contacting the office for clarification of your results.    You are schedule to follow up on 10/06/2022 at 3:40 pm   Thank you for entrusting me with your care and choosing [facility].  Dr Lorenso Courier

## 2022-08-07 LAB — TISSUE TRANSGLUTAMINASE, IGA: (tTG) Ab, IgA: <1 U/mL

## 2022-08-07 LAB — IGA: Immunoglobulin A: 211 mg/dL (ref 47–310)

## 2022-08-09 LAB — TSH: TSH: 0.76 u[IU]/mL (ref 0.35–5.50)

## 2022-08-11 ENCOUNTER — Ambulatory Visit (HOSPITAL_COMMUNITY): Payer: Medicaid Other | Admitting: Student

## 2022-08-12 ENCOUNTER — Ambulatory Visit (INDEPENDENT_AMBULATORY_CARE_PROVIDER_SITE_OTHER): Payer: Medicaid Other | Admitting: Nurse Practitioner

## 2022-08-12 ENCOUNTER — Encounter: Payer: Self-pay | Admitting: Nurse Practitioner

## 2022-08-12 VITALS — BP 115/66 | HR 92 | Temp 98.4°F | Ht 66.0 in | Wt 246.0 lb

## 2022-08-12 DIAGNOSIS — M25552 Pain in left hip: Secondary | ICD-10-CM

## 2022-08-12 DIAGNOSIS — J302 Other seasonal allergic rhinitis: Secondary | ICD-10-CM | POA: Diagnosis not present

## 2022-08-12 DIAGNOSIS — N898 Other specified noninflammatory disorders of vagina: Secondary | ICD-10-CM | POA: Diagnosis not present

## 2022-08-12 MED ORDER — LEVOCETIRIZINE DIHYDROCHLORIDE 5 MG PO TABS: 5.0000 mg | ORAL_TABLET | Freq: Every evening | ORAL | 0 refills | Status: DC
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
## 2022-08-12 NOTE — Patient Instructions (Signed)
1. Pain, joint, pelvic region, left  - Ambulatory referral to Gynecology  2. Seasonal allergies  - levocetirizine (XYZAL ALLERGY 24HR) 5 MG tablet; Take 1 tablet (5 mg total) by mouth every evening.  Dispense: 30 tablet; Refill: 0  3. Vaginal discharge  - NuSwab Vaginitis Plus (VG+)   Follow up:  Follow up in 3 months or sooner if needed

## 2022-08-13 ENCOUNTER — Ambulatory Visit (INDEPENDENT_AMBULATORY_CARE_PROVIDER_SITE_OTHER): Payer: Medicaid Other | Admitting: Clinical

## 2022-08-13 ENCOUNTER — Encounter: Payer: Self-pay | Admitting: Nurse Practitioner

## 2022-08-13 DIAGNOSIS — F418 Other specified anxiety disorders: Secondary | ICD-10-CM

## 2022-08-13 DIAGNOSIS — F419 Anxiety disorder, unspecified: Secondary | ICD-10-CM

## 2022-08-13 DIAGNOSIS — M25552 Pain in left hip: Secondary | ICD-10-CM | POA: Insufficient documentation

## 2022-08-13 NOTE — Assessment & Plan Note (Signed)
-   Ambulatory referral to Gynecology  2. Seasonal allergies  - levocetirizine (XYZAL ALLERGY 24HR) 5 MG tablet; Take 1 tablet (5 mg total) by mouth every evening.  Dispense: 30 tablet; Refill: 0  3. Vaginal discharge  - NuSwab Vaginitis Plus (VG+)   Follow up:  Follow up in 3 months or sooner if needed

## 2022-08-15 LAB — NUSWAB VAGINITIS PLUS (VG+)
Atopobium vaginae: HIGH Score — AB
BVAB 2: HIGH Score — AB
Candida albicans, NAA: NEGATIVE
Candida glabrata, NAA: NEGATIVE
Chlamydia trachomatis, NAA: NEGATIVE
Neisseria gonorrhoeae, NAA: NEGATIVE
Trich vag by NAA: NEGATIVE

## 2022-08-16 ENCOUNTER — Other Ambulatory Visit: Payer: Self-pay | Admitting: Nurse Practitioner

## 2022-08-16 MED ORDER — METRONIDAZOLE 0.75 % VA GEL: 1.0000 | Freq: Two times a day (BID) | VAGINAL | 0 refills | Status: AC
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
## 2022-08-19 ENCOUNTER — Ambulatory Visit (HOSPITAL_COMMUNITY)
Admission: RE | Admit: 2022-08-19 | Discharge: 2022-08-19 | Disposition: A | Discharge status: Home / Self Care | Payer: Medicaid Other | Source: Ambulatory Visit | Attending: Internal Medicine | Admitting: Internal Medicine

## 2022-08-19 DIAGNOSIS — R1904 Left lower quadrant abdominal swelling, mass and lump: Secondary | ICD-10-CM | POA: Diagnosis present

## 2022-08-19 MED ORDER — IOHEXOL 300 MG/ML  SOLN
100.0000 mL | Freq: Once | INTRAMUSCULAR | Status: AC | PRN
  Administered 2022-08-19: 100 mL via INTRAVENOUS

## 2022-08-19 MED ORDER — SODIUM CHLORIDE (PF) 0.9 % IJ SOLN
INTRAMUSCULAR | Status: AC
  Filled 2022-08-19: qty 50
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
## 2022-08-20 ENCOUNTER — Ambulatory Visit (INDEPENDENT_AMBULATORY_CARE_PROVIDER_SITE_OTHER): Payer: Medicaid Other | Admitting: Clinical

## 2022-08-20 DIAGNOSIS — F32A Depression, unspecified: Secondary | ICD-10-CM | POA: Diagnosis not present

## 2022-08-20 DIAGNOSIS — F419 Anxiety disorder, unspecified: Secondary | ICD-10-CM | POA: Diagnosis not present

## 2022-08-21 NOTE — Congregational Nurse Program (Signed)
Member seen at [facility].  Taking a job readiness class.  Working on a resume.  Will try to get on Linkedin to help with job search.  Juliann Pulse, RN, Congregational Nurse, [phone].
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
## 2022-08-25 ENCOUNTER — Ambulatory Visit (INDEPENDENT_AMBULATORY_CARE_PROVIDER_SITE_OTHER): Payer: Medicaid Other | Admitting: Clinical

## 2022-08-25 DIAGNOSIS — F419 Anxiety disorder, unspecified: Secondary | ICD-10-CM | POA: Diagnosis not present

## 2022-08-25 DIAGNOSIS — F32A Depression, unspecified: Secondary | ICD-10-CM
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
## 2022-09-03 NOTE — Congregational Nurse Program (Signed)
Member seen at [facility].  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  Would be traveling.  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  Excited about opportunities.  Asked about bunkbeds.  Has an apt with small bedroom for kids.  Will see if any resources available.  Suggested she go on [employer].  Juliann Pulse, RN, Congregational Nurse, [phone].

## 2022-09-06 ENCOUNTER — Ambulatory Visit (INDEPENDENT_AMBULATORY_CARE_PROVIDER_SITE_OTHER): Payer: Medicaid Other | Admitting: Clinical

## 2022-09-06 DIAGNOSIS — F32A Depression, unspecified: Secondary | ICD-10-CM

## 2022-09-06 DIAGNOSIS — F419 Anxiety disorder, unspecified: Secondary | ICD-10-CM
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
## 2022-09-08 ENCOUNTER — Encounter: Payer: Self-pay | Admitting: Neurology

## 2022-09-08 ENCOUNTER — Ambulatory Visit (INDEPENDENT_AMBULATORY_CARE_PROVIDER_SITE_OTHER): Payer: Medicaid Other | Admitting: Neurology

## 2022-09-08 VITALS — BP 123/73 | HR 75 | Ht 66.0 in | Wt 245.0 lb

## 2022-09-08 DIAGNOSIS — G43009 Migraine without aura, not intractable, without status migrainosus: Secondary | ICD-10-CM

## 2022-09-08 MED ORDER — ONDANSETRON HCL 4 MG PO TABS: 4.0000 mg | ORAL_TABLET | Freq: Three times a day (TID) | ORAL | 5 refills | Status: DC | PRN
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
## 2022-09-08 NOTE — Patient Instructions (Signed)
Take Nurtec as needed for migraine (no more than 1 tablet in 24 hours).  Let me know if effective.

## 2022-09-20 ENCOUNTER — Ambulatory Visit (INDEPENDENT_AMBULATORY_CARE_PROVIDER_SITE_OTHER): Payer: Medicaid Other | Admitting: Clinical

## 2022-09-20 DIAGNOSIS — F419 Anxiety disorder, unspecified: Secondary | ICD-10-CM

## 2022-09-20 DIAGNOSIS — F32A Depression, unspecified: Secondary | ICD-10-CM | POA: Diagnosis not present
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
## 2022-09-21 ENCOUNTER — Ambulatory Visit: Payer: Medicaid Other | Admitting: Clinical

## 2022-09-22 ENCOUNTER — Telehealth: Payer: Self-pay | Admitting: Neurology

## 2022-09-22 MED ORDER — NURTEC 75 MG PO TBDP: 75.0000 mg | ORAL_TABLET | ORAL | 5 refills | Status: DC | PRN

## 2022-09-22 NOTE — Telephone Encounter (Signed)
Pt called in and left a message. She would like a prescription sent in for the Nurtec. She says she will definitely be needing it, it has been working well. [facility] [place].

## 2022-09-24 ENCOUNTER — Telehealth: Payer: Self-pay

## 2022-09-24 ENCOUNTER — Other Ambulatory Visit (HOSPITAL_COMMUNITY): Payer: Self-pay

## 2022-09-24 NOTE — Telephone Encounter (Signed)
Pharmacy Patient Advocate Encounter   Received notification from [facility] that prior authorization for Nurtec 75MG  dispersible tablets is required/requested.    PA submitted on 09/24/2022 to (ins) [employer] via CoverMyMeds  Key 09/26/2022  Status is pending

## 2022-09-28 NOTE — Telephone Encounter (Signed)
Pharmacy Patient Advocate Encounter  Prior Authorization for Nurtec 75MG  dispersible tablets has been approved.    PA# PA Case ID:  Effective dates: 09/28/2022 through 09/28/2023

## 2022-09-28 NOTE — Telephone Encounter (Signed)
Resubmitted the prior authorization via CoverMyMeds.  New Key: [id number]   Nurtec 75MG  dispersible tablets  Submitted on 09/28/2022

## 2022-10-05 ENCOUNTER — Ambulatory Visit (INDEPENDENT_AMBULATORY_CARE_PROVIDER_SITE_OTHER): Payer: Medicaid Other | Admitting: Clinical

## 2022-10-05 DIAGNOSIS — F419 Anxiety disorder, unspecified: Secondary | ICD-10-CM | POA: Diagnosis not present

## 2022-10-05 DIAGNOSIS — F32A Depression, unspecified: Secondary | ICD-10-CM | POA: Diagnosis not present
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
## 2022-10-06 ENCOUNTER — Encounter: Payer: Self-pay | Admitting: Internal Medicine

## 2022-10-06 ENCOUNTER — Ambulatory Visit: Payer: Medicaid Other | Admitting: Internal Medicine

## 2022-10-06 VITALS — BP 104/64 | HR 88 | Ht 66.0 in | Wt 246.0 lb

## 2022-10-06 DIAGNOSIS — R110 Nausea: Secondary | ICD-10-CM

## 2022-10-06 DIAGNOSIS — R1032 Left lower quadrant pain: Secondary | ICD-10-CM

## 2022-10-06 DIAGNOSIS — K219 Gastro-esophageal reflux disease without esophagitis: Secondary | ICD-10-CM

## 2022-10-06 MED ORDER — LINACLOTIDE 72 MCG PO CAPS: 72.0000 ug | ORAL_CAPSULE | Freq: Every day | ORAL | 0 refills | Status: DC

## 2022-10-06 NOTE — Patient Instructions (Addendum)
You will need to follow up in 3 months  If you are age 31 or older, your body mass index should be between 23-30. Your Body mass index is 39.71 kg/m. If this is out of the aforementioned range listed, please consider follow up with your Primary Care Provider.  If you are age 52 or younger, your body mass index should be between 19-25. Your Body mass index is 39.71 kg/m. If this is out of the aformentioned range listed, please consider follow up with your Primary Care Provider.   ________________________________________________________  The [facility] providers would like to encourage you to use [facility] to communicate with providers for non-urgent requests or questions.  Due to long hold times on the telephone, sending your provider a message by [facility] may be a faster and more efficient way to get a response.  Please allow 48 business hours for a response.  Please remember that this is for non-urgent requests.   Medication Samples have been provided to the patient.  Drug name: Linzess       Strength: 72        Qty: 8  LOT: [id number]  Exp.Date: 6/24  Dosing instructions: take 1 capsule daily before meals   The patient has been instructed regarding the correct time, dose, and frequency of taking this medication, including desired effects and most common side effects.   Thank you for entrusting me with your care and choosing [facility].  Dr Lorenso Courier

## 2022-10-06 NOTE — Progress Notes (Signed)
Chief Complaint: Ab pain  HPI : 32 year old female with history of asthma, GERD, and ADHD presents for follow up of ab pain  Interval History: LLQ ab pain has improved slightly, but she is still having some ab pain. The pain is in the lower abdomen, mostly in the LLQ. The Bentyl did help with the ab pain. She has been taking fiber. She has stopped her NSAIDs. Denies reflux issues. Her ab pain does feel better when she goes on a juice fast.  Current Outpatient Medications  Medication Sig Dispense Refill   acetaminophen (TYLENOL) 500 MG tablet Take 1,000 mg by mouth every 6 (six) hours as needed for moderate pain or headache.     dicyclomine (BENTYL) 10 MG capsule Take 1 capsule (10 mg total) by mouth 4 (four) times daily as needed for spasms. 120 capsule 2   gabapentin (NEURONTIN) 100 MG capsule Take 1 capsule (100 mg total) by mouth 3 (three) times daily. 90 capsule 3   ibuprofen (ADVIL) 200 MG tablet Take 400 mg by mouth every 6 (six) hours as needed for headache or moderate pain.     meloxicam (MOBIC) 15 MG tablet Take 1 tablet (15 mg total) by mouth daily. 30 tablet 1   methocarbamol (ROBAXIN) 500 MG tablet Take 1 tablet (500 mg total) by mouth 2 (two) times daily as needed for muscle spasms. 20 tablet 2   naproxen (NAPROSYN) 500 MG tablet Take 1 tablet (500 mg total) by mouth 2 (two) times daily. 30 tablet 0   ondansetron (ZOFRAN) 4 MG tablet Take 1 tablet (4 mg total) by mouth every 8 (eight) hours as needed for nausea or vomiting. 20 tablet 5   Rimegepant Sulfate (NURTEC) 75 MG TBDP Take 75 mg by mouth as needed. 16 tablet 5   levocetirizine (XYZAL ALLERGY 24HR) 5 MG tablet Take 1 tablet (5 mg total) by mouth every evening. 30 tablet 0   No current facility-administered medications for this visit.   Physical Exam: BP 104/64   Pulse 88   Ht _0  (1.676 m)   Wt 246 lb (111.6 kg)   SpO2 98%   BMI 39.71 kg/m  Constitutional: Pleasant,well-developed, female in no acute  distress. HEENT: Normocephalic and atraumatic. Conjunctivae are normal. No scleral icterus. Cardiovascular: Normal rate, regular rhythm.  Pulmonary/chest: Effort normal and breath sounds normal. No wheezing, rales or rhonchi. Abdominal: Soft, nondistended, tender in the LLQ. Bowel sounds active throughout. There are no masses palpable. No hepatomegaly. Extremities: No edema Neurological: Alert and oriented to person place and time. Skin: Skin is warm and dry. No rashes noted. Psychiatric: Normal mood and affect. Behavior is normal.  Labs 03/2022: CBC nml. CMP unremarkable  Labs 08/2022: TTG IgA nml. IgA nml. CRP nml. ESR mildly elevated at 48. TSH nml.  CT A/P w/contrast 12/21/21: IMPRESSION: Acute, unruptured, complicated appendicitis. Appendix: Location: Inferior to cecum Diameter: 13 mm Appendicolith: Present Mucosal hyperenhancement: Present Extraluminal gas: None Periappendical collection: None  CT A/P w/contrast 08/19/22: IMPRESSION: No acute intra-abdominal or pelvic pathology.  ASSESSMENT AND PLAN: LLQ ab pain Nausea GERD Elevated ESR Patient presents with LLQ ab pain over the last year that has come-and-gone. CT and labs were only notable for a mildly elevated ESR. She has responded to Bentyl therapy, which could suggest an IBS-related component of her ab pain. I encouraged her to try the low FODMAP diet to see if she is able to identify food triggers for her pain. She does mention that juice  fasts seem to help with her pain, which could suggest a food sensitivity. Will also give her a trial of Linzess 72 mcg QD to see if constipation could be contributing as well.   - Patient has stopped NSAIDs - Low FODMAP diet - Bentyl PRN  - Give samples of Linzess 72 mcg QD to try for 7 days to see if this helps with her ab pain - RTC 3 months  Christia Reading, MD  I spent 34 minutes of time, including in depth chart review, independent review of results as outlined above,  communicating results with the patient directly, face-to-face time with the patient, coordinating care, and ordering studies and medications as appropriate, and documentation.

## 2022-10-07 ENCOUNTER — Telehealth: Payer: Self-pay | Admitting: Internal Medicine

## 2022-10-07 NOTE — Telephone Encounter (Signed)
Spoke with the patient and discussed Linzess.  For LINZESS to work best, take it every day. This is different from over-the-counter laxatives, which are often taken as needed. When Deanna Myers is taken daily, constipation relief is typically felt in about 1 week. IBS-C patients may begin to experience relief of belly pain and overall abdominal symptoms* (pain, discomfort, and bloating) in about 1 week, with symptoms typically improving over 12 weeks. If you miss a dose, take your dose as usual the following day. Do not take 2 doses at the same time to make up for a missed dose. Take your pill at least 30 minutes before your first meal at about the same time of day.  We will talk again in a week to follow up on her progress.  The patient was given Linzess 72 mcg.

## 2022-10-07 NOTE — Telephone Encounter (Addendum)
Patient called states she was  given a sample of Linzess yesterday, she took it this morning at about 7:00 am and has not had any BM's.

## 2022-10-08 ENCOUNTER — Ambulatory Visit: Payer: Medicaid Other | Admitting: Clinical

## 2022-10-08 ENCOUNTER — Telehealth: Payer: Self-pay | Admitting: Clinical
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
## 2022-10-14 NOTE — Telephone Encounter (Signed)
No answer. Left message to call if she was having any concerns, had any questions or needed any further recommendations.

## 2022-10-18 ENCOUNTER — Ambulatory Visit (INDEPENDENT_AMBULATORY_CARE_PROVIDER_SITE_OTHER): Payer: Medicaid Other | Admitting: Clinical

## 2022-10-18 DIAGNOSIS — F418 Other specified anxiety disorders: Secondary | ICD-10-CM | POA: Diagnosis not present

## 2022-10-18 DIAGNOSIS — F419 Anxiety disorder, unspecified: Secondary | ICD-10-CM
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
## 2022-10-19 DIAGNOSIS — F902 Attention-deficit hyperactivity disorder, combined type: Secondary | ICD-10-CM | POA: Diagnosis not present

## 2022-10-19 DIAGNOSIS — F6381 Intermittent explosive disorder: Secondary | ICD-10-CM | POA: Diagnosis not present

## 2022-10-22 ENCOUNTER — Encounter: Payer: Self-pay | Admitting: Nurse Practitioner

## 2022-10-22 ENCOUNTER — Ambulatory Visit (INDEPENDENT_AMBULATORY_CARE_PROVIDER_SITE_OTHER): Payer: Medicaid Other | Admitting: Nurse Practitioner

## 2022-10-22 VITALS — BP 111/59 | HR 90 | Temp 98.4°F | Wt 240.4 lb

## 2022-10-22 DIAGNOSIS — R051 Acute cough: Secondary | ICD-10-CM

## 2022-10-22 DIAGNOSIS — J069 Acute upper respiratory infection, unspecified: Secondary | ICD-10-CM | POA: Diagnosis not present

## 2022-10-22 DIAGNOSIS — J029 Acute pharyngitis, unspecified: Secondary | ICD-10-CM

## 2022-10-22 LAB — POCT INFLUENZA A/B
Influenza A, POC: NEGATIVE
Influenza B, POC: NEGATIVE

## 2022-10-22 LAB — POC COVID19 BINAXNOW: SARS Coronavirus 2 Ag: NEGATIVE

## 2022-10-22 MED ORDER — BENZONATATE 100 MG PO CAPS: 100.0000 mg | ORAL_CAPSULE | Freq: Two times a day (BID) | ORAL | 0 refills | Status: DC | PRN

## 2022-10-22 MED ORDER — AMOXICILLIN-POT CLAVULANATE 875-125 MG PO TABS: 1.0000 | ORAL_TABLET | Freq: Two times a day (BID) | ORAL | 0 refills | Status: DC

## 2022-10-22 MED ORDER — PREDNISONE 20 MG PO TABS: 20.0000 mg | ORAL_TABLET | Freq: Every day | ORAL | 0 refills | Status: AC

## 2022-10-22 NOTE — Assessment & Plan Note (Signed)
-  POC COVID-19 BinaxNow - POCT Influenza A/B  2. Sore throat  - POC COVID-19 BinaxNow - POCT Influenza A/B  3. Upper respiratory tract infection, unspecified type  - amoxicillin-clavulanate (AUGMENTIN) 875-125 MG tablet; Take 1 tablet by mouth 2 (two) times daily.  Dispense: 20 tablet; Refill: 0 - predniSONE (DELTASONE) 20 MG tablet; Take 1 tablet (20 mg total) by mouth daily with breakfast for 5 days.  Dispense: 5 tablet; Refill: 0  Follow up:  Follow up as scheduled
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
## 2022-10-22 NOTE — Patient Instructions (Signed)
1. Acute cough  - POC COVID-19 BinaxNow - POCT Influenza A/B  2. Sore throat  - POC COVID-19 BinaxNow - POCT Influenza A/B  3. Upper respiratory tract infection, unspecified type  - amoxicillin-clavulanate (AUGMENTIN) 875-125 MG tablet; Take 1 tablet by mouth 2 (two) times daily.  Dispense: 20 tablet; Refill: 0 - predniSONE (DELTASONE) 20 MG tablet; Take 1 tablet (20 mg total) by mouth daily with breakfast for 5 days.  Dispense: 5 tablet; Refill: 0  Follow up:  Follow up as scheduled

## 2022-10-28 ENCOUNTER — Ambulatory Visit (INDEPENDENT_AMBULATORY_CARE_PROVIDER_SITE_OTHER): Payer: Medicaid Other | Admitting: Clinical

## 2022-10-28 DIAGNOSIS — F419 Anxiety disorder, unspecified: Secondary | ICD-10-CM

## 2022-10-28 DIAGNOSIS — F32A Depression, unspecified: Secondary | ICD-10-CM
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
## 2022-11-09 ENCOUNTER — Telehealth: Payer: Self-pay | Admitting: Neurology

## 2022-11-09 ENCOUNTER — Telehealth: Payer: Self-pay | Admitting: Nurse Practitioner

## 2022-11-09 NOTE — Telephone Encounter (Signed)
How frequent or the headaches (on average, how many days a week/month are they occurring)?   How long do the headaches last?  Week And half Verify what preventative medication and dose you are taking (e.g. topiramate, propranolol, amitriptyline, Emgality, etc)  Deferred at her last visit 09/2022 Verify which rescue medication you are taking (triptan, Advil, Excedrin, Aleve, Ubrelvy, etc)  Nurtec given after her visit.  How often are you taking pain relievers/analgesics/rescue mediction?       LMOVm for patient to give the office a call back. We can offer a headache cocktail patient will need a driver( Ask Dr.Jaffe first)  or Prednisone taper.

## 2022-11-09 NOTE — Telephone Encounter (Signed)
Pt called and asking about referrals  She hasn't received no calls from no one

## 2022-11-09 NOTE — Telephone Encounter (Signed)
Pt called in and left a message. She has been having a recurring migraine for about a week and half. Normally her medication makes it go away, but its not working this time.

## 2022-11-10 ENCOUNTER — Ambulatory Visit (INDEPENDENT_AMBULATORY_CARE_PROVIDER_SITE_OTHER): Payer: Medicaid Other | Admitting: Nurse Practitioner

## 2022-11-10 ENCOUNTER — Encounter: Payer: Self-pay | Admitting: Nurse Practitioner

## 2022-11-10 VITALS — BP 132/64 | HR 95 | Temp 97.3°F | Ht 66.0 in | Wt 242.8 lb

## 2022-11-10 DIAGNOSIS — M25572 Pain in left ankle and joints of left foot: Secondary | ICD-10-CM | POA: Diagnosis not present

## 2022-11-10 DIAGNOSIS — G8929 Other chronic pain: Secondary | ICD-10-CM

## 2022-11-10 DIAGNOSIS — M79671 Pain in right foot: Secondary | ICD-10-CM | POA: Diagnosis not present

## 2022-11-10 DIAGNOSIS — R102 Pelvic and perineal pain: Secondary | ICD-10-CM

## 2022-11-10 NOTE — Telephone Encounter (Signed)
Per Patient for the last week the migraines are more frequent.   Per Patient she is not a fan of Steroids.

## 2022-11-10 NOTE — Telephone Encounter (Signed)
Pt left message stating she has been having recurrent migraines and has to take up to 2 Nurtec on the same day because the pain wont go away. States she has a follow up appointment in June, she would like to come sooner.

## 2022-11-10 NOTE — Progress Notes (Signed)
$@Patienty$  ID: Deanna Myers, female    DOB: [birth year], 32 y.o.   MRN: [id number]  Chief Complaint  Patient presents with   Consult    Referring provider: Fenton Foy, NP   HPI  Patient presents today for an acute visit.  Patient complains today of right foot pain and also chronic left ankle pain with swelling.  We will order an x-ray of her right foot today.  Will refer patient to orthopedics for further evaluation.    Allergies  Allergen Reactions   Shellfish Allergy Anaphylaxis   Adhesive [Tape] Swelling and Other (See Comments)    Reaction:  Bruising    Latex Hives   Betadine [Povidone Iodine] Rash    Immunization History  Administered Date(s) Administered   Pneumococcal Polysaccharide-23 01/17/2014   Tdap 01/17/2014, 10/08/2015    Past Medical History:  Diagnosis Date   ADHD (attention deficit hyperactivity disorder)    since childhood   Asthma    inhaler last used prior to pregnancy   BV (bacterial vaginosis)    Chlamydia    GERD (gastroesophageal reflux disease)    Gonorrhea    Hyperemesis    G3    Tobacco History: Social History   Tobacco Use  Smoking Status Never  Smokeless Tobacco Never   Counseling given: Not Answered   Outpatient Encounter Medications as of 11/10/2022  Medication Sig   acetaminophen (TYLENOL) 500 MG tablet Take 1,000 mg by mouth every 6 (six) hours as needed for moderate pain or headache.   ibuprofen (ADVIL) 200 MG tablet Take 400 mg by mouth every 6 (six) hours as needed for headache or moderate pain.   levocetirizine (XYZAL ALLERGY 24HR) 5 MG tablet Take 1 tablet (5 mg total) by mouth every evening.   meloxicam (MOBIC) 15 MG tablet Take 1 tablet (15 mg total) by mouth daily.   methocarbamol (ROBAXIN) 500 MG tablet Take 1 tablet (500 mg total) by mouth 2 (two) times daily as needed for muscle spasms.   naproxen (NAPROSYN) 500 MG tablet Take 1 tablet (500 mg total) by mouth 2 (two) times daily.   ondansetron (ZOFRAN)  4 MG tablet Take 1 tablet (4 mg total) by mouth every 8 (eight) hours as needed for nausea or vomiting.   amoxicillin-clavulanate (AUGMENTIN) 875-125 MG tablet Take 1 tablet by mouth 2 (two) times daily. (Patient not taking: Reported on 11/10/2022)   benzonatate (TESSALON) 100 MG capsule Take 1 capsule (100 mg total) by mouth 2 (two) times daily as needed for cough. (Patient not taking: Reported on 11/10/2022)   dicyclomine (BENTYL) 10 MG capsule Take 1 capsule (10 mg total) by mouth 4 (four) times daily as needed for spasms. (Patient not taking: Reported on 10/22/2022)   gabapentin (NEURONTIN) 100 MG capsule Take 1 capsule (100 mg total) by mouth 3 (three) times daily. (Patient not taking: Reported on 10/22/2022)   linaclotide (LINZESS) 72 MCG capsule Take 1 capsule (72 mcg total) by mouth daily before breakfast. (Patient not taking: Reported on 11/10/2022)   [DISCONTINUED] albuterol (PROVENTIL HFA;VENTOLIN HFA) 108 (90 BASE) MCG/ACT inhaler Inhale 1-2 puffs into the lungs every 6 (six) hours as needed for wheezing or shortness of breath.   [DISCONTINUED] omeprazole (PRILOSEC) 20 MG capsule Take 1 capsule (20 mg total) by mouth 2 (two) times daily. (Patient not taking: Reported on 02/10/2018)   [DISCONTINUED] SUMAtriptan (IMITREX) 100 MG tablet Take 1 tablet earliest onset of migraine.  May repeat once in 2 hours if headache persists or recurs.  Do not exceed 2 tablets in 24 hours   No facility-administered encounter medications on file as of 11/10/2022.     Review of Systems  Review of Systems  Constitutional: Negative.   HENT: Negative.    Cardiovascular: Negative.   Gastrointestinal: Negative.   Musculoskeletal:        Right foot pain. Left ankle pain and swelling.  Allergic/Immunologic: Negative.   Neurological: Negative.   Psychiatric/Behavioral: Negative.         Physical Exam  BP 132/64   Pulse 95   Temp (!) 97.3 F (36.3 C)   Ht 5' 6"$  (1.676 m)   Wt 242 lb 12.8 oz (110.1 kg)   LMP  11/08/2022   SpO2 100%   BMI 39.19 kg/m   Wt Readings from Last 5 Encounters:  11/10/22 242 lb 12.8 oz (110.1 kg)  10/22/22 240 lb 6.4 oz (109 kg)  10/06/22 246 lb (111.6 kg)  09/08/22 245 lb (111.1 kg)  08/12/22 246 lb (111.6 kg)     Physical Exam Vitals and nursing note reviewed.  Constitutional:      General: She is not in acute distress.    Appearance: She is well-developed.  Cardiovascular:     Rate and Rhythm: Normal rate and regular rhythm.  Pulmonary:     Effort: Pulmonary effort is normal.     Breath sounds: Normal breath sounds.  Neurological:     Mental Status: She is alert and oriented to person, place, and time.      Lab Results:  CBC    Component Value Date/Time   WBC 6.8 04/01/2022 1128   WBC 7.8 12/20/2021 1916   RBC 4.47 04/01/2022 1128   RBC 4.49 12/20/2021 1916   HGB 12.3 04/01/2022 1128   HCT 37.4 04/01/2022 1128   PLT 358 04/01/2022 1128   MCV 84 04/01/2022 1128   MCH 27.5 04/01/2022 1128   MCH 26.9 12/20/2021 1916   MCHC 32.9 04/01/2022 1128   MCHC 32.4 12/20/2021 1916   RDW 14.2 04/01/2022 1128   LYMPHSABS 3.6 05/14/2021 1753   MONOABS 0.4 05/14/2021 1753   EOSABS 0.2 05/14/2021 1753   BASOSABS 0.0 05/14/2021 1753    BMET    Component Value Date/Time   NA 138 04/01/2022 1128   K 4.4 04/01/2022 1128   CL 100 04/01/2022 1128   CO2 26 04/01/2022 1128   GLUCOSE 67 (L) 04/01/2022 1128   GLUCOSE 86 12/20/2021 1916   BUN 8 04/01/2022 1128   CREATININE 0.63 04/01/2022 1128   CALCIUM 9.5 04/01/2022 1128   GFRNONAA >60 12/20/2021 1916   GFRAA >60 07/03/2017 1620      Assessment & Plan:   Right foot pain - AMB referral to orthopedics - DG Foot Complete Right  2. Chronic pain of left ankle  - AMB referral to orthopedics  Follow up:  Follow up as needed     Fenton Foy, NP 11/15/2022

## 2022-11-10 NOTE — Patient Instructions (Signed)
1. Right foot pain  - AMB referral to orthopedics - DG Foot Complete Right  2. Chronic pain of left ankle  - AMB referral to orthopedics  Follow up:  Follow up as needed

## 2022-11-11 ENCOUNTER — Ambulatory Visit (INDEPENDENT_AMBULATORY_CARE_PROVIDER_SITE_OTHER): Payer: Medicaid Other | Admitting: Clinical

## 2022-11-11 ENCOUNTER — Ambulatory Visit (HOSPITAL_COMMUNITY)
Admission: RE | Admit: 2022-11-11 | Discharge: 2022-11-11 | Disposition: A | Discharge status: Home / Self Care | Payer: Medicaid Other | Source: Ambulatory Visit | Attending: Nurse Practitioner | Admitting: Nurse Practitioner

## 2022-11-11 DIAGNOSIS — F32A Depression, unspecified: Secondary | ICD-10-CM | POA: Diagnosis not present

## 2022-11-11 DIAGNOSIS — M79671 Pain in right foot: Secondary | ICD-10-CM | POA: Insufficient documentation

## 2022-11-11 DIAGNOSIS — F419 Anxiety disorder, unspecified: Secondary | ICD-10-CM | POA: Diagnosis not present

## 2022-11-12 ENCOUNTER — Other Ambulatory Visit: Payer: Self-pay | Admitting: Neurology

## 2022-11-12 MED ORDER — SUMATRIPTAN 20 MG/ACT NA SOLN: 20.0000 mg | NASAL | 5 refills | Status: DC | PRN

## 2022-11-12 NOTE — Telephone Encounter (Signed)
Pt called back in and left a message yesterday afternoon. She is currently having a migraine. She took Naproxin early this morning, but it has not gone away. She just took another one and is waiting on it to subside. She would like to know what she can do?
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
## 2022-11-12 NOTE — Telephone Encounter (Signed)
Pt called in returning [facility] call

## 2022-11-12 NOTE — Telephone Encounter (Signed)
Script sent to [facility] on [address]

## 2022-11-12 NOTE — Telephone Encounter (Signed)
Patient agrees to have the Sumatriptan nasal spray sent to the Pharmacy in the system.

## 2022-11-12 NOTE — Telephone Encounter (Signed)
For breakthrough migraines that do not respond to Nurtec, I can prescribe her sumatriptan nasal spray or injection.  Those medications will get into her system much faster than a pill.  Again, this can be used as a second line (only if Nurtec doesn't work).

## 2022-11-12 NOTE — Telephone Encounter (Signed)
LMOVm for patient to call the office back.

## 2022-11-15 ENCOUNTER — Encounter: Payer: Self-pay | Admitting: Nurse Practitioner

## 2022-11-15 DIAGNOSIS — M79671 Pain in right foot: Secondary | ICD-10-CM | POA: Insufficient documentation

## 2022-11-15 NOTE — Assessment & Plan Note (Signed)
-   AMB referral to orthopedics - DG Foot Complete Right  2. Chronic pain of left ankle  - AMB referral to orthopedics  Follow up:  Follow up as needed

## 2022-11-18 DIAGNOSIS — F902 Attention-deficit hyperactivity disorder, combined type: Secondary | ICD-10-CM | POA: Diagnosis not present

## 2022-11-18 DIAGNOSIS — F6381 Intermittent explosive disorder: Secondary | ICD-10-CM | POA: Diagnosis not present

## 2022-11-23 ENCOUNTER — Ambulatory Visit: Payer: Medicaid Other | Admitting: Orthopaedic Surgery

## 2022-11-23 DIAGNOSIS — M25572 Pain in left ankle and joints of left foot: Secondary | ICD-10-CM | POA: Diagnosis not present

## 2022-11-23 DIAGNOSIS — M79671 Pain in right foot: Secondary | ICD-10-CM | POA: Diagnosis not present
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
## 2022-11-25 ENCOUNTER — Ambulatory Visit: Payer: Medicaid Other | Admitting: Clinical

## 2022-11-26 DIAGNOSIS — F902 Attention-deficit hyperactivity disorder, combined type: Secondary | ICD-10-CM | POA: Diagnosis not present

## 2022-11-26 DIAGNOSIS — F6381 Intermittent explosive disorder: Secondary | ICD-10-CM | POA: Diagnosis not present

## 2022-11-30 ENCOUNTER — Encounter: Payer: Self-pay | Admitting: Internal Medicine

## 2022-12-02 ENCOUNTER — Ambulatory Visit (INDEPENDENT_AMBULATORY_CARE_PROVIDER_SITE_OTHER): Payer: Medicaid Other | Admitting: Clinical

## 2022-12-02 DIAGNOSIS — F419 Anxiety disorder, unspecified: Secondary | ICD-10-CM

## 2022-12-02 DIAGNOSIS — F32A Depression, unspecified: Secondary | ICD-10-CM
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
## 2022-12-06 ENCOUNTER — Encounter: Payer: Self-pay | Admitting: Orthopedic Surgery

## 2022-12-06 ENCOUNTER — Ambulatory Visit: Payer: Medicaid Other | Admitting: Orthopedic Surgery

## 2022-12-06 DIAGNOSIS — M722 Plantar fascial fibromatosis: Secondary | ICD-10-CM | POA: Diagnosis not present

## 2022-12-06 DIAGNOSIS — M6701 Short Achilles tendon (acquired), right ankle: Secondary | ICD-10-CM

## 2022-12-06 NOTE — Progress Notes (Signed)
Office Visit Note   Patient: Deanna Myers           Date of Birth: 04/17/1991           MRN: [id number] Visit Date: 12/06/2022              Requested by: Fenton Foy, NP [phone] [place],  [place] [address] PCP: Fenton Foy, NP  Chief Complaint  Patient presents with   Right Foot - Follow-up      HPI: Patient is a 32 year old woman who is seen for right heel pain posterior medial.  Patient's pain is worse with start up standing and walking.  Nonsteroidals have not helped.  Patient states that the symptoms started about a year ago without injury.  Assessment & Plan: Visit Diagnoses:  1. Plantar fasciitis of right foot   2. Achilles tendon contracture, right     Plan: Patient was given instructions and demonstrated Achilles stretching recommended Voltaren gel stiff soled sneaker and will have her follow-up with Dr. Rolena Infante for evaluation for shockwave therapy  Follow-Up Instructions: Return if symptoms worsen or fail to improve.   Ortho Exam  Patient is alert, oriented, no adenopathy, well-dressed, normal affect, normal respiratory effort. Examination patient has good pulses she has dorsiflexion only to neutral with her knee extended.  Lateral compression of the calcaneus is nontender the peroneal and posterior tibial tendon are nontender to palpation.  Patient is point tender to palpation over the medial aspect of the origin of the plantar fascia.  There are no nodules.  Imaging: No results found. No images are attached to the encounter.  Labs: Lab Results  Component Value Date   ESRSEDRATE 48 (H) 08/06/2022   CRP <1.0 08/06/2022   REPTSTATUS 06/14/2015 FINAL 06/12/2015   CULT  06/12/2015    >=100,000 COLONIES/mL ESCHERICHIA COLI NO GROUP B STREP (S.AGALACTIAE) ISOLATED Performed at [facility] 06/12/2015     Lab Results  Component Value Date   ALBUMIN 4.6 04/01/2022   ALBUMIN 4.1 12/20/2021    ALBUMIN 4.3 05/14/2021    No results found for: "MG" No results found for: "VD25OH"  No results found for: "PREALBUMIN"    Latest Ref Rng & Units 04/01/2022   11:28 AM 12/20/2021    7:16 PM 05/14/2021    5:53 PM  CBC EXTENDED  WBC 3.4 - 10.8 x10E3/uL 6.8  7.8  7.1   RBC 3.77 - 5.28 x10E6/uL 4.47  4.49  4.42   Hemoglobin 11.1 - 15.9 g/dL 12.3  12.1  12.3   HCT 34.0 - 46.6 % 37.4  37.3  37.8   Platelets 150 - 450 x10E3/uL 358  379  274   NEUT# 1.7 - 7.7 K/uL   2.9   Lymph# 0.7 - 4.0 K/uL   3.6      There is no height or weight on file to calculate BMI.  Orders:  No orders of the defined types were placed in this encounter.  No orders of the defined types were placed in this encounter.    Procedures: No procedures performed  Clinical Data: No additional findings.  ROS:  All other systems negative, except as noted in the HPI. Review of Systems  Objective: Vital Signs: LMP 11/08/2022   Specialty Comments:  No specialty comments available.  PMFS History: Patient Active Problem List   Diagnosis Date Noted   Right foot pain 11/15/2022   Acute cough 10/22/2022  Pain, joint, pelvic region, left 08/13/2022   Left leg pain 06/30/2022   Healthcare maintenance 04/01/2022   Active labor at term 12/21/2015   UTI (urinary tract infection) 06/13/2015   Pyelonephritis affecting pregnancy in first trimester [id number]   Folliculitis [id number]   Past Medical History:  Diagnosis Date   ADHD (attention deficit hyperactivity disorder)    since childhood   Asthma    inhaler last used prior to pregnancy   BV (bacterial vaginosis)    Chlamydia    GERD (gastroesophageal reflux disease)    Gonorrhea    Hyperemesis    G3    Family History  Problem Relation Age of Onset   Hypertension Father    Hypertension Mother    Kidney disease Mother    Cervical cancer Paternal Aunt    Congestive Heart Failure Maternal Grandmother    Breast cancer Paternal Grandfather     Past  Surgical History:  Procedure Laterality Date   CESAREAN SECTION N/A 12/21/2015   Procedure: CESAREAN SECTION;  Surgeon: Paula Compton, MD;  Location: [facility];  Service: Obstetrics;  Laterality: N/A;   LAPAROSCOPIC APPENDECTOMY N/A 12/21/2021   Procedure: APPENDECTOMY LAPAROSCOPIC;  Surgeon: Dwan Bolt, MD;  Location: [facility];  Service: General;  Laterality: N/A;   Social History   Occupational History   Occupation: warehouse  Tobacco Use   Smoking status: Never   Smokeless tobacco: Never  Vaping Use   Vaping Use: Never used  Substance and Sexual Activity   Alcohol use: Yes    Comment: social   Drug use: No   Sexual activity: Yes    Partners: Male    Birth control/protection: None

## 2022-12-09 ENCOUNTER — Other Ambulatory Visit: Payer: Self-pay

## 2022-12-09 ENCOUNTER — Telehealth: Payer: Self-pay | Admitting: Neurology

## 2022-12-09 ENCOUNTER — Encounter: Payer: Self-pay | Admitting: Physical Therapy

## 2022-12-09 ENCOUNTER — Ambulatory Visit: Payer: Medicaid Other | Attending: Nurse Practitioner | Admitting: Physical Therapy

## 2022-12-09 DIAGNOSIS — M5459 Other low back pain: Secondary | ICD-10-CM | POA: Insufficient documentation

## 2022-12-09 DIAGNOSIS — R293 Abnormal posture: Secondary | ICD-10-CM

## 2022-12-09 DIAGNOSIS — R102 Pelvic and perineal pain: Secondary | ICD-10-CM | POA: Diagnosis not present

## 2022-12-09 DIAGNOSIS — R279 Unspecified lack of coordination: Secondary | ICD-10-CM | POA: Diagnosis not present

## 2022-12-09 DIAGNOSIS — M6281 Muscle weakness (generalized): Secondary | ICD-10-CM | POA: Diagnosis not present

## 2022-12-09 DIAGNOSIS — M62838 Other muscle spasm: Secondary | ICD-10-CM | POA: Diagnosis not present

## 2022-12-09 NOTE — Telephone Encounter (Signed)
Pt called in stating a nasal spray was supposed to have been called into her pharmacy, but the pharmacy doesn't have a prescription. [facility] [address]

## 2022-12-09 NOTE — Telephone Encounter (Signed)
Advised patient script was sent in 11/12/22.  Patient will call the pharmacy to see and give [name] a call back.
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
## 2022-12-13 ENCOUNTER — Ambulatory Visit (INDEPENDENT_AMBULATORY_CARE_PROVIDER_SITE_OTHER): Payer: Medicaid Other | Admitting: Sports Medicine

## 2022-12-13 ENCOUNTER — Encounter: Payer: Self-pay | Admitting: Sports Medicine

## 2022-12-13 DIAGNOSIS — M2141 Flat foot [pes planus] (acquired), right foot: Secondary | ICD-10-CM | POA: Diagnosis not present

## 2022-12-13 DIAGNOSIS — M2142 Flat foot [pes planus] (acquired), left foot: Secondary | ICD-10-CM

## 2022-12-13 DIAGNOSIS — M722 Plantar fascial fibromatosis: Secondary | ICD-10-CM | POA: Diagnosis not present

## 2022-12-13 NOTE — Progress Notes (Signed)
Deanna Myers - 32 y.o. female MRN [id number]  Date of birth: 1990/12/17  Office Visit Note: Visit Date: 12/13/2022 PCP: Fenton Foy, NP Referred by: Fenton Foy, NP  Subjective: Chief Complaint  Patient presents with   Right Foot - Follow-up   HPI: Deanna Myers is a pleasant 32 y.o. female who presents today for right foot pain with plantar fasciitis.  Pain and symptoms x 1 year Pain is located over the plantar aspect of the heel, also extends into the arch of the foot Is aware that she has flatfeet Pain is worse with prolonged standing, also worse when she gets up from prolonged recumbency Just began doing some stretching exercises Dr. Sharol Given gave her, Tylenol as needed  Pertinent ROS were reviewed with the patient and found to be negative unless otherwise specified above in HPI.   Assessment & Plan: Visit Diagnoses:  1. Plantar fasciitis of right foot   2. Pes planus of both feet    Plan: Discussed with Maleyah the nature of her pain and plantar fascia.  I do think that her flatfeet is certainly contributing.  She also has some pain in the arch which I do think is more from her loss of longitudinal arch.  Through shared decision-making, did elect to proceed with a trial of extracorporeal shockwave therapy, she tolerated well.  I do think she would benefit from some insoles or orthotics, she will bring her tennis shoes with her at next visit.  Advised against barefoot walking.  We did print out a customized handout for her plantar fasciitis, my athletic trainer Lilia Pro, did review this home exercise plan for her to perform once daily. May use ice, tylenol as needed for pain. F/u in 1 week.  Follow-up: Return in about 1 week (around 12/20/2022) for for right plantar fascia (bring tennis shoes with you to next visit).   Meds & Orders: No orders of the defined types were placed in this encounter.  No orders of the defined types were placed in this encounter.     Procedures: Procedure: ECSWT Indications: Planta fasciitis   Procedure Details Consent: Risks of procedure as well as the alternatives and risks of each were explained to the patient.  Verbal consent for procedure obtained. Time Out: Verified patient identification, verified procedure, site was marked, verified correct patient position. The area was cleaned with alcohol swab.     The right plantar fascia was targeted for Extracorporeal shockwave therapy.    Preset: Plantar fasciitis Power Level: 90 mJ Frequency: 10 Hz Impulse/cycles: 2500 Head size: Regular   Patient tolerated procedure well without immediate complications.         Clinical History: No specialty comments available.  She reports that she has never smoked. She has never used smokeless tobacco. No results for input(s): "HGBA1C", "LABURIC" in the last 8760 hours.  Objective:    Physical Exam  Gen: Well-appearing, in no acute distress; non-toxic CV:  Well-perfused. Warm.  Resp: Breathing unlabored on room air; no wheezing. Psych: Fluid speech in conversation; appropriate affect; normal thought process Neuro: Sensation intact throughout. No gross coordination deficits.   Ortho Exam - Right foot: Positive TTP over the insertion of the plantar aspect of the calcaneus.  There are some mild TTP palpating over the plantar aspect foot near the arch.  No redness or swelling.  There is some mild restriction in dorsiflexion of about 90 degrees, 5/5 strength. NVI.  - Gait analysis: There is notable loss of longitudinal  arch upon standing.  Notable pes planovalgus.  There is excessive hyperpronation through gait phase.  Imaging: No results found.  Past Medical/Family/Surgical/Social History: Medications & Allergies reviewed per EMR, new medications updated. Patient Active Problem List   Diagnosis Date Noted   Right foot pain 11/15/2022   Acute cough 10/22/2022   Pain, joint, pelvic region, left 08/13/2022   Left  leg pain 06/30/2022   Healthcare maintenance 04/01/2022   Active labor at term 12/21/2015   UTI (urinary tract infection) 06/13/2015   Pyelonephritis affecting pregnancy in first trimester [id number]   Folliculitis [id number]   Past Medical History:  Diagnosis Date   ADHD (attention deficit hyperactivity disorder)    since childhood   Asthma    inhaler last used prior to pregnancy   BV (bacterial vaginosis)    Chlamydia    GERD (gastroesophageal reflux disease)    Gonorrhea    Hyperemesis    G3   Family History  Problem Relation Age of Onset   Hypertension Father    Hypertension Mother    Kidney disease Mother    Cervical cancer Paternal Aunt    Congestive Heart Failure Maternal Grandmother    Breast cancer Paternal Grandfather    Past Surgical History:  Procedure Laterality Date   CESAREAN SECTION N/A 12/21/2015   Procedure: CESAREAN SECTION;  Surgeon: Myers Compton, MD;  Location: [facility];  Service: Obstetrics;  Laterality: N/A;   LAPAROSCOPIC APPENDECTOMY N/A 12/21/2021   Procedure: APPENDECTOMY LAPAROSCOPIC;  Surgeon: Dwan Bolt, MD;  Location: [facility];  Service: General;  Laterality: N/A;   Social History   Occupational History   Occupation: warehouse  Tobacco Use   Smoking status: Never   Smokeless tobacco: Never  Vaping Use   Vaping Use: Never used  Substance and Sexual Activity   Alcohol use: Yes    Comment: social   Drug use: No   Sexual activity: Yes    Partners: Male    Birth control/protection: None

## 2022-12-13 NOTE — Progress Notes (Signed)
Duda referral for shockwave on right foot/ankle 1+ year of pain No treatment thus far except she does state she stretches which doesn't help much  Patient was instructed in 10 minutes of therapeutic exercises for right foot pain to improve strength, ROM and function according to my instructions and plan of care by a Certified Athletic Trainer during the office visit. A customized handout was provided and demonstration of proper technique shown and discussed. Patient did perform exercises and demonstrate understanding through teachback.  All questions discussed and answered.

## 2022-12-14 ENCOUNTER — Encounter: Payer: Self-pay | Admitting: Physical Therapy

## 2022-12-14 ENCOUNTER — Ambulatory Visit: Payer: Medicaid Other | Admitting: Physical Therapy

## 2022-12-14 DIAGNOSIS — M5459 Other low back pain: Secondary | ICD-10-CM

## 2022-12-14 DIAGNOSIS — R279 Unspecified lack of coordination: Secondary | ICD-10-CM | POA: Diagnosis not present

## 2022-12-14 DIAGNOSIS — R293 Abnormal posture: Secondary | ICD-10-CM

## 2022-12-14 DIAGNOSIS — R102 Pelvic and perineal pain: Secondary | ICD-10-CM | POA: Diagnosis not present

## 2022-12-14 DIAGNOSIS — M62838 Other muscle spasm: Secondary | ICD-10-CM | POA: Diagnosis not present

## 2022-12-14 DIAGNOSIS — M6281 Muscle weakness (generalized): Secondary | ICD-10-CM | POA: Diagnosis not present
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
## 2022-12-16 ENCOUNTER — Ambulatory Visit: Payer: Medicaid Other | Admitting: Physical Therapy

## 2022-12-16 ENCOUNTER — Ambulatory Visit (INDEPENDENT_AMBULATORY_CARE_PROVIDER_SITE_OTHER): Payer: Medicaid Other

## 2022-12-16 ENCOUNTER — Other Ambulatory Visit: Payer: Self-pay | Admitting: Nurse Practitioner

## 2022-12-16 ENCOUNTER — Ambulatory Visit (INDEPENDENT_AMBULATORY_CARE_PROVIDER_SITE_OTHER): Payer: Medicaid Other | Admitting: Clinical

## 2022-12-16 VITALS — BP 132/54 | Temp 97.3°F

## 2022-12-16 DIAGNOSIS — F419 Anxiety disorder, unspecified: Secondary | ICD-10-CM | POA: Diagnosis not present

## 2022-12-16 DIAGNOSIS — F32A Depression, unspecified: Secondary | ICD-10-CM | POA: Diagnosis not present

## 2022-12-16 DIAGNOSIS — M62838 Other muscle spasm: Secondary | ICD-10-CM | POA: Diagnosis not present

## 2022-12-16 DIAGNOSIS — M5459 Other low back pain: Secondary | ICD-10-CM | POA: Diagnosis not present

## 2022-12-16 DIAGNOSIS — M6281 Muscle weakness (generalized): Secondary | ICD-10-CM

## 2022-12-16 DIAGNOSIS — R293 Abnormal posture: Secondary | ICD-10-CM

## 2022-12-16 DIAGNOSIS — R279 Unspecified lack of coordination: Secondary | ICD-10-CM | POA: Diagnosis not present

## 2022-12-16 DIAGNOSIS — N898 Other specified noninflammatory disorders of vagina: Secondary | ICD-10-CM | POA: Diagnosis not present

## 2022-12-16 DIAGNOSIS — R102 Pelvic and perineal pain: Secondary | ICD-10-CM | POA: Diagnosis not present

## 2022-12-16 LAB — POCT URINALYSIS DIPSTICK
Bilirubin, UA: NEGATIVE
Glucose, UA: NEGATIVE
Ketones, UA: NEGATIVE
Nitrite, UA: NEGATIVE
Protein, UA: NEGATIVE
Spec Grav, UA: >=1.03 — AB (ref 1.010–1.025)
Urobilinogen, UA: 0.2 E.U./dL
pH, UA: 5.5 (ref 5.0–8.0)

## 2022-12-16 MED ORDER — SULFAMETHOXAZOLE-TRIMETHOPRIM 800-160 MG PO TABS: 1.0000 | ORAL_TABLET | Freq: Two times a day (BID) | ORAL | 0 refills | Status: DC
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
## 2022-12-17 ENCOUNTER — Other Ambulatory Visit: Payer: Self-pay | Admitting: Nurse Practitioner

## 2022-12-17 ENCOUNTER — Ambulatory Visit (INDEPENDENT_AMBULATORY_CARE_PROVIDER_SITE_OTHER): Payer: Medicaid Other | Admitting: Nurse Practitioner

## 2022-12-17 ENCOUNTER — Encounter (INDEPENDENT_AMBULATORY_CARE_PROVIDER_SITE_OTHER): Payer: Self-pay | Admitting: Nurse Practitioner

## 2022-12-17 ENCOUNTER — Ambulatory Visit (INDEPENDENT_AMBULATORY_CARE_PROVIDER_SITE_OTHER): Payer: Medicaid Other

## 2022-12-17 ENCOUNTER — Other Ambulatory Visit (INDEPENDENT_AMBULATORY_CARE_PROVIDER_SITE_OTHER): Payer: Self-pay | Admitting: Nurse Practitioner

## 2022-12-17 VITALS — BP 111/75 | HR 84 | Resp 18 | Ht 66.0 in | Wt 241.8 lb

## 2022-12-17 DIAGNOSIS — I890 Lymphedema, not elsewhere classified: Secondary | ICD-10-CM

## 2022-12-17 DIAGNOSIS — M79606 Pain in leg, unspecified: Secondary | ICD-10-CM

## 2022-12-17 DIAGNOSIS — M7989 Other specified soft tissue disorders: Secondary | ICD-10-CM

## 2022-12-17 DIAGNOSIS — M25552 Pain in left hip: Secondary | ICD-10-CM

## 2022-12-17 MED ORDER — CEPHALEXIN 500 MG PO CAPS: 500.0000 mg | ORAL_CAPSULE | Freq: Two times a day (BID) | ORAL | 0 refills | Status: AC

## 2022-12-18 LAB — URINE CULTURE

## 2022-12-20 ENCOUNTER — Ambulatory Visit: Payer: Medicaid Other | Admitting: Sports Medicine

## 2022-12-20 ENCOUNTER — Encounter: Payer: Self-pay | Admitting: Sports Medicine

## 2022-12-20 DIAGNOSIS — M9905 Segmental and somatic dysfunction of pelvic region: Secondary | ICD-10-CM | POA: Diagnosis not present

## 2022-12-20 DIAGNOSIS — M2141 Flat foot [pes planus] (acquired), right foot: Secondary | ICD-10-CM

## 2022-12-20 DIAGNOSIS — M25551 Pain in right hip: Secondary | ICD-10-CM

## 2022-12-20 DIAGNOSIS — M2142 Flat foot [pes planus] (acquired), left foot: Secondary | ICD-10-CM | POA: Diagnosis not present

## 2022-12-20 DIAGNOSIS — M25552 Pain in left hip: Secondary | ICD-10-CM | POA: Diagnosis not present

## 2022-12-20 DIAGNOSIS — M722 Plantar fascial fibromatosis: Secondary | ICD-10-CM

## 2022-12-20 MED ORDER — MELOXICAM 15 MG PO TABS: 15.0000 mg | ORAL_TABLET | Freq: Every day | ORAL | 0 refills | Status: DC

## 2022-12-20 NOTE — Progress Notes (Signed)
Deanna Myers - 32 y.o. female MRN [id number]  Date of birth: 1991-03-18  Office Visit Note: Visit Date: 12/20/2022 PCP: Fenton Foy, NP Referred by: Fenton Foy, NP  Subjective: No chief complaint on file.  HPI: Deanna Myers is a pleasant 32 y.o. female who presents today for right plantar fascia pain.   Vascular surgery note on 12/17/22 -reviewed today, Eulogio Ditch, FNP.  Patient has chronic lymphedema.  Also thought that some of leg pain was coming from the hip. She has had pain of bilateral hips.  Pain is more so over the lateral aspect of the hip.  Very occasionally go into the groin.  She is seeing pelvic floor physical therapy, has noted that her hips are "off." Has seen chiropractor in the past with some relief. No medicine for this.  Plantar fasciitis - had some improvement for first few days after trial of ECSWT. Did bring her tennis shoes in today. Notes she wears through the heels and the plantar aspect of feet. Not taking any medicine for this.  Pertinent ROS were reviewed with the patient and found to be negative unless otherwise specified above in HPI.   Assessment & Plan: Visit Diagnoses:  1. Plantar fasciitis of right foot   2. Pes planus of both feet   3. Bilateral hip pain    Plan: Discussed with Chandrea for her foot and plantar fascia pain, we need to support her arch.  She was placed into green sports insoles with scaphoid pad which improved her longitudinal arch loss and hyperpronation.  We did do a second trial of extracorporeal shockwave therapy, we will follow-up in 2 weeks and if this is at least 25% improved we will consider additional treatments.  Discussed appropriate shoewear with supportive shoes given she stands on her feet for long periods of time.  She did have some somatic dysfunction of her hips, we did perform OMT today, which she tolerated well.  Do think she will continue having benefit from pelvic floor physical therapy, this will  hopefully help with some of her left lower extremity lymphedema.  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  It sounds more posttraumatic in nature.  To help with any swelling of both her hip and foot pain, we will do a trial of meloxicam 15 mg to be taken once daily for 2 weeks, will likely then go to as needed but will see me at that time.  Follow-up: Return in about 2 weeks (around 01/03/2023) for for plantar fascia/feet.   Meds & Orders:  Meds ordered this encounter  Medications   meloxicam (MOBIC) 15 MG tablet    Sig: Take 1 tablet (15 mg total) by mouth daily.    Dispense:  30 tablet    Refill:  0   No orders of the defined types were placed in this encounter.    Procedures: Procedure: ECSWT Indications: Planta fasciitis   Procedure Details Consent: Risks of procedure as well as the alternatives and risks of each were explained to the patient.  Verbal consent for procedure obtained. Time Out: Verified patient identification, verified procedure, site was marked, verified correct patient position. The area was cleaned with alcohol swab.     The right plantar fascia was targeted for Extracorporeal shockwave therapy.    Preset: Plantar fasciitis Power Level: 90 mJ  Frequency: 10 Hz Impulse/cycles: 2500 Head size: Regular  Patient tolerated procedure well without immediate complications      OMT Treatment performed today: - Bilateral hips: Myofascial release, HVLA  Clinical History: No specialty comments available.  She reports that she has never smoked. She has never used smokeless tobacco. No results for input(s): "HGBA1C", "LABURIC" in the last 8760 hours.  Objective:    Physical Exam  Gen: Well-appearing, in no acute distress; non-toxic CV: Regular Rate. Well-perfused. Warm.  Resp: Breathing unlabored on room air; no wheezing. Psych: Fluid speech in conversation; appropriate affect;  normal thought process Neuro: Sensation intact throughout. No gross coordination deficits.   Ortho Exam - Bilateral feet/ankle: There is notable pes planovalgus with loss of longitudinal arch upon standing. There is excessive hyperpronation throughout gait phase.  Positive TTP over the insertion of the plantar aspect of the calcaneus, as well as palpating down the plantar aspect of the foot.  No redness or warmth.  5/5 strength and full range of motion about the ankle in all directions. NVI.  The left foot is slightly larger than the right.  Hyperpronation is corrected after the insertion of the shoe insoles and arch pad  - Pelvis/Hips: There is mild TTP palpating over the greater trochanters left greater than right.  There is fluid range of motion with internal and external rotation.  There is some pain at the lateral hip with FADIR, no pain within the groin.  Negative FABER testing.  There is 5/5 strength of the hip in all directions.  - Osteopathic Examination: *There is somatic dysfunction of the pelvis/hip region *There is anterior innominate rotation of the left hip with approximately 1/2 cm short leg - corrected after manipulation  Imaging:  *Independent review and interpretation of left hip x-rays from 03/22/2022 were performed by myself today.  X-rays demonstrate a cortical regularity off of the lesser trochanter without displacement.  There is overhanging acetabular rim bilaterally, indicative of pincer deformity.  DG Foot Complete Right CLINICAL DATA:  Pain  EXAM: RIGHT FOOT COMPLETE - 3 VIEW  COMPARISON:  None Available.  FINDINGS: There is no evidence of fracture or dislocation. There is no evidence of arthropathy or other focal bone abnormality. Soft tissues are unremarkable.  IMPRESSION: Negative.  Electronically Signed   By: Sammie Bench M.D.   On: 11/13/2022 00:34    Past Medical/Family/Surgical/Social History: Medications & Allergies reviewed per EMR, new  medications updated. Patient Active Problem List   Diagnosis Date Noted   Right foot pain 11/15/2022   Acute cough 10/22/2022   Pain, joint, pelvic region, left 08/13/2022   Left leg pain 06/30/2022   Healthcare maintenance 04/01/2022   Active labor at term 12/21/2015   UTI (urinary tract infection) 06/13/2015   Pyelonephritis affecting pregnancy in first trimester [id number]   Folliculitis [id number]   Past Medical History:  Diagnosis Date   ADHD (attention deficit hyperactivity disorder)    since childhood   Asthma    inhaler last used prior to pregnancy   BV (bacterial vaginosis)    Chlamydia    GERD (gastroesophageal reflux disease)    Gonorrhea    Hyperemesis    G3   Family History  Problem Relation Age of Onset   Hypertension Father    Hypertension Mother    Kidney disease Mother    Cervical cancer Paternal Aunt    Congestive Heart Failure Maternal Grandmother    Breast cancer Paternal Grandfather    Past Surgical History:  Procedure Laterality Date   CESAREAN SECTION N/A 12/21/2015  Procedure: CESAREAN SECTION;  Surgeon: Myers Compton, MD;  Location: [facility];  Service: Obstetrics;  Laterality: N/A;   LAPAROSCOPIC APPENDECTOMY N/A 12/21/2021   Procedure: APPENDECTOMY LAPAROSCOPIC;  Surgeon: Dwan Bolt, MD;  Location: [facility];  Service: General;  Laterality: N/A;   Social History   Occupational History   Occupation: warehouse  Tobacco Use   Smoking status: Never   Smokeless tobacco: Never  Vaping Use   Vaping Use: Never used  Substance and Sexual Activity   Alcohol use: Yes    Comment: social   Drug use: No   Sexual activity: Yes    Partners: Male    Birth control/protection: None

## 2022-12-20 NOTE — Progress Notes (Signed)
Doing ok; states she has not noticed much improvement yet.  Did see her vascular surgeon which helped her understand her pains on her left side.
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
## 2022-12-23 ENCOUNTER — Ambulatory Visit: Payer: Medicaid Other | Admitting: Physical Therapy
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
## 2022-12-27 ENCOUNTER — Telehealth: Payer: Self-pay

## 2022-12-27 ENCOUNTER — Other Ambulatory Visit: Payer: Self-pay

## 2022-12-27 ENCOUNTER — Other Ambulatory Visit: Payer: Self-pay | Admitting: Nurse Practitioner

## 2022-12-27 ENCOUNTER — Ambulatory Visit: Payer: Medicaid Other | Admitting: Nurse Practitioner

## 2022-12-27 ENCOUNTER — Ambulatory Visit (HOSPITAL_COMMUNITY)
Admission: RE | Admit: 2022-12-27 | Discharge: 2022-12-27 | Disposition: A | Discharge status: Home / Self Care | Payer: Medicaid Other | Source: Ambulatory Visit | Attending: Nurse Practitioner | Admitting: Nurse Practitioner

## 2022-12-27 VITALS — BP 113/66 | HR 90 | Temp 97.8°F | Wt 251.6 lb

## 2022-12-27 DIAGNOSIS — R600 Localized edema: Secondary | ICD-10-CM

## 2022-12-27 MED ORDER — FUROSEMIDE 20 MG PO TABS: 20.0000 mg | ORAL_TABLET | Freq: Every day | ORAL | 0 refills | Status: DC

## 2022-12-27 MED ORDER — PREDNISONE 20 MG PO TABS: 20.0000 mg | ORAL_TABLET | Freq: Every day | ORAL | 0 refills | Status: AC

## 2022-12-27 MED ORDER — FUROSEMIDE 20 MG PO TABS: 20.0000 mg | ORAL_TABLET | Freq: Every day | ORAL | 3 refills | Status: DC

## 2022-12-27 NOTE — Progress Notes (Signed)
@Patient  ID: Deanna Myers, female    DOB: 11-26-1990, 32 y.o.   MRN: [id number]  Chief Complaint  Patient presents with   Leg Pain    Both     Referring provider: Fenton Foy, NP   HPI  32 year old female with no significant health history.  Patient presents today for peripheral edema.  She has been followed by vascular for this.  She does have a 10 pound weight gain since her last visit with vascular on 12/17/2022.  We will start her on Lasix today.  We will order ultrasound due to pain with swelling.  Refer patient to cardiology for further evaluation as well. Denies f/c/s, n/v/d, hemoptysis, PND, leg swelling Denies chest pain or edema      Allergies  Allergen Reactions   Shellfish Allergy Anaphylaxis   Adhesive [Tape] Swelling and Other (See Comments)    Reaction:  Bruising    Latex Hives   Betadine [Povidone Iodine] Rash    Immunization History  Administered Date(s) Administered   Pneumococcal Polysaccharide-23 01/17/2014   Tdap 01/17/2014, 10/08/2015    Past Medical History:  Diagnosis Date   ADHD (attention deficit hyperactivity disorder)    since childhood   Asthma    inhaler last used prior to pregnancy   BV (bacterial vaginosis)    Chlamydia    GERD (gastroesophageal reflux disease)    Gonorrhea    Hyperemesis    G3    Tobacco History: Social History   Tobacco Use  Smoking Status Never  Smokeless Tobacco Never   Counseling given: Not Answered   Outpatient Encounter Medications as of 12/27/2022  Medication Sig   acetaminophen (TYLENOL) 500 MG tablet Take 1,000 mg by mouth every 6 (six) hours as needed for moderate pain or headache.   dicyclomine (BENTYL) 10 MG capsule Take 1 capsule (10 mg total) by mouth 4 (four) times daily as needed for spasms.   meloxicam (MOBIC) 15 MG tablet Take 1 tablet (15 mg total) by mouth daily.   ondansetron (ZOFRAN) 4 MG tablet Take 1 tablet (4 mg total) by mouth every 8 (eight) hours as needed for  nausea or vomiting.   predniSONE (DELTASONE) 20 MG tablet Take 1 tablet (20 mg total) by mouth daily with breakfast for 5 days.   Rimegepant Sulfate (NURTEC PO) Take by mouth.   SUMAtriptan (IMITREX) 20 MG/ACT nasal spray Place 1 spray (20 mg total) into the nose as needed for migraine or headache. May repeat in 2 hours if headache persists or recurs.  Maximum doses in 24 hours.   [DISCONTINUED] furosemide (LASIX) 20 MG tablet Take 1 tablet (20 mg total) by mouth daily.   benzonatate (TESSALON) 100 MG capsule Take 1 capsule (100 mg total) by mouth 2 (two) times daily as needed for cough. (Patient not taking: Reported on 12/27/2022)   furosemide (LASIX) 20 MG tablet Take 1 tablet (20 mg total) by mouth daily for 5 days.   gabapentin (NEURONTIN) 100 MG capsule Take 1 capsule (100 mg total) by mouth 3 (three) times daily. (Patient not taking: Reported on 12/27/2022)   ibuprofen (ADVIL) 200 MG tablet Take 400 mg by mouth every 6 (six) hours as needed for headache or moderate pain. (Patient not taking: Reported on 12/27/2022)   levocetirizine (XYZAL ALLERGY 24HR) 5 MG tablet Take 1 tablet (5 mg total) by mouth every evening. (Patient not taking: Reported on 12/27/2022)   linaclotide (LINZESS) 72 MCG capsule Take 1 capsule (72 mcg total) by mouth daily  before breakfast. (Patient not taking: Reported on 12/27/2022)   methocarbamol (ROBAXIN) 500 MG tablet Take 1 tablet (500 mg total) by mouth 2 (two) times daily as needed for muscle spasms. (Patient not taking: Reported on 12/27/2022)   [DISCONTINUED] albuterol (PROVENTIL HFA;VENTOLIN HFA) 108 (90 BASE) MCG/ACT inhaler Inhale 1-2 puffs into the lungs every 6 (six) hours as needed for wheezing or shortness of breath.   [DISCONTINUED] omeprazole (PRILOSEC) 20 MG capsule Take 1 capsule (20 mg total) by mouth 2 (two) times daily. (Patient not taking: Reported on 02/10/2018)   [DISCONTINUED] SUMAtriptan (IMITREX) 100 MG tablet Take 1 tablet earliest onset of migraine.   May repeat once in 2 hours if headache persists or recurs.  Do not exceed 2 tablets in 24 hours   No facility-administered encounter medications on file as of 12/27/2022.     Review of Systems  Review of Systems  Constitutional: Negative.   HENT: Negative.    Cardiovascular:  Positive for leg swelling.  Gastrointestinal: Negative.   Allergic/Immunologic: Negative.   Neurological: Negative.   Psychiatric/Behavioral: Negative.         Physical Exam  BP 113/66   Pulse 90   Temp 97.8 F (36.6 C)   Wt 251 lb 9.6 oz (114.1 kg)   SpO2 100%   BMI 40.61 kg/m   Wt Readings from Last 5 Encounters:  12/27/22 251 lb 9.6 oz (114.1 kg)  12/17/22 241 lb 12.8 oz (109.7 kg)  11/10/22 242 lb 12.8 oz (110.1 kg)  10/22/22 240 lb 6.4 oz (109 kg)  10/06/22 246 lb (111.6 kg)     Physical Exam Vitals and nursing note reviewed.  Constitutional:      General: She is not in acute distress.    Appearance: She is well-developed.  Cardiovascular:     Rate and Rhythm: Normal rate and regular rhythm.  Pulmonary:     Effort: Pulmonary effort is normal.     Breath sounds: Normal breath sounds.  Musculoskeletal:     Right lower leg: 2+ Edema present.     Left lower leg: 2+ Edema present.  Neurological:     Mental Status: She is alert and oriented to person, place, and time.      Lab Results:  CBC    Component Value Date/Time   WBC 6.8 04/01/2022 1128   WBC 7.8 12/20/2021 1916   RBC 4.47 04/01/2022 1128   RBC 4.49 12/20/2021 1916   HGB 12.3 04/01/2022 1128   HCT 37.4 04/01/2022 1128   PLT 358 04/01/2022 1128   MCV 84 04/01/2022 1128   MCH 27.5 04/01/2022 1128   MCH 26.9 12/20/2021 1916   MCHC 32.9 04/01/2022 1128   MCHC 32.4 12/20/2021 1916   RDW 14.2 04/01/2022 1128   LYMPHSABS 3.6 05/14/2021 1753   MONOABS 0.4 05/14/2021 1753   EOSABS 0.2 05/14/2021 1753   BASOSABS 0.0 05/14/2021 1753    BMET    Component Value Date/Time   NA 138 04/01/2022 1128   K 4.4 04/01/2022  1128   CL 100 04/01/2022 1128   CO2 26 04/01/2022 1128   GLUCOSE 67 (L) 04/01/2022 1128   GLUCOSE 86 12/20/2021 1916   BUN 8 04/01/2022 1128   CREATININE 0.63 04/01/2022 1128   CALCIUM 9.5 04/01/2022 1128   GFRNONAA >60 12/20/2021 1916   GFRAA >60 07/03/2017 1620    BNP No results found for: "BNP"  ProBNP No results found for: "PROBNP"  Imaging: VAS [name] IVC/ILIAC (VENOUS ONLY)  Result Date: 12/20/2022  IVC/ILIAC STUDY Patient Name:  DESHAUNA KEEN  Date of Exam:   12/17/2022 Medical Rec #: [id number]          Accession #:    [id number] Date of Birth: 1991/07/30          Patient Gender: F Patient Age:   50 years Exam Location:  [facility] Procedure:      VAS [name] IVC/ILIAC (VENOUS ONLY) Referring Phys: Eulogio Ditch --------------------------------------------------------------------------------  Risk Factors: No history of smoking. Other Factors: Left leg edema.  Performing Technologist: Delorise Shiner RVT  Examination Guidelines: A complete evaluation includes B-mode imaging, spectral Doppler, color Doppler, and power Doppler as needed of all accessible portions of each vessel. Bilateral testing is considered an integral part of a complete examination. Limited examinations for reoccurring indications may be performed as noted.  IVC/Iliac Findings: +----------+------+--------+--------+    IVC    PatentThrombusComments +----------+------+--------+--------+ IVC Prox  patent                 +----------+------+--------+--------+ IVC Mid   patent                 +----------+------+--------+--------+ IVC Distalpatent                 +----------+------+--------+--------+  +-------------------+---------+-----------+---------+-----------+--------+         CIV        RT-PatentRT-ThrombusLT-PatentLT-ThrombusComments +-------------------+---------+-----------+---------+-----------+--------+ Common Iliac Prox   patent              patent                       +-------------------+---------+-----------+---------+-----------+--------+ Common Iliac Mid                        patent                      +-------------------+---------+-----------+---------+-----------+--------+ Common Iliac Distal                     patent                      +-------------------+---------+-----------+---------+-----------+--------+  +-------------------------+---------+-----------+---------+-----------+--------+            EIV           RT-PatentRT-ThrombusLT-PatentLT-ThrombusComments +-------------------------+---------+-----------+---------+-----------+--------+ External Iliac Vein Prox                      patent                      +-------------------------+---------+-----------+---------+-----------+--------+ External Iliac Vein Mid                       patent                      +-------------------------+---------+-----------+---------+-----------+--------+ External Iliac Vein                           patent                      Distal                                                                    +-------------------------+---------+-----------+---------+-----------+--------+  Summary: IVC/Iliac: No evidence of thrombus in IVC and Iliac veins. No obvious extrinsic compression of left iliac veins.  *See table(s) above for measurements and observations.  Electronically signed by Leotis Pain MD on 12/20/2022 at 12:53:25 PM.    Final      Assessment & Plan:   Lower extremity edema - predniSONE (DELTASONE) 20 MG tablet; Take 1 tablet (20 mg total) by mouth daily with breakfast for 5 days.  Dispense: 5 tablet; Refill: 0 - CBC - Comprehensive metabolic panel - VAS [name] LOWER EXTREMITY VENOUS (DVT) - furosemide (LASIX) 20 MG tablet; Take 1 tablet (20 mg total) by mouth daily for 5 days.  Dispense: 5 tablet; Refill: 0  - Ambulatory referral to Cardiology   Follow up:  Follow up in 2 weeks     Fenton Foy,  NP 12/27/2022

## 2022-12-27 NOTE — Patient Instructions (Addendum)
1. Lower extremity edema  - predniSONE (DELTASONE) 20 MG tablet; Take 1 tablet (20 mg total) by mouth daily with breakfast for 5 days.  Dispense: 5 tablet; Refill: 0 - CBC - Comprehensive metabolic panel - VAS [name] LOWER EXTREMITY VENOUS (DVT) - furosemide (LASIX) 20 MG tablet; Take 1 tablet (20 mg total) by mouth daily for 5 days.  Dispense: 5 tablet; Refill: 0  - Ambulatory referral to Cardiology   Follow up:  Follow up in 2 weeks

## 2022-12-27 NOTE — Assessment & Plan Note (Signed)
-   predniSONE (DELTASONE) 20 MG tablet; Take 1 tablet (20 mg total) by mouth daily with breakfast for 5 days.  Dispense: 5 tablet; Refill: 0 - CBC - Comprehensive metabolic panel - VAS [name] LOWER EXTREMITY VENOUS (DVT) - furosemide (LASIX) 20 MG tablet; Take 1 tablet (20 mg total) by mouth daily for 5 days.  Dispense: 5 tablet; Refill: 0  - Ambulatory referral to Cardiology   Follow up:  Follow up in 2 weeks

## 2022-12-27 NOTE — Progress Notes (Signed)
Error

## 2022-12-27 NOTE — Telephone Encounter (Signed)
Patient called stating insurance is not covering furosemide. Wanting it be switched to another medication.

## 2022-12-27 NOTE — Telephone Encounter (Signed)
Called pt back to medication is ready fpr pick up with a 4 dollar copay. [place]

## 2022-12-28 ENCOUNTER — Other Ambulatory Visit (INDEPENDENT_AMBULATORY_CARE_PROVIDER_SITE_OTHER): Payer: Self-pay | Admitting: Nurse Practitioner

## 2022-12-28 DIAGNOSIS — M7989 Other specified soft tissue disorders: Secondary | ICD-10-CM

## 2022-12-28 DIAGNOSIS — I890 Lymphedema, not elsewhere classified: Secondary | ICD-10-CM

## 2022-12-28 LAB — COMPREHENSIVE METABOLIC PANEL
ALT: 12 IU/L (ref 0–32)
AST: 16 IU/L (ref 0–40)
Albumin/Globulin Ratio: 1.3 (ref 1.2–2.2)
Albumin: 3.9 g/dL — ABNORMAL LOW (ref 4.0–5.0)
Alkaline Phosphatase: 55 IU/L (ref 44–121)
BUN/Creatinine Ratio: 10 (ref 9–23)
BUN: 7 mg/dL (ref 6–20)
Bilirubin Total: 0.3 mg/dL (ref 0.0–1.2)
CO2: 24 mmol/L (ref 20–29)
Calcium: 9.2 mg/dL (ref 8.7–10.2)
Chloride: 105 mmol/L (ref 96–106)
Creatinine, Ser: 0.67 mg/dL (ref 0.57–1.00)
Globulin, Total: 2.9 g/dL (ref 1.5–4.5)
Glucose: 90 mg/dL (ref 70–99)
Potassium: 4.4 mmol/L (ref 3.5–5.2)
Sodium: 141 mmol/L (ref 134–144)
Total Protein: 6.8 g/dL (ref 6.0–8.5)
eGFR: 121 mL/min/{1.73_m2} (ref >59)

## 2022-12-28 LAB — CBC
Hematocrit: 35.2 % (ref 34.0–46.6)
Hemoglobin: 11.6 g/dL (ref 11.1–15.9)
MCH: 27.7 pg (ref 26.6–33.0)
MCHC: 33 g/dL (ref 31.5–35.7)
MCV: 84 fL (ref 79–97)
Platelets: 371 10*3/uL (ref 150–450)
RBC: 4.19 x10E6/uL (ref 3.77–5.28)
RDW: 14.5 % (ref 11.7–15.4)
WBC: 6.1 10*3/uL (ref 3.4–10.8)

## 2022-12-28 LAB — URIC ACID: Uric Acid: 4.4 mg/dL (ref 2.6–6.2)

## 2022-12-29 ENCOUNTER — Other Ambulatory Visit: Payer: Self-pay | Admitting: Nurse Practitioner

## 2022-12-29 DIAGNOSIS — R770 Abnormality of albumin: Secondary | ICD-10-CM

## 2022-12-29 DIAGNOSIS — R609 Edema, unspecified: Secondary | ICD-10-CM

## 2022-12-29 NOTE — Telephone Encounter (Signed)
Error

## 2022-12-30 ENCOUNTER — Encounter: Payer: Self-pay | Admitting: Physical Therapy

## 2023-01-03 ENCOUNTER — Encounter: Payer: Self-pay | Admitting: Sports Medicine

## 2023-01-03 ENCOUNTER — Ambulatory Visit (INDEPENDENT_AMBULATORY_CARE_PROVIDER_SITE_OTHER): Payer: Medicaid Other | Admitting: Clinical

## 2023-01-03 ENCOUNTER — Ambulatory Visit (INDEPENDENT_AMBULATORY_CARE_PROVIDER_SITE_OTHER): Payer: Medicaid Other | Admitting: Sports Medicine

## 2023-01-03 DIAGNOSIS — M2142 Flat foot [pes planus] (acquired), left foot: Secondary | ICD-10-CM

## 2023-01-03 DIAGNOSIS — R600 Localized edema: Secondary | ICD-10-CM | POA: Diagnosis not present

## 2023-01-03 DIAGNOSIS — M25552 Pain in left hip: Secondary | ICD-10-CM

## 2023-01-03 DIAGNOSIS — M722 Plantar fascial fibromatosis: Secondary | ICD-10-CM | POA: Diagnosis not present

## 2023-01-03 DIAGNOSIS — F32A Depression, unspecified: Secondary | ICD-10-CM | POA: Diagnosis not present

## 2023-01-03 DIAGNOSIS — M25551 Pain in right hip: Secondary | ICD-10-CM | POA: Diagnosis not present

## 2023-01-03 DIAGNOSIS — F419 Anxiety disorder, unspecified: Secondary | ICD-10-CM | POA: Diagnosis not present

## 2023-01-03 DIAGNOSIS — M2141 Flat foot [pes planus] (acquired), right foot: Secondary | ICD-10-CM | POA: Diagnosis not present
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
## 2023-01-03 NOTE — Progress Notes (Signed)
Pain and swelling Just finished prednisone for swelling in the legs Started back meloxicam today
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
## 2023-01-05 ENCOUNTER — Encounter: Payer: Self-pay | Admitting: Physical Therapy

## 2023-01-05 ENCOUNTER — Ambulatory Visit: Payer: Medicaid Other | Attending: Nurse Practitioner | Admitting: Physical Therapy

## 2023-01-05 DIAGNOSIS — R279 Unspecified lack of coordination: Secondary | ICD-10-CM

## 2023-01-05 DIAGNOSIS — M62838 Other muscle spasm: Secondary | ICD-10-CM

## 2023-01-05 DIAGNOSIS — M5459 Other low back pain: Secondary | ICD-10-CM | POA: Diagnosis not present

## 2023-01-05 DIAGNOSIS — M6281 Muscle weakness (generalized): Secondary | ICD-10-CM

## 2023-01-05 DIAGNOSIS — R293 Abnormal posture: Secondary | ICD-10-CM | POA: Diagnosis not present
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
## 2023-01-07 ENCOUNTER — Other Ambulatory Visit: Payer: Self-pay

## 2023-01-07 DIAGNOSIS — J302 Other seasonal allergic rhinitis: Secondary | ICD-10-CM

## 2023-01-07 MED ORDER — LEVOCETIRIZINE DIHYDROCHLORIDE 5 MG PO TABS
5.0000 mg | ORAL_TABLET | Freq: Every evening | ORAL | 1 refills | Status: DC
Start: 2023-01-07 — End: 2023-02-16

## 2023-01-07 NOTE — Telephone Encounter (Signed)
From: [name] To: Office of Ivonne Andrew, NP Sent: 01/07/2023 7:02 AM EDT Subject: Medication Renewal Request  Refills have been requested for the following medications:   levocetirizine (XYZAL ALLERGY 24HR) 5 MG tablet Deanna Myers]  Preferred pharmacy: [id number] - [place], [place] - [address] AT [facility] OF [address] Delivery method: Daryll Drown

## 2023-01-10 ENCOUNTER — Ambulatory Visit: Payer: Medicaid Other | Admitting: Sports Medicine

## 2023-01-11 ENCOUNTER — Ambulatory Visit: Payer: Medicaid Other | Admitting: Physical Therapy
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
## 2023-01-17 ENCOUNTER — Ambulatory Visit: Payer: Medicaid Other | Admitting: Clinical

## 2023-01-17 ENCOUNTER — Ambulatory Visit: Payer: Medicaid Other | Admitting: Physical Therapy
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
## 2023-01-18 ENCOUNTER — Ambulatory Visit
Admission: EM | Admit: 2023-01-18 | Discharge: 2023-01-18 | Disposition: A | Discharge status: Home / Self Care | Payer: Medicaid Other | Attending: Nurse Practitioner | Admitting: Nurse Practitioner

## 2023-01-18 ENCOUNTER — Encounter: Payer: Self-pay | Admitting: Emergency Medicine

## 2023-01-18 DIAGNOSIS — R300 Dysuria: Secondary | ICD-10-CM | POA: Diagnosis not present

## 2023-01-18 DIAGNOSIS — N6489 Other specified disorders of breast: Secondary | ICD-10-CM | POA: Insufficient documentation

## 2023-01-18 DIAGNOSIS — M7989 Other specified soft tissue disorders: Secondary | ICD-10-CM

## 2023-01-18 LAB — POCT URINALYSIS DIP (MANUAL ENTRY)
Bilirubin, UA: NEGATIVE
Glucose, UA: NEGATIVE mg/dL
Leukocytes, UA: NEGATIVE
Nitrite, UA: NEGATIVE
Spec Grav, UA: >=1.03 — AB (ref 1.010–1.025)
Urobilinogen, UA: 0.2 E.U./dL
pH, UA: 6 (ref 5.0–8.0)

## 2023-01-18 LAB — POCT URINE PREGNANCY: Preg Test, Ur: NEGATIVE

## 2023-01-18 MED ORDER — PHENAZOPYRIDINE HCL 200 MG PO TABS
200.0000 mg | ORAL_TABLET | Freq: Three times a day (TID) | ORAL | 0 refills | Status: DC
Start: 2023-01-18 — End: 2023-12-01

## 2023-01-18 NOTE — ED Provider Notes (Addendum)
[facility]    CSN: [id number] Arrival date & time: 01/18/23  1146      History   Chief Complaint Chief Complaint  Patient presents with   Breast Pain   Urinary Frequency    HPI Deanna Myers is a 32 y.o. female presents for dysuria.  Patient states she is she was started on Lasix couple weeks ago for some lower extremity swelling.  She is in the process of a workup and has a cardiology appointment this week as well as a nephrology appointment next month.  She states she has had some urinary frequency that seems to be more than normal since she started on Lasix.  Denies any burning but does report some cramping at the end of the urine stream.  No urinary odor, blood in the urine, fevers, nausea/vomiting, flank pain.  No vaginal discharge or STD concern.  She did stop taking her Lasix 2 days ago but will restart it today.  In addition she reports since starting Lasix her breast seem much more fuller than normal.  States it is painful when she wears her bra but denies any unilateral swelling or erythema to the breast.  She does do self breast exam and denies any lumps or bumps.  No nipple inversion or discharge.  No other concerns at this time.   Urinary Frequency    Past Medical History:  Diagnosis Date   ADHD (attention deficit hyperactivity disorder)    since childhood   Asthma    inhaler last used prior to pregnancy   BV (bacterial vaginosis)    Chlamydia    GERD (gastroesophageal reflux disease)    Gonorrhea    Hyperemesis    G3    Patient Active Problem List   Diagnosis Date Noted   Lower extremity edema 12/27/2022   Right foot pain 11/15/2022   Acute cough 10/22/2022   Pain, joint, pelvic region, left 08/13/2022   Left leg pain 06/30/2022   Healthcare maintenance 04/01/2022   Active labor at term 12/21/2015   UTI (urinary tract infection) 06/13/2015   Pyelonephritis affecting pregnancy in first trimester 06/12/2015   Folliculitis 02/13/2013     Past Surgical History:  Procedure Laterality Date   CESAREAN SECTION N/A 12/21/2015   Procedure: CESAREAN SECTION;  Surgeon: [name], MD;  Location: [facility];  Service: Obstetrics;  Laterality: N/A;   LAPAROSCOPIC APPENDECTOMY N/A 12/21/2021   Procedure: APPENDECTOMY LAPAROSCOPIC;  Surgeon: Fritzi Mandes, MD;  Location: [facility];  Service: General;  Laterality: N/A;    OB History     Gravida  4   Para  2   Term  2   Preterm      AB  1   Living  2      SAB  1   IAB      Ectopic      Multiple  0   Live Births  2            Home Medications    Prior to Admission medications   Medication Sig Start Date End Date Taking? Authorizing Provider  phenazopyridine (PYRIDIUM) 200 MG tablet Take 1 tablet (200 mg total) by mouth 3 (three) times daily. 01/18/23  Yes Radford Pax, NP  acetaminophen (TYLENOL) 500 MG tablet Take 1,000 mg by mouth every 6 (six) hours as needed for moderate pain or headache.    [provider]  benzonatate (TESSALON) 100 MG capsule Take 1 capsule (100 mg total) by mouth  2 (two) times daily as needed for cough. Patient not taking: Reported on 12/27/2022 10/22/22   Ivonne Andrew, NP  dicyclomine (BENTYL) 10 MG capsule Take 1 capsule (10 mg total) by mouth 4 (four) times daily as needed for spasms. 08/06/22   Imogene Burn, MD  furosemide (LASIX) 20 MG tablet Take 1 tablet (20 mg total) by mouth daily for 5 days. 12/27/22 01/01/23  Ivonne Andrew, NP  gabapentin (NEURONTIN) 100 MG capsule Take 1 capsule (100 mg total) by mouth 3 (three) times daily. Patient not taking: Reported on 12/27/2022 05/28/22   Ivonne Andrew, NP  ibuprofen (ADVIL) 200 MG tablet Take 400 mg by mouth every 6 (six) hours as needed for headache or moderate pain. Patient not taking: Reported on 12/27/2022    [provider]  levocetirizine (XYZAL ALLERGY 24HR) 5 MG tablet Take 1 tablet (5 mg total) by mouth every evening. 01/07/23 07/06/23  Ivonne Andrew, NP  linaclotide Karlene Einstein) 72 MCG capsule Take 1 capsule (72 mcg total) by mouth daily before breakfast. Patient not taking: Reported on 12/27/2022 10/06/22   Imogene Burn, MD  meloxicam (MOBIC) 15 MG tablet Take 1 tablet (15 mg total) by mouth daily. 12/20/22   Madelyn Brunner, DO  methocarbamol (ROBAXIN) 500 MG tablet Take 1 tablet (500 mg total) by mouth 2 (two) times daily as needed for muscle spasms. Patient not taking: Reported on 12/27/2022 04/07/22   Cristie Hem, PA-C  ondansetron (ZOFRAN) 4 MG tablet Take 1 tablet (4 mg total) by mouth every 8 (eight) hours as needed for nausea or vomiting. 09/08/22   [name], DO  Rimegepant Sulfate (NURTEC PO) Take by mouth.    [provider]  SUMAtriptan (IMITREX) 20 MG/ACT nasal spray Place 1 spray (20 mg total) into the nose as needed for migraine or headache. May repeat in 2 hours if headache persists or recurs.  Maximum doses in 24 hours. 11/12/22   Everlena Cooper, Adam R, DO  albuterol (PROVENTIL HFA;VENTOLIN HFA) 108 (90 BASE) MCG/ACT inhaler Inhale 1-2 puffs into the lungs every 6 (six) hours as needed for wheezing or shortness of breath.  05/21/20  [provider]  omeprazole (PRILOSEC) 20 MG capsule Take 1 capsule (20 mg total) by mouth 2 (two) times daily. Patient not taking: Reported on 02/10/2018 07/03/17 09/18/19  [name], MD  SUMAtriptan (IMITREX) 100 MG tablet Take 1 tablet earliest onset of migraine.  May repeat once in 2 hours if headache persists or recurs.  Do not exceed 2 tablets in 24 hours 02/10/18 05/21/20  [name], DO    Family History Family History  Problem Relation Age of Onset   Hypertension Father    Hypertension Mother    Kidney disease Mother    Cervical cancer Paternal Aunt    Congestive Heart Failure Maternal Grandmother    Breast cancer Paternal Grandfather     Social History Social History   Tobacco Use   Smoking status: Never   Smokeless tobacco: Never  Vaping Use   Vaping Use: Never  used  Substance Use Topics   Alcohol use: Yes    Comment: social   Drug use: No     Allergies   Shellfish allergy, Adhesive [tape], Latex, and Betadine [povidone iodine]   Review of Systems Review of Systems  Cardiovascular:  Positive for leg swelling.  Genitourinary:  Positive for dysuria and frequency.  Skin:        Bilateral breast feel  full     Physical Exam Triage Vital Signs ED Triage Vitals  Enc Vitals Group     BP 01/18/23 1241 112/79     Pulse Rate 01/18/23 1241 86     Resp 01/18/23 1241 18     Temp 01/18/23 1241 98.8 F (37.1 C)     Temp Source 01/18/23 1241 Oral     SpO2 01/18/23 1241 100 %     Weight --      Height --      Head Circumference --      Peak Flow --      Pain Score 01/18/23 1239 8     Pain Loc --      Pain Edu? --      Excl. in GC? --    No data found.  Updated Vital Signs BP 112/79 (BP Location: Left Arm)   Pulse 86   Temp 98.8 F (37.1 C) (Oral)   Resp 18   LMP 12/31/2022   SpO2 100%   Visual Acuity Right Eye Distance:   Left Eye Distance:   Bilateral Distance:    Right Eye Near:   Left Eye Near:    Bilateral Near:     Physical Exam Vitals and nursing note reviewed.  Constitutional:      General: She is not in acute distress.    Appearance: Normal appearance. She is not ill-appearing.  HENT:     Head: Normocephalic and atraumatic.  Eyes:     Pupils: Pupils are equal, round, and reactive to light.  Cardiovascular:     Rate and Rhythm: Normal rate.  Pulmonary:     Effort: Pulmonary effort is normal.  Chest:  Breasts:    Breasts are symmetrical.     Right: Tenderness present. No swelling, bleeding, inverted nipple, mass, nipple discharge or skin change.     Left: Tenderness present. No swelling, bleeding, inverted nipple, mass, nipple discharge or skin change.     Comments: No erythema of the breast.  Bilateral nipples are pierced. Musculoskeletal:     Comments: There is pitting edema bilaterally left greater  than right that extends to mid shin.  Skin:    General: Skin is warm and dry.  Neurological:     General: No focal deficit present.     Mental Status: She is alert and oriented to person, place, and time.  Psychiatric:        Mood and Affect: Mood normal.        Behavior: Behavior normal.      UC Treatments / Results  Labs (all labs ordered are listed, but only abnormal results are displayed) Labs Reviewed  POCT URINALYSIS DIP (MANUAL ENTRY) - Abnormal; Notable for the following components:      Result Value   Clarity, UA cloudy (*)    Ketones, POC UA trace (5) (*)    Spec Grav, UA >=1.030 (*)    Blood, UA trace-intact (*)    Protein Ur, POC trace (*)    All other components within normal limits  URINE CULTURE  POCT URINE PREGNANCY    EKG   Radiology No results found.  Procedures Procedures (including critical care time)  Medications Ordered in UC Medications - No data to display  Initial Impression / Assessment and Plan / UC Course  I have reviewed the triage vital signs and the nursing notes.  Pertinent labs & imaging results that were available during my care of the patient were reviewed by me and  considered in my medical decision making (see chart for details).     Reviewed exam and symptoms with patient.  No red flags UA negative for UTI.  hCG negative.  Will send for urine culture and contact if positive.  May use Azo as needed for symptoms until culture returns She will restart her Lasix today and follow-up with cardiology at her appointment on Friday Unclear cause of breast tenderness.  Advised patient to follow-up with her PCP this week for further workup if indicated ER precautions reviewed and patient verbalized understanding Final Clinical Impressions(s) / UC Diagnoses   Final diagnoses:  Dysuria  Left upper extremity swelling  Fullness of breast     Discharge Instructions      Clinic will contact you with results of the urine culture done  today if positive.  You may start Azo to help with your urinary symptoms.  This will make your urine orange Restart your Lasix today and follow-up with your cardiologist at your scheduled appointment on Friday Please follow-up with your PCP regarding your breast pain for further workup if indicated Please go to the ER if you develop any worsening symptoms I hope you feel better soon!    ED Prescriptions     Medication Sig Dispense Auth. Provider   phenazopyridine (PYRIDIUM) 200 MG tablet Take 1 tablet (200 mg total) by mouth 3 (three) times daily. 6 tablet Radford Pax, NP      PDMP not reviewed this encounter.   Radford Pax, NP 01/18/23 1315    Radford Pax, NP 01/18/23 1316

## 2023-01-18 NOTE — Discharge Instructions (Signed)
Clinic will contact you with results of the urine culture done today if positive.  You may start Azo to help with your urinary symptoms.  This will make your urine orange Restart your Lasix today and follow-up with your cardiologist at your scheduled appointment on Friday Please follow-up with your PCP regarding your breast pain for further workup if indicated Please go to the ER if you develop any worsening symptoms I hope you feel better soon!

## 2023-01-18 NOTE — ED Triage Notes (Signed)
Pt reports that she taking Lasix for swelling in her legs. Reports that urinating frequency been about a week. Reports hasn't taken Lasix in past two days but still urinating a lot. Would like her sugar level checked bc the internet states could be pre-diabetic.  Pt adds also having dysuria.  Also has breast swelling for two weeks. Unable to get in to see to see PCP. Last time had this she was pregnant and doesn't think is the case this time.

## 2023-01-19 DIAGNOSIS — F6381 Intermittent explosive disorder: Secondary | ICD-10-CM | POA: Diagnosis not present

## 2023-01-19 DIAGNOSIS — F902 Attention-deficit hyperactivity disorder, combined type: Secondary | ICD-10-CM | POA: Diagnosis not present

## 2023-01-20 NOTE — Progress Notes (Signed)
Cardiology Office Note:    Date:  01/21/2023   ID:  Deanna Myers, DOB 07/30/1991, MRN [id number]  PCP:  Ivonne Andrew, NP   [facility] Providers Cardiologist:  None     Referring MD: Ivonne Andrew, NP   Chief Complaint  Patient presents with   Leg Swelling    History of Present Illness:    Deanna Myers is a 32 y.o. female is seen at the request of Angus Seller NP for evaluation of LE edema. She reports that she developed left LE swelling after appendectomy a year ago. She was seen by Dr Karin Lieu at [facility] who did venous dopplers with no DVT and he felt her circulation was ok. More recently over the past month she has noted swelling in her right leg as well. States her toes look like sausages. Also notes breast size has increased. Denies any abdominal swelling. Was placed on lasix and notes increased urine output but no change in swelling. She is wearing compression hose. She denies any SOB, palpitations, chest pain.   Past Medical History:  Diagnosis Date   ADHD (attention deficit hyperactivity disorder)    since childhood   Asthma    inhaler last used prior to pregnancy   BV (bacterial vaginosis)    Chlamydia    GERD (gastroesophageal reflux disease)    Gonorrhea    Hyperemesis    G3    Past Surgical History:  Procedure Laterality Date   CESAREAN SECTION N/A 12/21/2015   Procedure: CESAREAN SECTION;  Surgeon: [name], MD;  Location: [facility];  Service: Obstetrics;  Laterality: N/A;   LAPAROSCOPIC APPENDECTOMY N/A 12/21/2021   Procedure: APPENDECTOMY LAPAROSCOPIC;  Surgeon: Fritzi Mandes, MD;  Location: [facility];  Service: General;  Laterality: N/A;    Current Medications: Current Meds  Medication Sig   acetaminophen (TYLENOL) 500 MG tablet Take 1,000 mg by mouth every 6 (six) hours as needed for moderate pain or headache.   dicyclomine (BENTYL) 10 MG capsule Take 1 capsule (10 mg total) by mouth 4 (four) times daily as needed for spasms.    furosemide (LASIX) 20 MG tablet Take 1 tablet (20 mg total) by mouth daily for 5 days.   gabapentin (NEURONTIN) 100 MG capsule Take 1 capsule (100 mg total) by mouth 3 (three) times daily.   ibuprofen (ADVIL) 200 MG tablet Take 400 mg by mouth every 6 (six) hours as needed for headache or moderate pain.   levocetirizine (XYZAL ALLERGY 24HR) 5 MG tablet Take 1 tablet (5 mg total) by mouth every evening.   linaclotide (LINZESS) 72 MCG capsule Take 1 capsule (72 mcg total) by mouth daily before breakfast.   meloxicam (MOBIC) 15 MG tablet Take 1 tablet (15 mg total) by mouth daily.   methocarbamol (ROBAXIN) 500 MG tablet Take 1 tablet (500 mg total) by mouth 2 (two) times daily as needed for muscle spasms.   ondansetron (ZOFRAN) 4 MG tablet Take 1 tablet (4 mg total) by mouth every 8 (eight) hours as needed for nausea or vomiting.   phenazopyridine (PYRIDIUM) 200 MG tablet Take 1 tablet (200 mg total) by mouth 3 (three) times daily.   Rimegepant Sulfate (NURTEC PO) Take by mouth.   SUMAtriptan (IMITREX) 20 MG/ACT nasal spray Place 1 spray (20 mg total) into the nose as needed for migraine or headache. May repeat in 2 hours if headache persists or recurs.  Maximum doses in 24 hours.   [DISCONTINUED] omeprazole (PRILOSEC) 20 MG  capsule Take 1 capsule (20 mg total) by mouth 2 (two) times daily.   [DISCONTINUED] SUMAtriptan (IMITREX) 100 MG tablet Take 1 tablet earliest onset of migraine.  May repeat once in 2 hours if headache persists or recurs.  Do not exceed 2 tablets in 24 hours     Allergies:   Shellfish allergy, Adhesive [tape], Latex, and Betadine [povidone iodine]   Social History   Socioeconomic History   Marital status: Single    Spouse name: Not on file   Number of children: 2   Years of education: Not on file   Highest education level: GED or equivalent  Occupational History   Occupation: Naval architect  Tobacco Use   Smoking status: Never   Smokeless tobacco: Never  Vaping Use    Vaping Use: Never used  Substance and Sexual Activity   Alcohol use: Yes    Comment: social   Drug use: No   Sexual activity: Yes    Partners: Male    Birth control/protection: None  Other Topics Concern   Not on file  Social History Narrative   Pt is right handed, lives with her 2 daughters in a 1 story story home. Rarely drinks caffeine. Is very active.      Phlebotomist      Social Determinants of Health   Financial Resource Strain: Medium Risk (12/27/2022)   Overall Financial Resource Strain (CARDIA)    Difficulty of Paying Living Expenses: Somewhat hard  Food Insecurity: Food Insecurity Present (12/27/2022)   Hunger Vital Sign    Worried About Running Out of Food in the Last Year: Never true    Ran Out of Food in the Last Year: Sometimes true  Transportation Needs: No Transportation Needs (12/27/2022)   PRAPARE - Administrator, Civil Service (Medical): No    Lack of Transportation (Non-Medical): No  Physical Activity: Sufficiently Active (12/27/2022)   Exercise Vital Sign    Days of Exercise per Week: 5 days    Minutes of Exercise per Session: 90 min  Stress: Stress Concern Present (12/27/2022)   [employer] of Occupational Health - Occupational Stress Questionnaire    Feeling of Stress : Rather much  Social Connections: Moderately Isolated (12/27/2022)   Social Connection and Isolation Panel [NHANES]    Frequency of Communication with Friends and Family: More than three times a week    Frequency of Social Gatherings with Friends and Family: More than three times a week    Attends Religious Services: More than 4 times per year    Active Member of [employer] or Organizations: No    Attends Engineer, structural: Not on file    Marital Status: Never married     Family History: The patient's family history includes Breast cancer in her paternal grandfather; Cervical cancer in her paternal aunt; Congestive Heart Failure in her maternal grandmother;  Hypertension in her father and mother; Kidney disease in her mother.  ROS:   Please see the history of present illness.     All other systems reviewed and are negative.  EKGs/Labs/Other Studies Reviewed:    The following studies were reviewed today:  LE venous duplex 12/27/22: BILATERAL:  - No evidence of deep vein thrombosis seen in the lower extremities,  bilaterally.  -No evidence of popliteal cyst, bilaterally.      EKG:  EKG is  ordered today.  The ekg ordered today demonstrates NSR with incomplete RBBB. Normal. I have personally reviewed and interpreted this study.  Recent Labs: 08/06/2022: TSH 0.76 12/27/2022: ALT 12; BUN 7; Creatinine, Ser 0.67; Hemoglobin 11.6; Platelets 371; Potassium 4.4; Sodium 141  Recent Lipid Panel No results found for: "CHOL", "TRIG", "HDL", "CHOLHDL", "VLDL", "LDLCALC", "LDLDIRECT"   Risk Assessment/Calculations:                Physical Exam:    VS:  BP 122/72 (BP Location: Left Arm, Patient Position: Sitting, Cuff Size: Normal)   Pulse 85   Ht  (1.676 m)   Wt 249 lb 6.4 oz (113.1 kg)   LMP 12/31/2022   SpO2 99%   BMI 40.25 kg/m     Wt Readings from Last 3 Encounters:  01/21/23 249 lb 6.4 oz (113.1 kg)  12/27/22 251 lb 9.6 oz (114.1 kg)  12/17/22 241 lb 12.8 oz (109.7 kg)     GEN:  Well nourished, well developed in no acute distress HEENT: Normal NECK: No JVD; No carotid bruits LYMPHATICS: No lymphadenopathy Breasts large. CARDIAC: RRR, no murmurs, rubs, gallops RESPIRATORY:  Clear to auscultation without rales, wheezing or rhonchi  ABDOMEN: Soft, non-tender, non-distended MUSCULOSKELETAL:  No pitting edema; compression hose in place.  SKIN: Warm and dry NEUROLOGIC:  Alert and oriented x 3 PSYCHIATRIC:  Normal affect   ASSESSMENT:    1. Lower extremity edema    PLAN:    In order of problems listed above:  LE swelling. No pitting edema on exam today. No significant evidence of renal disease, proteinuria, liver  disease. CT abd negative in November. Prior LE venous dopplers negative last year. I doubt cardiac etiology but will obtain Echo to assess cardiac function.            Medication Adjustments/Labs and Tests Ordered: Current medicines are reviewed at length with the patient today.  Concerns regarding medicines are outlined above.  Orders Placed This Encounter  Procedures   EKG 12-Lead   ECHOCARDIOGRAM COMPLETE   No orders of the defined types were placed in this encounter.   There are no Patient Instructions on file for this visit.   Signed, Fender Herder [place], MD  01/21/2023 4:42 PM    [facility]

## 2023-01-21 ENCOUNTER — Encounter: Payer: Self-pay | Admitting: Cardiology

## 2023-01-21 ENCOUNTER — Ambulatory Visit: Payer: Medicaid Other | Attending: Cardiology | Admitting: Cardiology

## 2023-01-21 ENCOUNTER — Ambulatory Visit (INDEPENDENT_AMBULATORY_CARE_PROVIDER_SITE_OTHER): Payer: Medicaid Other | Admitting: Clinical

## 2023-01-21 VITALS — BP 122/72 | HR 85 | Ht 66.0 in | Wt 249.4 lb

## 2023-01-21 DIAGNOSIS — F419 Anxiety disorder, unspecified: Secondary | ICD-10-CM | POA: Diagnosis not present

## 2023-01-21 DIAGNOSIS — F32A Depression, unspecified: Secondary | ICD-10-CM

## 2023-01-21 DIAGNOSIS — R600 Localized edema: Secondary | ICD-10-CM

## 2023-01-21 NOTE — Patient Instructions (Signed)
Medication Instructions:  Continue same medications   Lab Work: None ordered   Testing/Procedures: Echo   Follow-Up: At [facility], you and your health needs are our priority.  As part of our continuing mission to provide you with exceptional heart care, we have created designated Provider Care Teams.  These Care Teams include your primary Cardiologist (physician) and Advanced Practice Providers (APPs -  Physician Assistants and Nurse Practitioners) who all work together to provide you with the care you need, when you need it.  We recommend signing up for the patient portal called "MyChart".  Sign up information is provided on this After Visit Summary.  MyChart is used to connect with patients for Virtual Visits (Telemedicine).  Patients are able to view lab/test results, encounter notes, upcoming appointments, etc.  Non-urgent messages can be sent to your provider as well.   To learn more about what you can do with MyChart, go to [email or web address].    Your next appointment:  To Be Determined after Test    Provider: Dr.Jordan
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
## 2023-01-22 LAB — URINE CULTURE: Culture: 30000 — AB

## 2023-01-24 ENCOUNTER — Telehealth (HOSPITAL_COMMUNITY): Payer: Self-pay | Admitting: Emergency Medicine

## 2023-01-24 MED ORDER — NITROFURANTOIN MONOHYD MACRO 100 MG PO CAPS: 100.0000 mg | ORAL_CAPSULE | Freq: Two times a day (BID) | ORAL | 0 refills | Status: AC

## 2023-01-27 ENCOUNTER — Ambulatory Visit: Payer: Medicaid Other | Admitting: Physical Therapy

## 2023-01-27 DIAGNOSIS — I890 Lymphedema, not elsewhere classified: Secondary | ICD-10-CM | POA: Diagnosis not present

## 2023-02-01 ENCOUNTER — Ambulatory Visit (INDEPENDENT_AMBULATORY_CARE_PROVIDER_SITE_OTHER): Payer: Medicaid Other | Admitting: Clinical

## 2023-02-01 DIAGNOSIS — F419 Anxiety disorder, unspecified: Secondary | ICD-10-CM

## 2023-02-01 DIAGNOSIS — F32A Depression, unspecified: Secondary | ICD-10-CM

## 2023-02-01 NOTE — BH Specialist Note (Unsigned)
Integrated Behavioral Health Follow Up In-Person Visit  MRN: [id number] Name: Deanna Myers  Number of Integrated Behavioral Health Clinician visits: Additional Visit  Session Start time: 1505   Session End time: 1600  Total time in minutes: 55   Types of Service: Individual psychotherapy  Interpretor:No. Interpretor Name and Language: none  Subjective: Deanna Myers is a 32 y.o. female  Patient was referred by Angus Seller, NP for anxiety and depression. Patient and/or family reports the following symptoms/concerns: anxiety and possible depression, history of trauma  Duration of problem: several years; Severity of problem: moderate  Objective: Mood: Euthymic and Affect: Appropriate Risk of harm to self or others: No plan to harm self or others  Patient and/or Family's Strengths/Protective Factors: Social connections, Social and Emotional competence, Concrete supports in place (healthy food, safe environments, etc.), and Sense of purpose   Goals Addressed: Patient will:  Reduce symptoms of: anxiety and depression   Increase knowledge and/or ability of: coping skills and self-management skills   Demonstrate ability to: Increase healthy adjustment to current life circumstances  Progress towards Goals: Ongoing  Interventions: Interventions utilized:  CBT Cognitive Behavioral Therapy and Supportive Counseling Standardized Assessments completed: Not Needed  Supportive counseling as patient navigates schedule with her new job. She has to start in the early morning hours. Patient also navigating dynamics with family, particularly her brother. CBT today to explore patient's emotions around this.   Patient and/or Family Response: Patient engaged in session.   Assessment: Patient currently experiencing depression and anxiety related to multiple life stressors. She also reports history of trauma that she was working on with a previous therapist. She experiences  challenges in managing feelings of anger.   Patient may benefit from CBT to address thoughts related to current anxiety experience and to continue processing history of trauma that may contribute to depression and anxiety.  Plan: Follow up with behavioral health clinician on: 02/14/23  Abigail Butts, LCSW

## 2023-02-03 ENCOUNTER — Encounter: Payer: Medicaid Other | Admitting: Physical Therapy

## 2023-02-10 ENCOUNTER — Ambulatory Visit: Payer: Medicaid Other | Admitting: Nurse Practitioner

## 2023-02-11 ENCOUNTER — Encounter: Payer: Medicaid Other | Admitting: Physical Therapy

## 2023-02-12 ENCOUNTER — Ambulatory Visit
Admission: EM | Admit: 2023-02-12 | Discharge: 2023-02-12 | Disposition: A | Discharge status: Home / Self Care | Payer: Medicaid Other | Attending: Urgent Care | Admitting: Urgent Care

## 2023-02-12 DIAGNOSIS — R102 Pelvic and perineal pain: Secondary | ICD-10-CM

## 2023-02-12 DIAGNOSIS — N898 Other specified noninflammatory disorders of vagina: Secondary | ICD-10-CM

## 2023-02-12 LAB — POCT URINALYSIS DIP (MANUAL ENTRY)
Bilirubin, UA: NEGATIVE
Glucose, UA: NEGATIVE mg/dL
Ketones, POC UA: NEGATIVE mg/dL
Leukocytes, UA: NEGATIVE
Nitrite, UA: NEGATIVE
Protein Ur, POC: NEGATIVE mg/dL
Spec Grav, UA: 1.025 (ref 1.010–1.025)
Urobilinogen, UA: 0.2 E.U./dL
pH, UA: 5.5 (ref 5.0–8.0)

## 2023-02-12 LAB — POCT URINE PREGNANCY: Preg Test, Ur: NEGATIVE

## 2023-02-12 MED ORDER — METHOCARBAMOL 500 MG PO TABS: 500.0000 mg | ORAL_TABLET | Freq: Four times a day (QID) | ORAL | 2 refills | Status: DC | PRN

## 2023-02-12 MED ORDER — IBUPROFEN 600 MG PO TABS: 600.0000 mg | ORAL_TABLET | Freq: Four times a day (QID) | ORAL | 0 refills | Status: DC | PRN

## 2023-02-12 NOTE — ED Triage Notes (Signed)
Pt states that she felt like she was having contractions Thursday night. She took a muscle relaxer and ibu.

## 2023-02-12 NOTE — Discharge Instructions (Addendum)
Your urine sample is normal. We will only call with results of your vaginal swab if any thing is positive. Please take the Advil and methocarbamol every 6 hours as needed.  Keep in mind that the methocarbamol is slightly sedating. Do not take your meloxicam on the days you are taking Advil.  You must take this medication with food. Please follow up with gynecology for further eval if your symptoms persist or continue.

## 2023-02-12 NOTE — ED Provider Notes (Signed)
[name]    CSN: [id number] Arrival date & time: 02/12/23  0820      History   Chief Complaint Chief Complaint  Patient presents with   pelvic pian    HPI Deanna Myers is a 32 y.o. female.   Pleasant 32 year old female presents today due to concerns of pelvic pain.  She states it started Thursday evening after having intercourse.  She reports a history of this in the past, used to follow with a pelvic floor therapist.  She states she was in 11 out of 10 pain on Thursday evening, took 2 extra strength Tylenol's in addition to 2 muscle relaxers.  She states this "knocked her out" for 9 hours, and woke up in the last pain.  She states she still having intermittent discomfort.  She reports the pain feels like intermittent contractions, however denies pregnancy.  She denies any urinary symptoms or vaginal discharge.     Past Medical History:  Diagnosis Date   ADHD (attention deficit hyperactivity disorder)    since childhood   Asthma    inhaler last used prior to pregnancy   BV (bacterial vaginosis)    Chlamydia    GERD (gastroesophageal reflux disease)    Gonorrhea    Hyperemesis    G3    Patient Active Problem List   Diagnosis Date Noted   Lower extremity edema 12/27/2022   Right foot pain 11/15/2022   Acute cough 10/22/2022   Pain, joint, pelvic region, left 08/13/2022   Left leg pain 06/30/2022   Healthcare maintenance 04/01/2022   Active labor at term 12/21/2015   UTI (urinary tract infection) 06/13/2015   Pyelonephritis affecting pregnancy in first trimester 06/12/2015   Folliculitis 02/13/2013    Past Surgical History:  Procedure Laterality Date   CESAREAN SECTION N/A 12/21/2015   Procedure: CESAREAN SECTION;  Surgeon: [name], MD;  Location: [facility];  Service: Obstetrics;  Laterality: N/A;   LAPAROSCOPIC APPENDECTOMY N/A 12/21/2021   Procedure: APPENDECTOMY LAPAROSCOPIC;  Surgeon: Fritzi Mandes, MD;  Location: [facility];  Service:  General;  Laterality: N/A;    OB History     Gravida  4   Para  2   Term  2   Preterm      AB  1   Living  2      SAB  1   IAB      Ectopic      Multiple  0   Live Births  2            Home Medications    Prior to Admission medications   Medication Sig Start Date End Date Taking? Authorizing Provider  acetaminophen (TYLENOL) 500 MG tablet Take 1,000 mg by mouth every 6 (six) hours as needed for moderate pain or headache.   Yes [provider]  benzonatate (TESSALON) 100 MG capsule Take 1 capsule (100 mg total) by mouth 2 (two) times daily as needed for cough. 10/22/22  Yes Ivonne Andrew, NP  dicyclomine (BENTYL) 10 MG capsule Take 1 capsule (10 mg total) by mouth 4 (four) times daily as needed for spasms. 08/06/22  Yes Imogene Burn, MD  gabapentin (NEURONTIN) 100 MG capsule Take 1 capsule (100 mg total) by mouth 3 (three) times daily. 05/28/22  Yes Ivonne Andrew, NP  levocetirizine (XYZAL ALLERGY 24HR) 5 MG tablet Take 1 tablet (5 mg total) by mouth every evening. 01/07/23 07/06/23 Yes Ivonne Andrew, NP  linaclotide Karlene Einstein) 72  MCG capsule Take 1 capsule (72 mcg total) by mouth daily before breakfast. 10/06/22  Yes Imogene Burn, MD  meloxicam (MOBIC) 15 MG tablet Take 1 tablet (15 mg total) by mouth daily. 12/20/22  Yes Madelyn Brunner, DO  ondansetron (ZOFRAN) 4 MG tablet Take 1 tablet (4 mg total) by mouth every 8 (eight) hours as needed for nausea or vomiting. 09/08/22  Yes Jaffe, Adam R, DO  phenazopyridine (PYRIDIUM) 200 MG tablet Take 1 tablet (200 mg total) by mouth 3 (three) times daily. 01/18/23  Yes Radford Pax, NP  Rimegepant Sulfate (NURTEC PO) Take by mouth.   Yes [provider]  SUMAtriptan (IMITREX) 20 MG/ACT nasal spray Place 1 spray (20 mg total) into the nose as needed for migraine or headache. May repeat in 2 hours if headache persists or recurs.  Maximum doses in 24 hours. 11/12/22  Yes Jaffe, Adam R, DO  furosemide (LASIX) 20  MG tablet Take 1 tablet (20 mg total) by mouth daily for 5 days. 12/27/22 01/21/23  Ivonne Andrew, NP  ibuprofen (ADVIL) 600 MG tablet Take 1 tablet (600 mg total) by mouth every 6 (six) hours as needed for moderate pain, fever or cramping. Take with food 02/12/23   Takeshi Teasdale L, PA  methocarbamol (ROBAXIN) 500 MG tablet Take 1 tablet (500 mg total) by mouth every 6 (six) hours as needed for muscle spasms (pelvic cramps). 02/12/23   Abshir Paolini L, PA  albuterol (PROVENTIL HFA;VENTOLIN HFA) 108 (90 BASE) MCG/ACT inhaler Inhale 1-2 puffs into the lungs every 6 (six) hours as needed for wheezing or shortness of breath.  05/21/20  [provider]  omeprazole (PRILOSEC) 20 MG capsule Take 1 capsule (20 mg total) by mouth 2 (two) times daily. 07/03/17 09/18/19  [name], MD  SUMAtriptan (IMITREX) 100 MG tablet Take 1 tablet earliest onset of migraine.  May repeat once in 2 hours if headache persists or recurs.  Do not exceed 2 tablets in 24 hours 02/10/18 05/21/20  [name], DO    Family History Family History  Problem Relation Age of Onset   Hypertension Father    Hypertension Mother    Kidney disease Mother    Cervical cancer Paternal Aunt    Congestive Heart Failure Maternal Grandmother    Breast cancer Paternal Grandfather     Social History Social History   Tobacco Use   Smoking status: Never   Smokeless tobacco: Never  Vaping Use   Vaping Use: Never used  Substance Use Topics   Alcohol use: Yes    Comment: social   Drug use: No     Allergies   Shellfish allergy, Adhesive [tape], Latex, and Betadine [povidone iodine]   Review of Systems Review of Systems As per HPI  Physical Exam Triage Vital Signs ED Triage Vitals  Enc Vitals Group     BP 02/12/23 0833 124/77     Pulse Rate 02/12/23 0833 81     Resp 02/12/23 0833 20     Temp 02/12/23 0833 98.1 F (36.7 C)     Temp Source 02/12/23 0833 Oral     SpO2 02/12/23 0833 95 %     Weight 02/12/23 0831 248  lb (112.5 kg)     Height --      Head Circumference --      Peak Flow --      Pain Score 02/12/23 0831 7     Pain Loc --      Pain  Edu? --      Excl. in GC? --    No data found.  Updated Vital Signs BP 124/77 (BP Location: Left Arm)   Pulse 81   Temp 98.1 F (36.7 C) (Oral)   Resp 20   Wt 248 lb (112.5 kg)   LMP 01/26/2023 (Exact Date)   SpO2 95%   BMI 40.03 kg/m   Visual Acuity Right Eye Distance:   Left Eye Distance:   Bilateral Distance:    Right Eye Near:   Left Eye Near:    Bilateral Near:     Physical Exam Vitals and nursing note reviewed. Chaperone present: deferred.  Constitutional:      General: She is not in acute distress.    Appearance: Normal appearance. She is well-developed. She is obese. She is not ill-appearing, toxic-appearing or diaphoretic.  HENT:     Head: Normocephalic and atraumatic.  Cardiovascular:     Rate and Rhythm: Normal rate.     Heart sounds: No murmur heard. Pulmonary:     Effort: Pulmonary effort is normal. No respiratory distress.     Breath sounds: No wheezing.  Abdominal:     General: Abdomen is flat. Bowel sounds are normal. There is no distension. There are no signs of injury.     Palpations: Abdomen is soft. There is no shifting dullness, fluid wave, hepatomegaly, splenomegaly, mass or pulsatile mass.     Tenderness: There is abdominal tenderness in the suprapubic area. There is no right CVA tenderness, left CVA tenderness, guarding or rebound. Negative signs include Murphy's sign, Rovsing's sign, McBurney's sign, psoas sign and obturator sign.     Hernia: No hernia is present.  Genitourinary:    General: Normal vulva.     Pubic Area: No rash or pubic lice.      Labia:        Right: No rash, tenderness, lesion or injury.        Left: No rash, tenderness, lesion or injury.      Urethra: No prolapse, urethral pain, urethral swelling or urethral lesion.     Vagina: No signs of injury and foreign body. Vaginal discharge  (white) present. No erythema, tenderness, bleeding, lesions or prolapsed vaginal walls.     Cervix: No cervical motion tenderness, discharge, friability, lesion, erythema or eversion.     Uterus: Normal. Not deviated, not enlarged, not fixed, not tender (uncomfortable/ cramping) and no uterine prolapse.      Adnexa:        Right: Tenderness present. No mass or fullness.         Left: Tenderness present. No mass or fullness.       Rectum: Normal.     Comments: Cramping tenderness No cervical motion tenderness Lymphadenopathy:     Lower Body: No right inguinal adenopathy. No left inguinal adenopathy.  Neurological:     Mental Status: She is alert.      UC Treatments / Results  Labs (all labs ordered are listed, but only abnormal results are displayed) Labs Reviewed  POCT URINALYSIS DIP (MANUAL ENTRY) - Abnormal; Notable for the following components:      Result Value   Blood, UA trace-intact (*)    All other components within normal limits  POCT URINE PREGNANCY  CERVICOVAGINAL ANCILLARY ONLY    EKG   Radiology No results found.  Procedures Procedures (including critical care time)  Medications Ordered in UC Medications - No data to display  Initial Impression / Assessment and Plan /  UC Course  I have reviewed the triage vital signs and the nursing notes.  Pertinent labs & imaging results that were available during my care of the patient were reviewed by me and considered in my medical decision making (see chart for details).     Pelvic cramping - pt with hx of the same. Does well with NSAIDs and muscle relaxers. Is requesting a refill of this. Will f/u with pelvic floor specialists as this has helped in the remote past. UA and pregnancy test negative. Vaginal discharge - do not suspect PID given presentation, no cervical motion tenderness. Aptima swab collected, will start abx only if positive.   Final Clinical Impressions(s) / UC Diagnoses   Final diagnoses:   Pelvic cramping  Vaginal discharge     Discharge Instructions      Your urine sample is normal. We will only call with results of your vaginal swab if any thing is positive. Please take the Advil and methocarbamol every 6 hours as needed.  Keep in mind that the methocarbamol is slightly sedating. Do not take your meloxicam on the days you are taking Advil.  You must take this medication with food. Please follow up with gynecology for further eval if your symptoms persist or continue.     ED Prescriptions     Medication Sig Dispense Auth. Provider   methocarbamol (ROBAXIN) 500 MG tablet Take 1 tablet (500 mg total) by mouth every 6 (six) hours as needed for muscle spasms (pelvic cramps). 20 tablet Edison Nicholson L, PA   ibuprofen (ADVIL) 600 MG tablet Take 1 tablet (600 mg total) by mouth every 6 (six) hours as needed for moderate pain, fever or cramping. Take with food 30 tablet Sharvi Mooneyhan L, PA      PDMP not reviewed this encounter.   Maretta Bees, [place] 02/12/23 1004

## 2023-02-14 ENCOUNTER — Telehealth: Payer: Self-pay | Admitting: Clinical

## 2023-02-14 ENCOUNTER — Ambulatory Visit: Payer: Medicaid Other | Admitting: Clinical

## 2023-02-14 LAB — CERVICOVAGINAL ANCILLARY ONLY
Bacterial Vaginitis (gardnerella): POSITIVE — AB
Candida Glabrata: NEGATIVE
Candida Vaginitis: NEGATIVE
Chlamydia: NEGATIVE
Comment: NEGATIVE
Comment: NEGATIVE
Comment: NEGATIVE
Comment: NEGATIVE
Comment: NEGATIVE
Comment: NORMAL
Neisseria Gonorrhea: NEGATIVE
Trichomonas: NEGATIVE

## 2023-02-14 NOTE — Telephone Encounter (Signed)
Integrated Behavioral Health Progress Note  02/14/2023 Name: Deanna Myers MRN: [id number] DOB: 02-11-1991 Deanna Myers is a 32 y.o. year old female who sees Ivonne Andrew, NP for primary care. LCSW was initially consulted to assist the patient with mental health concerns.  Interpreter: No.   Interpreter Name & Language: none  Assessment: Patient experiencing mental health concerns; anxiety and depression.  Ongoing Intervention: Patient did not arrive for scheduled IBH appointment so CSW checked in with patient over the phone. Patient has a pretty busy schedule now with her new job and hasn't been able to make time for some other things. She is adjusting to the new schedule and also balancing single parenting and helping her mother who lives with her.   Some brief supportive counseling provided around these changes and adjustments. Also discussed prioritizing doctor's and counseling appointments and what patient feels she needs to do around this. Patient plans to discuss with her manager at work.   Deanna Butts, LCSW [facility] [phone]

## 2023-02-15 ENCOUNTER — Telehealth (HOSPITAL_COMMUNITY): Payer: Self-pay | Admitting: Emergency Medicine

## 2023-02-15 MED ORDER — METRONIDAZOLE 0.75 % VA GEL: 1.0000 | Freq: Every day | VAGINAL | 0 refills | Status: DC

## 2023-02-16 ENCOUNTER — Other Ambulatory Visit: Payer: Self-pay | Admitting: Nurse Practitioner

## 2023-02-16 ENCOUNTER — Encounter: Payer: Medicaid Other | Admitting: Physical Therapy

## 2023-02-16 DIAGNOSIS — R600 Localized edema: Secondary | ICD-10-CM | POA: Insufficient documentation

## 2023-02-16 DIAGNOSIS — J302 Other seasonal allergic rhinitis: Secondary | ICD-10-CM

## 2023-02-16 MED ORDER — LEVOCETIRIZINE DIHYDROCHLORIDE 5 MG PO TABS
5.0000 mg | ORAL_TABLET | Freq: Every evening | ORAL | 1 refills | Status: DC
Start: 2023-02-16 — End: 2023-12-01

## 2023-02-16 MED ORDER — METHOCARBAMOL 500 MG PO TABS: 500.0000 mg | ORAL_TABLET | Freq: Four times a day (QID) | ORAL | 2 refills | Status: DC | PRN

## 2023-02-22 ENCOUNTER — Encounter: Payer: Medicaid Other | Admitting: Physical Therapy

## 2023-02-24 ENCOUNTER — Encounter: Payer: Medicaid Other | Admitting: Physical Therapy

## 2023-02-26 DIAGNOSIS — I890 Lymphedema, not elsewhere classified: Secondary | ICD-10-CM | POA: Diagnosis not present

## 2023-03-01 ENCOUNTER — Ambulatory Visit (HOSPITAL_COMMUNITY): Payer: Medicaid Other | Attending: Cardiology

## 2023-03-01 DIAGNOSIS — R600 Localized edema: Secondary | ICD-10-CM | POA: Diagnosis not present

## 2023-03-01 LAB — ECHOCARDIOGRAM COMPLETE
Area-P 1/2: 3.16 cm2
S' Lateral: 3.6 cm

## 2023-03-02 ENCOUNTER — Encounter: Payer: Medicaid Other | Admitting: Physical Therapy

## 2023-03-03 ENCOUNTER — Ambulatory Visit (INDEPENDENT_AMBULATORY_CARE_PROVIDER_SITE_OTHER): Payer: Medicaid Other | Admitting: Clinical

## 2023-03-03 DIAGNOSIS — F32A Depression, unspecified: Secondary | ICD-10-CM | POA: Diagnosis not present

## 2023-03-03 DIAGNOSIS — F419 Anxiety disorder, unspecified: Secondary | ICD-10-CM | POA: Diagnosis not present
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
## 2023-03-04 ENCOUNTER — Encounter: Payer: Medicaid Other | Admitting: Physical Therapy

## 2023-03-09 NOTE — Progress Notes (Deleted)
NEUROLOGY FOLLOW UP OFFICE NOTE  Deanna Myers [id number]  Assessment/Plan:   Migraine with aura, without status migrainosus, intractable   Migraine prevention:  *** Migraine rescue:  ***.  For nausea, Zofran 4mg  Limit use of pain relievers to no more than 2 days out of week to prevent risk of rebound or medication-overuse headache. Keep headache diary Follow up ***       Subjective:  Deanna Myers is a 32 year old female with ADHD and asthma who follows up for migraines.  UPDATE: Tried Nurtec.  ***  Intensity:  severe Duration:  6 hours with rizatriptan Frequency:  4 days a month Current NSAIDS:  meloxicam, ibuprofen Current analgesics:  no Current triptans: none Current ergotamine:  no Current anti-emetic:  ondansetron 4mg  Current muscle relaxants:  methocarbamol Current anti-anxiolytic:  no Current sleep aide:  no Current Antihypertensive medications:  no Current Antidepressant medications:  no Current Anticonvulsant medications:  gabapentin 100mg  TID Current anti-CGRP:  no Current Vitamins/Herbal/Supplements:  no Current Antihistamines/Decongestants:  hydroxyzine Other therapy:  cool compresses, baths with Epson salts Other medication:  no  Caffeine:  Occasional soda or coffee Alcohol:  occasional Smoker:  no Diet:  Hydrates, Gatorade Exercise:  yes Depression:  no; Anxiety:  yes Other pain:  no Sleep hygiene:  good  HISTORY:  Feels headaches getting worse. Onset:  2015, following her first pregnancy.  Infrequent until this past year. Location:  Varies (frontal, parietal, occipital) Quality:  Undulating, pulsating, stabbing Initial intensity:  severe.  She denies new headache, thunderclap headache or severe headache that wakes her from sleep. Aura:  Monocular vision loss/blurred vision in either eye Prodrome: no Postdrome:  no Associated symptoms:  Nausea, photophobia, phonophobia, sometimes vomiting, hypersensitivity, head/neck/upper torso  aching  She denies associated unilateral numbness or weakness. Initial Duration:  4 days (3 days gradual buildup) Initial Frequency:  1 times a month Initial Frequency of abortive medication: as needed Triggers/aggravating factors:  Unknown Relieving factors:  Laying down in dark Activity:  aggravates   CT head on 04/25/2012 was normal.  Past NSAIDS:  Ibuprofen, naproxen Past analgesics:  Excedrin, Tylenol, Extra-strength Tylenol Past abortive triptans:  sumatriptan 100mg , rizatriptan Past muscle relaxants:  Robaxin, tizanidine Past anti-emetic:  Zofran 4mg  Past antihypertensive medications:  no Past antidepressant medications:  no Past anticonvulsant medications:  topiramate Past vitamins/Herbal/Supplements:  no Past antihistamines/decongestants:  no Other past therapies:  no      Family history of headache:  Mother had migraines  PAST MEDICAL HISTORY: Past Medical History:  Diagnosis Date   ADHD (attention deficit hyperactivity disorder)    since childhood   Asthma    inhaler last used prior to pregnancy   BV (bacterial vaginosis)    Chlamydia    GERD (gastroesophageal reflux disease)    Gonorrhea    Hyperemesis    G3    MEDICATIONS: Current Outpatient Medications on File Prior to Visit  Medication Sig Dispense Refill   acetaminophen (TYLENOL) 500 MG tablet Take 1,000 mg by mouth every 6 (six) hours as needed for moderate pain or headache.     dicyclomine (BENTYL) 10 MG capsule Take 1 capsule (10 mg total) by mouth 4 (four) times daily as needed for spasms. 120 capsule 2   furosemide (LASIX) 40 MG tablet Take 1 tablet (40 mg total) by mouth daily. 5 tablet 0   gabapentin (NEURONTIN) 100 MG capsule Take 1 capsule (100 mg total) by mouth 3 (three) times daily. 90 capsule 3   ibuprofen (ADVIL)  200 MG tablet Take 400 mg by mouth every 6 (six) hours as needed for headache or moderate pain.     levocetirizine (XYZAL ALLERGY 24HR) 5 MG tablet Take 1 tablet (5 mg total) by  mouth every evening. 30 tablet 0   meloxicam (MOBIC) 15 MG tablet Take 1 tablet (15 mg total) by mouth daily. 30 tablet 1   methocarbamol (ROBAXIN) 500 MG tablet Take 1 tablet (500 mg total) by mouth 2 (two) times daily as needed for muscle spasms. 20 tablet 2   naproxen (NAPROSYN) 500 MG tablet Take 1 tablet (500 mg total) by mouth 2 (two) times daily. 30 tablet 0   rizatriptan (MAXALT) 10 MG tablet Take 1 tablet earliest onset of headache.  May repeat in 2 hours if needed.  Maximum 2 tablets in 24 hours. 10 tablet 5   [DISCONTINUED] albuterol (PROVENTIL HFA;VENTOLIN HFA) 108 (90 BASE) MCG/ACT inhaler Inhale 1-2 puffs into the lungs every 6 (six) hours as needed for wheezing or shortness of breath.     [DISCONTINUED] omeprazole (PRILOSEC) 20 MG capsule Take 1 capsule (20 mg total) by mouth 2 (two) times daily. (Patient not taking: Reported on 02/10/2018) 60 capsule 1   [DISCONTINUED] SUMAtriptan (IMITREX) 100 MG tablet Take 1 tablet earliest onset of migraine.  May repeat once in 2 hours if headache persists or recurs.  Do not exceed 2 tablets in 24 hours 10 tablet 2   No current facility-administered medications on file prior to visit.     ALLERGIES: Allergies  Allergen Reactions   Shellfish Allergy Anaphylaxis   Adhesive [Tape] Swelling and Other (See Comments)    Reaction:  Bruising    Latex Hives   Betadine [Povidone Iodine] Rash    FAMILY HISTORY: Family History  Problem Relation Age of Onset   Hypertension Father    Hypertension Mother    Kidney disease Mother    Cervical cancer Paternal Aunt    Congestive Heart Failure Maternal Grandmother    Breast cancer Paternal Grandfather       Objective:  *** General: No acute distress.  Patient appears well-groomed.   Head:  Normocephalic/atraumatic Neck:  Supple.  No paraspinal tenderness.  Full range of motion. Heart:  Regular rate and rhythm. Neuro:  Alert and oriented.  Speech fluent and not dysarthric.  Language intact.  CN  II-XII intact.  Bulk and tone normal.  Muscle strength 5/5 throughout.  Deep tendon reflexes 2+ throughout.  Gait normal.  Romberg negative.    Shon Millet, DO  CC: Angus Seller, NP

## 2023-03-11 ENCOUNTER — Ambulatory Visit: Payer: Medicaid Other | Admitting: Neurology

## 2023-03-17 ENCOUNTER — Ambulatory Visit: Payer: Medicaid Other | Admitting: Clinical

## 2023-03-17 DIAGNOSIS — F32A Depression, unspecified: Secondary | ICD-10-CM | POA: Diagnosis not present

## 2023-03-17 DIAGNOSIS — F419 Anxiety disorder, unspecified: Secondary | ICD-10-CM
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
## 2023-03-29 ENCOUNTER — Ambulatory Visit: Payer: Medicaid Other | Admitting: Clinical

## 2023-03-31 ENCOUNTER — Ambulatory Visit: Payer: Self-pay | Admitting: Clinical

## 2023-04-14 ENCOUNTER — Telehealth: Payer: Self-pay | Admitting: Clinical

## 2023-04-14 ENCOUNTER — Ambulatory Visit: Payer: Medicaid Other | Admitting: Clinical

## 2023-04-14 NOTE — Telephone Encounter (Addendum)
Integrated Behavioral Health Progress Note  04/14/2023 Name: Deanna Myers MRN: [id number] DOB: 03-12-1991 [name] is a 32 y.o. year old female who sees Ivonne Andrew, NP for primary care. LCSW was initially consulted to assist the patient with mental health concerns.  Interpreter: No.   Interpreter Name & Language: none  Assessment: Patient experiencing mental health concerns; anxiety and depression. CSW follows for Integrated Behavioral Health (IBH) counseling.   Ongoing Intervention: Patient unable to make it in to the office today for [facility] appointment. However, she is experiencing increased stress, so checked in with patient over the phone. Patient is trying to take in her nieces and nephews as their home situation is unhealthy. Emotional validation and reflective listening provided as patient processed frustrations with the children's situation and the process of trying to provide care for them. Patient does work full time, but is in a financially tight spot. Referred patient to [facility] [employer] [facility] food [facility], as she has been unable to afford enough food for all of the children and herself and her children.  Abigail Butts, LCSW [facility] [phone]

## 2023-04-28 ENCOUNTER — Telehealth: Payer: Self-pay | Admitting: Clinical

## 2023-04-29 ENCOUNTER — Telehealth: Payer: Self-pay | Admitting: Clinical

## 2023-04-29 NOTE — Telephone Encounter (Signed)
Integrated Behavioral Health Progress Note  04/29/2023 Name: Deanna Myers MRN: [id number] DOB: Apr 17, 1991 [name] is a 32 y.o. year old female who sees Ivonne Andrew, NP for primary care. LCSW was initially consulted to assist the patient with mental health concerns.  Interpreter: No.   Interpreter Name & Language: none  Assessment: Patient experiencing mental health concerns; anxiety and depression. CSW follows for Integrated Behavioral Health (IBH) counseling.   Ongoing Intervention: Spoke with patient on the phone today. Patient was unable to have our virtual appointment yesterday. She continues to navigate caring for her nieces and nephews and is seeking custody of them; she's working with a Psychologist, occupational. Supportive counseling provided including emotional validation and supportive reflection.   Abigail Butts, LCSW [facility] [phone]

## 2023-05-12 ENCOUNTER — Telehealth: Payer: Self-pay | Admitting: Clinical

## 2023-05-18 ENCOUNTER — Encounter: Payer: Self-pay | Admitting: Internal Medicine

## 2023-06-09 ENCOUNTER — Ambulatory Visit: Payer: Self-pay | Admitting: Nurse Practitioner

## 2023-06-16 ENCOUNTER — Ambulatory Visit: Payer: Self-pay | Admitting: Nurse Practitioner

## 2023-06-19 DIAGNOSIS — I890 Lymphedema, not elsewhere classified: Secondary | ICD-10-CM | POA: Insufficient documentation

## 2023-06-19 DIAGNOSIS — M1990 Unspecified osteoarthritis, unspecified site: Secondary | ICD-10-CM | POA: Insufficient documentation

## 2023-06-19 NOTE — Progress Notes (Deleted)
MRN : [id number]  Deanna Myers is a 32 y.o. (01-16-1991) female who presents with chief complaint of legs swell.  History of Present Illness:   The patient returns to the office for followup evaluation regarding leg swelling.  She was last seen in March 2024 for leg swelling.  The swelling has persisted and the pain associated with swelling continues. There have not been any interval development of a ulcerations or wounds.  Since the previous visit the patient has been wearing graduated compression stockings and has noted little if any improvement in the lymphedema. The patient has been using compression routinely morning until night.  The patient also states elevation during the day and exercise is being done too.  No outpatient medications have been marked as taking for the 06/20/23 encounter (Appointment) with Gilda Crease, Latina Craver, MD.    Past Medical History:  Diagnosis Date   ADHD (attention deficit hyperactivity disorder)    since childhood   Asthma    inhaler last used prior to pregnancy   BV (bacterial vaginosis)    Chlamydia    GERD (gastroesophageal reflux disease)    Gonorrhea    Hyperemesis    G3    Past Surgical History:  Procedure Laterality Date   CESAREAN SECTION N/A 12/21/2015   Procedure: CESAREAN SECTION;  Surgeon: [name], MD;  Location: [facility];  Service: Obstetrics;  Laterality: N/A;   LAPAROSCOPIC APPENDECTOMY N/A 12/21/2021   Procedure: APPENDECTOMY LAPAROSCOPIC;  Surgeon: Fritzi Mandes, MD;  Location: [facility] OR;  Service: General;  Laterality: N/A;    Social History Social History   Tobacco Use   Smoking status: Never   Smokeless tobacco: Never  Vaping Use   Vaping status: Never Used  Substance Use Topics   Alcohol use: Yes    Comment: social   Drug use: No    Family History Family History  Problem Relation Age of Onset   Hypertension Father    Hypertension Mother    Kidney disease Mother     Cervical cancer Paternal Aunt    Congestive Heart Failure Maternal Grandmother    Breast cancer Paternal Grandfather     Allergies  Allergen Reactions   Shellfish Allergy Anaphylaxis   Adhesive [Tape] Swelling and Other (See Comments)    Reaction:  Bruising    Latex Hives   Betadine [Povidone Iodine] Rash     REVIEW OF SYSTEMS (Negative unless checked)  Constitutional: [] Weight loss  [] Fever  [] Chills Cardiac: [] Chest pain   [] Chest pressure   [] Palpitations   [] Shortness of breath when laying flat   [] Shortness of breath with exertion. Vascular:  [] Pain in legs with walking   [x] Pain in legs with standing  [] History of DVT   [] Phlebitis   [x] Swelling in legs   [] Varicose veins   [] Non-healing ulcers Pulmonary:   [] Uses home oxygen   [] Productive cough   [] Hemoptysis   [] Wheeze  [] COPD   [] Asthma Neurologic:  [] Dizziness   [] Seizures   [] History of stroke   [] History of TIA  [] Aphasia   [] Vissual changes   [] Weakness or numbness in arm   [] Weakness or numbness in leg Musculoskeletal:   [] Joint swelling   [] Joint pain   [] Low back pain Hematologic:  [] Easy bruising  [] Easy bleeding   [] Hypercoagulable state   [] Anemic Gastrointestinal:  [] Diarrhea   []   Vomiting  [] Gastroesophageal reflux/heartburn   [] Difficulty swallowing. Genitourinary:  [] Chronic kidney disease   [] Difficult urination  [] Frequent urination   [] Blood in urine Skin:  [] Rashes   [] Ulcers  Psychological:  [] History of anxiety   []  History of major depression.  Physical Examination  There were no vitals filed for this visit. There is no height or weight on file to calculate BMI. Gen: WD/WN, NAD Head: [place]/AT, No temporalis wasting.  Ear/Nose/Throat: Hearing grossly intact, nares w/o erythema or drainage, pinna without lesions Eyes: PER, EOMI, sclera nonicteric.  Neck: Supple, no gross masses.  No JVD.  Pulmonary:  Good air movement, no audible wheezing, no use of accessory muscles.  Cardiac: RRR, precordium not  hyperdynamic. Vascular:  scattered varicosities present bilaterally.  Mild venous stasis changes to the legs bilaterally.  3-4+ soft pitting edema, CEAP [id number]  Vessel Right Left  Radial Palpable Palpable  Gastrointestinal: soft, non-distended. No guarding/no peritoneal signs.  Musculoskeletal: M/S 5/5 throughout.  No deformity.  Neurologic: CN 2-12 intact. Pain and light touch intact in extremities.  Symmetrical.  Speech is fluent. Motor exam as listed above. Psychiatric: Judgment intact, Mood & affect appropriate for pt's clinical situation. Dermatologic: Venous rashes no ulcers noted.  No changes consistent with cellulitis. Lymph : No lichenification or skin changes of chronic lymphedema.  CBC Lab Results  Component Value Date   WBC 6.1 12/27/2022   HGB 11.6 12/27/2022   HCT 35.2 12/27/2022   MCV 84 12/27/2022   PLT 371 12/27/2022    BMET    Component Value Date/Time   NA 141 12/27/2022 1102   K 4.4 12/27/2022 1102   CL 105 12/27/2022 1102   CO2 24 12/27/2022 1102   GLUCOSE 90 12/27/2022 1102   GLUCOSE 86 12/20/2021 1916   BUN 7 12/27/2022 1102   CREATININE 0.67 12/27/2022 1102   CALCIUM 9.2 12/27/2022 1102   GFRNONAA >60 12/20/2021 1916   GFRAA >60 07/03/2017 1620   CrCl cannot be calculated (Patient's most recent lab result is older than the maximum 21 days allowed.).  COAG No results found for: "INR", "PROTIME"  Radiology No results found.   Assessment/Plan There are no diagnoses linked to this encounter.   Levora Dredge, MD  06/19/2023 3:25 PM

## 2023-06-20 ENCOUNTER — Ambulatory Visit (INDEPENDENT_AMBULATORY_CARE_PROVIDER_SITE_OTHER): Payer: Medicaid Other | Admitting: Vascular Surgery

## 2023-06-20 DIAGNOSIS — M159 Polyosteoarthritis, unspecified: Secondary | ICD-10-CM

## 2023-06-20 DIAGNOSIS — I890 Lymphedema, not elsewhere classified: Secondary | ICD-10-CM

## 2023-06-28 ENCOUNTER — Ambulatory Visit: Payer: Self-pay | Admitting: Nurse Practitioner

## 2023-06-28 ENCOUNTER — Encounter: Payer: Self-pay | Admitting: Nurse Practitioner

## 2023-06-28 VITALS — BP 128/71 | HR 82 | Temp 98.6°F | Resp 12 | Ht 66.0 in | Wt 249.0 lb

## 2023-06-28 DIAGNOSIS — N898 Other specified noninflammatory disorders of vagina: Secondary | ICD-10-CM | POA: Insufficient documentation

## 2023-06-28 DIAGNOSIS — Z113 Encounter for screening for infections with a predominantly sexual mode of transmission: Secondary | ICD-10-CM | POA: Diagnosis not present

## 2023-06-28 NOTE — Progress Notes (Signed)
Acute Office Visit  Subjective:     Patient ID: Deanna Myers, female    DOB: 1991-07-11, 32 y.o.   MRN: [id number]  Chief Complaint  Patient presents with   Vaginal Discharge    Vaginal Discharge The patient's primary symptoms include vaginal discharge. Pertinent negatives include no diarrhea, nausea or vomiting.   Ms. Brignola  has a past medical history of ADHD (attention deficit hyperactivity disorder), Asthma, BV (bacterial vaginosis), Chlamydia, GERD (gastroesophageal reflux disease), Gonorrhea, and Hyperemesis. Patient is in today for  c/o white colored discharge, abdominal discomfort for about a month. Has one sexual partner.  She denies fever, chills, dysuria, pelvic pain, rashes. She is interested in STD testing.   Review of Systems  Constitutional: Negative.   HENT: Negative.    Eyes: Negative.   Respiratory: Negative.    Cardiovascular: Negative.   Gastrointestinal: Negative.  Negative for diarrhea, nausea, rectal pain and vomiting.       Abdominal discomfort  Endocrine: Negative.   Genitourinary:  Positive for vaginal discharge.  Musculoskeletal: Negative.   Skin: Negative.   Allergic/Immunologic: Negative.   Neurological: Negative.   Psychiatric/Behavioral: Negative.          Objective:    BP 128/71 (BP Location: Right Arm, Patient Position: Sitting, Cuff Size: Large)   Pulse 82   Temp 98.6 F (37 C)   Resp 12   Ht 5\' 6"  (1.676 m)   Wt 249 lb (112.9 kg)   LMP 06/13/2023   SpO2 99%   BMI 40.19 kg/m    Physical Exam Vitals and nursing note reviewed.  Constitutional:      General: She is not in acute distress.    Appearance: Normal appearance. She is obese. She is not ill-appearing, toxic-appearing or diaphoretic.  HENT:     Mouth/Throat:     Mouth: Mucous membranes are moist.     Pharynx: Oropharynx is clear. No oropharyngeal exudate or posterior oropharyngeal erythema.  Eyes:     General: No scleral icterus.       Right eye: No discharge.         Left eye: No discharge.     Extraocular Movements: Extraocular movements intact.     Conjunctiva/sclera: Conjunctivae normal.  Cardiovascular:     Rate and Rhythm: Normal rate and regular rhythm.     Pulses: Normal pulses.     Heart sounds: Normal heart sounds. No murmur heard.    No friction rub. No gallop.  Pulmonary:     Effort: Pulmonary effort is normal. No respiratory distress.     Breath sounds: Normal breath sounds. No stridor. No wheezing, rhonchi or rales.  Chest:     Chest wall: No tenderness.  Abdominal:     General: There is no distension.     Palpations: Abdomen is soft.     Tenderness: There is no abdominal tenderness. There is no right CVA tenderness, left CVA tenderness or guarding.  Musculoskeletal:        General: No swelling, tenderness, deformity or signs of injury.     Right lower leg: No edema.     Left lower leg: No edema.  Skin:    General: Skin is warm and dry.     Capillary Refill: Capillary refill takes less than 2 seconds.     Coloration: Skin is not jaundiced or pale.     Findings: No bruising, erythema or lesion.  Neurological:     Mental Status: She is alert and oriented to  person, place, and time.     Motor: No weakness.     Coordination: Coordination normal.     Gait: Gait normal.  Psychiatric:        Mood and Affect: Mood normal.        Behavior: Behavior normal.        Thought Content: Thought content normal.        Judgment: Judgment normal.     No results found for any visits on 06/28/23.      Assessment & Plan:   Problem List Items Addressed This Visit       Other   Vaginal discharge - Primary    1. Vaginal discharge  - NuSwab Vaginitis Plus (VG+)        Relevant Orders   NuSwab Vaginitis Plus (VG+)   HepB+HepC+HIV Panel   RPR   Screening examination for STI    2. Screening examination for STI  - HepB+HepC+HIV Panel - RPR       Relevant Orders   HepB+HepC+HIV Panel   RPR    No orders of the defined  types were placed in this encounter.   No follow-ups on file.  Donell Beers, FNP

## 2023-06-28 NOTE — Patient Instructions (Signed)
1. Vaginal discharge  - NuSwab Vaginitis Plus (VG+) - HepB+HepC+HIV Panel - RPR    It is important that you exercise regularly at least 30 minutes 5 times a week as tolerated  Think about what you will eat, plan ahead. Choose " clean, green, fresh or frozen" over canned, processed or packaged foods which are more sugary, salty and fatty. 70 to 75% of food eaten should be vegetables and fruit. Three meals at set times with snacks allowed between meals, but they must be fruit or vegetables. Aim to eat over a 12 hour period , example 7 am to 7 pm, and STOP after  your last meal of the day. Drink water,generally about 64 ounces per day, no other drink is as healthy. Fruit juice is best enjoyed in a healthy way, by EATING the fruit.  Thanks for choosing [facility] we consider it a privelige to serve you.

## 2023-06-28 NOTE — Assessment & Plan Note (Signed)
1. Vaginal discharge  - NuSwab Vaginitis Plus (VG+)

## 2023-06-28 NOTE — Assessment & Plan Note (Signed)
2. Screening examination for STI  - HepB+HepC+HIV Panel - RPR

## 2023-06-28 NOTE — Progress Notes (Signed)
Pt is here for STD testing   Complaining of abnormal discharge and pelvic pressure requesting std testing

## 2023-06-29 LAB — HEPB+HEPC+HIV PANEL
HIV Screen 4th Generation wRfx: NONREACTIVE
Hep B C IgM: NEGATIVE
Hep B Core Total Ab: NEGATIVE
Hep B E Ab: NONREACTIVE
Hep B E Ag: NEGATIVE
Hep B Surface Ab, Qual: REACTIVE
Hep C Virus Ab: NONREACTIVE
Hepatitis B Surface Ag: NEGATIVE

## 2023-06-29 LAB — RPR: RPR Ser Ql: NONREACTIVE

## 2023-06-30 LAB — NUSWAB VAGINITIS PLUS (VG+)
Atopobium vaginae: HIGH Score — AB
BVAB 2: HIGH Score — AB
Candida albicans, NAA: NEGATIVE
Candida glabrata, NAA: NEGATIVE
Chlamydia trachomatis, NAA: NEGATIVE
Megasphaera 1: HIGH Score — AB
Neisseria gonorrhoeae, NAA: NEGATIVE
Trich vag by NAA: NEGATIVE

## 2023-07-01 ENCOUNTER — Other Ambulatory Visit: Payer: Self-pay | Admitting: Nurse Practitioner

## 2023-07-01 DIAGNOSIS — B9689 Other specified bacterial agents as the cause of diseases classified elsewhere: Secondary | ICD-10-CM

## 2023-07-01 MED ORDER — METRONIDAZOLE 500 MG PO TABS
500.0000 mg | ORAL_TABLET | Freq: Two times a day (BID) | ORAL | 0 refills | Status: AC
Start: 2023-07-01 — End: 2023-07-08

## 2023-07-08 ENCOUNTER — Other Ambulatory Visit (INDEPENDENT_AMBULATORY_CARE_PROVIDER_SITE_OTHER): Payer: Self-pay | Admitting: Vascular Surgery

## 2023-07-08 ENCOUNTER — Ambulatory Visit (INDEPENDENT_AMBULATORY_CARE_PROVIDER_SITE_OTHER): Payer: Medicaid Other

## 2023-07-08 DIAGNOSIS — M7989 Other specified soft tissue disorders: Secondary | ICD-10-CM | POA: Diagnosis not present

## 2023-07-18 ENCOUNTER — Telehealth: Payer: Self-pay | Admitting: Nurse Practitioner

## 2023-07-18 ENCOUNTER — Other Ambulatory Visit: Payer: Self-pay | Admitting: Nurse Practitioner

## 2023-07-18 DIAGNOSIS — B9689 Other specified bacterial agents as the cause of diseases classified elsewhere: Secondary | ICD-10-CM

## 2023-07-18 MED ORDER — METRONIDAZOLE 0.75 % VA GEL
1.0000 | Freq: Every day | VAGINAL | 0 refills | Status: AC
Start: 2023-07-18 — End: 2023-07-23

## 2023-07-18 NOTE — Telephone Encounter (Signed)
Pt called requesting her medication be swapped for the gel - medication for BV   She said the last dr she saw gave her pills but she needs gel

## 2023-07-22 ENCOUNTER — Encounter: Payer: Self-pay | Admitting: Nurse Practitioner

## 2023-07-22 ENCOUNTER — Ambulatory Visit: Payer: Medicaid Other | Admitting: Nurse Practitioner

## 2023-07-22 VITALS — BP 125/77 | HR 91 | Wt 246.0 lb

## 2023-07-22 DIAGNOSIS — M79604 Pain in right leg: Secondary | ICD-10-CM

## 2023-07-22 MED ORDER — TRAMADOL HCL 50 MG PO TABS
50.0000 mg | ORAL_TABLET | Freq: Three times a day (TID) | ORAL | 0 refills | Status: AC | PRN
Start: 2023-07-22 — End: 2023-07-27

## 2023-07-22 NOTE — Progress Notes (Signed)
Subjective   Patient ID: Deanna Myers, female    DOB: 1991/06/19, 32 y.o.   MRN: [id number]  Chief Complaint  Patient presents with   Leg Pain   Leg Swelling    Referring provider: Ivonne Andrew, NP  Deanna Myers is a 32 y.o. female with Past Medical History: No date: ADHD (attention deficit hyperactivity disorder)     Comment:  since childhood No date: Asthma     Comment:  inhaler last used prior to pregnancy No date: BV (bacterial vaginosis) No date: Chlamydia No date: GERD (gastroesophageal reflux disease) No date: Gonorrhea No date: Hyperemesis     Comment:  G3   HPI  Patient presents today for an acute visit.  She states that she has been having severe right leg and swelling.  She did have a recent visit with vascular and was told that she has a Baker's cyst behind her knee.  We will place urgent referral to orthopedics for her today.  We will give a short course of Ultram for pain. Denies f/c/s, n/v/d, hemoptysis, PND, leg swelling Denies chest pain or edema    Allergies  Allergen Reactions   Shellfish Allergy Anaphylaxis   Adhesive [Tape] Swelling and Other (See Comments)    Reaction:  Bruising    Latex Hives   Betadine [Povidone Iodine] Rash    Immunization History  Administered Date(s) Administered   Pneumococcal Polysaccharide-23 01/17/2014   Tdap 01/17/2014, 10/08/2015    Tobacco History: Social History   Tobacco Use  Smoking Status Never  Smokeless Tobacco Never   Counseling given: Not Answered   Outpatient Encounter Medications as of 07/22/2023  Medication Sig   metroNIDAZOLE (METROGEL) 0.75 % vaginal gel Place 1 Applicatorful vaginally at bedtime for 5 days.   traMADol (ULTRAM) 50 MG tablet Take 1 tablet (50 mg total) by mouth every 8 (eight) hours as needed for up to 5 days.   acetaminophen (TYLENOL) 500 MG tablet Take 1,000 mg by mouth every 6 (six) hours as needed for moderate pain or headache.   benzonatate (TESSALON) 100  MG capsule Take 1 capsule (100 mg total) by mouth 2 (two) times daily as needed for cough.   dicyclomine (BENTYL) 10 MG capsule Take 1 capsule (10 mg total) by mouth 4 (four) times daily as needed for spasms.   furosemide (LASIX) 20 MG tablet Take 1 tablet (20 mg total) by mouth daily for 5 days.   gabapentin (NEURONTIN) 100 MG capsule Take 1 capsule (100 mg total) by mouth 3 (three) times daily.   ibuprofen (ADVIL) 600 MG tablet Take 1 tablet (600 mg total) by mouth every 6 (six) hours as needed for moderate pain, fever or cramping. Take with food   levocetirizine (XYZAL ALLERGY 24HR) 5 MG tablet Take 1 tablet (5 mg total) by mouth every evening.   linaclotide (LINZESS) 72 MCG capsule Take 1 capsule (72 mcg total) by mouth daily before breakfast.   meloxicam (MOBIC) 15 MG tablet Take 1 tablet (15 mg total) by mouth daily.   methocarbamol (ROBAXIN) 500 MG tablet Take 1 tablet (500 mg total) by mouth every 6 (six) hours as needed for muscle spasms (pelvic cramps).   ondansetron (ZOFRAN) 4 MG tablet Take 1 tablet (4 mg total) by mouth every 8 (eight) hours as needed for nausea or vomiting.   phenazopyridine (PYRIDIUM) 200 MG tablet Take 1 tablet (200 mg total) by mouth 3 (three) times daily.   Rimegepant Sulfate (NURTEC PO) Take by mouth.  SUMAtriptan (IMITREX) 20 MG/ACT nasal spray Place 1 spray (20 mg total) into the nose as needed for migraine or headache. May repeat in 2 hours if headache persists or recurs.  Maximum doses in 24 hours.   [DISCONTINUED] albuterol (PROVENTIL HFA;VENTOLIN HFA) 108 (90 BASE) MCG/ACT inhaler Inhale 1-2 puffs into the lungs every 6 (six) hours as needed for wheezing or shortness of breath.   [DISCONTINUED] omeprazole (PRILOSEC) 20 MG capsule Take 1 capsule (20 mg total) by mouth 2 (two) times daily.   [DISCONTINUED] SUMAtriptan (IMITREX) 100 MG tablet Take 1 tablet earliest onset of migraine.  May repeat once in 2 hours if headache persists or recurs.  Do not exceed 2  tablets in 24 hours   No facility-administered encounter medications on file as of 07/22/2023.    Review of Systems  Review of Systems  Constitutional: Negative.   HENT: Negative.    Cardiovascular: Negative.   Gastrointestinal: Negative.   Musculoskeletal:        Right leg swelling and pain  Allergic/Immunologic: Negative.   Neurological: Negative.   Psychiatric/Behavioral: Negative.       Objective:   BP 125/77 (BP Location: Right Arm, Patient Position: Sitting, Cuff Size: Large)   Pulse 91   Wt 246 lb (111.6 kg)   LMP 06/13/2023   SpO2 99%   BMI 39.71 kg/m   Wt Readings from Last 5 Encounters:  07/22/23 246 lb (111.6 kg)  06/28/23 249 lb (112.9 kg)  02/12/23 248 lb (112.5 kg)  01/21/23 249 lb 6.4 oz (113.1 kg)  12/27/22 251 lb 9.6 oz (114.1 kg)     Physical Exam Vitals and nursing note reviewed.  Constitutional:      General: She is not in acute distress.    Appearance: She is well-developed.  Cardiovascular:     Rate and Rhythm: Normal rate and regular rhythm.  Pulmonary:     Effort: Pulmonary effort is normal.     Breath sounds: Normal breath sounds.  Musculoskeletal:     Right knee: Swelling present. Tenderness present.     Left knee: Swelling present.  Neurological:     Mental Status: She is alert and oriented to person, place, and time.       Assessment & Plan:   Right leg pain -     traMADol HCl; Take 1 tablet (50 mg total) by mouth every 8 (eight) hours as needed for up to 5 days.  Dispense: 15 tablet; Refill: 0 -     Ambulatory referral to Orthopedics     Return if symptoms worsen or fail to improve.     Ivonne Andrew, NP 07/22/2023

## 2023-07-22 NOTE — Patient Instructions (Signed)
1. Right leg pain  - traMADol (ULTRAM) 50 MG tablet; Take 1 tablet (50 mg total) by mouth every 8 (eight) hours as needed for up to 5 days.  Dispense: 15 tablet; Refill: 0 - AMB referral to orthopedics   Follow up:  Follow up in 3 months

## 2023-08-02 ENCOUNTER — Emergency Department (HOSPITAL_BASED_OUTPATIENT_CLINIC_OR_DEPARTMENT_OTHER)
Admission: EM | Admit: 2023-08-02 | Discharge: 2023-08-02 | Disposition: A | Discharge status: Home / Self Care | Payer: Medicaid Other | Attending: Emergency Medicine | Admitting: Emergency Medicine

## 2023-08-02 ENCOUNTER — Encounter (HOSPITAL_BASED_OUTPATIENT_CLINIC_OR_DEPARTMENT_OTHER): Payer: Self-pay | Admitting: Emergency Medicine

## 2023-08-02 ENCOUNTER — Ambulatory Visit
Admission: EM | Admit: 2023-08-02 | Discharge: 2023-08-02 | Disposition: A | Discharge status: Home / Self Care | Payer: Medicaid Other

## 2023-08-02 ENCOUNTER — Telehealth: Payer: Medicaid Other | Admitting: Nurse Practitioner

## 2023-08-02 ENCOUNTER — Other Ambulatory Visit: Payer: Self-pay

## 2023-08-02 DIAGNOSIS — K0889 Other specified disorders of teeth and supporting structures: Secondary | ICD-10-CM

## 2023-08-02 DIAGNOSIS — Z91040 Latex allergy status: Secondary | ICD-10-CM | POA: Diagnosis not present

## 2023-08-02 MED ORDER — OXYCODONE HCL 5 MG PO TABS: 5.0000 mg | ORAL_TABLET | Freq: Four times a day (QID) | ORAL | 0 refills | Status: DC | PRN

## 2023-08-02 MED ORDER — OXYCODONE HCL 5 MG PO TABS
5.0000 mg | ORAL_TABLET | Freq: Once | ORAL | Status: AC
  Administered 2023-08-02: 5 mg via ORAL
  Filled 2023-08-02: qty 1

## 2023-08-02 NOTE — Discharge Instructions (Signed)
These discontinue your tramadol.  Take your oxycodone as prescribed.  Do not mix with alcohol or drugs or dangerous activities including driving as this medication is sedating.  Continue 1000 mg of Tylenol every 6 hours as needed for pain and 600 mg ibuprofen every 8 hours as needed for pain.  If you continue to have pain then use Roxicodone for breakthrough pain.  Follow-up with your dentist Thursday as you have arranged.

## 2023-08-02 NOTE — Assessment & Plan Note (Signed)
Continue amoxicillin ordered by the dentist  Patient prescription for ibuprofen 800 mg tablet.

## 2023-08-02 NOTE — ED Provider Notes (Signed)
[name]    CSN: [id number] Arrival date & time: 08/02/23  0932      History   Chief Complaint No chief complaint on file.   HPI Deanna Myers is a 32 y.o. female.   Patient presents for dental pain.  Reports that she has had chipped teeth for "some time".  Reports the pain increased in intensity about 3 days ago.  She called her dentist who prescribed her amoxicillin over the phone but she is not sure the dosage.  She also reports that she is currently prescribed tramadol which she takes for her leg.  She reports that she has taken this with minimal improvement in pain.  Has also been taking anti-inflammatory medication over-the-counter.  Has an appointment with dentist in 2 days.     Past Medical History:  Diagnosis Date   ADHD (attention deficit hyperactivity disorder)    since childhood   Asthma    inhaler last used prior to pregnancy   BV (bacterial vaginosis)    Chlamydia    GERD (gastroesophageal reflux disease)    Gonorrhea    Hyperemesis    G3    Patient Active Problem List   Diagnosis Date Noted   Vaginal discharge 06/28/2023   Screening examination for STI 06/28/2023   Lymphedema 06/19/2023   Osteoarthritis 06/19/2023   Lower extremity edema 12/27/2022   Right foot pain 11/15/2022   Acute cough 10/22/2022   Pain, joint, pelvic region, left 08/13/2022   Left leg pain 06/30/2022   Healthcare maintenance 04/01/2022   Active labor at term 12/21/2015   UTI (urinary tract infection) 06/13/2015   Pyelonephritis affecting pregnancy in first trimester 06/12/2015   Folliculitis 02/13/2013    Past Surgical History:  Procedure Laterality Date   CESAREAN SECTION N/A 12/21/2015   Procedure: CESAREAN SECTION;  Surgeon: [name], MD;  Location: [facility];  Service: Obstetrics;  Laterality: N/A;   LAPAROSCOPIC APPENDECTOMY N/A 12/21/2021   Procedure: APPENDECTOMY LAPAROSCOPIC;  Surgeon: Fritzi Mandes, MD;  Location: [facility];  Service:  General;  Laterality: N/A;    OB History     Gravida  4   Para  2   Term  2   Preterm      AB  1   Living  2      SAB  1   IAB      Ectopic      Multiple  0   Live Births  2            Home Medications    Prior to Admission medications   Medication Sig Start Date End Date Taking? Authorizing Provider  acetaminophen (TYLENOL) 500 MG tablet Take 1,000 mg by mouth every 6 (six) hours as needed for moderate pain or headache.    [provider]  benzonatate (TESSALON) 100 MG capsule Take 1 capsule (100 mg total) by mouth 2 (two) times daily as needed for cough. 10/22/22   Ivonne Andrew, NP  dicyclomine (BENTYL) 10 MG capsule Take 1 capsule (10 mg total) by mouth 4 (four) times daily as needed for spasms. 08/06/22   Imogene Burn, MD  furosemide (LASIX) 20 MG tablet Take 1 tablet (20 mg total) by mouth daily for 5 days. 12/27/22 01/21/23  Ivonne Andrew, NP  gabapentin (NEURONTIN) 100 MG capsule Take 1 capsule (100 mg total) by mouth 3 (three) times daily. 05/28/22   Ivonne Andrew, NP  ibuprofen (ADVIL) 600 MG tablet Take 1  tablet (600 mg total) by mouth every 6 (six) hours as needed for moderate pain, fever or cramping. Take with food 02/12/23   Crain, Whitney L, PA  levocetirizine (XYZAL ALLERGY 24HR) 5 MG tablet Take 1 tablet (5 mg total) by mouth every evening. 02/16/23 08/15/23  Ivonne Andrew, NP  linaclotide Karlene Einstein) 72 MCG capsule Take 1 capsule (72 mcg total) by mouth daily before breakfast. 10/06/22   Imogene Burn, MD  meloxicam (MOBIC) 15 MG tablet Take 1 tablet (15 mg total) by mouth daily. 12/20/22   Madelyn Brunner, DO  methocarbamol (ROBAXIN) 500 MG tablet Take 1 tablet (500 mg total) by mouth every 6 (six) hours as needed for muscle spasms (pelvic cramps). 02/16/23   Ivonne Andrew, NP  ondansetron (ZOFRAN) 4 MG tablet Take 1 tablet (4 mg total) by mouth every 8 (eight) hours as needed for nausea or vomiting. 09/08/22   [name], DO   phenazopyridine (PYRIDIUM) 200 MG tablet Take 1 tablet (200 mg total) by mouth 3 (three) times daily. 01/18/23   Radford Pax, NP  Rimegepant Sulfate (NURTEC PO) Take by mouth.    [provider]  SUMAtriptan (IMITREX) 20 MG/ACT nasal spray Place 1 spray (20 mg total) into the nose as needed for migraine or headache. May repeat in 2 hours if headache persists or recurs.  Maximum doses in 24 hours. 11/12/22   Everlena Cooper, Adam R, DO  albuterol (PROVENTIL HFA;VENTOLIN HFA) 108 (90 BASE) MCG/ACT inhaler Inhale 1-2 puffs into the lungs every 6 (six) hours as needed for wheezing or shortness of breath.  05/21/20  [provider]  omeprazole (PRILOSEC) 20 MG capsule Take 1 capsule (20 mg total) by mouth 2 (two) times daily. 07/03/17 09/18/19  [name], MD  SUMAtriptan (IMITREX) 100 MG tablet Take 1 tablet earliest onset of migraine.  May repeat once in 2 hours if headache persists or recurs.  Do not exceed 2 tablets in 24 hours 02/10/18 05/21/20  [name], DO    Family History Family History  Problem Relation Age of Onset   Hypertension Father    Hypertension Mother    Kidney disease Mother    Cervical cancer Paternal Aunt    Congestive Heart Failure Maternal Grandmother    Breast cancer Paternal Grandfather     Social History Social History   Tobacco Use   Smoking status: Never   Smokeless tobacco: Never  Vaping Use   Vaping status: Never Used  Substance Use Topics   Alcohol use: Yes    Comment: social   Drug use: No     Allergies   Shellfish allergy, Adhesive [tape], Latex, and Betadine [povidone iodine]   Review of Systems Review of Systems Per HPI  Physical Exam Triage Vital Signs ED Triage Vitals [08/02/23 1029]  Encounter Vitals Group     BP 123/83     Systolic BP Percentile      Diastolic BP Percentile      Pulse Rate 72     Resp 16     Temp 98.3 F (36.8 C)     Temp Source Oral     SpO2 98 %     Weight      Height      Head Circumference       Peak Flow      Pain Score      Pain Loc      Pain Education      Exclude from Growth Chart  No data found.  Updated Vital Signs BP 123/83 (BP Location: Left Arm)   Pulse 72   Temp 98.3 F (36.8 C) (Oral)   Resp 16   LMP 07/29/2023 (Approximate)   SpO2 98%   Visual Acuity Right Eye Distance:   Left Eye Distance:   Bilateral Distance:    Right Eye Near:   Left Eye Near:    Bilateral Near:     Physical Exam Constitutional:      Comments: Patient walked out of exam room prior to physical exam being able to be performed.       UC Treatments / Results  Labs (all labs ordered are listed, but only abnormal results are displayed) Labs Reviewed - No data to display  EKG   Radiology No results found.  Procedures Procedures (including critical care time)  Medications Ordered in UC Medications - No data to display  Initial Impression / Assessment and Plan / UC Course  I have reviewed the triage vital signs and the nursing notes.  Pertinent labs & imaging results that were available during my care of the patient were reviewed by me and considered in my medical decision making (see chart for details).     Patient stated that she is requesting pain medication today.  Discussed with patient that I am not able to provide any additional narcotic pain medication given that she is currently taking tramadol but can offer IM Toradol.  Patient then became very upset, stood up, and walked out of exam room.  I was not able to perform physical exam or any additional management prior to patient leaving. Final Clinical Impressions(s) / UC Diagnoses   Final diagnoses:  None   Discharge Instructions   None    ED Prescriptions   None    I have reviewed the PDMP during this encounter.   Gustavus Bryant, [place] 08/02/23 1051

## 2023-08-02 NOTE — ED Triage Notes (Signed)
Pt with dental pain.  Has an appt with oral surgeon next week for consult.  Pt states she has been taking OTC meds without relief.

## 2023-08-02 NOTE — ED Provider Notes (Signed)
Deanna Myers AT [facility] Provider Note   CSN: [id number] Arrival date & time: 08/02/23  1418     History  Chief Complaint  Patient presents with   Dental Pain    Deanna Myers is a 32 y.o. female.  Patient here with right upper dental pain.  Symptoms for the last few days.  She has been on amoxicillin since yesterday that was prescribed by her dentist who can see her on Thursday.  She has had her wisdom teeth removed in the past but her right upper molar is impacted as well and may need to have that removed.  She denies any fevers or chills.  No facial swelling.  No trismus or drooling.  No difficulty opening mouth.  She has a tramadol prescription for knee pain but that has not helped.  That was filled a few weeks ago.  The history is provided by the patient.       Home Medications Prior to Admission medications   Medication Sig Start Date End Date Taking? Authorizing Provider  oxyCODONE (ROXICODONE) 5 MG immediate release tablet Take 1 tablet (5 mg total) by mouth every 6 (six) hours as needed for up to 8 doses. 08/02/23  Yes Sieara Bremer, DO  acetaminophen (TYLENOL) 500 MG tablet Take 1,000 mg by mouth every 6 (six) hours as needed for moderate pain or headache.    [provider]  amoxicillin (AMOXIL) 500 MG capsule Take 500 mg by mouth every 8 (eight) hours. 08/01/23   [provider]  benzonatate (TESSALON) 100 MG capsule Take 1 capsule (100 mg total) by mouth 2 (two) times daily as needed for cough. 10/22/22   Ivonne Andrew, NP  dicyclomine (BENTYL) 10 MG capsule Take 1 capsule (10 mg total) by mouth 4 (four) times daily as needed for spasms. 08/06/22   Imogene Burn, MD  furosemide (LASIX) 20 MG tablet Take 1 tablet (20 mg total) by mouth daily for 5 days. 12/27/22 01/21/23  Ivonne Andrew, NP  gabapentin (NEURONTIN) 100 MG capsule Take 1 capsule (100 mg total) by mouth 3 (three) times daily. 05/28/22   Ivonne Andrew, NP  ibuprofen (ADVIL) 600 MG tablet Take 1 tablet (600 mg total) by mouth every 6 (six) hours as needed for moderate pain, fever or cramping. Take with food 02/12/23   Crain, Whitney L, PA  levocetirizine (XYZAL ALLERGY 24HR) 5 MG tablet Take 1 tablet (5 mg total) by mouth every evening. 02/16/23 08/15/23  Ivonne Andrew, NP  linaclotide Karlene Einstein) 72 MCG capsule Take 1 capsule (72 mcg total) by mouth daily before breakfast. 10/06/22   Imogene Burn, MD  meloxicam (MOBIC) 15 MG tablet Take 1 tablet (15 mg total) by mouth daily. 12/20/22   Madelyn Brunner, DO  methocarbamol (ROBAXIN) 500 MG tablet Take 1 tablet (500 mg total) by mouth every 6 (six) hours as needed for muscle spasms (pelvic cramps). 02/16/23   Ivonne Andrew, NP  ondansetron (ZOFRAN) 4 MG tablet Take 1 tablet (4 mg total) by mouth every 8 (eight) hours as needed for nausea or vomiting. 09/08/22   [name], DO  phenazopyridine (PYRIDIUM) 200 MG tablet Take 1 tablet (200 mg total) by mouth 3 (three) times daily. 01/18/23   Radford Pax, NP  Rimegepant Sulfate (NURTEC PO) Take by mouth.    [provider]  SUMAtriptan (IMITREX) 20 MG/ACT nasal spray Place 1 spray (20 mg total) into the nose as needed  for migraine or headache. May repeat in 2 hours if headache persists or recurs.  Maximum doses in 24 hours. 11/12/22   Everlena Cooper, Jilene Spohr R, DO  albuterol (PROVENTIL HFA;VENTOLIN HFA) 108 (90 BASE) MCG/ACT inhaler Inhale 1-2 puffs into the lungs every 6 (six) hours as needed for wheezing or shortness of breath.  05/21/20  [provider]  omeprazole (PRILOSEC) 20 MG capsule Take 1 capsule (20 mg total) by mouth 2 (two) times daily. 07/03/17 09/18/19  [name], MD  SUMAtriptan (IMITREX) 100 MG tablet Take 1 tablet earliest onset of migraine.  May repeat once in 2 hours if headache persists or recurs.  Do not exceed 2 tablets in 24 hours 02/10/18 05/21/20  [name], DO      Allergies    Shellfish allergy, Adhesive [tape], Latex,  and Betadine [povidone iodine]    Review of Systems   Review of Systems  Physical Exam Updated Vital Signs BP 136/83   Pulse 82   Temp 98 F (36.7 C) (Oral)   Resp 17   LMP 07/29/2023 (Approximate)   SpO2 99%  Physical Exam Vitals and nursing note reviewed.  Constitutional:      General: She is not in acute distress.    Appearance: She is well-developed.  HENT:     Head: Normocephalic and atraumatic.     Comments: No obvious dental caries on exam, she appears to have a impacted right upper molar tooth however but there is no facial swelling or trismus or drooling or submandibular swelling Eyes:     Conjunctiva/sclera: Conjunctivae normal.  Skin:    General: Skin is warm and dry.  Neurological:     Mental Status: She is alert.     ED Results / Procedures / Treatments   Labs (all labs ordered are listed, but only abnormal results are displayed) Labs Reviewed - No data to display  EKG None  Radiology No results found.  Procedures Procedures    Medications Ordered in ED Medications  oxyCODONE (Oxy IR/ROXICODONE) immediate release tablet 5 mg (has no administration in time range)    ED Course/ Medical Decision Making/ A&P                                 Medical Decision Making Risk Prescription drug management.   [name] is here with right upper dental pain.  Normal vitals.  No fever.  No significant medical history.  She has an impacted molar on the right upper mouth with is likely causing her pain.  Do not see any obvious abscess or major infectious process but she has been on amoxicillin now for 2 days.  She has a follow-up with her dentist on Thursday.  She had tramadol leftover from a knee injury from a few weeks ago but that has not helped.  Per my review of her narcotic database she had a prescription of tramadol filled about 2 weeks ago and had narcotic prescription last year but does not appear to have a consistent pattern of getting narcotic  prescriptions here in the last year.  Overall I do think that she has tried her best to treat herself with ibuprofen and Tylenol without much relief.  I think it is reasonable to give her short course of oxycodone for breakthrough pain but I have encouraged her to take ibuprofen and Tylenol first.  She has follow-up with her dentist in 2 days.  Overall  she is on antibiotics already and appears well.  Discharged in good condition.  This chart was dictated using voice recognition software.  Despite best efforts to proofread,  errors can occur which can change the documentation meaning.         Final Clinical Impression(s) / ED Diagnoses Final diagnoses:  Pain, dental    Rx / DC Orders ED Discharge Orders          Ordered    oxyCODONE (ROXICODONE) 5 MG immediate release tablet  Every 6 hours PRN        08/02/23 1434              Jep Dyas, Madelaine Bhat, DO 08/02/23 1437

## 2023-08-02 NOTE — Progress Notes (Signed)
Virtual Visit via Telephone Note  I connected with [email or web address] on 08/02/23 at 1112am by telephone and verified that I am speaking with the correct person using two identifiers.  I spent 6 minutes on this telehealth encounter  Location: Patient: home Provider: office   I discussed the limitations, risks, security and privacy concerns of performing an evaluation and management service by telephone and the availability of in person appointments. I also discussed with the patient that there may be a patient responsible charge related to this service. The patient expressed understanding and agreed to proceed.   History of Present Illness: Deanna Myers  has a past medical history of ADHD (attention deficit hyperactivity disorder), Asthma, BV (bacterial vaginosis), Chlamydia, GERD (gastroesophageal reflux disease), Gonorrhea, and Hyperemesis.  Patient presents with complaints of dental pain, she was started on antibiotics by her dentist yesterday.  Stated that she has been taking OTC ibuprofen, Advil extra strength and Tylenol for pain. she was at the urgent care this morning, they had offered her Toradol IM but the patient declined.  Her PCP had recently prescribed tramadol for right leg pain about a week ago.  I offered to send in a prescription for ibuprofen 800 mg 3 times daily as needed for dental pain  but the patient declined.    Observations/Objective: Patient alert and oriented x 4.  No sign of distress noted  Assessment and Plan: Pain, dental Continue amoxicillin ordered by the dentist  Patient prescription for ibuprofen 800 mg tablet.     Follow Up Instructions:    I discussed the assessment and treatment plan with the patient. The patient was provided an opportunity to ask questions and all were answered. The patient agreed with the plan and demonstrated an understanding of the instructions.   The patient was advised to call back or seek an in-person evaluation if the symptoms worsen  or if the condition fails to improve as anticipated.

## 2023-08-02 NOTE — ED Triage Notes (Signed)
Pt presents to office for dental pain. Pt states she has two chip teeth. Pt is on Tramadol and Amoxicillin that was started yesterday.

## 2023-08-12 ENCOUNTER — Ambulatory Visit (INDEPENDENT_AMBULATORY_CARE_PROVIDER_SITE_OTHER): Payer: Medicaid Other | Admitting: Clinical

## 2023-08-12 DIAGNOSIS — F419 Anxiety disorder, unspecified: Secondary | ICD-10-CM

## 2023-08-12 DIAGNOSIS — F32A Depression, unspecified: Secondary | ICD-10-CM

## 2023-08-12 NOTE — BH Specialist Note (Unsigned)
Referral for counseling - gave therapist list. Where does she go for psych? - see if they have counseling too  Hasn't received food delivery from [facility] - call D[facility] Pikes

## 2023-08-31 ENCOUNTER — Telehealth: Payer: Self-pay | Admitting: Pharmacy Technician

## 2023-08-31 ENCOUNTER — Other Ambulatory Visit (HOSPITAL_COMMUNITY): Payer: Self-pay

## 2023-08-31 NOTE — Telephone Encounter (Signed)
Pharmacy Patient Advocate Encounter   Received notification from Fax that prior authorization for NURTEC 75MG  is required/requested.   Insurance verification completed.   The patient is insured through [employer] .   Per test claim: Refill too soon. PA is not needed at this time. Medication was filled 11.17.24. Next eligible fill date is 12.10.24.

## 2023-09-27 ENCOUNTER — Other Ambulatory Visit: Payer: Self-pay | Admitting: Neurology

## 2023-11-29 ENCOUNTER — Telehealth: Payer: Self-pay | Admitting: Neurology

## 2023-11-29 NOTE — Telephone Encounter (Signed)
 Pt. Scheduled needs refill of Rx Nurtec PO prior to appt, PA sent but denied due to no appt showing

## 2023-11-30 ENCOUNTER — Ambulatory Visit: Payer: Medicaid Other | Admitting: Neurology

## 2023-11-30 NOTE — Progress Notes (Deleted)
 NEUROLOGY FOLLOW UP OFFICE NOTE  Deanna Myers Myers [id number]  Assessment/Plan:   Migraine with aura, without status migrainosus, intractable   Migraine prevention:  defer for now as frequency has decreased. Migraine rescue:  Stop rizatriptan.  She will try samples of Nurtec.  For nausea, Zofran 4mg  Limit use of pain relievers to no more than 2 days out of week to prevent risk of rebound or medication-overuse headache. Keep headache diary Follow up 6 months.       Subjective:  Deanna Myers Myers is a 33 year old female with ADHD and asthma who follows up for migraines.  UPDATE:  Intensity:  severe *** Duration:  *** with Nurtec Frequency:  4 days a month *** Current NSAIDS:  meloxicam, ibuprofen Current analgesics:  no Current triptans: none Current ergotamine:  no Current anti-emetic:  ondansetron 4mg  Current muscle relaxants:  methocarbamol Current anti-anxiolytic:  no Current sleep aide:  no Current Antihypertensive medications:  no Current Antidepressant medications:  no Current Anticonvulsant medications:  gabapentin 100mg  TID Current anti-CGRP:  Nurtec Current Vitamins/Herbal/Supplements:  no Current Antihistamines/Decongestants:  hydroxyzine Other therapy:  cool compresses, baths with Epson salts Other medication:  no  Caffeine:  Occasional soda or coffee Alcohol:  occasional Smoker:  no Diet:  Hydrates, Gatorade Exercise:  yes Depression:  no; Anxiety:  yes Other pain:  no Sleep hygiene:  good  HISTORY:  Feels headaches getting worse. Onset:  2015, following her first pregnancy.  Infrequent until this past year. Location:  Varies (frontal, parietal, occipital) Quality:  Undulating, pulsating, stabbing Initial intensity:  severe.  She denies new headache, thunderclap headache or severe headache that wakes her from sleep. Aura:  Monocular vision loss/blurred vision in either eye Prodrome: no Postdrome:  no Associated symptoms:  Nausea, photophobia,  phonophobia, sometimes vomiting, hypersensitivity, head/neck/upper torso aching  She denies associated unilateral numbness or weakness. Initial Duration:  4 days (3 days gradual buildup) Initial Frequency:  1 times a month Initial Frequency of abortive medication: as needed Triggers/aggravating factors:  Unknown Relieving factors:  Laying down in dark Activity:  aggravates   CT head on 04/25/2012 was normal.      Past NSAIDS:  Ibuprofen, naproxen Past analgesics:  Excedrin, Tylenol, Extra-strength Tylenol Past abortive triptans:  sumatriptan 100mg , rizatriptan Past muscle relaxants:  Robaxin, tizanidine Past anti-emetic:  Zofran 4mg  Past antihypertensive medications:  no Past antidepressant medications:  no Past anticonvulsant medications:  topiramate Past vitamins/Herbal/Supplements:  no Past antihistamines/decongestants:  no Other past therapies:  no      Family history of headache:  Mother had migraines  PAST MEDICAL HISTORY: Past Medical History:  Diagnosis Date   ADHD (attention deficit hyperactivity disorder)    since childhood   Asthma    inhaler last used prior to pregnancy   BV (bacterial vaginosis)    Chlamydia    GERD (gastroesophageal reflux disease)    Gonorrhea    Hyperemesis    G3    MEDICATIONS: Current Outpatient Medications on File Prior to Visit  Medication Sig Dispense Refill   acetaminophen (TYLENOL) 500 MG tablet Take 1,000 mg by mouth every 6 (six) hours as needed for moderate pain or headache.     amoxicillin (AMOXIL) 500 MG capsule Take 500 mg by mouth every 8 (eight) hours.     benzonatate (TESSALON) 100 MG capsule Take 1 capsule (100 mg total) by mouth 2 (two) times daily as needed for cough. 20 capsule 0   dicyclomine (BENTYL) 10 MG capsule Take 1 capsule (10  mg total) by mouth 4 (four) times daily as needed for spasms. 120 capsule 2   furosemide (LASIX) 20 MG tablet Take 1 tablet (20 mg total) by mouth daily for 5 days. 5 tablet 0    gabapentin (NEURONTIN) 100 MG capsule Take 1 capsule (100 mg total) by mouth 3 (three) times daily. 90 capsule 3   ibuprofen (ADVIL) 600 MG tablet Take 1 tablet (600 mg total) by mouth every 6 (six) hours as needed for moderate pain, fever or cramping. Take with food 30 tablet 0   levocetirizine (XYZAL ALLERGY 24HR) 5 MG tablet Take 1 tablet (5 mg total) by mouth every evening. 90 tablet 1   linaclotide (LINZESS) 72 MCG capsule Take 1 capsule (72 mcg total) by mouth daily before breakfast. 30 capsule 0   meloxicam (MOBIC) 15 MG tablet Take 1 tablet (15 mg total) by mouth daily. 30 tablet 0   methocarbamol (ROBAXIN) 500 MG tablet Take 1 tablet (500 mg total) by mouth every 6 (six) hours as needed for muscle spasms (pelvic cramps). 20 tablet 2   ondansetron (ZOFRAN) 4 MG tablet Take 1 tablet (4 mg total) by mouth every 8 (eight) hours as needed for nausea or vomiting. 20 tablet 5   oxyCODONE (ROXICODONE) 5 MG immediate release tablet Take 1 tablet (5 mg total) by mouth every 6 (six) hours as needed for up to 8 doses. 8 tablet 0   phenazopyridine (PYRIDIUM) 200 MG tablet Take 1 tablet (200 mg total) by mouth 3 (three) times daily. 6 tablet 0   Rimegepant Sulfate (NURTEC PO) Take by mouth.     SUMAtriptan (IMITREX) 20 MG/ACT nasal spray Place 1 spray (20 mg total) into the nose as needed for migraine or headache. May repeat in 2 hours if headache persists or recurs.  Maximum doses in 24 hours. 6 each 5   [DISCONTINUED] albuterol (PROVENTIL HFA;VENTOLIN HFA) 108 (90 BASE) MCG/ACT inhaler Inhale 1-2 puffs into the lungs every 6 (six) hours as needed for wheezing or shortness of breath.     [DISCONTINUED] omeprazole (PRILOSEC) 20 MG capsule Take 1 capsule (20 mg total) by mouth 2 (two) times daily. 60 capsule 1   [DISCONTINUED] SUMAtriptan (IMITREX) 100 MG tablet Take 1 tablet earliest onset of migraine.  May repeat once in 2 hours if headache persists or recurs.  Do not exceed 2 tablets in 24 hours 10 tablet  2   No current facility-administered medications on file prior to visit.     ALLERGIES: Allergies  Allergen Reactions   Shellfish Allergy Anaphylaxis   Adhesive [Tape] Swelling and Other (See Comments)    Reaction:  Bruising    Latex Hives   Betadine [Povidone Iodine] Rash    FAMILY HISTORY: Family History  Problem Relation Age of Onset   Hypertension Father    Hypertension Mother    Kidney disease Mother    Cervical cancer Paternal Aunt    Congestive Heart Failure Maternal Grandmother    Breast cancer Paternal Grandfather       Objective:  **** General: No acute distress.  Patient appears well-groomed.   Head:  Normocephalic/atraumatic Neck:  Supple.  No paraspinal tenderness.  Full range of motion. Heart:  Regular rate and rhythm. Neuro:  Alert and oriented.  Speech fluent and not dysarthric.  Language intact.  CN II-XII intact.  Bulk and tone normal.  Muscle strength 5/5 throughout.  Deep tendon reflexes 2+ throughout.  Gait normal.  Romberg negative.    Shon Millet, DO  CC: Angus Seller, NP

## 2023-12-01 ENCOUNTER — Ambulatory Visit: Payer: Medicaid Other | Admitting: Neurology

## 2023-12-01 ENCOUNTER — Encounter: Payer: Self-pay | Admitting: Neurology

## 2023-12-01 VITALS — BP 100/60 | Ht 67.0 in | Wt 251.0 lb

## 2023-12-01 DIAGNOSIS — G43009 Migraine without aura, not intractable, without status migrainosus: Secondary | ICD-10-CM

## 2023-12-01 MED ORDER — ONDANSETRON HCL 8 MG PO TABS: 8.0000 mg | ORAL_TABLET | Freq: Three times a day (TID) | ORAL | 5 refills | Status: DC | PRN

## 2023-12-01 MED ORDER — AIMOVIG 140 MG/ML ~~LOC~~ SOAJ: 140.0000 mg | SUBCUTANEOUS | 11 refills | Status: DC

## 2023-12-01 MED ORDER — SUMATRIPTAN 20 MG/ACT NA SOLN: 20.0000 mg | NASAL | 5 refills | Status: DC | PRN

## 2023-12-01 MED ORDER — NURTEC 75 MG PO TBDP: 1.0000 | ORAL_TABLET | Freq: Every day | ORAL | 5 refills | Status: DC | PRN

## 2023-12-01 NOTE — Telephone Encounter (Signed)
 Pending patients appointment 12/01/2023 at 10:30am.

## 2023-12-01 NOTE — Progress Notes (Signed)
 NEUROLOGY FOLLOW UP OFFICE NOTE  Deanna Myers [id number]  Assessment/Plan:   Migraine with aura, without status migrainosus, intractable   She has had an increase in frequency, probably due to recent stressors.   Migraine prevention:  Plan to start Aimovig 140mg  every 30 days.  She has already tried topiramate and cannot take a beta blocker due to baseline low blood pressure. Migraine rescue:  Continue Nurtec and sumatriptan 20mg  NS, but she may try taking the sumatriptan as first line to see if she gets quicker relief. Increase Zofran to 8mg . Limit use of pain relievers to no more than 2 days out of week to prevent risk of rebound or medication-overuse headache. Keep headache diary Follow up 6 months.       Subjective:  Deanna Myers is a 33 year old female with ADHD and asthma who follows up for migraines.  UPDATE: Notes increased frequency since the new year.  Increased stress. Nausea is worse.   Intensity:  severe  Duration:  usually 4 hours to rest of day with Nurtec and nap, but if needs to take sumatriptan NS with nap for 4-6 hours.    Frequency:  5 days a month (previously 4 a month) Current NSAIDS:  meloxicam, ibuprofen Current analgesics:  no Current triptans: sumatriptan 20mg  NS Current ergotamine:  no Current anti-emetic:  ondansetron 4mg  Current muscle relaxants:  none Current anti-anxiolytic:  no Current sleep aide:  no Current Antihypertensive medications:  no Current Antidepressant medications:  no Current Anticonvulsant medications:  none Current anti-CGRP:  Nurtec Current Vitamins/Herbal/Supplements: black seed oil Current Antihistamines/Decongestants:  none Other therapy:  cool compresses, baths with Epson salts Other medication:  no  Caffeine:  3 Energy drinks daily.  Occasional soda or coffee Alcohol:  occasional Smoker:  no Diet:  Hydrates with water.  Cut out Gatorade.   Exercise:  yes Depression:  no; Anxiety:  yes Other pain:   no Sleep hygiene:  Poor since recent stress.    HISTORY:  Feels headaches getting worse. Onset:  2015, following her first pregnancy.  Infrequent until this past year. Location:  Varies (frontal, parietal, occipital) Quality:  Undulating, pulsating, stabbing Initial intensity:  severe.  She denies new headache, thunderclap headache or severe headache that wakes her from sleep. Aura:  Monocular vision loss/blurred vision in either eye Prodrome: no Postdrome:  no Associated symptoms:  Nausea, photophobia, phonophobia, sometimes vomiting, hypersensitivity, head/neck/upper torso aching  She denies associated unilateral numbness or weakness. Initial Duration:  4 days (3 days gradual buildup) Initial Frequency:  1 times a month Initial Frequency of abortive medication: as needed Triggers/aggravating factors:  Unknown Relieving factors:  Laying down in dark Activity:  aggravates   CT head on 04/25/2012 was normal.      Past NSAIDS:  Ibuprofen, naproxen Past analgesics:  Excedrin, Tylenol, Extra-strength Tylenol Past abortive triptans:  sumatriptan 100mg , rizatriptan Past muscle relaxants:  Robaxin, tizanidine Past anti-emetic:  Zofran 4mg  Past antihypertensive medications:  no Past antidepressant medications:  no Past anticonvulsant medications:  topiramate, gabapentin Past vitamins/Herbal/Supplements:  no Past antihistamines/decongestants:  no Other past therapies:  no      Family history of headache:  Mother had migraines  PAST MEDICAL HISTORY: Past Medical History:  Diagnosis Date   ADHD (attention deficit hyperactivity disorder)    since childhood   Asthma    inhaler last used prior to pregnancy   BV (bacterial vaginosis)    Chlamydia    GERD (gastroesophageal reflux disease)    Gonorrhea  Hyperemesis    G3    MEDICATIONS: Current Outpatient Medications on File Prior to Visit  Medication Sig Dispense Refill   acetaminophen (TYLENOL) 500 MG tablet Take 1,000 mg  by mouth every 6 (six) hours as needed for moderate pain or headache.     amoxicillin (AMOXIL) 500 MG capsule Take 500 mg by mouth every 8 (eight) hours.     benzonatate (TESSALON) 100 MG capsule Take 1 capsule (100 mg total) by mouth 2 (two) times daily as needed for cough. 20 capsule 0   dicyclomine (BENTYL) 10 MG capsule Take 1 capsule (10 mg total) by mouth 4 (four) times daily as needed for spasms. 120 capsule 2   furosemide (LASIX) 20 MG tablet Take 1 tablet (20 mg total) by mouth daily for 5 days. 5 tablet 0   gabapentin (NEURONTIN) 100 MG capsule Take 1 capsule (100 mg total) by mouth 3 (three) times daily. 90 capsule 3   ibuprofen (ADVIL) 600 MG tablet Take 1 tablet (600 mg total) by mouth every 6 (six) hours as needed for moderate pain, fever or cramping. Take with food 30 tablet 0   levocetirizine (XYZAL ALLERGY 24HR) 5 MG tablet Take 1 tablet (5 mg total) by mouth every evening. 90 tablet 1   linaclotide (LINZESS) 72 MCG capsule Take 1 capsule (72 mcg total) by mouth daily before breakfast. 30 capsule 0   meloxicam (MOBIC) 15 MG tablet Take 1 tablet (15 mg total) by mouth daily. 30 tablet 0   methocarbamol (ROBAXIN) 500 MG tablet Take 1 tablet (500 mg total) by mouth every 6 (six) hours as needed for muscle spasms (pelvic cramps). 20 tablet 2   ondansetron (ZOFRAN) 4 MG tablet Take 1 tablet (4 mg total) by mouth every 8 (eight) hours as needed for nausea or vomiting. 20 tablet 5   oxyCODONE (ROXICODONE) 5 MG immediate release tablet Take 1 tablet (5 mg total) by mouth every 6 (six) hours as needed for up to 8 doses. 8 tablet 0   phenazopyridine (PYRIDIUM) 200 MG tablet Take 1 tablet (200 mg total) by mouth 3 (three) times daily. 6 tablet 0   Rimegepant Sulfate (NURTEC PO) Take by mouth.     SUMAtriptan (IMITREX) 20 MG/ACT nasal spray Place 1 spray (20 mg total) into the nose as needed for migraine or headache. May repeat in 2 hours if headache persists or recurs.  Maximum doses in 24  hours. 6 each 5   [DISCONTINUED] albuterol (PROVENTIL HFA;VENTOLIN HFA) 108 (90 BASE) MCG/ACT inhaler Inhale 1-2 puffs into the lungs every 6 (six) hours as needed for wheezing or shortness of breath.     [DISCONTINUED] omeprazole (PRILOSEC) 20 MG capsule Take 1 capsule (20 mg total) by mouth 2 (two) times daily. 60 capsule 1   [DISCONTINUED] SUMAtriptan (IMITREX) 100 MG tablet Take 1 tablet earliest onset of migraine.  May repeat once in 2 hours if headache persists or recurs.  Do not exceed 2 tablets in 24 hours 10 tablet 2   No current facility-administered medications on file prior to visit.     ALLERGIES: Allergies  Allergen Reactions   Shellfish Allergy Anaphylaxis   Adhesive [Tape] Swelling and Other (See Comments)    Reaction:  Bruising    Latex Hives   Betadine [Povidone Iodine] Rash    FAMILY HISTORY: Family History  Problem Relation Age of Onset   Hypertension Father    Hypertension Mother    Kidney disease Mother    Cervical cancer Paternal  Aunt    Congestive Heart Failure Maternal Grandmother    Breast cancer Paternal Grandfather       Objective:  Blood pressure 100/60, height 5\' 7"  (1.702 m), weight 251 lb (113.9 kg). General: No acute distress.  Patient appears well-groomed.   Head:  Normocephalic/atraumatic Neck:  Supple.  No paraspinal tenderness.  Full range of motion. Heart:  Regular rate and rhythm. Neuro:  Alert and oriented.  Speech fluent and not dysarthric.  Language intact.  CN II-XII intact.  Bulk and tone normal.  Muscle strength 5/5 throughout.  Deep tendon reflexes 2+ throughout.  Gait normal.  Romberg negative.    Shon Millet, DO  CC: Angus Seller, NP

## 2023-12-01 NOTE — Patient Instructions (Signed)
  Start Aimovig injection every 30 days.   Try taking sumatriptan spray as first line at earliest onset of headache.  Otherwise, may continue Nurtec Increase ondansetron to 8mg  Limit use of pain relievers to no more than 2 days out of the week.  These medications include acetaminophen, NSAIDs (ibuprofen/Advil/Motrin, naproxen/Aleve, triptans (Imitrex/sumatriptan), Excedrin, and narcotics.  This will help reduce risk of rebound headaches. Be aware of common food triggers Stay adequately hydrated (aim for 64 oz water daily) Keep headache diary Maintain proper stress management Maintain proper sleep hygiene Do not skip meals Consider supplements:  magnesium citrate 400mg  daily, riboflavin 400mg  daily, coenzyme Q10 300mg  daily

## 2023-12-06 ENCOUNTER — Telehealth: Payer: Self-pay

## 2023-12-06 NOTE — Telephone Encounter (Signed)
 PA needed for Nurtec.

## 2023-12-07 ENCOUNTER — Other Ambulatory Visit (HOSPITAL_COMMUNITY): Payer: Self-pay

## 2023-12-07 ENCOUNTER — Telehealth: Payer: Self-pay

## 2023-12-07 NOTE — Telephone Encounter (Signed)
*  [facility]  Pharmacy Patient Advocate Encounter  Received notification from [facility] that Prior Authorization for Nurtec 75MG  dispersible tablets  has been APPROVED from 12/07/2023 to 12/05/2024. Ran test claim, Copay is $4.00. This test claim was processed through [facility]- copay amounts may vary at other pharmacies due to pharmacy/plan contracts, or as the patient moves through the different stages of their insurance plan.   PA #/Case ID/Reference #: [id number]

## 2023-12-07 NOTE — Telephone Encounter (Signed)
 PA request has been Submitted. New Encounter has been or will be created for follow up. For additional info see Pharmacy Prior Auth telephone encounter from 03/05.

## 2023-12-09 ENCOUNTER — Other Ambulatory Visit (HOSPITAL_COMMUNITY): Payer: Self-pay

## 2024-01-02 ENCOUNTER — Encounter: Payer: Self-pay | Admitting: Neurology

## 2024-01-06 ENCOUNTER — Encounter (HOSPITAL_BASED_OUTPATIENT_CLINIC_OR_DEPARTMENT_OTHER): Payer: Self-pay

## 2024-01-06 ENCOUNTER — Emergency Department (HOSPITAL_BASED_OUTPATIENT_CLINIC_OR_DEPARTMENT_OTHER): Admission: EM | Admit: 2024-01-06 | Discharge: 2024-01-06 | Disposition: A | Discharge status: Home / Self Care

## 2024-01-06 ENCOUNTER — Other Ambulatory Visit: Payer: Self-pay

## 2024-01-06 DIAGNOSIS — L02211 Cutaneous abscess of abdominal wall: Secondary | ICD-10-CM | POA: Diagnosis present

## 2024-01-06 DIAGNOSIS — Z91040 Latex allergy status: Secondary | ICD-10-CM | POA: Insufficient documentation

## 2024-01-06 DIAGNOSIS — L0291 Cutaneous abscess, unspecified: Secondary | ICD-10-CM

## 2024-01-06 LAB — CBC
HCT: 36.2 % (ref 36.0–46.0)
Hemoglobin: 12 g/dL (ref 12.0–15.0)
MCH: 28 pg (ref 26.0–34.0)
MCHC: 33.1 g/dL (ref 30.0–36.0)
MCV: 84.4 fL (ref 80.0–100.0)
Platelets: 297 10*3/uL (ref 150–400)
RBC: 4.29 MIL/uL (ref 3.87–5.11)
RDW: 13.9 % (ref 11.5–15.5)
WBC: 6.3 10*3/uL (ref 4.0–10.5)
nRBC: 0 % (ref 0.0–0.2)

## 2024-01-06 LAB — COMPREHENSIVE METABOLIC PANEL WITH GFR
ALT: 16 U/L (ref 0–44)
AST: 20 U/L (ref 15–41)
Albumin: 4.2 g/dL (ref 3.5–5.0)
Alkaline Phosphatase: 45 U/L (ref 38–126)
Anion gap: 6 (ref 5–15)
BUN: 7 mg/dL (ref 6–20)
CO2: 29 mmol/L (ref 22–32)
Calcium: 9 mg/dL (ref 8.9–10.3)
Chloride: 105 mmol/L (ref 98–111)
Creatinine, Ser: 0.62 mg/dL (ref 0.44–1.00)
GFR, Estimated: >60 mL/min (ref >60)
Glucose, Bld: 92 mg/dL (ref 70–99)
Potassium: 3.9 mmol/L (ref 3.5–5.1)
Sodium: 140 mmol/L (ref 135–145)
Total Bilirubin: 0.3 mg/dL (ref 0.0–1.2)
Total Protein: 7.4 g/dL (ref 6.5–8.1)

## 2024-01-06 LAB — URINALYSIS, ROUTINE W REFLEX MICROSCOPIC
Bilirubin Urine: NEGATIVE
Glucose, UA: NEGATIVE mg/dL
Hgb urine dipstick: NEGATIVE
Ketones, ur: NEGATIVE mg/dL
Leukocytes,Ua: NEGATIVE
Nitrite: NEGATIVE
Specific Gravity, Urine: 1.023 (ref 1.005–1.030)
pH: 8 (ref 5.0–8.0)

## 2024-01-06 LAB — PREGNANCY, URINE: Preg Test, Ur: NEGATIVE

## 2024-01-06 LAB — LIPASE, BLOOD: Lipase: 14 U/L (ref 11–51)

## 2024-01-06 MED ORDER — ONDANSETRON 4 MG PO TBDP: 4.0000 mg | ORAL_TABLET | Freq: Three times a day (TID) | ORAL | 0 refills | Status: DC | PRN

## 2024-01-06 MED ORDER — ONDANSETRON 4 MG PO TBDP
4.0000 mg | ORAL_TABLET | Freq: Once | ORAL | Status: AC
  Administered 2024-01-06: 4 mg via ORAL
  Filled 2024-01-06: qty 1

## 2024-01-06 MED ORDER — SULFAMETHOXAZOLE-TRIMETHOPRIM 800-160 MG PO TABS: 1.0000 | ORAL_TABLET | Freq: Two times a day (BID) | ORAL | 0 refills | Status: AC

## 2024-01-06 MED ORDER — PANTOPRAZOLE SODIUM 20 MG PO TBEC
20.0000 mg | DELAYED_RELEASE_TABLET | Freq: Every day | ORAL | 0 refills | Status: DC
Start: 2024-01-06 — End: 2024-01-19

## 2024-01-06 MED ORDER — LIDOCAINE-EPINEPHRINE (PF) 2 %-1:200000 IJ SOLN
10.0000 mL | Freq: Once | INTRAMUSCULAR | Status: AC
  Administered 2024-01-06: 10 mL
  Filled 2024-01-06: qty 20

## 2024-01-06 MED ORDER — ALPRAZOLAM 0.5 MG PO TABS
0.2500 mg | ORAL_TABLET | Freq: Once | ORAL | Status: AC
  Administered 2024-01-06: 0.25 mg via ORAL
  Filled 2024-01-06: qty 1

## 2024-01-06 NOTE — Discharge Instructions (Signed)
 Please take the antibiotics as prescribed to you.  Return immediately for fevers, chills, severe pain, redness spreads or you develop worsening abdominal pain, inability to eat or drink due to nausea and vomiting or any new or worsening symptoms that are concerning to you.

## 2024-01-06 NOTE — ED Triage Notes (Signed)
 Pt presents to ED from home C/O ingrown hair, urinary frequency, nausea and abdominal pain.

## 2024-01-06 NOTE — ED Notes (Addendum)
 Patient reports again that she is unable to urinate. Provided with 2nd ginger ale upon request.

## 2024-01-06 NOTE — ED Notes (Signed)
 Requested patient to provide urine sample. Patient unable to do so at this time. Provided patient with ginger ale drink upon request and Dr. Roxy Cedar permission.

## 2024-01-06 NOTE — ED Provider Notes (Signed)
 [place] EMERGENCY DEPARTMENT AT [facility] Provider Note   CSN: [id number] Arrival date & time: 01/06/24  1308     History  Chief Complaint  Patient presents with   Abscess    Deanna Myers is a 33 y.o. female.  33 year old female presenting emergency department with multiple complaints.  Primarily concerned with ingrown hair in her groin area that has been ongoing for the past several days.  No fevers no chills.  It is painful.  She also notes that she has had nausea and epigastric abdominal pain for the past several months.  Normal bowel movements.  Nauseated currently.  Reports history of H. pylori.  Endorses dysuria as well.        Home Medications Prior to Admission medications   Medication Sig Start Date End Date Taking? Authorizing Provider  ondansetron (ZOFRAN-ODT) 4 MG disintegrating tablet Take 1 tablet (4 mg total) by mouth every 8 (eight) hours as needed for nausea or vomiting. 01/06/24  Yes Coral Spikes, DO  pantoprazole (PROTONIX) 20 MG tablet Take 1 tablet (20 mg total) by mouth daily for 14 days. 01/06/24 01/20/24 Yes Nyrie Sigal, Harmon Dun, DO  sulfamethoxazole-trimethoprim (BACTRIM DS) 800-160 MG tablet Take 1 tablet by mouth 2 (two) times daily for 7 days. 01/06/24 01/13/24 Yes Tyra Gural, Harmon Dun, DO  Erenumab-aooe (AIMOVIG) 140 MG/ML SOAJ Inject 140 mg into the skin every 30 (thirty) days. 12/01/23   [name], DO  ondansetron (ZOFRAN) 8 MG tablet Take 1 tablet (8 mg total) by mouth every 8 (eight) hours as needed for nausea or vomiting. 12/01/23   Everlena Cooper, Adam R, DO  Rimegepant Sulfate (NURTEC) 75 MG TBDP Take 1 tablet (75 mg total) by mouth daily as needed. 12/01/23   [name], DO  SUMAtriptan (IMITREX) 20 MG/ACT nasal spray Place 1 spray (20 mg total) into the nose as needed for migraine or headache. May repeat in 2 hours if headache persists or recurs.  Maximum doses in 24 hours. 12/01/23   Everlena Cooper, Adam R, DO  albuterol (PROVENTIL HFA;VENTOLIN HFA) 108  (90 BASE) MCG/ACT inhaler Inhale 1-2 puffs into the lungs every 6 (six) hours as needed for wheezing or shortness of breath.  05/21/20  [provider]  omeprazole (PRILOSEC) 20 MG capsule Take 1 capsule (20 mg total) by mouth 2 (two) times daily. 07/03/17 09/18/19  [name], MD  SUMAtriptan (IMITREX) 100 MG tablet Take 1 tablet earliest onset of migraine.  May repeat once in 2 hours if headache persists or recurs.  Do not exceed 2 tablets in 24 hours 02/10/18 05/21/20  [name], DO      Allergies    Shellfish allergy, Adhesive [tape], Latex, and Betadine [povidone iodine]    Review of Systems   Review of Systems  Physical Exam Updated Vital Signs BP 129/79 (BP Location: Right Arm)   Pulse 96   Temp 98 F (36.7 C) (Oral)   Resp 16   Ht 5\' 6"  (1.676 m)   Wt 111.6 kg   SpO2 100%   BMI 39.71 kg/m  Physical Exam Vitals and nursing note reviewed. Exam conducted with a chaperone present.  Constitutional:      General: She is not in acute distress.    Appearance: She is not toxic-appearing.  HENT:     Head: Normocephalic.     Nose: Nose normal.     Mouth/Throat:     Mouth: Mucous membranes are moist.  Cardiovascular:     Rate  and Rhythm: Normal rate and regular rhythm.  Pulmonary:     Effort: Pulmonary effort is normal.     Breath sounds: Normal breath sounds.  Abdominal:     General: Abdomen is flat. There is no distension.     Tenderness: There is no abdominal tenderness. There is no guarding or rebound.  Genitourinary:    Comments: Small marble sized abscess to the right pelvis.  Minor surrounding erythema.  Indurated and fluctuant Musculoskeletal:        General: Normal range of motion.  Skin:    General: Skin is warm and dry.     Capillary Refill: Capillary refill takes less than 2 seconds.  Neurological:     Mental Status: She is alert and oriented to person, place, and time.  Psychiatric:        Mood and Affect: Mood normal.        Behavior: Behavior  normal.     ED Results / Procedures / Treatments   Labs (all labs ordered are listed, but only abnormal results are displayed) Labs Reviewed  URINALYSIS, ROUTINE W REFLEX MICROSCOPIC - Abnormal; Notable for the following components:      Result Value   Protein, ur TRACE (*)    All other components within normal limits  CBC  COMPREHENSIVE METABOLIC PANEL WITH GFR  LIPASE, BLOOD  PREGNANCY, URINE    EKG None  Radiology No results found.  Procedures .Incision and Drainage  Date/Time: 01/06/2024 10:44 AM  Performed by: Coral Spikes, DO Authorized by: Coral Spikes, DO   Consent:    Consent obtained:  Verbal   Consent given by:  Patient   Risks discussed:  Bleeding, incomplete drainage, pain and infection   Alternatives discussed:  No treatment and delayed treatment Universal protocol:    Procedure explained and questions answered to patient or proxy's satisfaction: yes     Patient identity confirmed:  Verbally with patient Location:    Type:  Abscess   Size:  3   Location:  Trunk   Trunk location:  Abdomen Pre-procedure details:    Skin preparation:  Chlorhexidine Sedation:    Sedation type:  None Anesthesia:    Anesthesia method:  Local infiltration   Local anesthetic:  Lidocaine 2% WITH epi Procedure type:    Complexity:  Simple Procedure details:    Ultrasound guidance: no     Needle aspiration: no     Incision types:  Stab incision   Incision depth:  Dermal   Wound management:  Probed and deloculated   Drainage:  Purulent   Drainage amount:  Moderate   Wound treatment:  Wound left open   Packing materials:  None Post-procedure details:    Procedure completion:  Tolerated     Medications Ordered in ED Medications  lidocaine-EPINEPHrine (XYLOCAINE W/EPI) 2 %-1:200000 (PF) injection 10 mL (10 mLs Infiltration Given 01/06/24 1022)  ondansetron (ZOFRAN-ODT) disintegrating tablet 4 mg (4 mg Oral Given 01/06/24 0936)  ALPRAZolam Prudy Feeler) tablet 0.25 mg  (0.25 mg Oral Given 01/06/24 1022)    ED Course/ Medical Decision Making/ A&P Clinical Course as of 01/06/24 1045  Fri Jan 06, 2024  1011 Lab work reassuring.  No leukocytosis to suggest stomach infection.  No abnormalities in her CMP or lipase to suggest acute intra-abdominal pathology.  Pregnancy test is negative.  Ectopic pregnancy unlikely.  UA not consistent with UTI.  Went to perform I&D, quite anxious and requesting anxiolysis.  Will give low-dose of Xanax.  Discussed she  will not be able to drive and she stated her husband will pick her up [TY]  1044 I&D performed.  See procedure note.  With reassuring workup and vitals.  Patient stable for discharge. [TY]    Clinical Course User Index [TY] Coral Spikes, DO                                 Medical Decision Making This is a 33 year old female presenting emergency department with multiple complaints.  She is afebrile nontachycardic hemodynamically stable.  Has what appears to be abscess on her pelvis.  Will need to be drained.  See procedure note.  Also with vague complaints of generalized abdominal pain ongoing for the past several months and epigastrium with nausea.  Self-reported history of H. pylori.  Amount and/or Complexity of Data Reviewed External Data Reviewed:     Details: CT abdomen pelvis last year normal Labs: ordered. Decision-making details documented in ED Course. Radiology:     Details: Consider repeat CT scan, however reassuring vitals, and benign abdominal exam with symptoms seemingly subacute/chronic. ECG/medicine tests:     Details: Considered, however normal heart rate, not having palpitations or chest pain.  Risk Prescription drug management. Decision regarding hospitalization. Diagnosis or treatment significantly limited by social determinants of health. Risk Details: Reports she was unable to get into her primary doctor until May.         Final Clinical Impression(s) / ED Diagnoses Final  diagnoses:  Abscess    Rx / DC Orders ED Discharge Orders          Ordered    pantoprazole (PROTONIX) 20 MG tablet  Daily        01/06/24 1013    sulfamethoxazole-trimethoprim (BACTRIM DS) 800-160 MG tablet  2 times daily        01/06/24 1013    ondansetron (ZOFRAN-ODT) 4 MG disintegrating tablet  Every 8 hours PRN        01/06/24 1013              Omero Kowal, Harmon Dun, DO 01/06/24 1045

## 2024-01-06 NOTE — ED Notes (Signed)
 Assisted Dr. Maple Hudson with I&D. Patient tolerated well. Dressed wound with sterile 4x4. Given mesh underwear and feminine pad for additional drainage. Patient calling driver.

## 2024-01-13 ENCOUNTER — Telehealth: Payer: Self-pay | Admitting: Nurse Practitioner

## 2024-01-13 ENCOUNTER — Telehealth: Payer: Self-pay

## 2024-01-13 NOTE — Telephone Encounter (Signed)
 Copied from CRM [id number]. Topic: General - Call Back - No Documentation >> Jan 13, 2024  2:30 PM DeAngela L wrote: Reason for CRM: Patient returning call for [place] H. About her transfer of care message No answer on the CAL Line please call the Patient back at [phone] (M)

## 2024-01-13 NOTE — Transitions of Care (Post Inpatient/ED Visit) (Signed)
   01/13/2024  Name: Deanna Myers MRN: [id number] DOB: Oct 03, 1991  Today's TOC FU Call Status: Today's TOC FU Call Status:: Unsuccessful Call (1st Attempt) Unsuccessful Call (1st Attempt) Date: 01/13/24  Attempted to reach the patient regarding the most recent Inpatient/ED visit.  Follow Up Plan: Additional outreach attempts will be made to reach the patient to complete the Transitions of Care (Post Inpatient/ED visit) call.   Signature Renelda Loma RMA

## 2024-01-13 NOTE — Telephone Encounter (Signed)
 Will call Monday for TOC follow up appt. KH

## 2024-01-17 ENCOUNTER — Encounter: Payer: Self-pay | Admitting: Family Medicine

## 2024-01-17 ENCOUNTER — Encounter (HOSPITAL_COMMUNITY): Payer: Self-pay | Admitting: *Deleted

## 2024-01-17 ENCOUNTER — Inpatient Hospital Stay (HOSPITAL_COMMUNITY)

## 2024-01-17 ENCOUNTER — Inpatient Hospital Stay (HOSPITAL_COMMUNITY)
Admission: AD | Admit: 2024-01-17 | Discharge: 2024-01-17 | Discharge status: Against Medical Advice | Attending: Obstetrics & Gynecology | Admitting: Obstetrics & Gynecology

## 2024-01-17 DIAGNOSIS — O3680X0 Pregnancy with inconclusive fetal viability, not applicable or unspecified: Secondary | ICD-10-CM | POA: Diagnosis not present

## 2024-01-17 DIAGNOSIS — O26891 Other specified pregnancy related conditions, first trimester: Secondary | ICD-10-CM | POA: Insufficient documentation

## 2024-01-17 DIAGNOSIS — Z5329 Procedure and treatment not carried out because of patient's decision for other reasons: Secondary | ICD-10-CM

## 2024-01-17 DIAGNOSIS — Z3A01 Less than 8 weeks gestation of pregnancy: Secondary | ICD-10-CM | POA: Insufficient documentation

## 2024-01-17 DIAGNOSIS — Z3490 Encounter for supervision of normal pregnancy, unspecified, unspecified trimester: Secondary | ICD-10-CM

## 2024-01-17 DIAGNOSIS — R109 Unspecified abdominal pain: Secondary | ICD-10-CM | POA: Diagnosis not present

## 2024-01-17 DIAGNOSIS — Z5321 Procedure and treatment not carried out due to patient leaving prior to being seen by health care provider: Secondary | ICD-10-CM | POA: Insufficient documentation

## 2024-01-17 LAB — HCG, QUANTITATIVE, PREGNANCY: hCG, Beta Chain, Quant, S: 207 m[IU]/mL — ABNORMAL HIGH (ref <5)

## 2024-01-17 LAB — URINALYSIS, ROUTINE W REFLEX MICROSCOPIC
Bilirubin Urine: NEGATIVE
Glucose, UA: NEGATIVE mg/dL
Hgb urine dipstick: NEGATIVE
Ketones, ur: NEGATIVE mg/dL
Leukocytes,Ua: NEGATIVE
Nitrite: NEGATIVE
Protein, ur: NEGATIVE mg/dL
Specific Gravity, Urine: 1.017 (ref 1.005–1.030)
pH: 7 (ref 5.0–8.0)

## 2024-01-17 LAB — WET PREP, GENITAL
Sperm: NONE SEEN
Trich, Wet Prep: NONE SEEN
WBC, Wet Prep HPF POC: <10 (ref <10)
Yeast Wet Prep HPF POC: NONE SEEN

## 2024-01-17 LAB — COMPREHENSIVE METABOLIC PANEL WITH GFR
ALT: 18 U/L (ref 0–44)
AST: 20 U/L (ref 15–41)
Albumin: 3.7 g/dL (ref 3.5–5.0)
Alkaline Phosphatase: 47 U/L (ref 38–126)
Anion gap: 8 (ref 5–15)
BUN: <5 mg/dL — ABNORMAL LOW (ref 6–20)
CO2: 27 mmol/L (ref 22–32)
Calcium: 9 mg/dL (ref 8.9–10.3)
Chloride: 104 mmol/L (ref 98–111)
Creatinine, Ser: 0.61 mg/dL (ref 0.44–1.00)
GFR, Estimated: >60 mL/min (ref >60)
Glucose, Bld: 97 mg/dL (ref 70–99)
Potassium: 3.8 mmol/L (ref 3.5–5.1)
Sodium: 139 mmol/L (ref 135–145)
Total Bilirubin: 0.5 mg/dL (ref 0.0–1.2)
Total Protein: 7.4 g/dL (ref 6.5–8.1)

## 2024-01-17 LAB — POCT PREGNANCY, URINE: Preg Test, Ur: POSITIVE — AB

## 2024-01-17 LAB — CBC
HCT: 36.7 % (ref 36.0–46.0)
Hemoglobin: 12.3 g/dL (ref 12.0–15.0)
MCH: 27.8 pg (ref 26.0–34.0)
MCHC: 33.5 g/dL (ref 30.0–36.0)
MCV: 83 fL (ref 80.0–100.0)
Platelets: 367 10*3/uL (ref 150–400)
RBC: 4.42 MIL/uL (ref 3.87–5.11)
RDW: 13.8 % (ref 11.5–15.5)
WBC: 7.4 10*3/uL (ref 4.0–10.5)
nRBC: 0 % (ref 0.0–0.2)

## 2024-01-17 MED ORDER — ONDANSETRON 4 MG PO TBDP
8.0000 mg | ORAL_TABLET | Freq: Once | ORAL | Status: AC
  Administered 2024-01-17: 8 mg via ORAL
  Filled 2024-01-17: qty 2

## 2024-01-17 NOTE — MAU Provider Note (Signed)
 History    CSN: [id number] Arrival date and time: 01/17/24 1331    Chief Complaint  Patient presents with   Abdominal Pain   Breast Pain   Vaginal Discharge   Possible Pregnancy   Abdominal Pain  Vaginal Discharge The patient's primary symptoms include vaginal discharge. Associated symptoms include abdominal pain.  Possible Pregnancy Associated symptoms include abdominal pain.    Patient is 33 y.o. [id number] [redacted]w[redacted]d here with complaints of abdominal pain- described as cramping, described as mild. Breast are tender.  Currently on ABX for a UT/abscess.    OB History     Gravida  5   Para  2   Term  2   Preterm      AB  1   Living  2      SAB  1   IAB      Ectopic      Multiple  0   Live Births  2           Past Medical History:  Diagnosis Date   ADHD (attention deficit hyperactivity disorder)    since childhood   Asthma    inhaler last used prior to pregnancy   BV (bacterial vaginosis)    Chlamydia    GERD (gastroesophageal reflux disease)    Gonorrhea    Hyperemesis    G3    Past Surgical History:  Procedure Laterality Date   CESAREAN SECTION N/A 12/21/2015   Procedure: CESAREAN SECTION;  Surgeon: [name], MD;  Location: [facility];  Service: Obstetrics;  Laterality: N/A;   LAPAROSCOPIC APPENDECTOMY N/A 12/21/2021   Procedure: APPENDECTOMY LAPAROSCOPIC;  Surgeon: Fritzi Mandes, MD;  Location: [facility] OR;  Service: General;  Laterality: N/A;    Family History  Problem Relation Age of Onset   Hypertension Father    Hypertension Mother    Kidney disease Mother    Cervical cancer Paternal Aunt    Congestive Heart Failure Maternal Grandmother    Breast cancer Paternal Grandfather     Social History   Tobacco Use   Smoking status: Never   Smokeless tobacco: Never  Vaping Use   Vaping status: Never Used  Substance Use Topics   Alcohol use: Yes    Comment: social   Drug use: No    Allergies:  Allergies  Allergen Reactions    Shellfish Allergy Anaphylaxis   Adhesive [Tape] Swelling and Other (See Comments)    Reaction:  Bruising    Latex Hives   Betadine [Povidone Iodine] Rash    No medications prior to admission.    Review of Systems  Gastrointestinal:  Positive for abdominal pain.  Genitourinary:  Positive for vaginal discharge.   Physical Exam   Blood pressure 131/65, pulse 79, temperature 99.3 F (37.4 C), temperature source Oral, resp. rate 18, height 5\' 6"  (1.676 m), weight 110.5 kg, last menstrual period 12/19/2023, SpO2 100%.  Patient left AMA after her [name]   MAU Course  Procedures  MDM: high  This patient presents to the ED for concern of   Chief Complaint  Patient presents with   Abdominal Pain   Breast Pain   Vaginal Discharge   Possible Pregnancy     These complains involves an extensive number of treatment options, and is a complaint that carries with it a high risk of complications and morbidity.  The differential diagnosis for  1.abdominal pain in early pregnancy INCLUDES threatened miscarriage, ectopic pregnancy (unless IUP confirmed), pelvic infection, urinary tract infection or normal  variant ligament related discomfort with live IUP.  Rare but possible DDX include appendicitis. Pregnancy of unknown location    Results for orders placed or performed during the hospital encounter of 01/17/24 (from the past 24 hours)  Pregnancy, urine POC     Status: Abnormal   Collection Time: 01/17/24  2:13 PM  Result Value Ref Range   Preg Test, Ur POSITIVE (A) NEGATIVE  Urinalysis, Routine w reflex microscopic -Urine, Clean Catch     Status: Abnormal   Collection Time: 01/17/24  2:14 PM  Result Value Ref Range   Color, Urine YELLOW YELLOW   APPearance HAZY (A) CLEAR   Specific Gravity, Urine 1.017 1.005 - 1.030   pH 7.0 5.0 - 8.0   Glucose, UA NEGATIVE NEGATIVE mg/dL   Hgb urine dipstick NEGATIVE NEGATIVE   Bilirubin Urine NEGATIVE NEGATIVE   Ketones, ur NEGATIVE NEGATIVE mg/dL    Protein, ur NEGATIVE NEGATIVE mg/dL   Nitrite NEGATIVE NEGATIVE   Leukocytes,Ua NEGATIVE NEGATIVE   RBC / HPF 0-5 0 - 5 RBC/hpf   WBC, UA 0-5 0 - 5 WBC/hpf   Bacteria, UA RARE (A) NONE SEEN   Squamous Epithelial / HPF 0-5 0 - 5 /HPF   Mucus PRESENT   Wet prep, genital     Status: Abnormal   Collection Time: 01/17/24  2:14 PM   Specimen: PATH Cytology Cervicovaginal Ancillary Only  Result Value Ref Range   Yeast Wet Prep HPF POC NONE SEEN NONE SEEN   Trich, Wet Prep NONE SEEN NONE SEEN   Clue Cells Wet Prep HPF POC PRESENT (A) NONE SEEN   WBC, Wet Prep HPF POC <10 <10   Sperm NONE SEEN   CBC     Status: None   Collection Time: 01/17/24  3:18 PM  Result Value Ref Range   WBC 7.4 4.0 - 10.5 K/uL   RBC 4.42 3.87 - 5.11 MIL/uL   Hemoglobin 12.3 12.0 - 15.0 g/dL   HCT 16.1 09.6 - 04.5 %   MCV 83.0 80.0 - 100.0 fL   MCH 27.8 26.0 - 34.0 pg   MCHC 33.5 30.0 - 36.0 g/dL   RDW 40.9 81.1 - 91.4 %   Platelets 367 150 - 400 K/uL   nRBC 0.0 0.0 - 0.2 %  ABO/Rh     Status: None   Collection Time: 01/17/24  3:18 PM  Result Value Ref Range   ABO/RH(D)      O POS Performed at [facility], [address] [place], [place] [address]   hCG, quantitative, pregnancy     Status: Abnormal   Collection Time: 01/17/24  3:18 PM  Result Value Ref Range   hCG, Beta Chain, Quant, S 207 (H) <5 mIU/mL  Comprehensive metabolic panel     Status: Abnormal   Collection Time: 01/17/24  3:18 PM  Result Value Ref Range   Sodium 139 135 - 145 mmol/L   Potassium 3.8 3.5 - 5.1 mmol/L   Chloride 104 98 - 111 mmol/L   CO2 27 22 - 32 mmol/L   Glucose, Bld 97 70 - 99 mg/dL   BUN <5 (L) 6 - 20 mg/dL   Creatinine, Ser 6.21 0.44 - 1.00 mg/dL   Calcium 9.0 8.9 - 30.8 mg/dL   Total Protein 7.4 6.5 - 8.1 g/dL   Albumin 3.7 3.5 - 5.0 g/dL   AST 20 15 - 41 U/L   ALT 18 0 - 44 U/L   Alkaline Phosphatase 47 38 -  126 U/L   Total Bilirubin 0.5 0.0 - 1.2 mg/dL   GFR, Estimated >16 >10 mL/min   Anion gap 8 5 -  15   No results found.   6:08 PM Patient had left AMA. Called patient -- no answer.  Mychart message sent.   Assessment and Plan   1. Pregnancy of unknown anatomic location   2. Early stage of pregnancy   3. Left against medical advice    - Patient left AMA after her US  - Attempted to call results from [facility] - no answer. MyChart message sent as well.  - Recommended follow up in 48 hours to assure rising   Deanna Myers 01/17/2024, 6:08 PM

## 2024-01-17 NOTE — MAU Note (Signed)
 Deanna Myers is a 33 y.o. at Unknown here in MAU reporting: extreme breast tenderness, nausea, cramping and discomfort.  Discharge has been off too, smells weird. No bleeding.  Currently on antibiotics for UTI and an abscess. +HPT today.  LMP: 3/18 Onset of complaint: 2 wks ago Pain score: abd-mild. Breast mod Vitals:   01/17/24 1356  BP: 131/65  Pulse: 79  Resp: 18  Temp: 99.3 F (37.4 C)  SpO2: 100%     Lab orders placed from triage:  UPT/ UA , vag swabs collected

## 2024-01-18 ENCOUNTER — Ambulatory Visit (INDEPENDENT_AMBULATORY_CARE_PROVIDER_SITE_OTHER): Admitting: Nurse Practitioner

## 2024-01-18 ENCOUNTER — Encounter: Payer: Self-pay | Admitting: Nurse Practitioner

## 2024-01-18 VITALS — BP 117/61 | HR 87 | Temp 98.1°F | Wt 243.2 lb

## 2024-01-18 DIAGNOSIS — N760 Acute vaginitis: Secondary | ICD-10-CM

## 2024-01-18 DIAGNOSIS — B9689 Other specified bacterial agents as the cause of diseases classified elsewhere: Secondary | ICD-10-CM | POA: Diagnosis not present

## 2024-01-18 LAB — ABO/RH: ABO/RH(D): O POS

## 2024-01-18 LAB — GC/CHLAMYDIA PROBE AMP (~~LOC~~) NOT AT ARMC
Chlamydia: NEGATIVE
Comment: NEGATIVE
Comment: NORMAL
Neisseria Gonorrhea: NEGATIVE

## 2024-01-18 MED ORDER — METRONIDAZOLE 1 % EX GEL: Freq: Every day | CUTANEOUS | 0 refills | Status: DC

## 2024-01-18 NOTE — Patient Instructions (Signed)
 1. BV (bacterial vaginosis) (Primary)  - metroNIDAZOLE (METROGEL) 1 % gel; Apply topically daily.  Dispense: 45 g; Refill: 0

## 2024-01-18 NOTE — Progress Notes (Signed)
 Subjective   Patient ID: Deanna Myers, female    DOB: 06-24-1991, 33 y.o.   MRN: [id number]  Chief Complaint  Patient presents with   Hospitalization Follow-up    Referring provider: Ivonne Andrew, NP  Deanna Myers is a 33 y.o. female with Past Medical History: No date: ADHD (attention deficit hyperactivity disorder)     Comment:  since childhood No date: Asthma     Comment:  inhaler last used prior to pregnancy No date: BV (bacterial vaginosis) No date: Chlamydia No date: GERD (gastroesophageal reflux disease) No date: Gonorrhea No date: Hyperemesis     Comment:  G3  HPI  Patient presents today for hospital follow-up.  She was recently seen in the ED and found out that she was [redacted] weeks pregnant.  She does see OB/GYN tomorrow.  She was also seen in the ED previously for abscess to vaginal region.  The area is well-healing.  She did complete 2 rounds of antibiotics.  She did test positive for BV in the ED yesterday.  We will order MetroGel today. Denies f/c/s, n/v/d, hemoptysis, PND, leg swelling Denies chest pain or edema         Allergies  Allergen Reactions   Shellfish Allergy Anaphylaxis   Adhesive [Tape] Swelling and Other (See Comments)    Reaction:  Bruising    Latex Hives   Betadine [Povidone Iodine] Rash    Immunization History  Administered Date(s) Administered   Pneumococcal Polysaccharide-23 01/17/2014   Tdap 01/17/2014, 10/08/2015    Tobacco History: Social History   Tobacco Use  Smoking Status Never  Smokeless Tobacco Never   Counseling given: Not Answered   Outpatient Encounter Medications as of 01/18/2024  Medication Sig   Erenumab-aooe (AIMOVIG) 140 MG/ML SOAJ Inject 140 mg into the skin every 30 (thirty) days.   metroNIDAZOLE (METROGEL) 1 % gel Apply topically daily.   ondansetron (ZOFRAN) 8 MG tablet Take 1 tablet (8 mg total) by mouth every 8 (eight) hours as needed for nausea or vomiting.   ondansetron (ZOFRAN-ODT) 4  MG disintegrating tablet Take 1 tablet (4 mg total) by mouth every 8 (eight) hours as needed for nausea or vomiting.   pantoprazole (PROTONIX) 20 MG tablet Take 1 tablet (20 mg total) by mouth daily for 14 days.   Rimegepant Sulfate (NURTEC) 75 MG TBDP Take 1 tablet (75 mg total) by mouth daily as needed.   SUMAtriptan (IMITREX) 20 MG/ACT nasal spray Place 1 spray (20 mg total) into the nose as needed for migraine or headache. May repeat in 2 hours if headache persists or recurs.  Maximum doses in 24 hours.   [DISCONTINUED] albuterol (PROVENTIL HFA;VENTOLIN HFA) 108 (90 BASE) MCG/ACT inhaler Inhale 1-2 puffs into the lungs every 6 (six) hours as needed for wheezing or shortness of breath.   [DISCONTINUED] omeprazole (PRILOSEC) 20 MG capsule Take 1 capsule (20 mg total) by mouth 2 (two) times daily.   [DISCONTINUED] SUMAtriptan (IMITREX) 100 MG tablet Take 1 tablet earliest onset of migraine.  May repeat once in 2 hours if headache persists or recurs.  Do not exceed 2 tablets in 24 hours   No facility-administered encounter medications on file as of 01/18/2024.    Review of Systems  Review of Systems  Constitutional: Negative.   HENT: Negative.    Cardiovascular: Negative.   Gastrointestinal: Negative.   Allergic/Immunologic: Negative.   Neurological: Negative.   Psychiatric/Behavioral: Negative.       Objective:   BP 117/61  Pulse 87   Temp 98.1 F (36.7 C) (Oral)   Wt 243 lb 3.2 oz (110.3 kg)   LMP 12/19/2023   SpO2 100%   BMI 39.25 kg/m   Wt Readings from Last 5 Encounters:  01/18/24 243 lb 3.2 oz (110.3 kg)  01/17/24 243 lb 9.6 oz (110.5 kg)  01/06/24 246 lb (111.6 kg)  12/01/23 251 lb (113.9 kg)  07/22/23 246 lb (111.6 kg)     Physical Exam Vitals and nursing note reviewed.  Constitutional:      General: She is not in acute distress.    Appearance: She is well-developed.  Cardiovascular:     Rate and Rhythm: Normal rate and regular rhythm.  Pulmonary:      Effort: Pulmonary effort is normal.     Breath sounds: Normal breath sounds.  Neurological:     Mental Status: She is alert and oriented to person, place, and time.       Assessment & Plan:   BV (bacterial vaginosis) -     metroNIDAZOLE; Apply topically daily.  Dispense: 45 g; Refill: 0     Return in about 1 year (around 01/17/2025) for Physical.   Jerrlyn Morel, NP 01/18/2024

## 2024-01-19 ENCOUNTER — Ambulatory Visit: Admitting: General Practice

## 2024-01-19 VITALS — BP 118/66 | HR 83 | Ht 66.0 in | Wt 242.0 lb

## 2024-01-19 DIAGNOSIS — Z3A01 Less than 8 weeks gestation of pregnancy: Secondary | ICD-10-CM

## 2024-01-19 DIAGNOSIS — O3680X0 Pregnancy with inconclusive fetal viability, not applicable or unspecified: Secondary | ICD-10-CM

## 2024-01-19 DIAGNOSIS — O219 Vomiting of pregnancy, unspecified: Secondary | ICD-10-CM

## 2024-01-19 LAB — BETA HCG QUANT (REF LAB): hCG Quant: 349 m[IU]/mL

## 2024-01-19 MED ORDER — DOXYLAMINE-PYRIDOXINE 10-10 MG PO TBEC: DELAYED_RELEASE_TABLET | ORAL | 1 refills | Status: DC

## 2024-01-19 NOTE — Progress Notes (Signed)
 Beta HCG Follow-up Visit  Deanna Myers presents to [facility] for follow-up beta HCG lab. She was seen in MAU for  mild intermittent cramping  on 4/15. Patient reports  continued mild intermittent cramping  today. Discussed with patient that we are following beta HCG levels today. Results will be back in approximately 2 hours. Valid contact number for patient confirmed. Discussed she will be contacted with results later this afternoon. Also discussed uncertainly of the safety of Nurtec injections & Imitrex during pregnancy- could consider follow up at [facility] office for this if desired.  Beta HCG results:         4/15          207          Doria Garden 01/19/2024 12:31 PM

## 2024-01-20 ENCOUNTER — Encounter: Payer: Self-pay | Admitting: Obstetrics and Gynecology

## 2024-01-25 ENCOUNTER — Other Ambulatory Visit: Payer: Self-pay | Admitting: Nurse Practitioner

## 2024-01-25 ENCOUNTER — Telehealth: Payer: Self-pay

## 2024-01-25 DIAGNOSIS — B9689 Other specified bacterial agents as the cause of diseases classified elsewhere: Secondary | ICD-10-CM

## 2024-01-25 MED ORDER — METRONIDAZOLE 1 % EX GEL: Freq: Every day | CUTANEOUS | 0 refills | Status: DC

## 2024-01-25 NOTE — Telephone Encounter (Unsigned)
 Copied from CRM [id number]. Topic: Clinical - Prescription Issue >> Jan 25, 2024 11:38 AM Crispin Dolphin wrote: Reason for CRM: Patient called states pharmacy says they do not have Rx for metroNIDAZOLE  (METROGEL ) 1 % gel. Confirmed pharmacy is correct. Let her know It says it was received by pharmacy. Patient will try calling pharmacy back but wanted to see if there was anything provider could do on her end because she needs to start using it. Thank You

## 2024-01-27 NOTE — Telephone Encounter (Signed)
Pt was sent a my chart message . [name]

## 2024-01-28 ENCOUNTER — Encounter (HOSPITAL_BASED_OUTPATIENT_CLINIC_OR_DEPARTMENT_OTHER): Payer: Self-pay

## 2024-01-28 ENCOUNTER — Emergency Department (HOSPITAL_BASED_OUTPATIENT_CLINIC_OR_DEPARTMENT_OTHER)
Admission: EM | Admit: 2024-01-28 | Discharge: 2024-01-28 | Discharge status: Against Medical Advice | Attending: Emergency Medicine | Admitting: Emergency Medicine

## 2024-01-28 ENCOUNTER — Other Ambulatory Visit: Payer: Self-pay

## 2024-01-28 DIAGNOSIS — Z3A01 Less than 8 weeks gestation of pregnancy: Secondary | ICD-10-CM | POA: Insufficient documentation

## 2024-01-28 DIAGNOSIS — N644 Mastodynia: Secondary | ICD-10-CM | POA: Diagnosis not present

## 2024-01-28 DIAGNOSIS — O26891 Other specified pregnancy related conditions, first trimester: Secondary | ICD-10-CM | POA: Insufficient documentation

## 2024-01-28 DIAGNOSIS — Z5321 Procedure and treatment not carried out due to patient leaving prior to being seen by health care provider: Secondary | ICD-10-CM | POA: Insufficient documentation

## 2024-01-28 NOTE — ED Triage Notes (Signed)
 Patient is [redacted] weeks pregnant and reports bilateral breast pain. Also reports that her left breast is warm to the touch. Denies discharge from the breast.   Patient also states she has an abscess in her vaginal area. States this has occurred before about 3 weeks ago. Patient was placed on antibiotics after the abscess was drained here in the ED. Patient states it has returned with more pain.

## 2024-01-29 ENCOUNTER — Inpatient Hospital Stay (HOSPITAL_BASED_OUTPATIENT_CLINIC_OR_DEPARTMENT_OTHER)
Admission: EM | Admit: 2024-01-29 | Discharge: 2024-01-29 | Disposition: A | Discharge status: Home / Self Care | Attending: Obstetrics and Gynecology | Admitting: Obstetrics and Gynecology

## 2024-01-29 ENCOUNTER — Encounter (HOSPITAL_BASED_OUTPATIENT_CLINIC_OR_DEPARTMENT_OTHER): Payer: Self-pay | Admitting: Emergency Medicine

## 2024-01-29 ENCOUNTER — Other Ambulatory Visit: Payer: Self-pay

## 2024-01-29 DIAGNOSIS — N6459 Other signs and symptoms in breast: Secondary | ICD-10-CM

## 2024-01-29 DIAGNOSIS — O26891 Other specified pregnancy related conditions, first trimester: Secondary | ICD-10-CM

## 2024-01-29 DIAGNOSIS — Z87440 Personal history of urinary (tract) infections: Secondary | ICD-10-CM | POA: Diagnosis not present

## 2024-01-29 DIAGNOSIS — N764 Abscess of vulva: Secondary | ICD-10-CM | POA: Diagnosis not present

## 2024-01-29 DIAGNOSIS — O9229 Other disorders of breast associated with pregnancy and the puerperium: Secondary | ICD-10-CM | POA: Diagnosis not present

## 2024-01-29 DIAGNOSIS — Z3A01 Less than 8 weeks gestation of pregnancy: Secondary | ICD-10-CM | POA: Insufficient documentation

## 2024-01-29 DIAGNOSIS — N644 Mastodynia: Secondary | ICD-10-CM

## 2024-01-29 DIAGNOSIS — O23591 Infection of other part of genital tract in pregnancy, first trimester: Secondary | ICD-10-CM | POA: Insufficient documentation

## 2024-01-29 HISTORY — DX: Headache, unspecified: R51.9

## 2024-01-29 LAB — URINALYSIS, ROUTINE W REFLEX MICROSCOPIC
Bilirubin Urine: NEGATIVE
Glucose, UA: NEGATIVE mg/dL
Hgb urine dipstick: NEGATIVE
Ketones, ur: NEGATIVE mg/dL
Leukocytes,Ua: NEGATIVE
Nitrite: NEGATIVE
Protein, ur: NEGATIVE mg/dL
Specific Gravity, Urine: 1.017 (ref 1.005–1.030)
pH: 7 (ref 5.0–8.0)

## 2024-01-29 MED ORDER — CEPHALEXIN 500 MG PO CAPS: 500.0000 mg | ORAL_CAPSULE | Freq: Three times a day (TID) | ORAL | 0 refills | Status: DC

## 2024-01-29 MED ORDER — TERCONAZOLE 0.4 % VA CREA: 1.0000 | TOPICAL_CREAM | Freq: Every day | VAGINAL | 0 refills | Status: DC

## 2024-01-29 MED ORDER — OXYCODONE-ACETAMINOPHEN 5-325 MG PO TABS: 1.0000 | ORAL_TABLET | Freq: Three times a day (TID) | ORAL | 0 refills | Status: DC | PRN

## 2024-01-29 NOTE — MAU Provider Note (Signed)
 None     S Deanna Myers is a 33 y.o. [phone] pregnant female at [redacted]w[redacted]d who presents to MAU today with complaint of abscess of left mons. She reports this is an ongoing problem and she was seen and treated approximately 3 weeks ago. Previously prescribed bactrim  for UTI and abscess. Previously received I&D for abscess.   Has not yet established care this pregnancy.   Pertinent items noted in HPI and remainder of comprehensive ROS otherwise negative.   O BP 114/69   Pulse 81   Temp 99.1 F (37.3 C) (Oral)   Resp 20   LMP 12/19/2023   SpO2 100%  Physical Exam Vitals reviewed. Exam conducted with a chaperone present.  Constitutional:      General: She is not in acute distress.    Appearance: Normal appearance. She is not ill-appearing, toxic-appearing or diaphoretic.  HENT:     Head: Normocephalic.  Cardiovascular:     Rate and Rhythm: Normal rate and regular rhythm.     Pulses: Normal pulses.     Heart sounds: Normal heart sounds.  Pulmonary:     Effort: Pulmonary effort is normal.     Breath sounds: Normal breath sounds.  Chest:  Breasts:    Right: Tenderness present. No swelling or mass.     Left: Tenderness present. No swelling or mass.  Abdominal:       Comments: Non-indurated, non-fluctuant area of left mons. Slight erythema noted, approximately 3cm in diameter.   Genitourinary:      Comments: Non-indurated, non-fluctuant area of left mons. Slight erythema noted, approximately 3cm in diameter.  Skin:    General: Skin is warm and dry.     Capillary Refill: Capillary refill takes less than 2 seconds.  Neurological:     Mental Status: She is alert and oriented to person, place, and time.  Psychiatric:        Mood and Affect: Mood normal.        Behavior: Behavior normal.        Thought Content: Thought content normal.     Comments: Patient visibly distressed.       MDM: Reviewed previous notes PE Consultation with Dr. Racheal Buddle UA benign today.   MAU  Course:  A Abscess of labia majora - Mons with a 3cm area of erythema and slight induration  Mastalgia during pregnancy - Non-engorged, no signs of mastitis - No lumps  Medical screening exam complete  P Discharge from MAU in stable condition with routine precautions Follow up at preferred [facility] provider, resources for homebirth and OB providers provided.  - Keflex  500mg  3x daily for 7 days sent to preferred pharmacy.  - Limited supply of percocet 325/5mg  sent to preferred pharmacy (8 pills).  - Tylenol  as needed (not to exceed 4000mg  daily) for relief of pain. - Warm compresses twice daily and chlorahexidine wash daily to infected area. Avoid manipulation of area. - Cabbage leaves and cool compresses to breasts. - Consider imaging if patient returns after Keflex  treatment.   Allergies as of 01/29/2024       Reactions   Shellfish Allergy Anaphylaxis   Adhesive [tape] Swelling, Other (See Comments)   Reaction:  Bruising    Latex Hives   Pork-derived Products    Betadine [povidone Iodine] Rash        Medication List     STOP taking these medications    Aimovig  140 MG/ML Soaj Generic drug: Erenumab -aooe   metroNIDAZOLE  1 % gel Commonly known  as: Metrogel    Nurtec 75 MG Tbdp Generic drug: Rimegepant Sulfate   SUMAtriptan  20 MG/ACT nasal spray Commonly known as: IMITREX        TAKE these medications    cephALEXin  500 MG capsule Commonly known as: KEFLEX  Take 1 capsule (500 mg total) by mouth 3 (three) times daily.   Doxylamine -Pyridoxine  10-10 MG Tbec Commonly known as: Diclegis  2 tabs at bedtime. Can add 1 tab in the morning & 1 tab in the afternoon if needed   ondansetron  4 MG disintegrating tablet Commonly known as: ZOFRAN -ODT Take 1 tablet (4 mg total) by mouth every 8 (eight) hours as needed for nausea or vomiting.   oxyCODONE -acetaminophen  5-325 MG tablet Commonly known as: PERCOCET/ROXICET Take 1 tablet by mouth every 8 (eight) hours as needed for  severe pain (pain score 7-10).   terconazole 0.4 % vaginal cream Commonly known as: TERAZOL 7 Place 1 applicator vaginally at bedtime. Use for seven days        Raford Bunk, MSN, CNM 01/29/2024 11:22 AM  Certified Nurse Midwife, [facility]

## 2024-01-29 NOTE — Discharge Instructions (Addendum)
 Deanna Myers,  It was a pleasure taking care of you in the MAU today. Below are the recommendations we discussed.  Thank you for trusting us  to care for you, Abraham Hoffmann, Midwife  Chlorhexidine  wash (available at [facility] or [facility]) with showers. Warm compresses and soaks twice daily.   You may use cabbage leaves or cool compresses to the breast area for relief of the feelings of engorgement.   Home Birth Midwives in the [place]:  [facility]: Matias Soman, CNM [email or web address]  [facility]: Trygve Gage, CNM [email or web address] [phone]  [facility]: Cortney Long, CPM [email or web address] [phone]  [facility] [place]: Edra Govern, CNM [email or web address] [phone]  ** [facility]: Ernie Heal, CNM [email or web address] [phone]   Safe Medications in Pregnancy   Acne:  Benzoyl Peroxide  Salicylic Acid   Backache/Headache:  Tylenol : 2 regular strength every 4 hours OR               2 Extra strength every 6 hours   Colds/Coughs/Allergies:  Benadryl  (alcohol free) 25 mg every 6 hours as needed  Breath right strips  Claritin  Cepacol throat lozenges  Chloraseptic throat spray  Cold-Eeze- up to three times per day  Cough drops, alcohol free  Flonase  (by prescription only)  Guaifenesin  Mucinex  Robitussin DM (plain only, alcohol free)  Saline nasal spray/drops  Sudafed (pseudoephedrine) & Actifed * use only after [redacted] weeks gestation and if you do not have high blood pressure  Tylenol   Vicks Vaporub  Zinc lozenges  Zyrtec    Constipation:  Colace  Ducolax suppositories  Fleet enema  Glycerin  suppositories  Metamucil  Milk of magnesia  Miralax   Senokot  Smooth move tea   Diarrhea:  Kaopectate  Imodium A-D   *NO pepto Bismol   Hemorrhoids:   Anusol  Anusol HC  Preparation H  Tucks   Indigestion:  Tums  Maalox  Mylanta  Zantac   Pepcid    Insomnia:  Benadryl  (alcohol free) 25mg  every 6 hours as needed  Tylenol  PM  Unisom , no Gelcaps   Leg Cramps:  Tums  MagGel   Nausea/Vomiting:  Bonine  Dramamine  Emetrol  Ginger extract  Sea bands  Meclizine  Nausea medication to take during pregnancy:  Unisom  (doxylamine  succinate 25 mg tablets) Take one tablet daily at bedtime. If symptoms are not adequately controlled, the dose can be increased to a maximum recommended dose of two tablets daily (1/2 tablet in the morning, 1/2 tablet mid-afternoon and one at bedtime).  Vitamin B6 100mg  tablets. Take one tablet twice a day (up to 200 mg per day).   Skin Rashes:  Aveeno products  Benadryl  cream or 25mg  every 6 hours as needed  Calamine Lotion  1% cortisone cream   Yeast infection:  Gyne-lotrimin 7  Monistat  7    **If taking multiple medications, please check labels to avoid duplicating the same active ingredients  **take medication as directed on the label  ** Do not exceed 4000 mg of tylenol  in 24 hours  **Do not take medications that contain aspirin or ibuprofen           [facility] [employer] [facility] [employer]             [address], [place], [place] [address] [phone]  [facility] [employer] at [employer]  [address], [place], [place], [address] [phone]  Center for [facility] [address], [place], [place], [address] [phone]  Center for [facility] at [facility] [address], [place], [place], [address] [phone]  Center for [facility] at [facility]                                 [address] [place], [place], [place], [address] [phone]  Center for [facility] at [facility]                                    [address] [place], [place], [address] [phone]  Center for [facility] at [facility] [address], [place], [place], [address]                              [facility] [address], [place], [place], [address] [phone]  [facility]         Phone: [phone]  [facility]      Phone: [phone]   [facility]      Phone: [phone]  [facility]         Phone: [phone]   [facility]    Phone: [phone]  [facility]      Phone: [phone]  [facility]     Phone: [phone]  [facility]        Phone: [phone]  [facility]) [phone]  [facility]      Phone: [phone]

## 2024-01-29 NOTE — ED Provider Notes (Signed)
 [place] EMERGENCY DEPARTMENT AT [facility] Provider Note   CSN: [id number] Arrival date & time: 01/29/24  4098     History  Chief Complaint  Patient presents with   Abscess    Deanna Myers is a 33 y.o. female.  HPI    33 year old female comes in with chief complaint of abscess.  Patient has history of folliculitis, UTI and is currently about [redacted] weeks pregnant.  She has 2 primary complaints.  #1, patient complains of pain in the vaginal region.  She states that she had abscess drained from her " vaginal mild" in the ER and subsequently she took antibiotics.  She said that there was drainage of abscess and the wound was left open, it closed spontaneously in 2 days.  Initially the swelling got down, but then the swelling started returning, and now she is back to where pain was prior to the abscess drainage.  Patient denies any associated drainage, nausea, vomiting, fevers, chills.  Patient states that she has seen her OB, but she saw them for primary pregnancy and did not discuss the abscess in detail.  She did see her PCP a week later, they had told her that she is recovering well and if it gets worse to just come back to the ER.  #2, patient also is complaining of breast engorgement.  She feels that her breast are engorged, sore week.  She has no drainage, she did not experience the same engorgement with her previous pregnancy.  Home Medications Prior to Admission medications   Medication Sig Start Date End Date Taking? Authorizing Provider  Doxylamine -Pyridoxine  (DICLEGIS ) 10-10 MG TBEC 2 tabs at bedtime. Can add 1 tab in the morning & 1 tab in the afternoon if needed Patient not taking: Reported on 01/19/2024 01/19/24   Felipe Horton, [place] , CNM  Erenumab -aooe (AIMOVIG ) 140 MG/ML SOAJ Inject 140 mg into the skin every 30 (thirty) days. Patient not taking: Reported on 01/19/2024 12/01/23   Merriam Abbey, DO  metroNIDAZOLE  (METROGEL ) 1 % gel Apply topically daily. 01/25/24    Jerrlyn Morel, NP  ondansetron  (ZOFRAN -ODT) 4 MG disintegrating tablet Take 1 tablet (4 mg total) by mouth every 8 (eight) hours as needed for nausea or vomiting. 01/06/24   Rolinda Climes, DO  Rimegepant Sulfate (NURTEC) 75 MG TBDP Take 1 tablet (75 mg total) by mouth daily as needed. 12/01/23   Merriam Abbey, DO  SUMAtriptan  (IMITREX ) 20 MG/ACT nasal spray Place 1 spray (20 mg total) into the nose as needed for migraine or headache. May repeat in 2 hours if headache persists or recurs.  Maximum doses in 24 hours. 12/01/23   Merriam Abbey, DO  albuterol  (PROVENTIL  HFA;VENTOLIN  HFA) 108 (90 BASE) MCG/ACT inhaler Inhale 1-2 puffs into the lungs every 6 (six) hours as needed for wheezing or shortness of breath.  05/21/20  [provider]  omeprazole  (PRILOSEC) 20 MG capsule Take 1 capsule (20 mg total) by mouth 2 (two) times daily. 07/03/17 09/18/19  Eino Gravel, MD  SUMAtriptan  (IMITREX ) 100 MG tablet Take 1 tablet earliest onset of migraine.  May repeat once in 2 hours if headache persists or recurs.  Do not exceed 2 tablets in 24 hours 02/10/18 05/21/20  Merriam Abbey, DO      Allergies    Shellfish allergy, Adhesive [tape], Latex, and Betadine [povidone iodine]    Review of Systems   Review of Systems  All other systems reviewed and are negative.   Physical Exam  Updated Vital Signs BP 131/68   Pulse 92   Temp 98.3 F (36.8 C) (Oral)   Resp 16   LMP 12/19/2023   SpO2 100%  Physical Exam Vitals and nursing note reviewed. Exam conducted with a chaperone present.  Constitutional:      Appearance: She is well-developed.  HENT:     Head: Atraumatic.  Cardiovascular:     Rate and Rhythm: Normal rate.  Pulmonary:     Effort: Pulmonary effort is normal.  Genitourinary:    Comments: Over the left superior labia majora area, patient has about 4-5 centimeter area of induration and tenderness and edema without profound erythema.  There is no pustule.  There is surgical incision wound  noted within that region.  Patient has significant tenderness to palpation Musculoskeletal:     Cervical back: Normal range of motion and neck supple.  Skin:    General: Skin is warm and dry.  Neurological:     Mental Status: She is alert and oriented to person, place, and time.     ED Results / Procedures / Treatments   Labs (all labs ordered are listed, but only abnormal results are displayed) Labs Reviewed - No data to display  EKG None  Radiology No results found.  Procedures Procedures    Medications Ordered in ED Medications - No data to display  ED Course/ Medical Decision Making/ A&P                                 Medical Decision Making  33 year old patient G5, P2 comes in with chief complaint of breast engorgement and vaginal discomfort.  Differential diagnosis includes epidermoid cyst that is infected, folliculitis with complication of abscess, hematoma. The breast changes are likely hormonal response of first trimester.  Patient was seen in the ER on 4-4.  Subsequently she was seen at MAU on 4-15.  It appears that patient may have left AMA, but although there is mention of the abscess, the history and exam does not focus on it.  The primary workup seems to be related to first-trimester pregnancy.  Given that patient is coming in for repeat visit for what appears to be an abscess that was drained in the ER, in a sensitive area -I think it would be best that she is managed by a gynecologist who can evaluate, treat and follow-up on the abscess.  Patient's significant discomfort with previous abscess drainage in the ER (she states that the localized numbing did not help at all), only elevates my desire that patient's condition is taken care of properly immediately.  This plan has been discussed with the patient and she is comfortable going straight to MAU via private conveyance.  Final Clinical Impression(s) / ED Diagnoses Final diagnoses:  Abscess of labia  majora    Rx / DC Orders ED Discharge Orders     None         Deatra Face, MD 01/29/24 [phone]

## 2024-01-29 NOTE — ED Triage Notes (Signed)
 Pt caox4, ambulatory c/o vaginal abscess stating she had incision and drainage a few weeks ago with PO abx. Pt also 5-[redacted] wks pregnant c/o bilateral breast engorgement.

## 2024-01-29 NOTE — MAU Note (Signed)
.  Deanna Myers is a 33 y.o. at [redacted]w[redacted]d here in MAU reporting: breast pain to L breast that began with pregnancy. Did not experience similar pain in previous pregnancies. Abscess to labia majora that was previously drained in the emergency department on 01/06/24, initially improved but has worsened again.   Onset of complaint: 01/06/24  Vitals:   01/29/24 0740 01/29/24 0801  BP: 131/68   Pulse: 92   Resp: 16   Temp: 98.3 F (36.8 C)   SpO2: 100% 100%      Lab orders placed from triage: UA

## 2024-01-29 NOTE — ED Notes (Signed)
 Pt leaving dept for POV transfer to MAU

## 2024-02-09 ENCOUNTER — Inpatient Hospital Stay (HOSPITAL_COMMUNITY)

## 2024-02-09 ENCOUNTER — Encounter (HOSPITAL_COMMUNITY): Payer: Self-pay | Admitting: Obstetrics and Gynecology

## 2024-02-09 ENCOUNTER — Inpatient Hospital Stay (HOSPITAL_COMMUNITY)
Admission: AD | Admit: 2024-02-09 | Discharge: 2024-02-09 | Disposition: A | Discharge status: Home / Self Care | Attending: Obstetrics and Gynecology | Admitting: Obstetrics and Gynecology

## 2024-02-09 DIAGNOSIS — Z3A01 Less than 8 weeks gestation of pregnancy: Secondary | ICD-10-CM

## 2024-02-09 DIAGNOSIS — E860 Dehydration: Secondary | ICD-10-CM | POA: Diagnosis present

## 2024-02-09 DIAGNOSIS — O219 Vomiting of pregnancy, unspecified: Secondary | ICD-10-CM | POA: Diagnosis not present

## 2024-02-09 DIAGNOSIS — O211 Hyperemesis gravidarum with metabolic disturbance: Secondary | ICD-10-CM | POA: Diagnosis not present

## 2024-02-09 LAB — URINALYSIS, ROUTINE W REFLEX MICROSCOPIC
Bacteria, UA: NONE SEEN
Bilirubin Urine: NEGATIVE
Glucose, UA: NEGATIVE mg/dL
Hgb urine dipstick: NEGATIVE
Ketones, ur: 80 mg/dL — AB
Leukocytes,Ua: NEGATIVE
Nitrite: NEGATIVE
Protein, ur: 30 mg/dL — AB
Specific Gravity, Urine: 1.023 (ref 1.005–1.030)
pH: 8 (ref 5.0–8.0)

## 2024-02-09 LAB — CBC WITH DIFFERENTIAL/PLATELET
Abs Immature Granulocytes: 0.02 10*3/uL (ref 0.00–0.07)
Basophils Absolute: 0 10*3/uL (ref 0.0–0.1)
Basophils Relative: 0 %
Eosinophils Absolute: 0.1 10*3/uL (ref 0.0–0.5)
Eosinophils Relative: 1 %
HCT: 35.4 % — ABNORMAL LOW (ref 36.0–46.0)
Hemoglobin: 11.8 g/dL — ABNORMAL LOW (ref 12.0–15.0)
Immature Granulocytes: 0 %
Lymphocytes Relative: 31 %
Lymphs Abs: 2.3 10*3/uL (ref 0.7–4.0)
MCH: 27.6 pg (ref 26.0–34.0)
MCHC: 33.3 g/dL (ref 30.0–36.0)
MCV: 82.7 fL (ref 80.0–100.0)
Monocytes Absolute: 0.4 10*3/uL (ref 0.1–1.0)
Monocytes Relative: 6 %
Neutro Abs: 4.7 10*3/uL (ref 1.7–7.7)
Neutrophils Relative %: 62 %
Platelets: 353 10*3/uL (ref 150–400)
RBC: 4.28 MIL/uL (ref 3.87–5.11)
RDW: 14.3 % (ref 11.5–15.5)
WBC: 7.6 10*3/uL (ref 4.0–10.5)
nRBC: 0 % (ref 0.0–0.2)

## 2024-02-09 LAB — COMPREHENSIVE METABOLIC PANEL WITH GFR
ALT: 19 U/L (ref 0–44)
AST: 22 U/L (ref 15–41)
Albumin: 3.4 g/dL — ABNORMAL LOW (ref 3.5–5.0)
Alkaline Phosphatase: 36 U/L — ABNORMAL LOW (ref 38–126)
Anion gap: 10 (ref 5–15)
BUN: 5 mg/dL — ABNORMAL LOW (ref 6–20)
CO2: 22 mmol/L (ref 22–32)
Calcium: 9 mg/dL (ref 8.9–10.3)
Chloride: 102 mmol/L (ref 98–111)
Creatinine, Ser: 0.49 mg/dL (ref 0.44–1.00)
GFR, Estimated: >60 mL/min (ref >60)
Glucose, Bld: 91 mg/dL (ref 70–99)
Potassium: 3.6 mmol/L (ref 3.5–5.1)
Sodium: 134 mmol/L — ABNORMAL LOW (ref 135–145)
Total Bilirubin: 0.7 mg/dL (ref 0.0–1.2)
Total Protein: 7.3 g/dL (ref 6.5–8.1)

## 2024-02-09 MED ORDER — CHEWING GUM (ORBIT) SUGAR FREE
1.0000 | CHEWING_GUM | Freq: Once | ORAL | Status: AC
  Administered 2024-02-09: 1 via ORAL
  Filled 2024-02-09: qty 1

## 2024-02-09 MED ORDER — PROMETHAZINE HCL 25 MG PO TABS: 25.0000 mg | ORAL_TABLET | Freq: Four times a day (QID) | ORAL | 0 refills | Status: DC | PRN

## 2024-02-09 MED ORDER — SCOPOLAMINE 1 MG/3DAYS TD PT72: 1.0000 | MEDICATED_PATCH | TRANSDERMAL | 0 refills | Status: AC

## 2024-02-09 MED ORDER — ONDANSETRON HCL 4 MG/2ML IJ SOLN
4.0000 mg | Freq: Once | INTRAMUSCULAR | Status: AC
  Administered 2024-02-09: 4 mg via INTRAVENOUS
  Filled 2024-02-09: qty 2

## 2024-02-09 MED ORDER — SODIUM CHLORIDE 0.9 % IV SOLN
25.0000 mg | Freq: Once | INTRAVENOUS | Status: AC
  Administered 2024-02-09: 25 mg via INTRAVENOUS
  Filled 2024-02-09: qty 1

## 2024-02-09 MED ORDER — LACTATED RINGERS IV BOLUS
1000.0000 mL | Freq: Once | INTRAVENOUS | Status: AC
  Administered 2024-02-09: 1000 mL via INTRAVENOUS

## 2024-02-09 MED ORDER — INFUVITE ADULT IV SOLN
Freq: Once | INTRAVENOUS | Status: AC
  Filled 2024-02-09: qty 10

## 2024-02-09 MED ORDER — SCOPOLAMINE 1 MG/3DAYS TD PT72
1.0000 | MEDICATED_PATCH | Freq: Once | TRANSDERMAL | Status: DC
  Administered 2024-02-09: 1.5 mg via TRANSDERMAL
  Filled 2024-02-09: qty 1

## 2024-02-09 MED ORDER — FAMOTIDINE 20 MG PO TABS
40.0000 mg | ORAL_TABLET | Freq: Once | ORAL | Status: AC
  Administered 2024-02-09: 40 mg via ORAL
  Filled 2024-02-09: qty 2

## 2024-02-09 MED ORDER — ONDANSETRON 4 MG PO TBDP: 4.0000 mg | ORAL_TABLET | Freq: Three times a day (TID) | ORAL | 0 refills | Status: DC | PRN

## 2024-02-09 NOTE — MAU Provider Note (Signed)
 History     CSN: [id number]  Arrival date and time: 02/09/24 0820   Event Date/Time   First Provider Initiated Contact with Patient 02/09/24 1026      Chief Complaint  Patient presents with   Emesis   Nausea   Abdominal Pain   Leg Pain   HPI  Deanna Myers is a 33 y.o. female [phone] at [redacted]w[redacted]d with a past history of hyperemesis gravidarum, migraines, and appendectomy who presents to the maternity assessment unit with concerns of nausea, vomiting, and dehydration. On interview, she states being in her usual state of health until 10 days ago when her current episode of nausea and vomiting started. Since then, the reports having worsening nausea and vomiting. Her nausea has been constant throughout this period and she has been vomiting approximately 10 times per day (more when having eaten food). She reports experiencing hyperemesis gravidarum during her past two pregnancies. There are no factors that make her nausea and vomiting worse, but reports that delivery is her definite answer. She endorses obtaining "banana bags" of fluids and vitamins as well as IV zofran  throughout her last two pregnancies. She reports that her vomiting occurs regardless of eating and describes her emesis as dark green/yellow liquid with black clumps. She reports not being able to keep any food, liquids, or medication down. She denies any bright red blood or dark blood in her vomit. She states that vomiting causes her to strain her abdominal and pelvic floor muscles, but denies any acute pain. She rates her nausea as 9/10 and vomiting 10/10 (with 10 being the most severe). She endorses a poor appetite and poor hydration with no bowel movement in the past 5 days. She endorses severe cramping in her lower legs with accompanied lightheadedness and dizziness, but denies any falls. She denies any other abdominal or pelvic pain, abnormal uterine bleeding, vaginal discharge, contractions, or fetal movements. She denies any fever,  flank pain, dysuria, or hematuria. She states that her current condition is similar to her presentation of hyperemesis gravidarum during her previous two pregnancies.  Emesis  Associated symptoms include abdominal pain, chills, dizziness and myalgias. Pertinent negatives include no chest pain, coughing, diarrhea, fever or headaches.  Abdominal Pain Associated symptoms include constipation, myalgias, nausea and vomiting. Pertinent negatives include no diarrhea, dysuria, fever, frequency, headaches or hematuria.  Leg Pain     OB History     Gravida  4   Para  2   Term  2   Preterm      AB  1   Living  2      SAB  1   IAB      Ectopic      Multiple  0   Live Births  2           Past Medical History:  Diagnosis Date   ADHD (attention deficit hyperactivity disorder)    since childhood   Asthma    inhaler last used prior to pregnancy   BV (bacterial vaginosis)    Chlamydia    GERD (gastroesophageal reflux disease)    Gonorrhea    Headache    Hyperemesis    G3    Past Surgical History:  Procedure Laterality Date   CESAREAN SECTION N/A 12/21/2015   Procedure: CESAREAN SECTION;  Surgeon: Rogene Claude, MD;  Location: [facility];  Service: Obstetrics;  Laterality: N/A;   LAPAROSCOPIC APPENDECTOMY N/A 12/21/2021   Procedure: APPENDECTOMY LAPAROSCOPIC;  Surgeon: Lujean Sake, MD;  Location: [facility]  OR;  Service: General;  Laterality: N/A;    Family History  Problem Relation Age of Onset   Hypertension Father    Hypertension Mother    Kidney disease Mother    Cervical cancer Paternal Aunt    Congestive Heart Failure Maternal Grandmother    Breast cancer Paternal Grandfather     Social History   Tobacco Use   Smoking status: Never   Smokeless tobacco: Never  Vaping Use   Vaping status: Never Used  Substance Use Topics   Alcohol use: Not Currently    Comment: social   Drug use: No    Allergies:  Allergies  Allergen Reactions   Shellfish Allergy  Anaphylaxis   Adhesive [Tape] Swelling and Other (See Comments)    Reaction:  Bruising    Latex Hives   Pork-Derived Products    Betadine [Povidone Iodine] Rash    Medications Prior to Admission  Medication Sig Dispense Refill Last Dose/Taking   cephALEXin  (KEFLEX ) 500 MG capsule Take 1 capsule (500 mg total) by mouth 3 (three) times daily. 21 capsule 0    Doxylamine -Pyridoxine  (DICLEGIS ) 10-10 MG TBEC 2 tabs at bedtime. Can add 1 tab in the morning & 1 tab in the afternoon if needed (Patient not taking: Reported on 01/19/2024) 100 tablet 1    ondansetron  (ZOFRAN -ODT) 4 MG disintegrating tablet Take 1 tablet (4 mg total) by mouth every 8 (eight) hours as needed for nausea or vomiting. 14 tablet 0    oxyCODONE -acetaminophen  (PERCOCET/ROXICET) 5-325 MG tablet Take 1 tablet by mouth every 8 (eight) hours as needed for severe pain (pain score 7-10). 8 tablet 0    terconazole  (TERAZOL 7 ) 0.4 % vaginal cream Place 1 applicator vaginally at bedtime. Use for seven days 45 g 0     Review of Systems  Constitutional:  Positive for chills and diaphoresis. Negative for fever.  HENT:  Positive for sore throat. Negative for hearing loss and tinnitus.   Eyes:  Negative for visual disturbance.  Respiratory:  Negative for cough, shortness of breath and wheezing.   Cardiovascular:  Negative for chest pain and palpitations.  Gastrointestinal:  Positive for abdominal pain, constipation, nausea and vomiting. Negative for diarrhea.  Genitourinary:  Negative for dysuria, flank pain, frequency, hematuria, urgency and vaginal bleeding.  Musculoskeletal:  Positive for myalgias.  Neurological:  Positive for dizziness and light-headedness. Negative for headaches.   Physical Exam   Blood pressure 121/66, pulse 85, temperature 98.3 F (36.8 C), temperature source Oral, resp. rate 18, height 5\' 6"  (1.676 m), weight 109.2 kg, last menstrual period 12/19/2023, SpO2 100%.  Physical Exam Cardiovascular:     Rate and  Rhythm: Normal rate and regular rhythm.     Heart sounds: Normal heart sounds.  Pulmonary:     Effort: Pulmonary effort is normal.     Breath sounds: Normal breath sounds.  Abdominal:     General: Abdomen is flat. Bowel sounds are normal.     Palpations: Abdomen is soft.     Tenderness: There is no abdominal tenderness.  Neurological:     Mental Status: She is alert and oriented to person, place, and time.  No results found for this or any previous visit (from the past 24 hours).   MAU Course  Procedures  MDM - Order CMP to assess for electrolyte imbalances  - Na, Alb, and ALP L (134, 3.4, and 36, respectively), rest WNL - Order CBC w/diff to assess for dehydration  - Hgb and HCT L (11.8  and 35.4, respectively), rest WNL - Order UA to assess for dehydration  - Hazy appearance, positive for ketones and protein (80 and 30, respectively), rest WNL - Order US  to visualize pregnancy - Order beta hCG to assess pregnancy stage - Start IV lactated ringer 's for dehydration - Start IV Zofran  and Promethazine  to treat nausea and vomiting - Start scopolamine  patch to treat nausea and vomiting - Start Pepcid  to treat nausea - Start IV vitamins for prenatal supplementation - FHT: N/A  Assessment and Plan  Deanna Myers is a 33 y.o. female [phone] at [redacted]w[redacted]d with a past history of hyperemesis gravidarum, migraines, and appendectomy who presents with a 10-day history of nausea, vomiting, poor oral intake of food and fluid, lightheadedness, dizziness, and lower leg cramping. Her constellation of symptoms are consistent with dehydration due to hyperemesis gravidarum. Labs were drawn to assess for dehydration and came back WNL. US  and beta hCG ordered to visual and assess pregnancy. US  showed intrauterine pregnancy. IV fluids and nausea medication were started to help with dehydration, nausea, and vomiting.  Discharge home Refill for Zofran  sent to patient's pharmacy Patient also prescribed  Phenergan  and scopolamine  patches. 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 I developed the management plan that is described in the student's note, and I agree with the content, with my edits above.    Janna Melter, MD Attending Family Medicine Physician, [facility] for [facility], [facility]

## 2024-02-09 NOTE — Discharge Instructions (Signed)
 It was a pleasure taking care of you today.  I am sorry you are having issues with the nausea and vomiting.  I am glad that the fluids and medications helped.  I sent a prescription for Phenergan  as well as scopolamine  to your pharmacy.  I also refilled the Zofran .  Please do not take the Phenergan  and Zofran  at the same time.  If you have any worsening symptoms or other concerns please return for further evaluation.  I hope you have a wonderful night!

## 2024-02-09 NOTE — MAU Note (Signed)
 Deanna Myers is a 33 y.o. at [redacted]w[redacted]d here in MAU reporting: hyperemesis is flaring. Is out of zofran  yesterday. Was told to come in for IVF. Is just freaking dehydrated.  Having extreme leg cramps, because she is so dehydrated.  Cramping in her abd from the dry heaving Normal d/c, no bleeding.  Just so hungry. Onset of complaint: ongoing problems Pain score: abd 8, legs 9 Vitals:   02/09/24 0927  BP: 121/66  Pulse: 85  Resp: 18  Temp: 98.3 F (36.8 C)  SpO2: 100%      Lab orders placed from triage:  uri

## 2024-02-18 ENCOUNTER — Emergency Department
Admission: EM | Admit: 2024-02-18 | Discharge: 2024-02-18 | Disposition: A | Discharge status: Home / Self Care | Attending: Emergency Medicine | Admitting: Emergency Medicine

## 2024-02-18 ENCOUNTER — Other Ambulatory Visit: Payer: Self-pay

## 2024-02-18 ENCOUNTER — Encounter: Payer: Self-pay | Admitting: Emergency Medicine

## 2024-02-18 DIAGNOSIS — Z3A08 8 weeks gestation of pregnancy: Secondary | ICD-10-CM | POA: Diagnosis not present

## 2024-02-18 DIAGNOSIS — O219 Vomiting of pregnancy, unspecified: Secondary | ICD-10-CM | POA: Diagnosis present

## 2024-02-18 LAB — CBC WITH DIFFERENTIAL/PLATELET
Abs Immature Granulocytes: 0.02 10*3/uL (ref 0.00–0.07)
Basophils Absolute: 0 10*3/uL (ref 0.0–0.1)
Basophils Relative: 0 %
Eosinophils Absolute: 0 10*3/uL (ref 0.0–0.5)
Eosinophils Relative: 0 %
HCT: 36.8 % (ref 36.0–46.0)
Hemoglobin: 12.2 g/dL (ref 12.0–15.0)
Immature Granulocytes: 0 %
Lymphocytes Relative: 19 %
Lymphs Abs: 1.6 10*3/uL (ref 0.7–4.0)
MCH: 27.8 pg (ref 26.0–34.0)
MCHC: 33.2 g/dL (ref 30.0–36.0)
MCV: 83.8 fL (ref 80.0–100.0)
Monocytes Absolute: 0.3 10*3/uL (ref 0.1–1.0)
Monocytes Relative: 3 %
Neutro Abs: 6.5 10*3/uL (ref 1.7–7.7)
Neutrophils Relative %: 78 %
Platelets: 455 10*3/uL — ABNORMAL HIGH (ref 150–400)
RBC: 4.39 MIL/uL (ref 3.87–5.11)
RDW: 14 % (ref 11.5–15.5)
WBC: 8.4 10*3/uL (ref 4.0–10.5)
nRBC: 0 % (ref 0.0–0.2)

## 2024-02-18 LAB — COMPREHENSIVE METABOLIC PANEL WITH GFR
ALT: 16 U/L (ref 0–44)
AST: 21 U/L (ref 15–41)
Albumin: 3.8 g/dL (ref 3.5–5.0)
Alkaline Phosphatase: 37 U/L — ABNORMAL LOW (ref 38–126)
Anion gap: 10 (ref 5–15)
BUN: <5 mg/dL — ABNORMAL LOW (ref 6–20)
CO2: 24 mmol/L (ref 22–32)
Calcium: 9.2 mg/dL (ref 8.9–10.3)
Chloride: 102 mmol/L (ref 98–111)
Creatinine, Ser: 0.45 mg/dL (ref 0.44–1.00)
GFR, Estimated: >60 mL/min (ref >60)
Glucose, Bld: 105 mg/dL — ABNORMAL HIGH (ref 70–99)
Potassium: 3.7 mmol/L (ref 3.5–5.1)
Sodium: 136 mmol/L (ref 135–145)
Total Bilirubin: 0.6 mg/dL (ref 0.0–1.2)
Total Protein: 7.9 g/dL (ref 6.5–8.1)

## 2024-02-18 LAB — HCG, QUANTITATIVE, PREGNANCY: hCG, Beta Chain, Quant, S: 135164 m[IU]/mL — ABNORMAL HIGH (ref <5)

## 2024-02-18 MED ORDER — METOCLOPRAMIDE HCL 5 MG/ML IJ SOLN
10.0000 mg | Freq: Once | INTRAMUSCULAR | Status: AC
  Administered 2024-02-18: 10 mg via INTRAVENOUS
  Filled 2024-02-18: qty 2

## 2024-02-18 MED ORDER — ONDANSETRON HCL 4 MG/2ML IJ SOLN
4.0000 mg | Freq: Once | INTRAMUSCULAR | Status: AC
  Administered 2024-02-18: 4 mg via INTRAVENOUS
  Filled 2024-02-18: qty 2

## 2024-02-18 MED ORDER — DEXTROSE IN LACTATED RINGERS 5 % IV SOLN
1000.0000 mL | Freq: Once | INTRAVENOUS | Status: AC
  Administered 2024-02-18: 1000 mL via INTRAVENOUS

## 2024-02-18 MED ORDER — SODIUM CHLORIDE 0.9 % IV BOLUS
1000.0000 mL | Freq: Once | INTRAVENOUS | Status: AC
  Administered 2024-02-18: 1000 mL via INTRAVENOUS

## 2024-02-18 NOTE — ED Notes (Addendum)
 Pt requesting medications for nausea and acid refulx at this time. Claria Crofts, MD at bedside.

## 2024-02-18 NOTE — ED Notes (Signed)
Pt had 1 episode of emesis. 

## 2024-02-18 NOTE — ED Triage Notes (Signed)
 Pt is [redacted]wks pregnant in with reported Hyperemesis Gravidarum - pt was was admitted for this on 5/8. Pt states she is taking Zofran , Phenergan , Diclegis  and Scopalamine patches and is having no relief of symptoms. Reports >10 episodes of emesis in past 24hrs

## 2024-02-18 NOTE — ED Provider Notes (Signed)
 [facility] Provider Note    Event Date/Time   First MD Initiated Contact with Patient 02/18/24 0319     (approximate)   History   Hyperemesis Gravidarum   HPI  Deanna Myers is a 33 year old G4, P2 with history of hyperemesis gravidarum presenting to the emergency department for evaluation of vomiting and pregnancy.  Patient reports recurrent vomiting during pregnancy.  Reports muscle strain from vomiting over her abdomen, otherwise denies abdominal pain.  Has been taking home medications without benefit.  Reports similar episodes during multiple prior pregnancies requiring frequent IV fluids and nausea medication.  No reported vaginal bleeding, loss of fluid.  I reviewed her documentation from MAU from 02/09/2024.  At that time patient presented with nausea and vomiting.  She was treated with IV fluids and nausea medication.  She was discharged with prescriptions for Zofran , Phenergan .  Ultrasound from 02/09/2024 demonstrates single IUP measuring at 7 weeks.       Physical Exam   Triage Vital Signs: ED Triage Vitals  Encounter Vitals Group     BP 02/18/24 0109 123/78     Systolic BP Percentile --      Diastolic BP Percentile --      Pulse Rate 02/18/24 0109 86     Resp 02/18/24 0109 18     Temp 02/18/24 0109 98.3 F (36.8 C)     Temp Source 02/18/24 0109 Oral     SpO2 02/18/24 0109 100 %     Weight 02/18/24 0109 240 lb (108.9 kg)     Height 02/18/24 0109 5\' 6"  (1.676 m)     Head Circumference --      Peak Flow --      Pain Score 02/18/24 0115 5     Pain Loc --      Pain Education --      Exclude from Growth Chart --     Most recent vital signs: Vitals:   02/18/24 0109 02/18/24 0427  BP: 123/78 (!) 113/56  Pulse: 86 75  Resp: 18 18  Temp: 98.3 F (36.8 C)   SpO2: 100% 100%     General: Awake, interactive  CV:  Regular rate, good peripheral perfusion.  Resp:  Unlabored respirations Abd:  Nondistended, soft, nontender to  palpation Neuro:  Symmetric facial movement, fluid speech   ED Results / Procedures / Treatments   Labs (all labs ordered are listed, but only abnormal results are displayed) Labs Reviewed  CBC WITH DIFFERENTIAL/PLATELET - Abnormal; Notable for the following components:      Result Value   Platelets 455 (*)    All other components within normal limits  COMPREHENSIVE METABOLIC PANEL WITH GFR - Abnormal; Notable for the following components:   Glucose, Bld 105 (*)    BUN <5 (*)    Alkaline Phosphatase 37 (*)    All other components within normal limits  HCG, QUANTITATIVE, PREGNANCY - Abnormal; Notable for the following components:   hCG, Beta Chain, Quant, S 135,164 (*)    All other components within normal limits     EKG EKG independently reviewed interpreted by myself (ER attending) demonstrates:    RADIOLOGY Imaging independently reviewed and interpreted by myself demonstrates:   Formal Radiology Read:  No results found.  PROCEDURES:  Critical Care performed: No  Procedures   MEDICATIONS ORDERED IN ED: Medications  ondansetron  (ZOFRAN ) injection 4 mg (has no administration in time range)  sodium chloride  0.9 % bolus 1,000 mL (0 mLs Intravenous  Stopped 02/18/24 0343)  ondansetron  (ZOFRAN ) injection 4 mg (4 mg Intravenous Given 02/18/24 0145)  dextrose  5 % in lactated ringers  infusion (0 mLs Intravenous Stopped 02/18/24 0533)  metoCLOPramide  (REGLAN ) injection 10 mg (10 mg Intravenous Given 02/18/24 0346)     IMPRESSION / MDM / ASSESSMENT AND PLAN / ED COURSE  I reviewed the triage vital signs and the nursing notes.  Differential diagnosis includes, but is not limited to, vomiting and pregnancy, hyperemesis gravidarum, electrolyte abnormality  Patient's presentation is most consistent with acute presentation with potential threat to life or bodily function.  33 year old presenting with vomiting in pregnancy despite attempted outpatient management.  Stable vitals  on presentation.  Labs without critical derangement.  Recent ultrasound, do not think there is an indication for repeat imaging, reassuring abdominal exam.  No urinary symptoms.  Received IV fluids and Zofran  from triage.  Reported some improvement of ongoing nausea.  Ordered for a bag of D5 LR as well as Reglan .  Following this she was able to tolerate some fluids.  She is requesting discharge home.  With multimodal nausea control options available at home, do think this is reasonable.  Strict return precautions provided.  Patient discharged in stable condition.      FINAL CLINICAL IMPRESSION(S) / ED DIAGNOSES   Final diagnoses:  Nausea and vomiting in pregnancy     Rx / DC Orders   ED Discharge Orders     None        Note:  This document was prepared using Dragon voice recognition software and may include unintentional dictation errors.   Claria Crofts, MD 02/18/24 [phone]

## 2024-02-18 NOTE — Discharge Instructions (Addendum)
 He received in the emergency department for your vomiting.  Your blood work was fortunately overall reassuring.  You can continue to use the medications you have been prescribed to help with your symptoms.  Follow-up with your OB/GYN as directed.  Return to the ER for new or worsening symptoms.

## 2024-02-21 ENCOUNTER — Encounter (INDEPENDENT_AMBULATORY_CARE_PROVIDER_SITE_OTHER): Payer: Self-pay

## 2024-02-21 ENCOUNTER — Telehealth: Payer: Self-pay

## 2024-02-21 NOTE — Transitions of Care (Post Inpatient/ED Visit) (Signed)
   02/21/2024  Name: Deanna Myers MRN: [id number] DOB: 1990-11-02  Today's TOC FU Call Status: Today's TOC FU Call Status:: Unsuccessful Call (1st Attempt) Unsuccessful Call (1st Attempt) Date: 02/21/24  Attempted to reach the patient regarding the most recent Inpatient/ED visit.  Follow Up Plan: Additional outreach attempts will be made to reach the patient to complete the Transitions of Care (Post Inpatient/ED visit) call.   Signature  [employer], [place]

## 2024-02-22 ENCOUNTER — Telehealth: Payer: Self-pay

## 2024-02-22 NOTE — Transitions of Care (Post Inpatient/ED Visit) (Signed)
   02/22/2024  Name: Deanna Myers MRN: [id number] DOB: March 25, 1991  Today's TOC FU Call Status: Today's TOC FU Call Status:: Unsuccessful Call (2nd Attempt) Unsuccessful Call (2nd Attempt) Date: 02/22/24  Attempted to reach the patient regarding the most recent Inpatient/ED visit.  Follow Up Plan: Additional outreach attempts will be made to reach the patient to complete the Transitions of Care (Post Inpatient/ED visit) call.   Signature  [employer], [place]

## 2024-02-27 ENCOUNTER — Inpatient Hospital Stay (HOSPITAL_COMMUNITY)
Admission: AD | Admit: 2024-02-27 | Discharge: 2024-02-28 | Disposition: A | Discharge status: Home / Self Care | Attending: Obstetrics and Gynecology | Admitting: Obstetrics and Gynecology

## 2024-02-27 ENCOUNTER — Encounter (HOSPITAL_COMMUNITY): Payer: Self-pay | Admitting: Obstetrics and Gynecology

## 2024-02-27 DIAGNOSIS — Z760 Encounter for issue of repeat prescription: Secondary | ICD-10-CM | POA: Insufficient documentation

## 2024-02-27 DIAGNOSIS — Z3A10 10 weeks gestation of pregnancy: Secondary | ICD-10-CM | POA: Diagnosis not present

## 2024-02-27 DIAGNOSIS — O219 Vomiting of pregnancy, unspecified: Secondary | ICD-10-CM

## 2024-02-27 DIAGNOSIS — O210 Mild hyperemesis gravidarum: Secondary | ICD-10-CM | POA: Insufficient documentation

## 2024-02-27 LAB — CBC
HCT: 36.6 % (ref 36.0–46.0)
Hemoglobin: 12.2 g/dL (ref 12.0–15.0)
MCH: 27.9 pg (ref 26.0–34.0)
MCHC: 33.3 g/dL (ref 30.0–36.0)
MCV: 83.8 fL (ref 80.0–100.0)
Platelets: 394 10*3/uL (ref 150–400)
RBC: 4.37 MIL/uL (ref 3.87–5.11)
RDW: 13.4 % (ref 11.5–15.5)
WBC: 9.2 10*3/uL (ref 4.0–10.5)
nRBC: 0 % (ref 0.0–0.2)

## 2024-02-27 LAB — COMPREHENSIVE METABOLIC PANEL WITH GFR
ALT: 20 U/L (ref 0–44)
AST: 24 U/L (ref 15–41)
Albumin: 3.3 g/dL — ABNORMAL LOW (ref 3.5–5.0)
Alkaline Phosphatase: 36 U/L — ABNORMAL LOW (ref 38–126)
Anion gap: 13 (ref 5–15)
BUN: 5 mg/dL — ABNORMAL LOW (ref 6–20)
CO2: 22 mmol/L (ref 22–32)
Calcium: 9.4 mg/dL (ref 8.9–10.3)
Chloride: 99 mmol/L (ref 98–111)
Creatinine, Ser: 0.59 mg/dL (ref 0.44–1.00)
GFR, Estimated: >60 mL/min (ref >60)
Glucose, Bld: 84 mg/dL (ref 70–99)
Potassium: 3.3 mmol/L — ABNORMAL LOW (ref 3.5–5.1)
Sodium: 134 mmol/L — ABNORMAL LOW (ref 135–145)
Total Bilirubin: 0.4 mg/dL (ref 0.0–1.2)
Total Protein: 7.4 g/dL (ref 6.5–8.1)

## 2024-02-27 LAB — URINALYSIS, ROUTINE W REFLEX MICROSCOPIC
Bilirubin Urine: NEGATIVE
Glucose, UA: NEGATIVE mg/dL
Hgb urine dipstick: NEGATIVE
Ketones, ur: 20 mg/dL — AB
Leukocytes,Ua: NEGATIVE
Nitrite: NEGATIVE
Protein, ur: NEGATIVE mg/dL
Specific Gravity, Urine: 1.011 (ref 1.005–1.030)
pH: 6 (ref 5.0–8.0)

## 2024-02-27 MED ORDER — GLYCOPYRROLATE 0.2 MG/ML IJ SOLN
0.2000 mg | Freq: Once | INTRAMUSCULAR | Status: AC
  Administered 2024-02-27: 0.2 mg via INTRAVENOUS
  Filled 2024-02-27: qty 1

## 2024-02-27 MED ORDER — INFUVITE ADULT IV SOLN
Freq: Once | INTRAVENOUS | Status: AC
  Filled 2024-02-27: qty 1000

## 2024-02-27 MED ORDER — METOCLOPRAMIDE HCL 5 MG/ML IJ SOLN
10.0000 mg | Freq: Once | INTRAMUSCULAR | Status: AC
  Administered 2024-02-27: 10 mg via INTRAVENOUS
  Filled 2024-02-27: qty 2

## 2024-02-27 MED ORDER — METHYLPREDNISOLONE SODIUM SUCC 125 MG IJ SOLR
48.0000 mg | Freq: Once | INTRAMUSCULAR | Status: AC
  Administered 2024-02-27: 48 mg via INTRAVENOUS
  Filled 2024-02-27: qty 2

## 2024-02-27 MED ORDER — SODIUM CHLORIDE 0.9 % IV SOLN
12.5000 mg | Freq: Once | INTRAVENOUS | Status: AC
  Administered 2024-02-27: 12.5 mg via INTRAVENOUS
  Filled 2024-02-27: qty 0.5

## 2024-02-27 MED ORDER — FAMOTIDINE IN NACL 20-0.9 MG/50ML-% IV SOLN
20.0000 mg | Freq: Once | INTRAVENOUS | Status: AC
  Administered 2024-02-27: 20 mg via INTRAVENOUS
  Filled 2024-02-27: qty 50

## 2024-02-27 MED ORDER — LACTATED RINGERS IV BOLUS
1000.0000 mL | Freq: Once | INTRAVENOUS | Status: AC
  Administered 2024-02-27: 1000 mL via INTRAVENOUS

## 2024-02-27 MED ORDER — SCOPOLAMINE 1 MG/3DAYS TD PT72
1.0000 | MEDICATED_PATCH | TRANSDERMAL | Status: DC
  Administered 2024-02-27: 1.5 mg via TRANSDERMAL
  Filled 2024-02-27: qty 1

## 2024-02-27 MED ORDER — ONDANSETRON HCL 4 MG/2ML IJ SOLN
4.0000 mg | Freq: Four times a day (QID) | INTRAMUSCULAR | Status: DC
  Administered 2024-02-27: 4 mg via INTRAVENOUS
  Filled 2024-02-27: qty 2

## 2024-02-27 NOTE — MAU Provider Note (Incomplete Revision)
 History     CSN: [id number]  Arrival date and time: 02/27/24 1727   Event Date/Time   First Provider Initiated Contact with Patient 02/27/24 1835      Chief Complaint  Patient presents with   Nausea   Emesis   [name] is a 33 y.o. year old [id number] female at [redacted]w[redacted]d weeks gestation who presents to MAU for IV fluids per her OB. She has a history of HG and has been seen in MAU recently for treatment. Nausea and vomiting is worse today more than 10xs. Has not been able to keep anything down. Positive for unexpected weight loss (about 14lbs). Tried phenergan  and zofran  today without improvement. Has not tried scope patch today. She is considering termination due to not being able to maintain her job in this state. She endorses feeling dizzy when standing and having abdominal soreness due to vomiting. Denies vaginal bleeding.   Emesis     OB History     Gravida  4   Para  2   Term  2   Preterm      AB  1   Living  2      SAB  1   IAB      Ectopic      Multiple  0   Live Births  2           Past Medical History:  Diagnosis Date   ADHD (attention deficit hyperactivity disorder)    since childhood   Asthma    inhaler last used prior to pregnancy   BV (bacterial vaginosis)    Chlamydia    GERD (gastroesophageal reflux disease)    Gonorrhea    Headache    Hyperemesis    G3    Past Surgical History:  Procedure Laterality Date   CESAREAN SECTION N/A 12/21/2015   Procedure: CESAREAN SECTION;  Surgeon: Rogene Claude, MD;  Location: [facility];  Service: Obstetrics;  Laterality: N/A;   LAPAROSCOPIC APPENDECTOMY N/A 12/21/2021   Procedure: APPENDECTOMY LAPAROSCOPIC;  Surgeon: Lujean Sake, MD;  Location: [facility] OR;  Service: General;  Laterality: N/A;    Family History  Problem Relation Age of Onset   Hypertension Father    Hypertension Mother    Kidney disease Mother    Cervical cancer Paternal Aunt    Congestive Heart Failure Maternal  Grandmother    Breast cancer Paternal Grandfather     Social History   Tobacco Use   Smoking status: Never   Smokeless tobacco: Never  Vaping Use   Vaping status: Never Used  Substance Use Topics   Alcohol use: Not Currently    Comment: social   Drug use: No    Allergies:  Allergies  Allergen Reactions   Shellfish Allergy Anaphylaxis   Adhesive [Tape] Swelling and Other (See Comments)    Reaction:  Bruising    Latex Hives   Pork-Derived Products    Betadine [Povidone Iodine] Rash    Medications Prior to Admission  Medication Sig Dispense Refill Last Dose/Taking   ondansetron  (ZOFRAN -ODT) 4 MG disintegrating tablet Take 1 tablet (4 mg total) by mouth every 8 (eight) hours as needed for nausea or vomiting. 14 tablet 0 02/27/2024 Morning   oxyCODONE -acetaminophen  (PERCOCET/ROXICET) 5-325 MG tablet Take 1 tablet by mouth every 8 (eight) hours as needed for severe pain (pain score 7-10). 8 tablet 0 Past Week   promethazine  (PHENERGAN ) 25 MG tablet Take 1 tablet (25 mg total) by mouth every 6 (six)  hours as needed for nausea or vomiting. 30 tablet 0 02/27/2024 Morning   scopolamine  (TRANSDERM-SCOP) 1 MG/3DAYS Place 1 patch (1.5 mg total) onto the skin every 3 (three) days. 10 patch 0 Past Week   Doxylamine -Pyridoxine  (DICLEGIS ) 10-10 MG TBEC 2 tabs at bedtime. Can add 1 tab in the morning & 1 tab in the afternoon if needed (Patient not taking: Reported on 01/19/2024) 100 tablet 1    terconazole  (TERAZOL 7 ) 0.4 % vaginal cream Place 1 applicator vaginally at bedtime. Use for seven days (Patient not taking: Reported on 02/27/2024) 45 g 0 Not Taking    Review of Systems  Gastrointestinal:  Positive for vomiting.  Pertinent items noted in HPI and remainder of comprehensive ROS otherwise negative.  Physical Exam   Blood pressure 126/69, pulse (!) 102, temperature 98.4 F (36.9 C), temperature source Oral, resp. rate 16, last menstrual period 12/19/2023, SpO2 100%.  Physical  Exam HENT:     Head: Normocephalic.     Mouth/Throat:     Mouth: Mucous membranes are dry.  Eyes:     Pupils: Pupils are equal, round, and reactive to light.  Cardiovascular:     Rate and Rhythm: Tachycardia present.  Pulmonary:     Effort: Pulmonary effort is normal.  Abdominal:     General: Abdomen is flat.  Skin:    General: Skin is dry.  Neurological:     Mental Status: She is alert. Mental status is at baseline.  Psychiatric:        Mood and Affect: Affect is tearful.     MAU Course  Procedures  MDM Results for orders placed or performed during the hospital encounter of 02/27/24 (from the past 24 hours)  Urinalysis, Routine w reflex microscopic -Urine, Clean Catch     Status: Abnormal   Collection Time: 02/27/24  7:09 PM  Result Value Ref Range   Color, Urine YELLOW YELLOW   APPearance HAZY (A) CLEAR   Specific Gravity, Urine 1.011 1.005 - 1.030   pH 6.0 5.0 - 8.0   Glucose, UA NEGATIVE NEGATIVE mg/dL   Hgb urine dipstick NEGATIVE NEGATIVE   Bilirubin Urine NEGATIVE NEGATIVE   Ketones, ur 20 (A) NEGATIVE mg/dL   Protein, ur NEGATIVE NEGATIVE mg/dL   Nitrite NEGATIVE NEGATIVE   Leukocytes,Ua NEGATIVE NEGATIVE  Comprehensive metabolic panel     Status: Abnormal   Collection Time: 02/27/24  7:31 PM  Result Value Ref Range   Sodium 134 (L) 135 - 145 mmol/L   Potassium 3.3 (L) 3.5 - 5.1 mmol/L   Chloride 99 98 - 111 mmol/L   CO2 22 22 - 32 mmol/L   Glucose, Bld 84 70 - 99 mg/dL   BUN 5 (L) 6 - 20 mg/dL   Creatinine, Ser 4.09 0.44 - 1.00 mg/dL   Calcium  9.4 8.9 - 10.3 mg/dL   Total Protein 7.4 6.5 - 8.1 g/dL   Albumin 3.3 (L) 3.5 - 5.0 g/dL   AST 24 15 - 41 U/L   ALT 20 0 - 44 U/L   Alkaline Phosphatase 36 (L) 38 - 126 U/L   Total Bilirubin 0.4 0.0 - 1.2 mg/dL   GFR, Estimated >81 >19 mL/min   Anion gap 13 5 - 15  CBC     Status: None   Collection Time: 02/27/24  7:31 PM  Result Value Ref Range   WBC 9.2 4.0 - 10.5 K/uL   RBC 4.37 3.87 - 5.11 MIL/uL    Hemoglobin 12.2 12.0 - 15.0  g/dL   HCT 16.1 09.6 - 04.5 %   MCV 83.8 80.0 - 100.0 fL   MCH 27.9 26.0 - 34.0 pg   MCHC 33.3 30.0 - 36.0 g/dL   RDW 40.9 81.1 - 91.4 %   Platelets 394 150 - 400 K/uL   nRBC 0.0 0.0 - 0.2 %   IV LR bolus CBC, CMP, UA IV phenergan  IV Infuvite Pepcid , Robinul , Zofran , Phenergan , Scopolamine   Assessment and Plan    Wilford Hanks 02/27/2024, 8:01 PM    Midwife Attestation:  I personally saw and evaluated the patient, performing the key elements of the service. I developed and verified the management plan that is described in the resident's/student's note, and I agree with the content with my edits above. VSS, HRR&R, Resp unlabored, Legs neg.   Report given to Loetta Ringer, CNM, with IV medications running and labs pending.   Arlester Bence, CNM 9:47 PM    Reassessment (10:41 PM) -Nurse reports patient with continued nausea, but no vomiting. -Give IV reglan  and reassess.

## 2024-02-27 NOTE — MAU Provider Note (Incomplete)
 History     CSN: [id number]  Arrival date and time: 02/27/24 1727   Event Date/Time   First Provider Initiated Contact with Patient 02/27/24 1835      Chief Complaint  Patient presents with  . Nausea  . Emesis   [name] is a 33 y.o. year old G70P2012 female at [redacted]w[redacted]d weeks gestation who presents to MAU for IV fluids per her OB. She has a history of HG and has been seen in MAU recently for treatment. Nausea and vomiting is worse today more than 10xs. Has not been able to keep anything down. Positive for unexpected weight loss (about 14lbs). Tried phenergan  and zofran  today without improvement. Has not tried scope patch today. She is considering termination due to not being able to maintain her job in this state. She endorses feeling dizzy when standing and having abdominal soreness due to vomiting. Denies vaginal bleeding.   Emesis     OB History     Gravida  4   Para  2   Term  2   Preterm      AB  1   Living  2      SAB  1   IAB      Ectopic      Multiple  0   Live Births  2           Past Medical History:  Diagnosis Date  . ADHD (attention deficit hyperactivity disorder)    since childhood  . Asthma    inhaler last used prior to pregnancy  . BV (bacterial vaginosis)   . Chlamydia   . GERD (gastroesophageal reflux disease)   . Gonorrhea   . Headache   . Hyperemesis    G3    Past Surgical History:  Procedure Laterality Date  . CESAREAN SECTION N/A 12/21/2015   Procedure: CESAREAN SECTION;  Surgeon: Rogene Claude, MD;  Location: [facility];  Service: Obstetrics;  Laterality: N/A;  . LAPAROSCOPIC APPENDECTOMY N/A 12/21/2021   Procedure: APPENDECTOMY LAPAROSCOPIC;  Surgeon: Lujean Sake, MD;  Location: [facility] OR;  Service: General;  Laterality: N/A;    Family History  Problem Relation Age of Onset  . Hypertension Father   . Hypertension Mother   . Kidney disease Mother   . Cervical cancer Paternal Aunt   . Congestive Heart  Failure Maternal Grandmother   . Breast cancer Paternal Grandfather     Social History   Tobacco Use  . Smoking status: Never  . Smokeless tobacco: Never  Vaping Use  . Vaping status: Never Used  Substance Use Topics  . Alcohol use: Not Currently    Comment: social  . Drug use: No    Allergies:  Allergies  Allergen Reactions  . Shellfish Allergy Anaphylaxis  . Adhesive [Tape] Swelling and Other (See Comments)    Reaction:  Bruising   . Latex Hives  . Pork-Derived Products   . Betadine [Povidone Iodine] Rash    Medications Prior to Admission  Medication Sig Dispense Refill Last Dose/Taking  . ondansetron  (ZOFRAN -ODT) 4 MG disintegrating tablet Take 1 tablet (4 mg total) by mouth every 8 (eight) hours as needed for nausea or vomiting. 14 tablet 0 02/27/2024 Morning  . oxyCODONE -acetaminophen  (PERCOCET/ROXICET) 5-325 MG tablet Take 1 tablet by mouth every 8 (eight) hours as needed for severe pain (pain score 7-10). 8 tablet 0 Past Week  . promethazine  (PHENERGAN ) 25 MG tablet Take 1 tablet (25 mg total) by mouth every 6 (six)  hours as needed for nausea or vomiting. 30 tablet 0 02/27/2024 Morning  . scopolamine  (TRANSDERM-SCOP) 1 MG/3DAYS Place 1 patch (1.5 mg total) onto the skin every 3 (three) days. 10 patch 0 Past Week  . Doxylamine -Pyridoxine  (DICLEGIS ) 10-10 MG TBEC 2 tabs at bedtime. Can add 1 tab in the morning & 1 tab in the afternoon if needed (Patient not taking: Reported on 01/19/2024) 100 tablet 1   . terconazole  (TERAZOL 7 ) 0.4 % vaginal cream Place 1 applicator vaginally at bedtime. Use for seven days (Patient not taking: Reported on 02/27/2024) 45 g 0 Not Taking    Review of Systems  Gastrointestinal:  Positive for vomiting.  Pertinent items noted in HPI and remainder of comprehensive ROS otherwise negative.  Physical Exam   Blood pressure 126/69, pulse (!) 102, temperature 98.4 F (36.9 C), temperature source Oral, resp. rate 16, last menstrual period 12/19/2023,  SpO2 100%.  Physical Exam HENT:     Head: Normocephalic.     Mouth/Throat:     Mouth: Mucous membranes are dry.  Eyes:     Pupils: Pupils are equal, round, and reactive to light.  Cardiovascular:     Rate and Rhythm: Tachycardia present.  Pulmonary:     Effort: Pulmonary effort is normal.  Abdominal:     General: Abdomen is flat.  Skin:    General: Skin is dry.  Neurological:     Mental Status: She is alert. Mental status is at baseline.  Psychiatric:        Mood and Affect: Affect is tearful.     MAU Course  Procedures  MDM Results for orders placed or performed during the hospital encounter of 02/27/24 (from the past 24 hours)  Urinalysis, Routine w reflex microscopic -Urine, Clean Catch     Status: Abnormal   Collection Time: 02/27/24  7:09 PM  Result Value Ref Range   Color, Urine YELLOW YELLOW   APPearance HAZY (A) CLEAR   Specific Gravity, Urine 1.011 1.005 - 1.030   pH 6.0 5.0 - 8.0   Glucose, UA NEGATIVE NEGATIVE mg/dL   Hgb urine dipstick NEGATIVE NEGATIVE   Bilirubin Urine NEGATIVE NEGATIVE   Ketones, ur 20 (A) NEGATIVE mg/dL   Protein, ur NEGATIVE NEGATIVE mg/dL   Nitrite NEGATIVE NEGATIVE   Leukocytes,Ua NEGATIVE NEGATIVE  Comprehensive metabolic panel     Status: Abnormal   Collection Time: 02/27/24  7:31 PM  Result Value Ref Range   Sodium 134 (L) 135 - 145 mmol/L   Potassium 3.3 (L) 3.5 - 5.1 mmol/L   Chloride 99 98 - 111 mmol/L   CO2 22 22 - 32 mmol/L   Glucose, Bld 84 70 - 99 mg/dL   BUN 5 (L) 6 - 20 mg/dL   Creatinine, Ser 1.61 0.44 - 1.00 mg/dL   Calcium  9.4 8.9 - 10.3 mg/dL   Total Protein 7.4 6.5 - 8.1 g/dL   Albumin 3.3 (L) 3.5 - 5.0 g/dL   AST 24 15 - 41 U/L   ALT 20 0 - 44 U/L   Alkaline Phosphatase 36 (L) 38 - 126 U/L   Total Bilirubin 0.4 0.0 - 1.2 mg/dL   GFR, Estimated >09 >60 mL/min   Anion gap 13 5 - 15  CBC     Status: None   Collection Time: 02/27/24  7:31 PM  Result Value Ref Range   WBC 9.2 4.0 - 10.5 K/uL   RBC 4.37  3.87 - 5.11 MIL/uL   Hemoglobin 12.2 12.0 - 15.0  g/dL   HCT 91.4 78.2 - 95.6 %   MCV 83.8 80.0 - 100.0 fL   MCH 27.9 26.0 - 34.0 pg   MCHC 33.3 30.0 - 36.0 g/dL   RDW 21.3 08.6 - 57.8 %   Platelets 394 150 - 400 K/uL   nRBC 0.0 0.0 - 0.2 %   IV LR bolus CBC, CMP, UA IV phenergan  IV Infuvite Pepcid , Robinul , Zofran , Phenergan , Scopolamine   Assessment and Plan    Wilford Hanks 02/27/2024, 8:01 PM    Midwife Attestation:  I personally saw and evaluated the patient, performing the key elements of the service. I developed and verified the management plan that is described in the resident's/student's note, and I agree with the content with my edits above. VSS, HRR&R, Resp unlabored, Legs neg.   Report given to Loetta Ringer, CNM, with IV medications running and labs pending.   Arlester Bence, CNM 9:47 PM    Reassessment (10:41 PM) -Nurse reports patient with continued nausea, but no vomiting. -Give IV reglan  and reassess.   Reassessment (11:20 PM) -Nurse states patient requesting discharge with medications. -Provider to bedside.  Patient requesting refill for all medications. -Also reports interest in steroid taper as discussed with earlier provider. Reviewed taper and will give initial dose now via IV. Patient agreeable. -Will also send robinul  as discussed with previous provider. -Patient to keep next appt as scheduled. -Precautions reviewed.  -RX for phenergan  suppository, Zofran  ODT, Robinul , and Medrol Taper sent to pharmacy on file.

## 2024-02-27 NOTE — MAU Provider Note (Cosign Needed Addendum)
 History     CSN: [id number]  Arrival date and time: 02/27/24 1727   Event Date/Time   First Provider Initiated Contact with Patient 02/27/24 1835      Chief Complaint  Patient presents with   Nausea   Emesis   [name] is a 33 y.o. year old G15P2012 female at [redacted]w[redacted]d weeks gestation who presents to MAU for IV fluids per her OB. She has a history of HG and has been seen in MAU recently for treatment. Nausea and vomiting is worse today more than 10xs. Has not been able to keep anything down. Positive for unexpected weight loss (about 14lbs). Tried phenergan  and zofran  today without improvement. Has not tried scope patch today. She is considering termination due to not being able to maintain her job in this state. She endorses feeling dizzy when standing and having abdominal soreness due to vomiting. Denies vaginal bleeding.   Emesis     OB History     Gravida  4   Para  2   Term  2   Preterm      AB  1   Living  2      SAB  1   IAB      Ectopic      Multiple  0   Live Births  2           Past Medical History:  Diagnosis Date   ADHD (attention deficit hyperactivity disorder)    since childhood   Asthma    inhaler last used prior to pregnancy   BV (bacterial vaginosis)    Chlamydia    GERD (gastroesophageal reflux disease)    Gonorrhea    Headache    Hyperemesis    G3    Past Surgical History:  Procedure Laterality Date   CESAREAN SECTION N/A 12/21/2015   Procedure: CESAREAN SECTION;  Surgeon: Rogene Claude, MD;  Location: [facility];  Service: Obstetrics;  Laterality: N/A;   LAPAROSCOPIC APPENDECTOMY N/A 12/21/2021   Procedure: APPENDECTOMY LAPAROSCOPIC;  Surgeon: Lujean Sake, MD;  Location: [facility] OR;  Service: General;  Laterality: N/A;    Family History  Problem Relation Age of Onset   Hypertension Father    Hypertension Mother    Kidney disease Mother    Cervical cancer Paternal Aunt    Congestive Heart Failure Maternal  Grandmother    Breast cancer Paternal Grandfather     Social History   Tobacco Use   Smoking status: Never   Smokeless tobacco: Never  Vaping Use   Vaping status: Never Used  Substance Use Topics   Alcohol use: Not Currently    Comment: social   Drug use: No    Allergies:  Allergies  Allergen Reactions   Shellfish Allergy Anaphylaxis   Adhesive [Tape] Swelling and Other (See Comments)    Reaction:  Bruising    Latex Hives   Pork-Derived Products    Betadine [Povidone Iodine] Rash    Medications Prior to Admission  Medication Sig Dispense Refill Last Dose/Taking   ondansetron  (ZOFRAN -ODT) 4 MG disintegrating tablet Take 1 tablet (4 mg total) by mouth every 8 (eight) hours as needed for nausea or vomiting. 14 tablet 0 02/27/2024 Morning   oxyCODONE -acetaminophen  (PERCOCET/ROXICET) 5-325 MG tablet Take 1 tablet by mouth every 8 (eight) hours as needed for severe pain (pain score 7-10). 8 tablet 0 Past Week   promethazine  (PHENERGAN ) 25 MG tablet Take 1 tablet (25 mg total) by mouth every 6 (six)  hours as needed for nausea or vomiting. 30 tablet 0 02/27/2024 Morning   scopolamine  (TRANSDERM-SCOP) 1 MG/3DAYS Place 1 patch (1.5 mg total) onto the skin every 3 (three) days. 10 patch 0 Past Week   Doxylamine -Pyridoxine  (DICLEGIS ) 10-10 MG TBEC 2 tabs at bedtime. Can add 1 tab in the morning & 1 tab in the afternoon if needed (Patient not taking: Reported on 01/19/2024) 100 tablet 1    terconazole  (TERAZOL 7 ) 0.4 % vaginal cream Place 1 applicator vaginally at bedtime. Use for seven days (Patient not taking: Reported on 02/27/2024) 45 g 0 Not Taking    Review of Systems  Gastrointestinal:  Positive for vomiting.  Pertinent items noted in HPI and remainder of comprehensive ROS otherwise negative.  Physical Exam   Blood pressure 126/69, pulse (!) 102, temperature 98.4 F (36.9 C), temperature source Oral, resp. rate 16, last menstrual period 12/19/2023, SpO2 100%.  Physical  Exam HENT:     Head: Normocephalic.     Mouth/Throat:     Mouth: Mucous membranes are dry.  Eyes:     Pupils: Pupils are equal, round, and reactive to light.  Cardiovascular:     Rate and Rhythm: Tachycardia present.  Pulmonary:     Effort: Pulmonary effort is normal.  Abdominal:     General: Abdomen is flat.  Skin:    General: Skin is dry.  Neurological:     Mental Status: She is alert. Mental status is at baseline.  Psychiatric:        Mood and Affect: Affect is tearful.     MAU Course  Procedures  MDM Results for orders placed or performed during the hospital encounter of 02/27/24 (from the past 24 hours)  Urinalysis, Routine w reflex microscopic -Urine, Clean Catch     Status: Abnormal   Collection Time: 02/27/24  7:09 PM  Result Value Ref Range   Color, Urine YELLOW YELLOW   APPearance HAZY (A) CLEAR   Specific Gravity, Urine 1.011 1.005 - 1.030   pH 6.0 5.0 - 8.0   Glucose, UA NEGATIVE NEGATIVE mg/dL   Hgb urine dipstick NEGATIVE NEGATIVE   Bilirubin Urine NEGATIVE NEGATIVE   Ketones, ur 20 (A) NEGATIVE mg/dL   Protein, ur NEGATIVE NEGATIVE mg/dL   Nitrite NEGATIVE NEGATIVE   Leukocytes,Ua NEGATIVE NEGATIVE  Comprehensive metabolic panel     Status: Abnormal   Collection Time: 02/27/24  7:31 PM  Result Value Ref Range   Sodium 134 (L) 135 - 145 mmol/L   Potassium 3.3 (L) 3.5 - 5.1 mmol/L   Chloride 99 98 - 111 mmol/L   CO2 22 22 - 32 mmol/L   Glucose, Bld 84 70 - 99 mg/dL   BUN 5 (L) 6 - 20 mg/dL   Creatinine, Ser 8.11 0.44 - 1.00 mg/dL   Calcium  9.4 8.9 - 10.3 mg/dL   Total Protein 7.4 6.5 - 8.1 g/dL   Albumin 3.3 (L) 3.5 - 5.0 g/dL   AST 24 15 - 41 U/L   ALT 20 0 - 44 U/L   Alkaline Phosphatase 36 (L) 38 - 126 U/L   Total Bilirubin 0.4 0.0 - 1.2 mg/dL   GFR, Estimated >91 >47 mL/min   Anion gap 13 5 - 15  CBC     Status: None   Collection Time: 02/27/24  7:31 PM  Result Value Ref Range   WBC 9.2 4.0 - 10.5 K/uL   RBC 4.37 3.87 - 5.11 MIL/uL    Hemoglobin 12.2 12.0 - 15.0  g/dL   HCT 81.1 91.4 - 78.2 %   MCV 83.8 80.0 - 100.0 fL   MCH 27.9 26.0 - 34.0 pg   MCHC 33.3 30.0 - 36.0 g/dL   RDW 95.6 21.3 - 08.6 %   Platelets 394 150 - 400 K/uL   nRBC 0.0 0.0 - 0.2 %   IV LR bolus CBC, CMP, UA IV phenergan  IV Infuvite Pepcid , Robinul , Zofran , Phenergan , Scopolamine  Prescriptions Assessment and Plan    Wilford Hanks 02/27/2024, 8:01 PM    Midwife Attestation:  I personally saw and evaluated the patient, performing the key elements of the service. I developed and verified the management plan that is described in the resident's/student's note, and I agree with the content with my edits above. VSS, HRR&R, Resp unlabored, Legs neg.   Report given to Loetta Ringer, CNM, with IV medications running and labs pending.   Arlester Bence, CNM 9:47 PM    Reassessment (10:41 PM) -Nurse reports patient with continued nausea, but no vomiting. -Give IV reglan  and reassess.   Reassessment (11:20 PM) -Nurse states patient requesting discharge with medications. -Provider to bedside.  Patient requesting refill for all medications. -Also reports interest in steroid taper as discussed with earlier provider. Reviewed taper and will give initial dose now via IV. Patient agreeable. -Will also send robinul  as discussed with previous provider. -Patient to keep next appt as scheduled. -Precautions reviewed.  -RX for phenergan  suppository, Zofran  ODT, Robinul , and Medrol  Taper sent to pharmacy on file.   Kraig Peru .cre

## 2024-02-27 NOTE — MAU Note (Signed)
..  Deanna Myers is a 33 y.o. at [redacted]w[redacted]d here in MAU reporting: has HG, states she has been unable to keep anything down since Saturday night. Is taking zofran  and phenergan  suppositories with no relief. Denies diarrhea. Also reports soreness in her lower abdomen from throwing up.   Pain score: 8 Vitals:   02/27/24 1737  BP: 121/79  Resp: 16  Temp: 98.4 F (36.9 C)  SpO2: 100%   Lab orders placed from triage:   UA

## 2024-02-28 ENCOUNTER — Telehealth: Payer: Self-pay

## 2024-02-28 DIAGNOSIS — Z3A10 10 weeks gestation of pregnancy: Secondary | ICD-10-CM

## 2024-02-28 DIAGNOSIS — O210 Mild hyperemesis gravidarum: Secondary | ICD-10-CM

## 2024-02-28 MED ORDER — ONDANSETRON 4 MG PO TBDP: 4.0000 mg | ORAL_TABLET | Freq: Three times a day (TID) | ORAL | 1 refills | Status: DC | PRN

## 2024-02-28 MED ORDER — GLYCOPYRROLATE 1 MG PO TABS: 1.0000 mg | ORAL_TABLET | Freq: Three times a day (TID) | ORAL | 0 refills | Status: AC

## 2024-02-28 MED ORDER — METHYLPREDNISOLONE 16 MG PO TABS: ORAL_TABLET | ORAL | 0 refills | Status: DC

## 2024-02-28 MED ORDER — PROMETHAZINE HCL 25 MG RE SUPP: 25.0000 mg | Freq: Four times a day (QID) | RECTAL | 0 refills | Status: DC | PRN

## 2024-02-28 MED ORDER — METHYLPREDNISOLONE 4 MG PO TABS: ORAL_TABLET | ORAL | 0 refills | Status: DC

## 2024-02-28 MED ORDER — METHYLPREDNISOLONE 8 MG PO TABS: ORAL_TABLET | ORAL | 0 refills | Status: DC

## 2024-02-28 MED ORDER — PROMETHAZINE HCL 25 MG PO TABS: 25.0000 mg | ORAL_TABLET | Freq: Four times a day (QID) | ORAL | 1 refills | Status: DC | PRN

## 2024-02-28 NOTE — Transitions of Care (Post Inpatient/ED Visit) (Signed)
   02/28/2024  Name: Deanna Myers MRN: [id number] DOB: 11/07/1990  Today's TOC FU Call Status:    Attempted to reach the patient regarding the most recent Inpatient/ED visit.  Follow Up Plan: No further outreach attempts will be made at this time. We have been unable to contact the patient.  Signature  [employer], [place]

## 2024-02-28 NOTE — Discharge Instructions (Addendum)
 Deanna Myers

## 2024-03-01 ENCOUNTER — Other Ambulatory Visit: Payer: Self-pay

## 2024-03-01 DIAGNOSIS — O210 Mild hyperemesis gravidarum: Secondary | ICD-10-CM

## 2024-03-01 MED ORDER — METHYLPREDNISOLONE 4 MG PO TABS: ORAL_TABLET | ORAL | 0 refills | Status: DC

## 2024-03-01 NOTE — Progress Notes (Signed)
 Deanna Myers [birth year] [id number]  Patient Location:  Patient called and verified her identity via birth date and last 4 of her SSN.  Patient informed that provider had received her message citing difficulty obtaining medications. Patient reports that her insurance will only allow her to obtain 4mg  dose of medrol .  Informed that provider will send in new prescription with only 4mg  dosing and that she is to follow guidelines provided prior to discharge for taper.  Patient agreeable and expresses gratitude.  No questions or concerns.   Kraig Peru MSN, CNM Advanced Practice Provider, [facility]  Provider Location: MAU   **This visit was completed, in its entirety, via telehealth communications.  I personally spent >/=2 minutes on the telephone providing recommendations, education, and guidance.**

## 2024-04-09 ENCOUNTER — Ambulatory Visit: Payer: Medicaid Other | Admitting: Neurology

## 2024-05-21 NOTE — Progress Notes (Unsigned)
 NEUROLOGY FOLLOW UP OFFICE NOTE  Deanna Myers [id number]  Assessment/Plan:   Migraine with aura, without status migrainosus, intractable   She has had an increase in frequency, probably due to recent stressors.   Migraine prevention:  Aimovig  140mg  *** Migraine rescue:  Nurtec and sumatriptan  20mg  NS, but she may try taking the sumatriptan  as first line to see if she gets quicker relief. Zofran  to 8mg . *** Limit use of pain relievers to no more than 2 days out of week to prevent risk of rebound or medication-overuse headache. Keep headache diary Follow up 6 months.       Subjective:  Deanna Myers is a 33 year old female with ADHD and asthma who follows up for migraines.  UPDATE: PRegant ?  *** Notes increased frequency since the new year.  Increased stress. Nausea is worse.   Intensity:  severe  Duration:  usually 4 hours to rest of day with Nurtec and nap, but if needs to take sumatriptan  NS with nap for 4-6 hours.    Frequency:  5 days a month (previously 4 a month) Current NSAIDS:  meloxicam , ibuprofen  Current analgesics:  no Current triptans: sumatriptan  20mg  NS Current ergotamine:  no Current anti-emetic:  ondansetron  4mg  Current muscle relaxants:  none Current anti-anxiolytic:  no Current sleep aide:  no Current Antihypertensive medications:  no Current Antidepressant medications:  no Current Anticonvulsant medications:  none Current anti-CGRP:  Nurtec Current Vitamins/Herbal/Supplements: black seed oil Current Antihistamines/Decongestants:  none Other therapy:  cool compresses, baths with Epson salts Other medication:  no  Caffeine:  3 Energy drinks daily.  Occasional soda or coffee Alcohol:  occasional Smoker:  no Diet:  Hydrates with water.  Cut out Gatorade.   Exercise:  yes Depression:  no; Anxiety:  yes Other pain:  no Sleep hygiene:  Poor since recent stress.    HISTORY:  Feels headaches getting worse. Onset:  2015, following her first  pregnancy.  Infrequent until this past year. Location:  Varies (frontal, parietal, occipital) Quality:  Undulating, pulsating, stabbing Initial intensity:  severe.  She denies new headache, thunderclap headache or severe headache that wakes her from sleep. Aura:  Monocular vision loss/blurred vision in either eye Prodrome: no Postdrome:  no Associated symptoms:  Nausea, photophobia, phonophobia, sometimes vomiting, hypersensitivity, head/neck/upper torso aching  She denies associated unilateral numbness or weakness. Initial Duration:  4 days (3 days gradual buildup) Initial Frequency:  1 times a month Initial Frequency of abortive medication: as needed Triggers/aggravating factors:  Unknown Relieving factors:  Laying down in dark Activity:  aggravates   CT head on 04/25/2012 was normal.      Past NSAIDS:  Ibuprofen , naproxen  Past analgesics:  Excedrin, Tylenol , Extra-strength Tylenol  Past abortive triptans:  sumatriptan  100mg , rizatriptan  Past muscle relaxants:  Robaxin , tizanidine  Past anti-emetic:  Zofran  4mg  Past antihypertensive medications:  no Past antidepressant medications:  no Past anticonvulsant medications:  topiramate , gabapentin  Past vitamins/Herbal/Supplements:  no Past antihistamines/decongestants:  no Other past therapies:  no      Family history of headache:  Mother had migraines  PAST MEDICAL HISTORY: Past Medical History:  Diagnosis Date   ADHD (attention deficit hyperactivity disorder)    since childhood   Asthma    inhaler last used prior to pregnancy   BV (bacterial vaginosis)    Chlamydia    GERD (gastroesophageal reflux disease)    Gonorrhea    Headache    Hyperemesis    G3    MEDICATIONS: Current Outpatient Medications on  File Prior to Visit  Medication Sig Dispense Refill   Doxylamine -Pyridoxine  (DICLEGIS ) 10-10 MG TBEC 2 tabs at bedtime. Can add 1 tab in the morning & 1 tab in the afternoon if needed (Patient not taking: Reported on  01/19/2024) 100 tablet 1   glycopyrrolate  (ROBINUL ) 1 MG tablet Take 1 tablet (1 mg total) by mouth 3 (three) times daily. 90 tablet 0   glycopyrrolate  (ROBINUL ) 1 MG tablet Take 1 tablet (1 mg total) by mouth 3 (three) times daily. 90 tablet 0   methylPREDNISolone  (MEDROL ) 4 MG tablet Take 4 to 1 tablets daily as, outlined in orders, for 14 days. 81 tablet 0   ondansetron  (ZOFRAN -ODT) 4 MG disintegrating tablet Take 1-2 tablets (4-8 mg total) by mouth every 8 (eight) hours as needed for nausea or vomiting. 30 tablet 1   promethazine  (PHENERGAN ) 25 MG suppository Place 1 suppository (25 mg total) rectally every 6 (six) hours as needed for nausea or vomiting. 12 each 0   promethazine  (PHENERGAN ) 25 MG tablet Take 1 tablet (25 mg total) by mouth every 6 (six) hours as needed for nausea or vomiting. 45 tablet 1   scopolamine  (TRANSDERM-SCOP) 1 MG/3DAYS Place 1 patch (1.5 mg total) onto the skin every 3 (three) days. 10 patch 0   [DISCONTINUED] albuterol  (PROVENTIL  HFA;VENTOLIN  HFA) 108 (90 BASE) MCG/ACT inhaler Inhale 1-2 puffs into the lungs every 6 (six) hours as needed for wheezing or shortness of breath.     [DISCONTINUED] omeprazole  (PRILOSEC) 20 MG capsule Take 1 capsule (20 mg total) by mouth 2 (two) times daily. 60 capsule 1   [DISCONTINUED] SUMAtriptan  (IMITREX ) 100 MG tablet Take 1 tablet earliest onset of migraine.  May repeat once in 2 hours if headache persists or recurs.  Do not exceed 2 tablets in 24 hours 10 tablet 2   No current facility-administered medications on file prior to visit.     ALLERGIES: Allergies  Allergen Reactions   Shellfish Allergy Anaphylaxis   Adhesive [Tape] Swelling and Other (See Comments)    Reaction:  Bruising    Latex Hives   Pork-Derived Products    Betadine [Povidone Iodine] Rash    FAMILY HISTORY: Family History  Problem Relation Age of Onset   Hypertension Father    Hypertension Mother    Kidney disease Mother    Cervical cancer Paternal  Aunt    Congestive Heart Failure Maternal Grandmother    Breast cancer Paternal Grandfather       Objective:  *** General: No acute distress.  Patient appears well-groomed.   ***    Juliene Dunnings, DO  CC: Bascom Borer, NP

## 2024-05-23 ENCOUNTER — Encounter: Payer: Self-pay | Admitting: Neurology

## 2024-05-23 ENCOUNTER — Other Ambulatory Visit (HOSPITAL_COMMUNITY): Payer: Self-pay

## 2024-05-23 ENCOUNTER — Telehealth: Payer: Self-pay

## 2024-05-23 ENCOUNTER — Ambulatory Visit: Admitting: Neurology

## 2024-05-23 VITALS — BP 136/66 | HR 98 | Ht 66.0 in

## 2024-05-23 DIAGNOSIS — G43009 Migraine without aura, not intractable, without status migrainosus: Secondary | ICD-10-CM | POA: Diagnosis not present

## 2024-05-23 MED ORDER — AIMOVIG 140 MG/ML ~~LOC~~ SOAJ: 140.0000 mg | SUBCUTANEOUS | 11 refills | Status: AC

## 2024-05-23 MED ORDER — NURTEC 75 MG PO TBDP: 75.0000 mg | ORAL_TABLET | ORAL | 5 refills | Status: AC | PRN

## 2024-05-23 MED ORDER — SUMATRIPTAN 20 MG/ACT NA SOLN: 20.0000 mg | NASAL | 5 refills | Status: AC | PRN

## 2024-05-23 NOTE — Telephone Encounter (Signed)
 Pharmacy Patient Advocate Encounter  Received notification from [facility] that Prior Authorization for Aimovig  140MG /ML auto-injectors has been APPROVED from 05-23-2024 to 08-21-2024   PA #/Case ID/Reference #: [id number]

## 2024-05-23 NOTE — Telephone Encounter (Signed)
 Pharmacy Patient Advocate Encounter   Received notification from Pt Calls Messages that prior authorization for Nurtec 75MG  dispersible tablets is required/requested.   Insurance verification completed.   The patient is insured through [facility] .   Per test claim: The current 16 quantity for 30 day co-pay is, $0.00.  No PA needed at this time. This test claim was processed through [facility]- copay amounts may vary at other pharmacies due to pharmacy/plan contracts, or as the patient moves through the different stages of their insurance plan.

## 2024-05-23 NOTE — Telephone Encounter (Signed)
 Per Dr.jaffe patient to restart Nurtec/Aimovig .  PA team can we check to see if theses have PA's and if they are still good.

## 2024-05-23 NOTE — Telephone Encounter (Signed)
 Pharmacy Patient Advocate Encounter   Received notification from Pt Calls Messages that prior authorization for Aimovig  140MG /ML auto-injectors is required/requested.   Insurance verification completed.   The patient is insured through [facility] .   Per test claim: PA required; PA submitted to above mentioned insurance via Latent Key/confirmation #/EOC [id number] Status is pending

## 2024-05-30 ENCOUNTER — Ambulatory Visit: Payer: Medicaid Other | Admitting: Neurology

## 2024-08-10 ENCOUNTER — Ambulatory Visit
Admission: EM | Admit: 2024-08-10 | Discharge: 2024-08-10 | Disposition: A | Discharge status: Home / Self Care | Attending: Family Medicine | Admitting: Family Medicine

## 2024-08-10 DIAGNOSIS — O210 Mild hyperemesis gravidarum: Secondary | ICD-10-CM | POA: Insufficient documentation

## 2024-08-10 DIAGNOSIS — J45909 Unspecified asthma, uncomplicated: Secondary | ICD-10-CM | POA: Insufficient documentation

## 2024-08-10 DIAGNOSIS — B9689 Other specified bacterial agents as the cause of diseases classified elsewhere: Secondary | ICD-10-CM | POA: Insufficient documentation

## 2024-08-10 DIAGNOSIS — L03315 Cellulitis of perineum: Secondary | ICD-10-CM

## 2024-08-10 DIAGNOSIS — R112 Nausea with vomiting, unspecified: Secondary | ICD-10-CM | POA: Insufficient documentation

## 2024-08-10 DIAGNOSIS — O26649 Intrahepatic cholestasis of pregnancy, unspecified trimester: Secondary | ICD-10-CM | POA: Insufficient documentation

## 2024-08-10 DIAGNOSIS — N912 Amenorrhea, unspecified: Secondary | ICD-10-CM | POA: Insufficient documentation

## 2024-08-10 DIAGNOSIS — L282 Other prurigo: Secondary | ICD-10-CM | POA: Insufficient documentation

## 2024-08-10 MED ORDER — KETOROLAC TROMETHAMINE 15 MG/ML IJ SOLN
30.0000 mg | Freq: Once | INTRAMUSCULAR | Status: AC
  Administered 2024-08-10: 30 mg via INTRAMUSCULAR

## 2024-08-10 MED ORDER — KETOROLAC TROMETHAMINE 30 MG/ML IJ SOLN: 30.0000 mg | Freq: Once | INTRAMUSCULAR | Status: DC

## 2024-08-10 MED ORDER — AMOXICILLIN-POT CLAVULANATE 875-125 MG PO TABS: 1.0000 | ORAL_TABLET | Freq: Two times a day (BID) | ORAL | 0 refills | Status: AC

## 2024-08-10 MED ORDER — KETOROLAC TROMETHAMINE 10 MG PO TABS
10.0000 mg | ORAL_TABLET | Freq: Four times a day (QID) | ORAL | 0 refills | Status: AC | PRN
Start: 2024-08-10 — End: ?

## 2024-08-10 NOTE — ED Provider Notes (Signed)
 [name]    CSN: [id number] Arrival date & time: 08/10/24  1835      History   Chief Complaint Chief Complaint  Patient presents with   Skin Problem    HPI Deanna Myers is a 33 y.o. female.   HPI Here for pain and swelling in her left labia majora and minora.  She states it began bothering her about 3 days ago and is gotten more painful.  No fever or chills  No drainage  She is allergic to latex and adhesive and iodine  She also notes a history of a right ovarian cyst.  She states it is not hurting her at this time.  She has not gotten a follow-up with her OB/GYN as she states her job is very hectic.  Last menstrual cycle was October 24 Past Medical History:  Diagnosis Date   Active labor at term 12/21/2015   ADHD (attention deficit hyperactivity disorder)    since childhood   Asthma    inhaler last used prior to pregnancy   BV (bacterial vaginosis)    Chlamydia    Folliculitis 02/13/2013   Chronic.  Mons pubis.     GERD (gastroesophageal reflux disease)    Gonorrhea    Headache    Hyperemesis    G3   Pyelonephritis affecting pregnancy in first trimester 06/12/2015   UTI (urinary tract infection) 06/13/2015    Patient Active Problem List   Diagnosis Date Noted   Pruritic rash 08/10/2024   Nausea and vomiting 08/10/2024   Morning sickness 08/10/2024   Cholestasis of pregnancy 08/10/2024   Bacterial vaginosis 08/10/2024   Asthma 08/10/2024   Amenorrhea 08/10/2024   Breast engorgement 01/29/2024   Abscess of labia majora 01/29/2024   Pain, dental 08/02/2023   Vaginal discharge 06/28/2023   Screening examination for STI 06/28/2023   Lymphedema 06/19/2023   Osteoarthritis 06/19/2023   Edema of lower extremity 02/16/2023   Lower extremity edema 12/27/2022   Right foot pain 11/15/2022   Acute cough 10/22/2022   Pain, joint, pelvic region, left 08/13/2022   Left leg pain 06/30/2022   Healthcare maintenance 04/01/2022   Breast lump  05/18/2021    Past Surgical History:  Procedure Laterality Date   CESAREAN SECTION N/A 12/21/2015   Procedure: CESAREAN SECTION;  Surgeon: Nathanel Bunker, MD;  Location: [facility];  Service: Obstetrics;  Laterality: N/A;   LAPAROSCOPIC APPENDECTOMY N/A 12/21/2021   Procedure: APPENDECTOMY LAPAROSCOPIC;  Surgeon: Dasie Leonor CROME, MD;  Location: [facility];  Service: General;  Laterality: N/A;    OB History     Gravida  4   Para  2   Term  2   Preterm      AB  1   Living  2      SAB  1   IAB      Ectopic      Multiple  0   Live Births  2            Home Medications    Prior to Admission medications   Medication Sig Start Date End Date Taking? Authorizing Provider  amoxicillin -clavulanate (AUGMENTIN ) 875-125 MG tablet Take 1 tablet by mouth 2 (two) times daily for 7 days. 08/10/24 08/17/24 Yes BanisterSharlet POUR, MD  HYDROcodone -acetaminophen  (NORCO/VICODIN) 5-325 MG tablet Take 1 tablet by mouth every 6 (six) hours as needed. 07/16/24  Yes [provider]  ketorolac  (TORADOL ) 10 MG tablet Take 1 tablet (10 mg total) by mouth every 6 (  six) hours as needed (pain). 08/10/24  Yes Vonna Sharlet POUR, MD  acetaminophen  (TYLENOL ) 500 MG tablet Take 1,000 mg by mouth.    [provider]  clindamycin  (CLEOCIN ) 300 MG capsule Take 300 mg by mouth as directed.    [provider]  dicyclomine  (BENTYL ) 10 MG capsule Take 10 mg by mouth as directed.    [provider]  Erenumab -aooe (AIMOVIG ) 140 MG/ML SOAJ Inject 140 mg into the skin every 30 (thirty) days. 05/23/24   Skeet Juliene SAUNDERS, DO  furosemide  (LASIX ) 20 MG tablet Take 20 mg by mouth daily.    [provider]  gabapentin  (NEURONTIN ) 100 MG capsule Take 100 mg by mouth as directed.    [provider]  glycopyrrolate  (ROBINUL ) 1 MG tablet Take 1 tablet (1 mg total) by mouth 3 (three) times daily. 02/28/24   Synthia Raisin, CNM  glycopyrrolate  (ROBINUL ) 1 MG tablet Take 1 tablet (1  mg total) by mouth 3 (three) times daily. 02/28/24   Synthia Raisin, CNM  guanFACINE (INTUNIV) 2 MG TB24 ER tablet Take 2 mg by mouth daily.    [provider]  levocetirizine (XYZAL ) 5 MG tablet Take 5 mg by mouth every evening.    [provider]  meloxicam  (MOBIC ) 15 MG tablet Take 15 mg by mouth daily.    [provider]  methocarbamol  (ROBAXIN ) 500 MG tablet Take 500 mg by mouth every 6 (six) hours as needed for muscle spasms.    [provider]  Rimegepant Sulfate (NURTEC) 75 MG TBDP     [provider]  rizatriptan  (MAXALT ) 10 MG tablet Take 10 mg by mouth as needed.    [provider]  scopolamine  (TRANSDERM-SCOP) 1 MG/3DAYS Place 1 patch (1.5 mg total) onto the skin every 3 (three) days. 02/09/24   Cresenzo, John V, MD  SUMAtriptan  (IMITREX ) 20 MG/ACT nasal spray Place 1 spray (20 mg total) into the nose as needed for migraine or headache. May repeat in 2 hours if headache persists or recurs.  Maximum doses in 24 hours. 05/23/24   Skeet Juliene SAUNDERS, DO  albuterol  (PROVENTIL  HFA;VENTOLIN  HFA) 108 (90 BASE) MCG/ACT inhaler Inhale 1-2 puffs into the lungs every 6 (six) hours as needed for wheezing or shortness of breath.  05/21/20  [provider]  omeprazole  (PRILOSEC) 20 MG capsule Take 1 capsule (20 mg total) by mouth 2 (two) times daily. 07/03/17 09/18/19  Lynwood Anes, MD  SUMAtriptan  (IMITREX ) 100 MG tablet Take 1 tablet earliest onset of migraine.  May repeat once in 2 hours if headache persists or recurs.  Do not exceed 2 tablets in 24 hours 02/10/18 05/21/20  Skeet Juliene SAUNDERS, DO    Family History Family History  Problem Relation Age of Onset   Hypertension Father    Hypertension Mother    Kidney disease Mother    Cervical cancer Paternal Aunt    Congestive Heart Failure Maternal Grandmother    Breast cancer Paternal Grandfather     Social History Social History   Tobacco Use   Smoking status: Never   Smokeless tobacco: Never   Vaping Use   Vaping status: Never Used  Substance Use Topics   Alcohol use: Not Currently    Comment: social   Drug use: No     Allergies   Latex, Shellfish allergy, Wound dressing adhesive, Adhesive [tape], Porcine (pork) protein-containing drug products, and Povidone iodine   Review of Systems Review of Systems   Physical Exam Triage Vital  Signs ED Triage Vitals  Encounter Vitals Group     BP 08/10/24 1903 (!) 127/90     Girls Systolic BP Percentile --      Girls Diastolic BP Percentile --      Boys Systolic BP Percentile --      Boys Diastolic BP Percentile --      Pulse Rate 08/10/24 1903 87     Resp 08/10/24 1903 18     Temp 08/10/24 1903 98.2 F (36.8 C)     Temp Source 08/10/24 1903 Oral     SpO2 08/10/24 1903 97 %     Weight 08/10/24 1901 246 lb (111.6 kg)     Height 08/10/24 1901 5' 6 (1.676 m)     Head Circumference --      Peak Flow --      Pain Score 08/10/24 1854 8     Pain Loc --      Pain Education --      Exclude from Growth Chart --    No data found.  Updated Vital Signs BP (!) 127/90 (BP Location: Left Arm)   Pulse 87   Temp 98.2 F (36.8 C) (Oral)   Resp 18   Ht 5' 6 (1.676 m)   Wt 111.6 kg   LMP 07/27/2024 (Exact Date)   SpO2 97%   Breastfeeding Unknown   BMI 39.71 kg/m   Visual Acuity Right Eye Distance:   Left Eye Distance:   Bilateral Distance:    Right Eye Near:   Left Eye Near:    Bilateral Near:     Physical Exam Vitals reviewed.  Constitutional:      Appearance: She is not toxic-appearing.     Comments: In some obvious discomfort  HENT:     Mouth/Throat:     Mouth: Mucous membranes are moist.  Eyes:     Extraocular Movements: Extraocular movements intact.     Conjunctiva/sclera: Conjunctivae normal.     Pupils: Pupils are equal, round, and reactive to light.  Cardiovascular:     Rate and Rhythm: Normal rate and regular rhythm.  Abdominal:     Palpations: Abdomen is soft.     Tenderness: There is no  abdominal tenderness.  Genitourinary:    Comments: Chaperone is present at the time of exam.  There is very minimal induration of the left labia majora.  I cannot really discern any significant swelling where the pain shows me she is hurting.  There is no fluctuance.  There is no discharge from any area. Musculoskeletal:     Cervical back: Neck supple.  Lymphadenopathy:     Cervical: No cervical adenopathy.  Skin:    Coloration: Skin is not pale.  Neurological:     General: No focal deficit present.     Mental Status: She is alert and oriented to person, place, and time.  Psychiatric:        Behavior: Behavior normal.      UC Treatments / Results  Labs (all labs ordered are listed, but only abnormal results are displayed) Labs Reviewed - No data to display  EKG   Radiology No results found.  Procedures Procedures (including critical care time)  Medications Ordered in UC Medications  ketorolac  (TORADOL ) 15 MG/ML injection 30 mg (30 mg Intramuscular Given 08/10/24 1950)    Initial Impression / Assessment and Plan / UC Course  I have reviewed the triage vital signs and the nursing notes.  Pertinent labs & imaging results that were  available during my care of the patient were reviewed by me and considered in my medical decision making (see chart for details).     Augmentin  is sent in for possible cellulitis.  Toradol  injection is given and Toradol  tablets are sent to the pharmacy.  I have asked to go the emergency room if she worsens or does not improve.  I have asked her to follow-up with her GYN about the ovarian cyst. Final Clinical Impressions(s) / UC Diagnoses   Final diagnoses:  Cellulitis of perineum     Discharge Instructions      You have been given a shot of Toradol  30 mg today.  Take amoxicillin -clavulanate 875 mg--1 tab twice daily with food for 7 days  Ketorolac  10 mg tablets--take 1 tablet every 6 hours as needed for pain.  This is the same  medicine that is in the shot we just gave you  Warm compresses or sitz baths.  Please followup with your gyn about the history of right ovarian cyst.   If your pain does not improve in the next 48 hours, or if you worsen in anyway, go to the ER     ED Prescriptions     Medication Sig Dispense Auth. Provider   amoxicillin -clavulanate (AUGMENTIN ) 875-125 MG tablet Take 1 tablet by mouth 2 (two) times daily for 7 days. 14 tablet Anari Evitt, Sharlet POUR, MD   ketorolac  (TORADOL ) 10 MG tablet Take 1 tablet (10 mg total) by mouth every 6 (six) hours as needed (pain). 20 tablet Talayah Picardi K, MD      PDMP not reviewed this encounter.   Vonna Sharlet POUR, MD 08/10/24 [phone]

## 2024-08-10 NOTE — Discharge Instructions (Signed)
 You have been given a shot of Toradol  30 mg today.  Take amoxicillin -clavulanate 875 mg--1 tab twice daily with food for 7 days  Ketorolac  10 mg tablets--take 1 tablet every 6 hours as needed for pain.  This is the same medicine that is in the shot we just gave you  Warm compresses or sitz baths.  Please followup with your gyn about the history of right ovarian cyst.   If your pain does not improve in the next 48 hours, or if you worsen in anyway, go to the ER

## 2024-08-10 NOTE — ED Triage Notes (Signed)
 Patient reports having a cyst in her ovary (to discuss) and vaginal pain/swelling (left side). No fever.

## 2024-08-10 NOTE — ED Notes (Signed)
 This RN present for vaginal exam by P. Banister, MD. Pt tolerated without issue.

## 2024-09-03 ENCOUNTER — Ambulatory Visit: Admission: EM | Admit: 2024-09-03 | Discharge: 2024-09-03 | Disposition: A | Discharge status: Home / Self Care

## 2024-09-03 DIAGNOSIS — Z113 Encounter for screening for infections with a predominantly sexual mode of transmission: Secondary | ICD-10-CM | POA: Diagnosis not present

## 2024-09-03 DIAGNOSIS — R3915 Urgency of urination: Secondary | ICD-10-CM | POA: Diagnosis present

## 2024-09-03 DIAGNOSIS — E860 Dehydration: Secondary | ICD-10-CM | POA: Diagnosis not present

## 2024-09-03 DIAGNOSIS — R300 Dysuria: Secondary | ICD-10-CM | POA: Diagnosis not present

## 2024-09-03 DIAGNOSIS — R1030 Lower abdominal pain, unspecified: Secondary | ICD-10-CM | POA: Diagnosis present

## 2024-09-03 DIAGNOSIS — N3000 Acute cystitis without hematuria: Secondary | ICD-10-CM | POA: Diagnosis not present

## 2024-09-03 LAB — POCT URINE DIPSTICK
Bilirubin, UA: NEGATIVE
Glucose, UA: NEGATIVE mg/dL
Ketones, POC UA: NEGATIVE mg/dL
Nitrite, UA: NEGATIVE
POC PROTEIN,UA: 100 — AB
Spec Grav, UA: >=1.03 — AB (ref 1.010–1.025)
Urobilinogen, UA: 0.2 U/dL
pH, UA: 6.5 (ref 5.0–8.0)

## 2024-09-03 LAB — POCT URINE PREGNANCY: Preg Test, Ur: NEGATIVE

## 2024-09-03 MED ORDER — SULFAMETHOXAZOLE-TRIMETHOPRIM 800-160 MG PO TABS: 1.0000 | ORAL_TABLET | Freq: Two times a day (BID) | ORAL | 0 refills | Status: AC

## 2024-09-03 NOTE — Discharge Instructions (Addendum)
 Your urine sample showed that you are dehydrated today as well as having a urinary tract infection.  Take the Bactrim  twice daily with food for the next 7 days.  If the pill is hard to swallow you can take it with Jell-O or applesauce.  Please take all of your antibiotics.  This will treat a urinary tract infection and any abscess in the groin.  Ensure you are drinking at least 64 ounces of water daily to help flush the kidneys.  We are checking for vaginal infections as well as sexually transmitted infections.  Results to be available over the next few days on your MyChart.  Staff will contact if treatment is needed based on results.  Abstain from intercourse until all results have been received.    Return to clinic for any new or urgent symptoms.

## 2024-09-03 NOTE — ED Provider Notes (Signed)
 [name]    CSN: [id number] Arrival date & time: 09/03/24  1715      History   Chief Complaint Chief Complaint  Patient presents with   UTI Symptoms    HPI Deanna Myers is a 33 y.o. female.   Patient presents to clinic over concern of urinary urgency, frequency and small amounts of urination that has been ongoing for the past few weeks.  Her mother recently passed that she only just now has time to get to the doctors.  She has not had dysuria or hematuria.  Mild lower abdominal discomfort.  Denies current nausea.  Did have 1 episode of vomiting this morning, reports she tried an old antibiotic on an empty stomach.  Has not had any fevers.  She would like STI screening as well as BV and yeast vaginitis.  Would like HIV and syphilis screening as well.  The history is provided by the patient and medical records.    Past Medical History:  Diagnosis Date   Active labor at term 12/21/2015   ADHD (attention deficit hyperactivity disorder)    since childhood   Asthma    inhaler last used prior to pregnancy   BV (bacterial vaginosis)    Chlamydia    Folliculitis 02/13/2013   Chronic.  Mons pubis.     GERD (gastroesophageal reflux disease)    Gonorrhea    Headache    Hyperemesis    G3   Pyelonephritis affecting pregnancy in first trimester 06/12/2015   UTI (urinary tract infection) 06/13/2015    Patient Active Problem List   Diagnosis Date Noted   Pruritic rash 08/10/2024   Nausea and vomiting 08/10/2024   Morning sickness 08/10/2024   Cholestasis of pregnancy 08/10/2024   Bacterial vaginosis 08/10/2024   Asthma 08/10/2024   Amenorrhea 08/10/2024   Breast engorgement 01/29/2024   Abscess of labia majora 01/29/2024   Pain, dental 08/02/2023   Vaginal discharge 06/28/2023   Screening examination for STI 06/28/2023   Lymphedema 06/19/2023   Osteoarthritis 06/19/2023   Edema of lower extremity 02/16/2023   Lower extremity edema 12/27/2022   Right  foot pain 11/15/2022   Acute cough 10/22/2022   Pain, joint, pelvic region, left 08/13/2022   Left leg pain 06/30/2022   Healthcare maintenance 04/01/2022   Breast lump 05/18/2021    Past Surgical History:  Procedure Laterality Date   CESAREAN SECTION N/A 12/21/2015   Procedure: CESAREAN SECTION;  Surgeon: Nathanel Bunker, MD;  Location: [facility];  Service: Obstetrics;  Laterality: N/A;   LAPAROSCOPIC APPENDECTOMY N/A 12/21/2021   Procedure: APPENDECTOMY LAPAROSCOPIC;  Surgeon: Dasie Leonor CROME, MD;  Location: [facility];  Service: General;  Laterality: N/A;    OB History     Gravida  4   Para  2   Term  2   Preterm      AB  1   Living  2      SAB  1   IAB      Ectopic      Multiple  0   Live Births  2            Home Medications    Prior to Admission medications   Medication Sig Start Date End Date Taking? Authorizing Provider  Amoxicillin -Pot Clavulanate (AUGMENTIN  PO) Take by mouth.   Yes [provider]  ondansetron  (ZOFRAN -ODT) 8 MG disintegrating tablet Place 1 tablet 3 times a day by translingual route as needed. 02/27/24  Yes [provider]  promethazine  (PHENERGAN ) 25 MG suppository UNWRAP AND INSERT 1 SUPPOSITORY RECTALLY EVERY 6 HOURS AS NEEDED FOR NAUSEA OR VOMITING 02/27/24  Yes [provider]  sulfamethoxazole -trimethoprim  (BACTRIM  DS) 800-160 MG tablet Take 1 tablet by mouth 2 (two) times daily for 7 days. 09/03/24 09/10/24 Yes Brea Coleson  N, FNP  acetaminophen  (TYLENOL ) 500 MG tablet Take 1,000 mg by mouth.    [provider]  clindamycin  (CLEOCIN ) 300 MG capsule Take 300 mg by mouth as directed.    [provider]  dicyclomine  (BENTYL ) 10 MG capsule Take 10 mg by mouth as directed.    [provider]  Erenumab -aooe (AIMOVIG ) 140 MG/ML SOAJ Inject 140 mg into the skin every 30 (thirty) days. 05/23/24   Skeet Juliene SAUNDERS, DO  furosemide  (LASIX ) 20 MG tablet Take 20 mg by mouth daily.    [provider]  gabapentin  (NEURONTIN ) 100 MG capsule Take 100 mg by mouth as directed.    [provider]  glycopyrrolate  (ROBINUL ) 1 MG tablet Take 1 tablet (1 mg total) by mouth 3 (three) times daily. 02/28/24   Synthia Raisin, CNM  glycopyrrolate  (ROBINUL ) 1 MG tablet Take 1 tablet (1 mg total) by mouth 3 (three) times daily. 02/28/24   Synthia Raisin, CNM  guanFACINE (INTUNIV) 2 MG TB24 ER tablet Take 2 mg by mouth daily.    [provider]  HYDROcodone -acetaminophen  (NORCO/VICODIN) 5-325 MG tablet Take 1 tablet by mouth every 6 (six) hours as needed. 07/16/24   [provider]  ketorolac  (TORADOL ) 10 MG tablet Take 1 tablet (10 mg total) by mouth every 6 (six) hours as needed (pain). 08/10/24   Banister, Pamela K, MD  levocetirizine (XYZAL ) 5 MG tablet Take 5 mg by mouth every evening.    [provider]  meloxicam  (MOBIC ) 15 MG tablet Take 15 mg by mouth daily.    [provider]  methocarbamol  (ROBAXIN ) 500 MG tablet Take 500 mg by mouth every 6 (six) hours as needed for muscle spasms.    [provider]  Rimegepant Sulfate (NURTEC) 75 MG TBDP     [provider]  rizatriptan  (MAXALT ) 10 MG tablet Take 10 mg by mouth as needed.    [provider]  scopolamine  (TRANSDERM-SCOP) 1 MG/3DAYS Place 1 patch (1.5 mg total) onto the skin every 3 (three) days. 02/09/24   Cresenzo, John V, MD  SUMAtriptan  (IMITREX ) 20 MG/ACT nasal spray Place 1 spray (20 mg total) into the nose as needed for migraine or headache. May repeat in 2 hours if headache persists or recurs.  Maximum doses in 24 hours. 05/23/24   Skeet, Adam R, DO  SUMAtriptan  (IMITREX ) 20 MG/ACT nasal spray PLACE 1 SPRAY INTO THE NOSE AS NEEDED FOR MIGRANE OR HEADACHE. MAY REPEAT IN 2 HOURS AS NEEDED. MAXIMUM DOSES FOR 24 HRS    [provider]  albuterol  (PROVENTIL  HFA;VENTOLIN  HFA) 108 (90 BASE) MCG/ACT inhaler Inhale 1-2 puffs into the lungs every 6 (six) hours as needed  for wheezing or shortness of breath.  05/21/20  [provider]  omeprazole  (PRILOSEC) 20 MG capsule Take 1 capsule (20 mg total) by mouth 2 (two) times daily. 07/03/17 09/18/19  Lynwood Anes, MD  SUMAtriptan  (IMITREX ) 100 MG tablet Take 1 tablet earliest onset of migraine.  May repeat once in 2 hours if headache persists or recurs.  Do not exceed 2 tablets in 24 hours 02/10/18 05/21/20  Skeet Juliene SAUNDERS, DO    Family History Family History  Problem Relation  Age of Onset   Hypertension Father    Hypertension Mother    Kidney disease Mother    Cervical cancer Paternal Aunt    Congestive Heart Failure Maternal Grandmother    Breast cancer Paternal Grandfather     Social History Social History   Tobacco Use   Smoking status: Never   Smokeless tobacco: Never  Vaping Use   Vaping status: Never Used  Substance Use Topics   Alcohol use: Not Currently    Comment: social   Drug use: No     Allergies   Latex, Shellfish allergy, Wound dressing adhesive, Adhesive [tape], Porcine (pork) protein-containing drug products, and Povidone iodine   Review of Systems Review of Systems  Per HPI  Physical Exam Triage Vital Signs ED Triage Vitals  Encounter Vitals Group     BP 09/03/24 1835 106/71     Girls Systolic BP Percentile --      Girls Diastolic BP Percentile --      Boys Systolic BP Percentile --      Boys Diastolic BP Percentile --      Pulse Rate 09/03/24 1835 84     Resp 09/03/24 1835 18     Temp 09/03/24 1835 97.8 F (36.6 C)     Temp Source 09/03/24 1835 Oral     SpO2 09/03/24 1835 100 %     Weight 09/03/24 1833 253 lb (114.8 kg)     Height 09/03/24 1833 5' 6 (1.676 m)     Head Circumference --      Peak Flow --      Pain Score 09/03/24 1831 6     Pain Loc --      Pain Education --      Exclude from Growth Chart --    No data found.  Updated Vital Signs BP 106/71 (BP Location: Left Arm)   Pulse 84   Temp 97.8 F (36.6 C) (Oral)   Resp 18   Ht 5' 6  (1.676 m)   Wt 253 lb (114.8 kg)   LMP 08/24/2024 (Exact Date)   SpO2 100%   BMI 40.84 kg/m   Visual Acuity Right Eye Distance:   Left Eye Distance:   Bilateral Distance:    Right Eye Near:   Left Eye Near:    Bilateral Near:     Physical Exam Vitals and nursing note reviewed.  Constitutional:      Appearance: Normal appearance.  HENT:     Head: Normocephalic and atraumatic.     Right Ear: External ear normal.     Left Ear: External ear normal.     Nose: Nose normal.     Mouth/Throat:     Mouth: Mucous membranes are moist.  Eyes:     Conjunctiva/sclera: Conjunctivae normal.  Cardiovascular:     Rate and Rhythm: Normal rate.  Pulmonary:     Effort: Pulmonary effort is normal. No respiratory distress.  Abdominal:     General: Abdomen is flat. Bowel sounds are normal.     Palpations: Abdomen is soft.     Tenderness: There is abdominal tenderness. There is no guarding.  Musculoskeletal:        General: Normal range of motion.  Skin:    General: Skin is warm and dry.      Neurological:     General: No focal deficit present.     Mental Status: She is alert and oriented to person, place, and time.  Psychiatric:  Mood and Affect: Mood normal.        Behavior: Behavior normal.      UC Treatments / Results  Labs (all labs ordered are listed, but only abnormal results are displayed) Labs Reviewed  POCT URINE DIPSTICK - Abnormal; Notable for the following components:      Result Value   Clarity, UA cloudy (*)    Spec Grav, UA >=1.030 (*)    Blood, UA small (*)    POC PROTEIN,UA =100 (*)    Leukocytes, UA Trace (*)    All other components within normal limits  POCT URINE PREGNANCY - Normal  URINE CULTURE  HIV ANTIBODY (ROUTINE TESTING W REFLEX)  SYPHILIS: RPR W/REFLEX TO RPR TITER AND TREPONEMAL ANTIBODIES, TRADITIONAL SCREENING AND DIAGNOSIS ALGORITHM  CERVICOVAGINAL ANCILLARY ONLY    EKG   Radiology No results found.  Procedures Procedures  (including critical care time)  Medications Ordered in UC Medications - No data to display  Initial Impression / Assessment and Plan / UC Course  I have reviewed the triage vital signs and the nursing notes.  Pertinent labs & imaging results that were available during my care of the patient were reviewed by me and considered in my medical decision making (see chart for details).  Vitals and triage reviewed, patient is hemodynamically stable.  Abdomen is soft, mild tenderness in the left lower quadrant, without rebound or guarding.  Active bowel sounds.  Low concern for acute abdomen.  Urinary frequency with red blood cells trace leuks in UA, will treat for acute cystitis and send urine for culture.  STI testing obtained, patient also concern for potential BV or yeast.  Urine pregnancy is negative.  Patient has an area to the mons pubis that she is concerned about, was initially an abscess months ago.  The Bactrim  should cover for any infection, will extend from 3 days typically for acute cystitis to 7 days for any abscess coverage.  Plan of care, follow-up care return precautions given, no questions at this time.    Final Clinical Impressions(s) / UC Diagnoses   Final diagnoses:  Dysuria  Encounter for screening examination for sexually transmitted infection  Acute cystitis without hematuria     Discharge Instructions      Your urine sample showed that you are dehydrated today as well as having a urinary tract infection.  Take the Bactrim  twice daily with food for the next 7 days.  If the pill is hard to swallow you can take it with Jell-O or applesauce.  Please take all of your antibiotics.  This will treat a urinary tract infection and any abscess in the groin.  Ensure you are drinking at least 64 ounces of water daily to help flush the kidneys.  We are checking for vaginal infections as well as sexually transmitted infections.  Results to be available over the next few days on  your MyChart.  Staff will contact if treatment is needed based on results.  Abstain from intercourse until all results have been received.    Return to clinic for any new or urgent symptoms.     ED Prescriptions     Medication Sig Dispense Auth. Provider   sulfamethoxazole -trimethoprim  (BACTRIM  DS) 800-160 MG tablet Take 1 tablet by mouth 2 (two) times daily for 7 days. 14 tablet Deanna, Kyrillos Adams  N, FNP      PDMP not reviewed this encounter.   Deanna Sueann SAILOR, FNP 09/03/24 1920

## 2024-09-03 NOTE — ED Triage Notes (Signed)
 Patient is here for urinary frequency. No dysuria noted. Concerned for possible STI or BV & requests testing. Some abd pain (at times) noted. Stools normal.

## 2024-09-04 ENCOUNTER — Ambulatory Visit (HOSPITAL_COMMUNITY): Payer: Self-pay

## 2024-09-04 LAB — URINE CULTURE: Culture: <10000 — AB

## 2024-09-04 LAB — CERVICOVAGINAL ANCILLARY ONLY
Bacterial Vaginitis (gardnerella): POSITIVE — AB
Candida Glabrata: NEGATIVE
Candida Vaginitis: NEGATIVE
Chlamydia: NEGATIVE
Comment: NEGATIVE
Comment: NEGATIVE
Comment: NEGATIVE
Comment: NEGATIVE
Comment: NEGATIVE
Comment: NORMAL
Neisseria Gonorrhea: NEGATIVE
Trichomonas: NEGATIVE

## 2024-09-04 LAB — SYPHILIS: RPR W/REFLEX TO RPR TITER AND TREPONEMAL ANTIBODIES, TRADITIONAL SCREENING AND DIAGNOSIS ALGORITHM: RPR Ser Ql: NONREACTIVE

## 2024-09-04 LAB — HIV ANTIBODY (ROUTINE TESTING W REFLEX): HIV Screen 4th Generation wRfx: NONREACTIVE

## 2024-09-04 MED ORDER — METRONIDAZOLE 500 MG PO TABS: 500.0000 mg | ORAL_TABLET | Freq: Two times a day (BID) | ORAL | 0 refills | Status: AC

## 2024-09-04 MED ORDER — METRONIDAZOLE 0.75 % VA GEL: 1.0000 | Freq: Every day | VAGINAL | 0 refills | Status: AC

## 2024-10-09 ENCOUNTER — Ambulatory Visit: Payer: Self-pay

## 2024-10-09 NOTE — Telephone Encounter (Signed)
Noted patient has upcoming appointment. 

## 2024-10-09 NOTE — Telephone Encounter (Signed)
 FYI Only or Action Required?: FYI only for provider: appointment scheduled on 10/19/24.  Patient was last seen in primary care on 01/18/2024 by Deanna Bascom RAMAN, NP.  Called Nurse Triage reporting Abdominal Pain.  Symptoms began several weeks ago.  Interventions attempted: Prescription medications: zofran .  Symptoms are: stable.  Triage Disposition: See PCP Within 2 Weeks  Patient/caregiver understands and will follow disposition?: Yes  Copied from CRM [id number]. Topic: Clinical - Red Word Triage >> Oct 09, 2024  2:28 PM Rosaria BRAVO wrote: Red Word that prompted transfer to Nurse Triage: Abdominal pain, nausea Reason for Disposition  Abdominal pain is a chronic symptom (recurrent or ongoing AND present > 4 weeks)    Most recently for 2 weeks, has seen gastro and ED for these symptoms  Answer Assessment - Initial Assessment Questions 1. LOCATION: Where does it hurt?      RLQ (pt does not have her appendix )  2. RADIATION: Does the pain shoot anywhere else? (e.g., chest, back)     Sometimes radiates to LLQ  3. ONSET: When did the pain begin? (e.g., minutes, hours or days ago)      2 weeks  4. SUDDEN: Gradual or sudden onset?     Gradual  5. PATTERN Does the pain come and go, or is it constant?     Intermittent  6. SEVERITY: How bad is the pain?  (e.g., Scale 1-10; mild, moderate, or severe)     4/10  7. RECURRENT SYMPTOM: Have you ever had this type of stomach pain before? If Yes, ask: When was the last time? and What happened that time?      Yes  8. CAUSE: What do you think is causing the stomach pain? (e.g., gallstones, recent abdominal surgery)     Pt unsure  9. RELIEVING/AGGRAVATING FACTORS: What makes it better or worse? (e.g., antacids, bending or twisting motion, bowel movement)     Zofran  is helping  10. OTHER SYMPTOMS: Do you have any other symptoms? (e.g., back pain, diarrhea, fever, urination pain, vomiting)       Nausea  11.  PREGNANCY: Is there any chance you are pregnant? When was your last menstrual period?  Denies  Has had an appendectomy in the past.  Protocols used: Abdominal Pain - [facility]

## 2024-10-17 ENCOUNTER — Telehealth: Payer: Self-pay | Admitting: Nurse Practitioner

## 2024-10-17 NOTE — Telephone Encounter (Signed)
 Contacted pt left vm to confirmed appt (per vr)

## 2024-10-19 ENCOUNTER — Encounter: Payer: Self-pay | Admitting: Nurse Practitioner

## 2024-10-19 ENCOUNTER — Ambulatory Visit: Payer: Self-pay | Admitting: *Deleted

## 2024-10-19 ENCOUNTER — Ambulatory Visit: Admitting: Nurse Practitioner

## 2024-10-19 VITALS — BP 131/63 | HR 89 | Wt 249.0 lb

## 2024-10-19 DIAGNOSIS — N898 Other specified noninflammatory disorders of vagina: Secondary | ICD-10-CM

## 2024-10-19 DIAGNOSIS — B9689 Other specified bacterial agents as the cause of diseases classified elsewhere: Secondary | ICD-10-CM | POA: Diagnosis not present

## 2024-10-19 DIAGNOSIS — R102 Pelvic and perineal pain unspecified side: Secondary | ICD-10-CM | POA: Diagnosis not present

## 2024-10-19 DIAGNOSIS — I959 Hypotension, unspecified: Secondary | ICD-10-CM | POA: Diagnosis not present

## 2024-10-19 DIAGNOSIS — N760 Acute vaginitis: Secondary | ICD-10-CM | POA: Diagnosis not present

## 2024-10-19 NOTE — Assessment & Plan Note (Signed)
 Recurrent BV despite treatment. . Discussed possible  causes of recurrent BV including inadequate treatment, antimicrobial resistance.  She prefers metronidazole  gel  to pills.  If test comes back  positive will treat with clindamycin  2% cream  - Discussed lifestyle modifications to reduce BV risk. Also recommended following up with gynecologist

## 2024-10-19 NOTE — Assessment & Plan Note (Addendum)
 She reports hypotension, diastolic Bp was initially low  repeat bp normal  Encouraged to stay well-hydrated     10/19/2024    2:56 PM 10/19/2024    2:07 PM 09/03/2024    6:35 PM 09/03/2024    6:33 PM 08/10/2024    7:03 PM 08/10/2024    7:01 PM 05/23/2024   10:01 AM  BP/Weight  Systolic BP 131 112 106  127  863  Diastolic BP 63 55 71  90  66  Wt. (Lbs)  249  253  246   BMI  40.19 kg/m2  40.84 kg/m2  39.71 kg/m2

## 2024-10-19 NOTE — Assessment & Plan Note (Signed)
 Pelvic pain possibly related to right ovarian cyst Chronic right-sided pelvic pain, likely linked to right ovarian cyst. Previous CT confirmed cyst. - Referred to gynecologist for further evaluation.

## 2024-10-19 NOTE — Patient Instructions (Signed)
 1. Vaginal discharge (Primary) - NuSwab Vaginitis Plus (VG+)  2. Pelvic pain    I am treating you for bacterial vaginosis.  This is NOT a sexually transmitted disease.  Bacterial vaginosis is an overgrowth of normal vaginal bacteria.  I have prescribed Metronidazole  500mg  for you to take 2 times a day for the next week.  DO NOT drink alcohol while taking this medication or you will become extremely ill.  Healthy vaginal hygiene practices   - Avoid sleeper pajamas. Nightgowns allow air to circulate.  Sleep without underpants whenever possible.  - Wear cotton underpants during the day. Double-rinse underwear after washing to avoid residual irritants. Do not use fabric softeners for underwear and swimsuits.  - Avoid tights, leotards, leggings, skinny jeans, and other tight-fitting clothing. Skirts and loose-fitting pants allow air to circulate.  - Avoid pantyliners.  Instead use tampons or cotton pads.  - Daily warm bathing is helpful:      - Use soap to wash regions other than the genital area just before getting out of the tub. Limit use of any soap on genital areas. Use fragance-free soaps.     - Rinse the genital area well and gently pat dry.  Don't rub.       - Do not use bubble baths or perfumed soaps.  - Do not use any feminine sprays, douches or powders.  These contain chemicals that will irritate the skin.  - If the genital area is tender or swollen, cool compresses may relieve the discomfort. Unscented wet wipes can be used instead of toilet paper for wiping.   - Emollients, such as Vaseline, may help protect skin and can be applied to the irritated area.  - Always remember to wipe front-to-back after bowel movements. Pat dry after urination.  - Do not sit in wet swimsuits for long periods of time after swimming

## 2024-10-19 NOTE — Progress Notes (Signed)
 "  Acute Office Visit  Subjective:     Patient ID: Deanna Myers, female    DOB: [birth year], 34 y.o.   MRN: [id number]  Chief Complaint  Patient presents with   RLQ pain    HPI  Discussed the use of AI scribe software for clinical note transcription with the patient, who gave verbal consent to proceed.  History of Present Illness Deanna Myers is a 34 year old female who presents with persistent lower abdominal and pelvic pain.  She experiences persistent lower abdominal and pelvic pain, primarily on the right side, which occasionally shifts to the left. The pain is almost constant and has been present for several months. She recalls being diagnosed with a right ovarian cyst last year, which was considered small. Despite this, she continues to experience significant discomfort in the pelvic and lower abdominal region.  She has a history of irritable bowel syndrome. Gastroparesis was ruled out by a gastroenterologist. She was previously prescribed medication for nausea, which provided relief until she ran out. She attempted a water fast to manage her symptoms. She experiences episodes of nausea but denies current nausea, vomiting, or constipation. She was impacted last year and has been using Milk of Magnesia as needed, but reports regular bowel movements recently.  Her menstrual cycles are normal without heavy bleeding. She was recently tested for bacterial vaginosis and treated with a vaginal gel. She is concerned about recurrent bacterial vaginosis despite not having multiple sexual partners or engaging in activities that typically increase risk. States that she is not pregnant presently and has been having regular menses  She mentions having naturally low blood pressure and experiences dizziness upon standing. She exercises regularly and is concerned about her overall health, seeking a comprehensive evaluation including blood work and hormone levels.     Assessment & Plan             Review of Systems  Constitutional:  Negative for chills, diaphoresis, fatigue and fever.  Respiratory:  Negative for cough, shortness of breath and wheezing.   Cardiovascular:  Negative for chest pain, palpitations and leg swelling.  Gastrointestinal:  Positive for abdominal pain and nausea. Negative for blood in stool, constipation and diarrhea.  Genitourinary:  Positive for pelvic pain and vaginal discharge. Negative for hematuria and menstrual problem.  Psychiatric/Behavioral:  The patient is nervous/anxious.         Objective:    BP 131/63   Pulse 89   Wt 249 lb (112.9 kg)   LMP 12/19/2023   SpO2 100%   BMI 40.19 kg/m    Physical Exam Vitals and nursing note reviewed.  Constitutional:      General: She is not in acute distress.    Appearance: Normal appearance. She is obese. She is not ill-appearing, toxic-appearing or diaphoretic.  Eyes:     General: No scleral icterus.       Right eye: No discharge.        Left eye: No discharge.     Extraocular Movements: Extraocular movements intact.     Conjunctiva/sclera: Conjunctivae normal.  Cardiovascular:     Rate and Rhythm: Normal rate and regular rhythm.     Pulses: Normal pulses.     Heart sounds: Normal heart sounds. No murmur heard.    No friction rub. No gallop.  Pulmonary:     Effort: Pulmonary effort is normal. No respiratory distress.     Breath sounds: Normal breath sounds. No stridor. No wheezing, rhonchi or rales.  Chest:     Chest wall: No tenderness.  Abdominal:     General: There is no distension.     Palpations: Abdomen is soft.     Tenderness: There is abdominal tenderness. There is no right CVA tenderness, left CVA tenderness or guarding.     Comments: RLQ  Musculoskeletal:        General: No swelling, tenderness, deformity or signs of injury.     Right lower leg: No edema.     Left lower leg: No edema.  Skin:    General: Skin is warm and dry.     Capillary Refill: Capillary  refill takes less than 2 seconds.     Coloration: Skin is not jaundiced or pale.     Findings: No bruising, erythema or lesion.  Neurological:     Mental Status: She is alert and oriented to person, place, and time.     Motor: No weakness.     Coordination: Coordination normal.     Gait: Gait normal.  Psychiatric:        Mood and Affect: Mood normal.        Behavior: Behavior normal.        Thought Content: Thought content normal.        Judgment: Judgment normal.     No results found for any visits on 10/19/24.      Assessment & Plan:   Problem List Items Addressed This Visit       Cardiovascular and Mediastinum   Hypotension   She reports hypotension, diastolic Bp was initially low  repeat bp normal  Encouraged to stay well-hydrated     10/19/2024    2:56 PM 10/19/2024    2:07 PM 09/03/2024    6:35 PM 09/03/2024    6:33 PM 08/10/2024    7:03 PM 08/10/2024    7:01 PM 05/23/2024   10:01 AM  BP/Weight  Systolic BP 131 112 106  127  863  Diastolic BP 63 55 71  90  66  Wt. (Lbs)  249  253  246   BMI  40.19 kg/m2  40.84 kg/m2  39.71 kg/m2              Genitourinary   Bacterial vaginosis   Recurrent BV despite treatment. . Discussed possible  causes of recurrent BV including inadequate treatment, antimicrobial resistance.  She prefers metronidazole  gel  to pills.  If test comes back  positive will treat with clindamycin  2% cream  - Discussed lifestyle modifications to reduce BV risk. Also recommended following up with gynecologist           Other   Vaginal discharge   Relevant Orders   NuSwab Vaginitis Plus (VG+)   Pelvic pain - Primary   Pelvic pain possibly related to right ovarian cyst Chronic right-sided pelvic pain, likely linked to right ovarian cyst. Previous CT confirmed cyst. - Referred to gynecologist for further evaluation.        No orders of the defined types were placed in this encounter.   Return in about 2 months (around 12/17/2024) for  CPE, PAP smear .  Shadrick Senne R Nijah Tejera, FNP  "

## 2024-10-22 ENCOUNTER — Ambulatory Visit: Payer: Self-pay | Admitting: Nurse Practitioner

## 2024-10-22 DIAGNOSIS — B9689 Other specified bacterial agents as the cause of diseases classified elsewhere: Secondary | ICD-10-CM

## 2024-10-22 LAB — NUSWAB VAGINITIS PLUS (VG+)
Atopobium vaginae: HIGH {score} — AB
BVAB 2: HIGH {score} — AB
Candida albicans, NAA: NEGATIVE
Candida glabrata, NAA: NEGATIVE
Chlamydia trachomatis, NAA: NEGATIVE
Megasphaera 1: HIGH {score} — AB
Neisseria gonorrhoeae, NAA: NEGATIVE
Trich vag by NAA: NEGATIVE

## 2024-10-22 MED ORDER — METRONIDAZOLE 0.75 % VA GEL: 1.0000 | Freq: Every day | VAGINAL | 1 refills | Status: AC

## 2024-11-23 ENCOUNTER — Ambulatory Visit: Admitting: Neurology

## 2025-01-02 ENCOUNTER — Encounter: Payer: Self-pay | Admitting: Nurse Practitioner
